# Patient Record
Sex: Female | Born: 1965 | Race: Black or African American | Hispanic: No | Marital: Single | Smoking: Never smoker
Health system: Southern US, Community
[De-identification: ages and names within clinical notes are randomized; demographics above are authoritative.]

## PROBLEM LIST (undated history)

## (undated) DIAGNOSIS — Z9289 Personal history of other medical treatment: Secondary | ICD-10-CM

## (undated) DIAGNOSIS — G4730 Sleep apnea, unspecified: Secondary | ICD-10-CM

## (undated) DIAGNOSIS — M1990 Unspecified osteoarthritis, unspecified site: Secondary | ICD-10-CM

## (undated) DIAGNOSIS — R55 Syncope and collapse: Secondary | ICD-10-CM

## (undated) DIAGNOSIS — K219 Gastro-esophageal reflux disease without esophagitis: Secondary | ICD-10-CM

## (undated) DIAGNOSIS — I10 Essential (primary) hypertension: Secondary | ICD-10-CM

## (undated) DIAGNOSIS — J45909 Unspecified asthma, uncomplicated: Secondary | ICD-10-CM

## (undated) HISTORY — DX: Unspecified osteoarthritis, unspecified site: M19.90

## (undated) HISTORY — PX: CARDIAC CATHETERIZATION: SHX172

## (undated) HISTORY — DX: Syncope and collapse: R55

## (undated) HISTORY — PX: CHOLECYSTECTOMY: SHX55

---

## 1999-01-26 ENCOUNTER — Emergency Department (HOSPITAL_COMMUNITY): Admission: EM | Admit: 1999-01-26 | Discharge: 1999-01-26 | Payer: Self-pay | Admitting: Emergency Medicine

## 1999-10-11 HISTORY — PX: VAGINAL HYSTERECTOMY: SUR661

## 2000-09-13 ENCOUNTER — Encounter: Payer: Self-pay | Admitting: Internal Medicine

## 2000-09-13 ENCOUNTER — Encounter: Admission: RE | Admit: 2000-09-13 | Discharge: 2000-09-13 | Payer: Self-pay | Admitting: Internal Medicine

## 2000-09-22 ENCOUNTER — Encounter (HOSPITAL_BASED_OUTPATIENT_CLINIC_OR_DEPARTMENT_OTHER): Payer: Self-pay | Admitting: General Surgery

## 2000-09-25 ENCOUNTER — Encounter (INDEPENDENT_AMBULATORY_CARE_PROVIDER_SITE_OTHER): Payer: Self-pay | Admitting: *Deleted

## 2000-09-25 ENCOUNTER — Encounter (HOSPITAL_BASED_OUTPATIENT_CLINIC_OR_DEPARTMENT_OTHER): Payer: Self-pay | Admitting: General Surgery

## 2000-09-25 ENCOUNTER — Ambulatory Visit (HOSPITAL_COMMUNITY): Admission: RE | Admit: 2000-09-25 | Discharge: 2000-09-26 | Payer: Self-pay | Admitting: General Surgery

## 2000-11-06 ENCOUNTER — Ambulatory Visit (HOSPITAL_COMMUNITY): Admission: RE | Admit: 2000-11-06 | Discharge: 2000-11-06 | Payer: Self-pay | Admitting: Internal Medicine

## 2000-12-15 ENCOUNTER — Encounter: Admission: RE | Admit: 2000-12-15 | Discharge: 2001-03-15 | Payer: Self-pay | Admitting: Internal Medicine

## 2001-07-04 ENCOUNTER — Emergency Department (HOSPITAL_COMMUNITY): Admission: EM | Admit: 2001-07-04 | Discharge: 2001-07-04 | Payer: Self-pay

## 2001-07-04 ENCOUNTER — Ambulatory Visit (HOSPITAL_COMMUNITY): Admission: RE | Admit: 2001-07-04 | Discharge: 2001-07-04 | Payer: Self-pay | Admitting: Internal Medicine

## 2001-07-04 ENCOUNTER — Encounter: Payer: Self-pay | Admitting: Internal Medicine

## 2001-07-06 ENCOUNTER — Encounter: Payer: Self-pay | Admitting: Internal Medicine

## 2001-07-06 ENCOUNTER — Ambulatory Visit (HOSPITAL_COMMUNITY): Admission: RE | Admit: 2001-07-06 | Discharge: 2001-07-06 | Payer: Self-pay | Admitting: Internal Medicine

## 2001-07-27 ENCOUNTER — Encounter: Payer: Self-pay | Admitting: Obstetrics and Gynecology

## 2001-07-27 ENCOUNTER — Ambulatory Visit (HOSPITAL_COMMUNITY): Admission: RE | Admit: 2001-07-27 | Discharge: 2001-07-27 | Payer: Self-pay | Admitting: Obstetrics and Gynecology

## 2001-09-09 HISTORY — PX: EXPLORATORY LAPAROTOMY: SUR591

## 2001-09-21 ENCOUNTER — Inpatient Hospital Stay (HOSPITAL_COMMUNITY): Admission: AD | Admit: 2001-09-21 | Discharge: 2001-09-26 | Payer: Self-pay | Admitting: Obstetrics and Gynecology

## 2001-09-21 ENCOUNTER — Encounter (INDEPENDENT_AMBULATORY_CARE_PROVIDER_SITE_OTHER): Payer: Self-pay

## 2001-10-06 ENCOUNTER — Emergency Department (HOSPITAL_COMMUNITY): Admission: EM | Admit: 2001-10-06 | Discharge: 2001-10-06 | Payer: Self-pay | Admitting: Emergency Medicine

## 2001-11-19 ENCOUNTER — Other Ambulatory Visit: Admission: RE | Admit: 2001-11-19 | Discharge: 2001-11-19 | Payer: Self-pay | Admitting: Obstetrics and Gynecology

## 2001-12-26 ENCOUNTER — Encounter: Payer: Self-pay | Admitting: General Surgery

## 2001-12-31 ENCOUNTER — Ambulatory Visit (HOSPITAL_COMMUNITY): Admission: RE | Admit: 2001-12-31 | Discharge: 2001-12-31 | Payer: Self-pay | Admitting: General Surgery

## 2002-02-05 ENCOUNTER — Ambulatory Visit (HOSPITAL_COMMUNITY): Admission: RE | Admit: 2002-02-05 | Discharge: 2002-02-05 | Payer: Self-pay | Admitting: Internal Medicine

## 2002-02-19 ENCOUNTER — Ambulatory Visit (HOSPITAL_COMMUNITY): Admission: RE | Admit: 2002-02-19 | Discharge: 2002-02-19 | Payer: Self-pay | Admitting: Obstetrics and Gynecology

## 2002-02-19 ENCOUNTER — Encounter: Payer: Self-pay | Admitting: Obstetrics and Gynecology

## 2002-04-05 ENCOUNTER — Encounter: Payer: Self-pay | Admitting: Obstetrics and Gynecology

## 2002-04-05 ENCOUNTER — Ambulatory Visit (HOSPITAL_COMMUNITY): Admission: RE | Admit: 2002-04-05 | Discharge: 2002-04-05 | Payer: Self-pay | Admitting: Obstetrics and Gynecology

## 2002-04-19 ENCOUNTER — Emergency Department (HOSPITAL_COMMUNITY): Admission: EM | Admit: 2002-04-19 | Discharge: 2002-04-19 | Payer: Self-pay | Admitting: Emergency Medicine

## 2002-05-16 ENCOUNTER — Encounter: Payer: Self-pay | Admitting: Obstetrics and Gynecology

## 2002-05-16 ENCOUNTER — Ambulatory Visit (HOSPITAL_COMMUNITY): Admission: RE | Admit: 2002-05-16 | Discharge: 2002-05-16 | Payer: Self-pay | Admitting: Obstetrics and Gynecology

## 2002-05-17 ENCOUNTER — Encounter: Payer: Self-pay | Admitting: Emergency Medicine

## 2002-05-17 ENCOUNTER — Emergency Department (HOSPITAL_COMMUNITY): Admission: EM | Admit: 2002-05-17 | Discharge: 2002-05-17 | Payer: Self-pay | Admitting: Emergency Medicine

## 2004-07-29 ENCOUNTER — Ambulatory Visit: Payer: Self-pay | Admitting: Internal Medicine

## 2004-09-09 ENCOUNTER — Ambulatory Visit: Payer: Self-pay | Admitting: Internal Medicine

## 2005-01-27 ENCOUNTER — Inpatient Hospital Stay: Payer: Self-pay | Admitting: Internal Medicine

## 2005-04-14 ENCOUNTER — Emergency Department: Payer: Self-pay | Admitting: Emergency Medicine

## 2005-04-27 ENCOUNTER — Ambulatory Visit: Payer: Self-pay | Admitting: Family Medicine

## 2005-08-12 ENCOUNTER — Ambulatory Visit: Payer: Self-pay | Admitting: Family Medicine

## 2005-09-15 ENCOUNTER — Ambulatory Visit: Payer: Self-pay | Admitting: Unknown Physician Specialty

## 2005-09-15 ENCOUNTER — Encounter: Admission: RE | Admit: 2005-09-15 | Discharge: 2005-09-15 | Payer: Self-pay | Admitting: General Surgery

## 2005-09-16 ENCOUNTER — Ambulatory Visit (HOSPITAL_COMMUNITY): Admission: RE | Admit: 2005-09-16 | Discharge: 2005-09-17 | Payer: Self-pay | Admitting: General Surgery

## 2005-09-16 ENCOUNTER — Ambulatory Visit (HOSPITAL_BASED_OUTPATIENT_CLINIC_OR_DEPARTMENT_OTHER): Admission: RE | Admit: 2005-09-16 | Discharge: 2005-09-16 | Payer: Self-pay | Admitting: General Surgery

## 2005-09-16 ENCOUNTER — Ambulatory Visit (HOSPITAL_COMMUNITY): Admission: RE | Admit: 2005-09-16 | Discharge: 2005-09-16 | Payer: Self-pay | Admitting: General Surgery

## 2006-04-08 ENCOUNTER — Ambulatory Visit: Payer: Self-pay

## 2006-05-11 ENCOUNTER — Ambulatory Visit: Payer: Self-pay | Admitting: General Surgery

## 2006-07-13 ENCOUNTER — Ambulatory Visit: Payer: Self-pay | Admitting: Orthopedic Surgery

## 2006-08-08 ENCOUNTER — Other Ambulatory Visit: Payer: Self-pay

## 2006-08-15 ENCOUNTER — Ambulatory Visit: Payer: Self-pay | Admitting: General Surgery

## 2006-11-08 ENCOUNTER — Emergency Department: Payer: Self-pay | Admitting: Emergency Medicine

## 2006-11-08 ENCOUNTER — Other Ambulatory Visit: Payer: Self-pay

## 2007-01-06 ENCOUNTER — Emergency Department: Payer: Self-pay | Admitting: General Practice

## 2007-02-24 ENCOUNTER — Emergency Department: Payer: Self-pay | Admitting: Emergency Medicine

## 2007-02-24 ENCOUNTER — Other Ambulatory Visit: Payer: Self-pay

## 2007-02-28 ENCOUNTER — Other Ambulatory Visit: Payer: Self-pay

## 2007-02-28 ENCOUNTER — Emergency Department: Payer: Self-pay | Admitting: Emergency Medicine

## 2007-06-12 ENCOUNTER — Ambulatory Visit: Payer: Self-pay | Admitting: Family Medicine

## 2007-06-25 ENCOUNTER — Emergency Department: Payer: Self-pay | Admitting: Emergency Medicine

## 2007-07-28 ENCOUNTER — Emergency Department: Payer: Self-pay | Admitting: Emergency Medicine

## 2007-12-25 ENCOUNTER — Emergency Department: Payer: Self-pay | Admitting: Emergency Medicine

## 2008-01-16 ENCOUNTER — Other Ambulatory Visit: Payer: Self-pay

## 2008-01-16 ENCOUNTER — Emergency Department: Payer: Self-pay | Admitting: Emergency Medicine

## 2008-02-26 ENCOUNTER — Other Ambulatory Visit: Payer: Self-pay

## 2008-02-26 ENCOUNTER — Emergency Department: Payer: Self-pay | Admitting: Emergency Medicine

## 2008-05-10 ENCOUNTER — Emergency Department: Payer: Self-pay | Admitting: Emergency Medicine

## 2008-06-02 ENCOUNTER — Emergency Department: Payer: Self-pay | Admitting: Emergency Medicine

## 2008-09-16 ENCOUNTER — Ambulatory Visit: Payer: Self-pay | Admitting: General Surgery

## 2008-10-16 ENCOUNTER — Inpatient Hospital Stay (HOSPITAL_COMMUNITY): Admission: EM | Admit: 2008-10-16 | Discharge: 2008-10-21 | Payer: Self-pay | Admitting: Emergency Medicine

## 2008-10-16 ENCOUNTER — Encounter (INDEPENDENT_AMBULATORY_CARE_PROVIDER_SITE_OTHER): Payer: Self-pay | Admitting: Surgery

## 2009-01-14 ENCOUNTER — Observation Stay: Payer: Self-pay | Admitting: Internal Medicine

## 2009-03-05 ENCOUNTER — Observation Stay: Payer: Self-pay | Admitting: Internal Medicine

## 2009-03-25 ENCOUNTER — Emergency Department: Payer: Self-pay | Admitting: Emergency Medicine

## 2009-03-29 ENCOUNTER — Emergency Department (HOSPITAL_COMMUNITY): Admission: EM | Admit: 2009-03-29 | Discharge: 2009-03-30 | Payer: Self-pay | Admitting: Emergency Medicine

## 2009-12-19 ENCOUNTER — Emergency Department (HOSPITAL_COMMUNITY): Admission: EM | Admit: 2009-12-19 | Discharge: 2009-12-19 | Payer: Self-pay | Admitting: Emergency Medicine

## 2010-02-03 ENCOUNTER — Emergency Department: Payer: Self-pay | Admitting: Emergency Medicine

## 2010-02-23 ENCOUNTER — Emergency Department: Payer: Self-pay | Admitting: Emergency Medicine

## 2010-03-22 ENCOUNTER — Ambulatory Visit: Payer: Self-pay | Admitting: Family Medicine

## 2010-03-23 ENCOUNTER — Ambulatory Visit: Payer: Self-pay | Admitting: Internal Medicine

## 2010-04-18 ENCOUNTER — Emergency Department (HOSPITAL_COMMUNITY): Admission: EM | Admit: 2010-04-18 | Discharge: 2010-04-18 | Payer: Self-pay | Admitting: Emergency Medicine

## 2010-05-24 ENCOUNTER — Emergency Department: Payer: Self-pay | Admitting: Emergency Medicine

## 2010-07-12 ENCOUNTER — Emergency Department: Payer: Self-pay | Admitting: Emergency Medicine

## 2010-07-14 ENCOUNTER — Emergency Department (HOSPITAL_COMMUNITY): Admission: EM | Admit: 2010-07-14 | Discharge: 2010-07-15 | Payer: Self-pay | Admitting: Emergency Medicine

## 2010-09-18 ENCOUNTER — Emergency Department: Payer: Self-pay | Admitting: Unknown Physician Specialty

## 2010-10-18 ENCOUNTER — Ambulatory Visit: Payer: Self-pay | Admitting: Internal Medicine

## 2010-10-26 ENCOUNTER — Emergency Department: Payer: Self-pay | Admitting: Internal Medicine

## 2010-10-27 ENCOUNTER — Emergency Department: Payer: Self-pay | Admitting: Emergency Medicine

## 2010-11-19 ENCOUNTER — Emergency Department: Payer: Self-pay | Admitting: Emergency Medicine

## 2010-12-22 LAB — COMPREHENSIVE METABOLIC PANEL
ALT: 23 U/L (ref 0–35)
AST: 19 U/L (ref 0–37)
Albumin: 3.5 g/dL (ref 3.5–5.2)
Alkaline Phosphatase: 64 U/L (ref 39–117)
BUN: 8 mg/dL (ref 6–23)
CO2: 28 mEq/L (ref 19–32)
Calcium: 9.3 mg/dL (ref 8.4–10.5)
Chloride: 112 mEq/L (ref 96–112)
Creatinine, Ser: 0.6 mg/dL (ref 0.4–1.2)
GFR calc Af Amer: >60 mL/min (ref >60)
GFR calc non Af Amer: >60 mL/min (ref >60)
Glucose, Bld: 85 mg/dL (ref 70–99)
Potassium: 3.2 mEq/L — ABNORMAL LOW (ref 3.5–5.1)
Sodium: 145 mEq/L (ref 135–145)
Total Bilirubin: 0.8 mg/dL (ref 0.3–1.2)
Total Protein: 6.5 g/dL (ref 6.0–8.3)

## 2010-12-22 LAB — URINE CULTURE
Colony Count: >=100000
Culture  Setup Time: 201110060159

## 2010-12-22 LAB — URINALYSIS, ROUTINE W REFLEX MICROSCOPIC
Bilirubin Urine: NEGATIVE
Glucose, UA: NEGATIVE mg/dL
Hgb urine dipstick: NEGATIVE
Ketones, ur: NEGATIVE mg/dL
Nitrite: NEGATIVE
Protein, ur: NEGATIVE mg/dL
Specific Gravity, Urine: 1.016 (ref 1.005–1.030)
Urobilinogen, UA: 1 mg/dL (ref 0.0–1.0)
pH: 6.5 (ref 5.0–8.0)

## 2010-12-22 LAB — DIFFERENTIAL
Basophils Absolute: 0 10*3/uL (ref 0.0–0.1)
Basophils Relative: 0 % (ref 0–1)
Eosinophils Absolute: 0 10*3/uL (ref 0.0–0.7)
Eosinophils Relative: 1 % (ref 0–5)
Lymphocytes Relative: 30 % (ref 12–46)
Lymphs Abs: 1.8 10*3/uL (ref 0.7–4.0)
Monocytes Absolute: 0.2 10*3/uL (ref 0.1–1.0)
Monocytes Relative: 3 % (ref 3–12)
Neutro Abs: 4 10*3/uL (ref 1.7–7.7)
Neutrophils Relative %: 66 % (ref 43–77)

## 2010-12-22 LAB — CBC
HCT: 36.6 % (ref 36.0–46.0)
Hemoglobin: 12.3 g/dL (ref 12.0–15.0)
MCH: 27.8 pg (ref 26.0–34.0)
MCHC: 33.6 g/dL (ref 30.0–36.0)
MCV: 82.8 fL (ref 78.0–100.0)
Platelets: 288 10*3/uL (ref 150–400)
RBC: 4.42 MIL/uL (ref 3.87–5.11)
RDW: 14.3 % (ref 11.5–15.5)
WBC: 6 10*3/uL (ref 4.0–10.5)

## 2010-12-22 LAB — LIPASE, BLOOD: Lipase: 20 U/L (ref 11–59)

## 2010-12-31 LAB — DIFFERENTIAL
Basophils Absolute: 0 10*3/uL (ref 0.0–0.1)
Basophils Relative: 0 % (ref 0–1)
Eosinophils Absolute: 0.1 10*3/uL (ref 0.0–0.7)
Eosinophils Relative: 1 % (ref 0–5)
Lymphocytes Relative: 24 % (ref 12–46)
Lymphs Abs: 1.4 10*3/uL (ref 0.7–4.0)
Monocytes Absolute: 0.4 10*3/uL (ref 0.1–1.0)
Monocytes Relative: 7 % (ref 3–12)
Neutro Abs: 3.9 10*3/uL (ref 1.7–7.7)
Neutrophils Relative %: 67 % (ref 43–77)

## 2010-12-31 LAB — COMPREHENSIVE METABOLIC PANEL
ALT: 21 U/L (ref 0–35)
AST: 17 U/L (ref 0–37)
Albumin: 3.7 g/dL (ref 3.5–5.2)
Alkaline Phosphatase: 75 U/L (ref 39–117)
BUN: 9 mg/dL (ref 6–23)
CO2: 26 mEq/L (ref 19–32)
Calcium: 9.3 mg/dL (ref 8.4–10.5)
Chloride: 107 mEq/L (ref 96–112)
Creatinine, Ser: 0.56 mg/dL (ref 0.4–1.2)
GFR calc Af Amer: >60 mL/min (ref >60)
GFR calc non Af Amer: >60 mL/min (ref >60)
Glucose, Bld: 81 mg/dL (ref 70–99)
Potassium: 3.3 mEq/L — ABNORMAL LOW (ref 3.5–5.1)
Sodium: 139 mEq/L (ref 135–145)
Total Bilirubin: 0.9 mg/dL (ref 0.3–1.2)
Total Protein: 7.1 g/dL (ref 6.0–8.3)

## 2010-12-31 LAB — CBC
HCT: 38.4 % (ref 36.0–46.0)
Hemoglobin: 12.9 g/dL (ref 12.0–15.0)
MCHC: 33.6 g/dL (ref 30.0–36.0)
MCV: 84 fL (ref 78.0–100.0)
Platelets: 269 10*3/uL (ref 150–400)
RBC: 4.57 MIL/uL (ref 3.87–5.11)
RDW: 13.7 % (ref 11.5–15.5)
WBC: 5.8 10*3/uL (ref 4.0–10.5)

## 2010-12-31 LAB — URINALYSIS, ROUTINE W REFLEX MICROSCOPIC
Bilirubin Urine: NEGATIVE
Glucose, UA: NEGATIVE mg/dL
Hgb urine dipstick: NEGATIVE
Ketones, ur: NEGATIVE mg/dL
Nitrite: NEGATIVE
Protein, ur: NEGATIVE mg/dL
Specific Gravity, Urine: 1.026 (ref 1.005–1.030)
Urobilinogen, UA: 0.2 mg/dL (ref 0.0–1.0)
pH: 5.5 (ref 5.0–8.0)

## 2011-01-17 LAB — COMPREHENSIVE METABOLIC PANEL
ALT: 20 U/L (ref 0–35)
AST: 17 U/L (ref 0–37)
Albumin: 3.5 g/dL (ref 3.5–5.2)
Alkaline Phosphatase: 59 U/L (ref 39–117)
BUN: 10 mg/dL (ref 6–23)
CO2: 26 mEq/L (ref 19–32)
Calcium: 8.9 mg/dL (ref 8.4–10.5)
Chloride: 108 mEq/L (ref 96–112)
Creatinine, Ser: 0.52 mg/dL (ref 0.4–1.2)
GFR calc Af Amer: >60 mL/min (ref >60)
GFR calc non Af Amer: >60 mL/min (ref >60)
Glucose, Bld: 101 mg/dL — ABNORMAL HIGH (ref 70–99)
Potassium: 2.8 mEq/L — ABNORMAL LOW (ref 3.5–5.1)
Sodium: 140 mEq/L (ref 135–145)
Total Bilirubin: 0.5 mg/dL (ref 0.3–1.2)
Total Protein: 6.6 g/dL (ref 6.0–8.3)

## 2011-01-17 LAB — POCT I-STAT, CHEM 8
BUN: 11 mg/dL (ref 6–23)
Calcium, Ion: 1.22 mmol/L (ref 1.12–1.32)
Chloride: 107 mEq/L (ref 96–112)
Creatinine, Ser: 0.7 mg/dL (ref 0.4–1.2)
Glucose, Bld: 98 mg/dL (ref 70–99)
HCT: 36 % (ref 36.0–46.0)
Hemoglobin: 12.2 g/dL (ref 12.0–15.0)
Potassium: 3 mEq/L — ABNORMAL LOW (ref 3.5–5.1)
Sodium: 144 mEq/L (ref 135–145)
TCO2: 25 mmol/L (ref 0–100)

## 2011-01-17 LAB — URINALYSIS, ROUTINE W REFLEX MICROSCOPIC
Bilirubin Urine: NEGATIVE
Glucose, UA: NEGATIVE mg/dL
Hgb urine dipstick: NEGATIVE
Ketones, ur: NEGATIVE mg/dL
Nitrite: NEGATIVE
Protein, ur: NEGATIVE mg/dL
Specific Gravity, Urine: 1.028 (ref 1.005–1.030)
Urobilinogen, UA: 0.2 mg/dL (ref 0.0–1.0)
pH: 5.5 (ref 5.0–8.0)

## 2011-01-17 LAB — URINE CULTURE
Colony Count: NO GROWTH
Culture: NO GROWTH

## 2011-01-17 LAB — LIPASE, BLOOD: Lipase: 16 U/L (ref 11–59)

## 2011-01-24 LAB — CREATININE, SERUM
Creatinine, Ser: 0.62 mg/dL (ref 0.4–1.2)
GFR calc Af Amer: >60 mL/min (ref >60)
GFR calc non Af Amer: >60 mL/min (ref >60)

## 2011-01-24 LAB — BASIC METABOLIC PANEL
BUN: 3 mg/dL — ABNORMAL LOW (ref 6–23)
CO2: 26 mEq/L (ref 19–32)
Calcium: 8.5 mg/dL (ref 8.4–10.5)
Chloride: 106 mEq/L (ref 96–112)
Creatinine, Ser: 0.45 mg/dL (ref 0.4–1.2)
GFR calc Af Amer: >60 mL/min (ref >60)
GFR calc non Af Amer: >60 mL/min (ref >60)
Glucose, Bld: 96 mg/dL (ref 70–99)
Potassium: 3.6 mEq/L (ref 3.5–5.1)
Sodium: 139 mEq/L (ref 135–145)

## 2011-01-24 LAB — POTASSIUM
Potassium: 3.3 mEq/L — ABNORMAL LOW (ref 3.5–5.1)
Potassium: 3.5 mEq/L (ref 3.5–5.1)

## 2011-01-24 LAB — CBC
HCT: 35.4 % — ABNORMAL LOW (ref 36.0–46.0)
HCT: 39.3 % (ref 36.0–46.0)
HCT: 39.6 % (ref 36.0–46.0)
HCT: 39.7 % (ref 36.0–46.0)
Hemoglobin: 11.7 g/dL — ABNORMAL LOW (ref 12.0–15.0)
Hemoglobin: 13 g/dL (ref 12.0–15.0)
Hemoglobin: 13 g/dL (ref 12.0–15.0)
Hemoglobin: 13 g/dL (ref 12.0–15.0)
MCHC: 32.8 g/dL (ref 30.0–36.0)
MCHC: 32.9 g/dL (ref 30.0–36.0)
MCHC: 33 g/dL (ref 30.0–36.0)
MCHC: 33.1 g/dL (ref 30.0–36.0)
MCV: 81.9 fL (ref 78.0–100.0)
MCV: 82.4 fL (ref 78.0–100.0)
MCV: 82.4 fL (ref 78.0–100.0)
MCV: 82.7 fL (ref 78.0–100.0)
Platelets: 292 10*3/uL (ref 150–400)
Platelets: 306 10*3/uL (ref 150–400)
Platelets: 317 10*3/uL (ref 150–400)
Platelets: 333 10*3/uL (ref 150–400)
RBC: 4.28 MIL/uL (ref 3.87–5.11)
RBC: 4.76 MIL/uL (ref 3.87–5.11)
RBC: 4.82 MIL/uL (ref 3.87–5.11)
RBC: 4.83 MIL/uL (ref 3.87–5.11)
RDW: 14.3 % (ref 11.5–15.5)
RDW: 14.3 % (ref 11.5–15.5)
RDW: 14.5 % (ref 11.5–15.5)
RDW: 15 % (ref 11.5–15.5)
WBC: 10 10*3/uL (ref 4.0–10.5)
WBC: 13.9 10*3/uL — ABNORMAL HIGH (ref 4.0–10.5)
WBC: 6.8 10*3/uL (ref 4.0–10.5)
WBC: 8.4 10*3/uL (ref 4.0–10.5)

## 2011-01-24 LAB — COMPREHENSIVE METABOLIC PANEL
ALT: 14 U/L (ref 0–35)
AST: 13 U/L (ref 0–37)
Albumin: 3.7 g/dL (ref 3.5–5.2)
Alkaline Phosphatase: 75 U/L (ref 39–117)
BUN: 8 mg/dL (ref 6–23)
CO2: 28 mEq/L (ref 19–32)
Calcium: 9.4 mg/dL (ref 8.4–10.5)
Chloride: 110 mEq/L (ref 96–112)
Creatinine, Ser: 0.54 mg/dL (ref 0.4–1.2)
GFR calc Af Amer: >60 mL/min (ref >60)
GFR calc non Af Amer: >60 mL/min (ref >60)
Glucose, Bld: 98 mg/dL (ref 70–99)
Potassium: 2.9 mEq/L — ABNORMAL LOW (ref 3.5–5.1)
Sodium: 145 mEq/L (ref 135–145)
Total Bilirubin: 0.6 mg/dL (ref 0.3–1.2)
Total Protein: 6.7 g/dL (ref 6.0–8.3)

## 2011-01-24 LAB — DIFFERENTIAL
Basophils Absolute: 0 10*3/uL (ref 0.0–0.1)
Basophils Relative: 0 % (ref 0–1)
Eosinophils Absolute: 0.1 10*3/uL (ref 0.0–0.7)
Eosinophils Relative: 1 % (ref 0–5)
Lymphocytes Relative: 24 % (ref 12–46)
Lymphs Abs: 2 10*3/uL (ref 0.7–4.0)
Monocytes Absolute: 0.4 10*3/uL (ref 0.1–1.0)
Monocytes Relative: 5 % (ref 3–12)
Neutro Abs: 5.8 10*3/uL (ref 1.7–7.7)
Neutrophils Relative %: 69 % (ref 43–77)

## 2011-01-24 LAB — URINALYSIS, ROUTINE W REFLEX MICROSCOPIC
Bilirubin Urine: NEGATIVE
Glucose, UA: NEGATIVE mg/dL
Hgb urine dipstick: NEGATIVE
Ketones, ur: NEGATIVE mg/dL
Nitrite: NEGATIVE
Protein, ur: NEGATIVE mg/dL
Specific Gravity, Urine: 1.022 (ref 1.005–1.030)
Urobilinogen, UA: 0.2 mg/dL (ref 0.0–1.0)
pH: 6 (ref 5.0–8.0)

## 2011-01-24 LAB — LIPASE, BLOOD: Lipase: 26 U/L (ref 11–59)

## 2011-02-05 ENCOUNTER — Emergency Department: Payer: Self-pay | Admitting: Internal Medicine

## 2011-02-22 NOTE — Discharge Summary (Signed)
[name], LAUGHERY NO.:  [id number]   MEDICAL RECORD NO.:  [id number]          PATIENT TYPE:  INP   LOCATION:  5123                         FACILITY:  [facility]   PHYSICIAN:  Juanetta Gosling, MDDATE OF BIRTH:  1966/08/09   DATE OF ADMISSION:  10/15/2008  DATE OF DISCHARGE:  10/21/2008                               DISCHARGE SUMMARY   ADMITTING PHYSICIAN:  Ardeth Sportsman, MD   DISCHARGING PHYSICIAN:  Troy Sine. Dwain Sarna, MD   CHIEF COMPLAINT/REASON FOR ADMISSION:  Ms. [name] is a 45 year old  female patient with history of asthma, hypertension, and GERD, who  developed acute abdominal pain 24 hours prior to presenting to the ER,  the pain progressively worsened and was associated with nausea and  vomiting, and inability to keep down food.  She also had significant  constipation and change in stool color and consistency.  In the ER, she  was evaluated and a CT scan was performed that demonstrated a 6- x 3-cm  small bowel mesenteric mass.  There was also small bowel dilatation  adjacent to the mass causing a focal small-bowel obstruction.  The  patient had no leukocytosis and no fever.  Because of her current  emesis, her potassium was down to 2.9.   CLINICAL EXAMINATION:  Her abdomen was soft, obese, and nondistended.  Mild mild tenderness in the supraumbilical and epigastric region without  guarding or peritoneal sounds.  She did have active bowel sounds  present.   ADMITTING DIAGNOSES:  1. Small bowel mesenteric mass.  2. Associated symptomatic small-bowel obstruction, focal.  3. Volume depletion.  4. Hypokalemia, secondary to nausea and vomiting.   HOSPITAL COURSE:  The patient was admitted to the general surgical floor  where she was placed on bowel rest, IV fluids for hydration, and  monitoring of electrolyte panel with associated repletion of potassium  via the IV route.   She was taken to the OR on the first hospital day by Dr. Michaell Cowing where  she  underwent a laparoscopic excision of small-bowel mesenteric masses x3.  Subsequent pathology revealed no evidence of malignancy with only  necrotic adipose tissue.   Throughout the remainder of the hospitalization, the patient's diet was  advanced slowly.  She had issues related to mild postoperative ileus,  but otherwise her incisions remained clean, dry, and intact, and no  signs of secondary infection.  By postop day #4, she had been tolerating  a clear liquid diet for 24 hours and her diet was advanced to full and  then to a soft regular diet and by postoperative day #5, she was  tolerating a soft diet as well as tolerating oral pain medications.  She  was afebrile, vital signs were stable.  She had two bowel movements the  past 24 hours.   Most recent lab work was obtained on postop day #4, with the white count  being 6800, hemoglobin 11.7, and platelets 292,000.  Sodium 139,  potassium 3.6, CO2 of 26, glucose 96, BUN 3, and creatinine 0.45.   On exam, on date of discharge postop day #5, the patient's abdomen was  soft and obese, dressings were removed.  The 2 lateral trocar sites on  the left did show some minor skin separation, Steri-Strips were applied.   FINAL DISCHARGE DIAGNOSES:  1. Abdominal pain and small bowel obstruction, secondary to small-      bowel mesenteric mass.  2. Status post laparoscopic resection of mesenteric mass with benign      pathology.   DISCHARGE MEDICATIONS:  The patient will resume the following home  medications.  1. Fexofenadine 180 mg daily.  2. Amlodipine 5 mg daily.  3. Omeprazole 20 mg daily.  4. Xopenex HFA 45 mcg actuation 1 inhalation b.i.d.   NEW MEDICATIONS:  Percocet 7.5/500 one tab every 6 hours as needed for  pain.   ADDITIONAL INSTRUCTIONS:  No restriction in diet.  Wound care allows  Steri-Strips to fall off.   ACTIVITY:  Increase activity slowly.  May walk up steps.  No lifting  greater than 15 pounds for 2 weeks.   No driving for 1 week.   FOLLOWUP APPOINTMENTS:  The patient is to follow up at the top of the  week, [facility] on November 04, 2008, at 2:15 p.m.  She is to arrive at 2 p.m.      Allison L. Marya Landry, MD  Electronically Signed    ALE/MEDQ  D:  10/21/2008  T:  10/21/2008  Job:  [id number]   cc:   Ardeth Sportsman, MD

## 2011-02-22 NOTE — Op Note (Signed)
[name], [name] NO.:  [id number]   MEDICAL RECORD NO.:  [id number]          PATIENT TYPE:  INP   LOCATION:  5123                         FACILITY:  [facility]   PHYSICIAN:  Ardeth Sportsman, MD     DATE OF BIRTH:  1965/12/06   DATE OF PROCEDURE:  DATE OF DISCHARGE:                               OPERATIVE REPORT   SURGEON:  Ardeth Sportsman, MD   ASSISTANT:  Ardeen Garland, MD, Family Practice.   PREOPERATIVE DIAGNOSIS:  Small bowel mass with nausea, vomiting, and  abdominal pain.   POSTOPERATIVE DIAGNOSES:  1. 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  2. No evidence of any other significant intraabdominal pathology.   PROCEDURES PERFORMED:  1. Diagnostic laparoscopy.  2. Laparoscopic assisted small bowel mesenteric excisional biopsies      x3.   ANESTHESIA:  1. General.  2. Local anesthetic and a field block around all port sites.   SPECIMENS:  Jejunal small bowel mesenteric nodules sent fresh in saline  per pathology protocol.   ESTIMATED BLOOD LOSS:  50 mL.   DRAINS:  None.   COMPLICATIONS:  None apparent.   INDICATIONS:  [name] is a 45 year old morbidly obese female with  intermittent abdominal pain and nausea and vomiting.  She had abdominal  CT scan to have a mesenteric mass about 6 x 7 cm in size concern for  possible carcinoid or other abnormality.  Given the abdominal pain,  recommendation was made for diagnostic laparoscopy with core biopsy of  this mass to determine if it is neoplastic in nature or not.  Given the  fact that it seemed to be close to the superior mesenteric artery and  initial resection did not seem to be wanted at this time.  Technique was  discussed.  Risks, benefits, and alternatives were discussed.  Questions  were answered and patient agreed to proceed.   OPERATIVE FINDINGS:  She had some shoddy small bowel mesenteric  ellipsoid nodules primarily in the jejunum about the size of small-to-  large jelly beans,  1 cm to 3 cm in the largest size, ellipsoid in  nature, primarily fatty and mildly firm, and not particularly discrete  or rocky hard.  There was no small bowel masses or abnormalities.  No  Meckel diverticulum.  Appendix was normal.  Cecum was normal.  Colon was  normal.  She is status post hysterectomy, status post cholecystectomy.  She did have some adhesions to her anterior abdominal wall for a prior  cholecystectomy with an umbilical hernia patch intact and no evidence of  any persistent hernia.   DESCRIPTION OF PROCEDURE:  Informed consent was confirmed.  The patient  received IV cephalexin just prior to surgery.  She was positioned  supine, both arms tucked.  She voided just prior to entering the  operating room.  She had sequential compression devices active during  the entire case.  She underwent general anesthesia without any  difficulty.  Her abdomen was prepped and draped in sterile fashion.   A final port was placed in the left upper quadrant with the patient in  steep reversed Trendelenburg and left side up using optical entry  technique without any difficulty.  A camera inspection revealed no intra-  abdominal injury.  Under direct visualization, the final ports were  placed in the left flank and left lower quadrant.  Camera inspection  revealed the findings noted above.  Initially, the greater omental  mesentery was densely adherent to her abdominal wall from the falciform  ligament down infra-umbilically.  These were freed off using sharp  dissection for laparoscopic lysis of adhesions about 15 minutes.  The  greater omentum and transverse colon reflected  all the way.   Small bowel was run from the ileocecal valve all the way to the ligament  of Treitz.  There was no discrete masses or any abnormalities.  Her  mesentery had some fatty lobulations within it, but no discrete huge  obvious mass.  I cannot definitely palpate anything either.   Therefore, I placed a gel  port through a periumbilical incision and  encountered an umbilical hernia patch and went through part of that.  I  was able to palpate with my hand and could feel the numerous jelly bean,  ellipsoid, slightly firm smooth nodules within the small bowel,  primarily the jejunal mesentery.  I could feel all the way down to the  base of the mesentery and retrocolic and retromesenteric area.  I could  feel a normal pancreas and normal kidneys as well.   I went and eviscerated the small bowel and ran small bowel from the fold  of Treitz of the cecal region all the way to the ligament of Treitz and  again I could not palpate any endoluminal masses within the small bowel  nor see any.   There were numerous small bowel mesenteric nodules.  I went on using  Metzenbaum scissors turned out three masses, two closer to the base and  one a little more distal and near about 1 cm to the mesenteric side of  the midjejunum.  I trimmed all these out fresh without using any cautery  at all.  The wounds were closed down using 3-0 silk stitches to good  results.  There was a little bit of mesenteric bleeder on the more  distal area, but after stitching, hemostasis was excellent.  The small  bowel was returned.  Greater omentum was on the outer layer of the  wound.  The wound was closed using a #1 loop PDS incorporating the prior  umbilical hernia patch back into its prior state.  Skin was closed using  4-0 Monocryl stitch.   Counts were up by three instruments from the initial count.  A KUB was  performed and Dr. Chilton Si with Radiology confirmed there was no foreign  bodies within the area.  The patient was extubated and sent to the  recovery room in stable condition.   I explained the operating findings to the patient's friends per her  request.      Ardeth Sportsman, MD  Electronically Signed     SCG/MEDQ  D:  10/16/2008  T:  10/17/2008  Job:  [id number]

## 2011-02-22 NOTE — H&P (Signed)
[name], [name] NO.:  [id number]   MEDICAL RECORD NO.:  [id number]          PATIENT TYPE:  INP   LOCATION:  5123                         FACILITY:  [facility]   PHYSICIAN:  Ardeth Sportsman, MD     DATE OF BIRTH:  Nov 28, 1965   DATE OF ADMISSION:  10/15/2008  DATE OF DISCHARGE:                              HISTORY & PHYSICAL   TIME OF ADMISSION:  09:00   The patient does not have a primary care physician.   CHIEF COMPLAINT:  Abdominal pain.   HISTORY OF PRESENT ILLNESS:  [name] is a 45 year old black female  with a history of asthma, hypertension, and gastric reflux, who began  having an acute onset of abdominal pain yesterday.  She states that as  the day progressed her pain progressively worsened as well.  She  describes her pain as pulling.  As the day progressed, the patient  began to develop nausea and had approximately 4 episodes of emesis at  home.  She states that no episodes of emesis had blood in them.  She  also states the last time she ate was approximately 12 o'clock yesterday  afternoon for lunch.  However, she states that she threw this up as  well.  The patient also states that her bowel movement has become less  regular over the past several weeks.  She states that they are now  extremely hard and appeared to come out as little balls.  She also  states that the color of her stool changes from day-to-day basis between  brown, green, and black.  However, she states that she has never seen  any blood in her stool.  Apparently prior to her stool being very hard,  she had a bowel movement everyday, which was of normal consistency.  Due  to her severity of pain, she decided to present to the emergency  department and have this checked out.  Upon admission, a CT scan of the  abdomen and pelvis was obtained, which showed an approximately 6 x 3 cm  small bowel mesenteric mass.  It also shows adjacent to this mass there  is some small bowel  dilatation causing focal obstructive type  appearance.  Also at this time, all of her lab values are essentially  normal except for the patient has a potassium of 2.9.  Because of the  patient's mesenteric mass, we were called and asked to see the patient.   REVIEW OF SYSTEMS:  Please see HPI.  Otherwise, the patient denies any  chest pain, shortness of breath, edema in her right feet.  She admits to  more progressive constipation over the past several weeks; however, no  blood has been seen in her stool.  She also admits to nausea and  vomiting with no blood within them.  All other systems are negative.   FAMILY HISTORY:  Negative for any types of cancer.   PAST MEDICAL HISTORY:  1. Hypertension.  2. Asthma.  3. Gastric reflux.   PAST SURGICAL HISTORY:  1. Status post laparoscopic cholecystectomy.  2. Status post abdominal laparoscopy.  3.  Ovarian cyst.  4. Status post abdominal hysterectomy.   SOCIAL HISTORY:  The patient is single.  She lives by herself.  She has  2 sons, ages 79 and 1 who apparently are out of state in college;  however, she was not sure, which college they both attended.  She does  not use tobacco or alcohol.  She does not work either.   ALLERGIES:  NKDA.   MEDICATIONS:  1. Omeprazole 20 mg p.o. daily.  2. Norvasc 5 mg p.o. daily.  3. Albuterol 2 puffs q.a.m.   PHYSICAL EXAMINATION:  GENERAL:  [name] is a very pleasant, well-  developed, well-nourished, somewhat obese 45 year old black female who  is currently lying in bed in no acute distress.  VITAL SIGNS:  Temperature 98.1, pulse 70, respirations 20, and blood  pressure 107/62.  HEENT:  Head is normocephalic and atraumatic.  Sclera noninjected.  Pupils equal, round, and reactive to light.  Ears and nose without any  obvious masses or lesions.  Mouth is pink but somewhat dry secondary to  dehydration.  Throat shows no exudate.  Currently, the patient is  wearing glasses.  NECK:  Supple.   Trachea is midline.  No thyromegaly.  HEART:  Regular rate and rhythm.  She does have a 3/6 systolic murmur,  otherwise no gallops or rubs are noted.  She had +2 carotid, radial, and  pedal pulses bilaterally.  LUNGS:  Clear to auscultation bilaterally with no wheezes, rhonchi, or  rales noted.  Respiratory effort is nonlabored.  Chest is symmetrical.  ABDOMEN:  Soft, obese, nondistended.  She does have some noted  tenderness in the supraumbilical and epigastric region.  She does not  have any guarding or peritoneal signs.  She does have active bowel  sounds with no other masses or hernias noted.  MUSCULOSKELETAL:  All 4 extremities were symmetrical.  No cyanosis,  clubbing, or edema.  SKIN:  Warm and dry without any obvious masses, lesions, or rashes.  NEUROLOGIC:  Cranial nerves II through XII appeared to be grossly  intact.  Deep tendon reflex exam is deferred at this time.  PSYCH:  The patient is alert and oriented x3 with an appropriate affect.   LABORATORY DATA AND DIAGNOSTICS:  White blood cell count 8400,  hemoglobin 13.0, hematocrit 39.7, platelet count is 317, neutrophils are  69%.  Sodium 145, potassium 2.9, chloride 110, carbon dioxide 28,  glucose 98, BUN 8, creatinine 0.54.  CT of the abdomen and pelvis reveal  an ill-defined soft tissue density at the base of the small bowel  mesentery, which raises concern for a carcinoid tumor.  This measures  6.3 x 3.2 cm.  No other focal lesions are noted to suggest metastatic or  primary disease.  There is also some slight diltation of the small bowel  adjacent to the suspected tumor, which is resulting from a relative  obstruction; however, contrast really passes through the small bowel.  Otherwise, the pelvis is essentially normal.   IMPRESSION:  1. Small bowel mesenteric mass.  2. Some relative obstructive symptoms secondary to small bowel      mesenteric mass.  3. Hypokalemia.  4. Dehydration.  5. History of hypertension.   6. History of asthma.  7. History of gastric reflux.   PLAN:  At this time, we plan on admitting with morphine and taking her  to the OR later today for a diagnostic laparoscopy with biopsy of this  mass.  Therefore, at this time, we will  keep the patient n.p.o. and we  will begin repleting her potassium prior to surgical intervention.  We  will also start her on IV fluids as well as various p.r.n. medications  as needed for nausea or pain.  We will also give her cefoxitin 2 g on-  call to the OR.  We will also start some of her home medicines including  her albuterol.  We will start Protonix instead of omeprazole.  Currently, we are going to hold her Norvasc as her blood pressure is  currently 107/62.  Otherwise, the patient has been explained the  procedure along with some of the risks and benefits and at this time,  she agrees to proceed.      Letha Cape, PA      Ardeth Sportsman, MD  Electronically Signed    KEO/MEDQ  D:  10/16/2008  T:  10/16/2008  Job:  [id number]

## 2011-02-25 NOTE — Discharge Summary (Signed)
[facility]  Patient:    [name], [name] Visit Number: [id number] MRN: [id number]          Service Type: GYN Location: [id number] Attending Physician:  Jaymes Graff A Dictated by:   Pierre Bali Normand Sloop, M.D. Admit Date:  09/21/2001 Discharge Date: 09/26/2001                             Discharge Summary  DIAGNOSES: 1. Right hydrosalpinx. 2. Pelvic adhesions. 3. Rectal laceration repair with febrile morbidity.  PROCEDURE:  Diagnostic laparoscopy, lysis of adhesions, right salpingectomy, partial right oophorectomy, exploratory laparotomy, repair of rectal laceration, and repair of serosal bleb on large bowel.  DISCHARGE MEDICATIONS: 1. Flagyl one tablet b.i.d. x 10 days. 2. Tequin one tablet q.d. x 10 days. 3. Colace one tablet b.i.d. 4. Albuterol inhaler 2 puffs q.4-6h. p.r.n. 5. Percocet one or two tablets q.6h. p.r.n. pain.  ACTIVITY:  Pelvic rest, nothing in the vagina, no tampons, no douching, no sex.  No heavy lifting greater than 10 pounds.  DIET:  The patient was told to avoid peanuts, popcorn.  WOUND CARE:  Keep area clean and dry.  The patient is to call for any nausea or vomiting, severe abdominal pain, or temperature greater than 100.  FOLLOWUP: 1. Dr. Johna Sheriff in 2 to 3 weeks. 2. Follow up with me on Monday, October 01, 2001, for staple removal.  The patient underwent diagnostic laparoscopy outpatient procedure, but ended up having exploratory laparotomy secondary to a rectal laceration which was repaired by general surgery and myself.  Surgeon was Dr. Johna Sheriff, I assisted.  HOSPITAL COURSE:  On postoperative day #0 and postoperative day #1, the patient had febrile morbidity with a temperature max of 102.9.  Physical examination was consistent with atelectasis.  However, did have decreased bowel sounds with slow bowel recovery.  She had an NG tube in place which was removed on postoperative day #2.  The patient slowly  was able to tolerate liquids, solid food, and had complete bowel recovery without any complications.  The patient never had any nausea or vomiting.  To improve atelectasis, the patient was given incentive spirometer and encouraged to ambulate.  The patients potassium was noted to be 3.0, and it was replaced with her IV fluids, and p.o. with K-Dur.  The patient was febrile, and had a white count as high as 16.  She then became afebrile for greater than 48 hours on Flagyl and Cefotan, and her white count began to decrease.  On postoperative day #3, the patient began to have flatus, and tolerated a full liquid diet.  On postoperative day #4, she had been afebrile for greater than 48 hours, had good bowel sounds, nontender, incisions were clean, dry, and intact, was tolerating a regular diet and having bowel movements.  The patient will be discharged in the morning with the medications as listed above.  FOLLOWUP: 1. Follow up in my office on 10/01/01, for staple removal. 2. Dr. Johna Sheriff in 2 to 3 weeks. Dictated by:   Pierre Bali. Normand Sloop, M.D. Attending Physician:  Michael Litter DD:  09/25/01 TD:  09/26/01 Job: [id number]

## 2011-02-25 NOTE — Op Note (Signed)
[facility]  Patient:    [name], [name] Visit Number: [id number] MRN: [id number]          Service Type: GYN Location: 9300 [id number] Attending Physician:  Jaymes Graff A Dictated by:   Pierre Bali Normand Sloop, M.D. Admit Date:  09/21/2001                             Operative Report  PREOPERATIVE DIAGNOSES:       Bilateral ovarian cysts with pelvic pain.  POSTOPERATIVE DIAGNOSES:      Right hydrosalpinx, left ovarian resolving corpus luteum cyst, two sigmoid serosal rents, rectal laceration, adhesions.  PROCEDURE:                    Diagnostic laparoscopy, lysis of adhesions, right salpingectomy, right partial oophorectomy, exploratory laparotomy, repair or rectal laceration, repair of serosal rents in large bowel.  SURGEON:                      Naima A. Normand Sloop, M.D.  ASSISTANT:                    Janine Limbo, M.D., Lorne Skeens. Hoxworth, M.D.  ESTIMATED BLOOD LOSS:         Minimal.  INTRAVENOUS FLUIDS:           Crystalloid 3300 cc with antibiotics.  URINE OUTPUT:                 300 cc clear urine.  COMPLICATIONS:                Rectal laceration.  FINDINGS:                     Ovaries and tubes adherent to rectum and cul-de-sac, multiple adhesions between them.  Patient had a normal appendix and normal abdominal anatomy.  Patient went to recovery room in stable condition.  Will monitor closely.  PROCEDURE IN DETAIL:          The patient was taken to the operating room where she was given general anesthesia, placed in the dorsal lithotomy position, and prepped and draped in a normal sterile fashion.  Her examination under anesthesia was very much benign.  Patient has no cervix or uterus secondary to a hysterectomy which was probably vaginal due to no incision on her belly.  A 10 mm infraumbilical incision was then made with a knife.  The Veress needle was placed into the intra-abdominal cavity at a 45 degree angle while tenting the  abdominal wall.  Intra-abdominal placement was confirmed by fluid filled syringe and decrease in CO2 pressure.  The abdomen was filled with 3 L CO2 gas.  The 10 mm trocar was placed without difficulty.  The laparoscope was placed and the findings noted above were seen.  A left suprapubic 5 mm incision was then made with a knife 2 cm above the symphysis pubis.  The 5 mm trocar was placed with direct visualization.  The probe was then used to move the bowel from the cul-de-sac.  However, the rectum and ovaries were just adherent to one another.  Both ovaries and tubes were adherent to one another in the cul-de-sac and the rectum was adherent to the ovaries.  A second 5 mm port was placed in the right lower quadrant and with sharp and blunt dissection the ovaries and tubes were separated from  the rectum.  At this point there was noted to be a rent in the rectum which we realized needed suturing with some small bleeding, no stool seen.  The place was irrigated and some Surgicel was placed on that area and it was noted to be hemostatic.  The patient was noted to have a right hydrosalpinx which was drained with had clear fluid and at this point the tubes and ovaries were separated from one another along the plane of adherence using bipolar cautery and endo shears.  Hemostasis was assured.  The right hydrosalpinx and part of the right ovary were removed using endo loops with normal procedure.  Two endo loops were placed and the area was cut and then taken out of the large portion of the laparoscope without difficulty.  Hemostasis was assured.  Attention was then turned to the rent in the rectum and a needle was placed into the 5 mm port.  An attempt to close the serosa rent was attempted; however, the suture broke away from the needle.  At this point general surgeons were called in to take a look at the rent.  Again, the area was irrigated and at that time we noted some stool on the irrigation  port.  At this time we decided to do a laparotomy.  A Pfannenstiel skin incision was made 2 cm above the symphysis pubis and carried down to the fascia.  The fascia was then incised in the midline and extended bilaterally.  The rectus muscles were separated in the midline.  Peritoneum was identified, tented up, and entered.  The peritoneum was extended superiorly and inferiorly with good visualization of bowel and bladder.  Balfour retractor was then placed into the abdominal cavity. Copious irrigation was done.  The bowel was packed away with moist laparotomy sponges.  A bladder blade was placed and we were able to see the rectal rent. Each area of the rectal rent was then grasped with Babcock clamps.  Dr. Johna Sheriff was then available to help [place] and he sewed the rent back together using 3-0 silk in an interrupted fashion.  The area was then irrigated copiously with saline.  Dr. Johna Sheriff did a rectal sigmoidoscopy so we could see any air extrude from any of the area.  There were two places in which 3-0 silk was placed to close the openings.  Again, air was placed into the rectum. No bubbling in the saline was seen in the abdomen.  The air was let out of the rectum by Dr. Johna Sheriff.  The abdomen was irrigated with copious saline. Patient was given antibiotics 2 g Cefotetan, 500 Flagyl.  At this time the infraumbilical fascia was closed with 0 Vicryl and a GU needle.  The skin was closed with 3-0 Vicryl in a subcuticular fashion.  The Pfannenstiel skin incision was closed with 0 Vicryl in a running fashion.  Hemostasis was assured.  Before that was closed the rectus muscles were brought back together with a bit of peritoneum on both sides using 0 Vicryl with a figure-of-eight stitch.  Then the fascia was closed with 0 Vicryl in a running locked fashion. The subcutaneous tissue was irrigated copiously with normal saline.  The bleeding areas were made hemostatic with Bovie cautery.  The skin was  closed with staples.  Sponge, lap, and needle counts were correct x 2.  Patient went to recovery room in stable condition. Dictated by:   Pierre Bali. Normand Sloop, M.D.  Attending Physician:  Michael Litter DD:  09/21/01 TD:  09/21/01 Job: [id number]

## 2011-02-25 NOTE — H&P (Signed)
[facility]  Patient:    [name], [name] Visit Number: [id number] MRN: [id number]          Service Type: Attending:  Naima A. Normand Sloop, M.D. Dictated by:   Pierre Bali. Normand Sloop, M.D.                           History and Physical  HISTORY OF PRESENT ILLNESS:   The patient is a 45 year old African-American female, G3, P4, whose last menstrual period was last year after a hysterectomy.  The patient presented to the office on July 30, 2001 complaining of some abdominal pelvic pain for 2-3 weeks in the left lower quadrant.  She did not have any nausea and vomiting, any vaginal discharge, any dysuria, frequency or hematuria.  Denies any history of kidney stones, constipation or rectal bleeding, nausea, vomiting.  The patient did report that she did have an ovarian cyst on ultrasound on July 06, 2001, which showed a right ovarian cyst 3.9 x 4.9 x 3.6 and a left adnexal complex cyst with echogenic debris measuring 2.5 x 2.2 x 2.7.  PAST MEDICAL HISTORY:         Significant for hypertension and asthma.  The patient was taking some medicine for the pain, but did not remember what the name was.  PAST GYNECOLOGIC HISTORY:     The patient denies any history of sexually transmitted diseases or abnormal Pap smear.  The patient had full-term vaginal deliveries x 3 without any complications.  FAMILY HISTORY:               Significant for high blood pressure, no GYN CA.  SOCIAL HISTORY:               Negative x 3.  PHYSICAL EXAMINATION:  GENERAL:                      On August 09, 2001, the patient weighed 241 pounds.  VITAL SIGNS:                  Blood pressure was 120/80.  ABDOMEN:                      She has a mild lower abdominal tenderness bilaterally, no rebound, tenderness, no guarding.  PELVIC EXAMINATION:           Vulva, vaginal glands within normal limits. Cervix and uterus are not applicable.  Adnexa - she had mild  bilateral tenderness.  Repeat ultrasound that follow up the cysts that was done on July 27, 2001 showed a decreasing size of the complex cyst of the left ovary, stable size of the simple-appearing cyst on the right adnexa measuring 4.8 cm in the greatest diameter.  Questionable hydrosalpinx and new complex 3.7 cm cystic lesion in the right adnexa adjacent to the large simple-appearing cyst.  The patient said, that she was in pain and did not want to follow up and just get another ultrasound in another 8 weeks.  She preferred to have diagnostic laparoscopy, possible ovarian cystectomy, possible lysis of adhesions, possible salpingo-oophorectomy.  The patient states, that she understands the risks and benefits are, but not limited to, reaction to anesthesia, bleeding, infection, damage to internal organs, such as bowel, bladder, major blood vessels, ureters. Dictated by:   Pierre Bali. Normand Sloop, M.D. Attending:  Naima A. Dillard, M.D. DD:  09/20/01 TD:  09/20/01 Job: [id number]

## 2011-02-25 NOTE — Op Note (Signed)
[place]  Patient:    [name], [name]                       MRN: [id number] Proc. Date: 09/25/00 Adm. Date:  [id number] Attending:  Sonda Primes                           Operative Report  PREOPERATIVE DIAGNOSIS:  Chronic calculous cholecystitis.  POSTOPERATIVE DIAGNOSIS:  Chronic calculous cholecystitis.  PROCEDURE:  Laparoscopic cholecystectomy with intraoperative cholangiogram.  SURGEON:  Luisa Hart L. Lurene Shadow, M.D.  ASSISTANT:  Magnus Ivan, R.N.F.A.  ANESTHESIA:  General.  INDICATION:  The patient is a 45 year old woman presenting with right upper quadrant pain with radiation to the right subscapular region.  No fever or jaundice.  She experiences excruciating and increasing pain on eating and has persistent pain otherwise.  She underwent gallbladder ultrasonography, which shows cholelithiasis.  Liver function studies are within normal limits.  No increased amylase or lipase.  She is brought to the operating room now for a laparoscopic cholecystectomy.  DESCRIPTION OF PROCEDURE:  Following the induction of satisfactory anesthesia with the patient positioned supinely, the abdomen was prepped and draped to be included in the sterile operative field.  An open laparoscopy created at the umbilicus with insertion of a Hasson cannula and insufflation of the peritoneal cavity to 14 mmHg pressure using carbon dioxide.  The camera was inserted and visual examination of the abdomen carried out.  The liver edge is sharp, and the surface is smooth.  Gallbladder was somewhat chronically scarred.  There were no adhesions of the duodenum to the gallbladder.  None of the small or large intestine appeared to be abnormal.  The other pelvic organs were not visualized.  Under direct vision, epigastric and lateral ports were placed.  The gallbladder was grasped and retracted cephalad and the dissection carried down by the ampulla of the  gallbladder to isolate the cystic artery and cystic duct.  The cystic artery was traced up to its entry into the gallbladder wall and was then doubly clipped and transected.  The cystic duct was traced up to the gallbladder-cystic duct junction and was cut proximally.  The cystic duct was opened, and a cystic duct cholangiogram was carried out by placing a Reddick catheter into the abdomen using a 14-gauge Angiocath.  I injected one-half strength Hypaque into the biliary system.  There was prompt flow of contrast into the duodenum with no filling defects and a normal-caliber common bile duct and hepatic duct.  No filling defects were seen.  The cystic duct catheter was removed, and the cystic duct was then clipped, doubly clipped and then transected.  The gallbladder was then dissected free from the liver bed and maintaining hemostasis throughout the course of the dissection using electrocautery.  At the end of the dissection, the gallbladder bed was checked for hemostasis, additional bleeding points treated with electrocautery.  The camera was then moved to the epigastric port, and the gallbladder was retrieved through the umbilical port using an Endocatch. The pneumoperitoneum was allowed to deflate after the sponge, instrument, and sharp counts were verified, and the upper trocar was removed under direct vision.  The wounds were then closed as follows:  The umbilical wound closed with 0 Dexon and Dermabond, the epigastric and lateral _____ were closed with Dermabond.  The patient was then removed from the operating room to the recovery room in stable condition,  having tolerated the procedure well. DD:  09/25/00 TD:  09/26/00 Job: [id number]

## 2011-02-25 NOTE — Op Note (Signed)
[facility]  Patient:    [name], [name] Visit Number: [id number] MRN: [id number]          Service Type: GYN Location: 9300 [id number] Attending Physician:  Michael Litter Dictated by:   Lorne Skeens. Hoxworth, M.D. Admit Date:  09/21/2001                             Operative Report  PREOPERATIVE DIAGNOSES:       Injury to rectosigmoid.  PREOPERATIVE DIAGNOSES:       Injury to rectosigmoid.  PROCEDURE:                    Suture repair of rectosigmoid colon.  SURGEON:                      Lorne Skeens. Hoxworth, M.D.  ASSISTANTSamule Ohm A. Dillard, M.D.  BRIEF HISTORY:                I was asked by Dr. Normand Sloop intraoperatively to evaluate Rhae Lerner.  She was undergoing a salpingo-oophorectomy with very dense pelvic adhesions from previous surgery.  During the procedure Dr. Normand Sloop recognized an injury to the rectosigmoid colon and requested intraoperative evaluation.  Examination of the pelvis revealed two small less than 1 cm defects, full thickness, in the rectosigmoid above the peritoneal reflection anteriorly. There was a small bridge of tissue between these.  There was minimal, if any, contamination.  The patient had not had a bowel prep, but had received antibiotics.  The two adjacent defects were combined, excising the ______ bridge of tissue between then which left a longitudinal defect anteriorly of about 1.5-2 cm.  The tissue edges appeared healthy.  The distal extent of the injury could be well visualized above the peritoneal reflection.  I would like to close this primarily.  This was accomplished with interrupted full thickness 2-0 silk sutures.  Following completion of the repair, I performed a rigid sigmoidoscopy with air insufflation of the rectosigmoid.  Two small areas of air leak were seen from the suture line.  Each of these were sutured with an additional 2-0 silk and following this with significant air  pressure from the sigmoidoscope there was no evidence of any leakage.  The pelvis was copiously irrigated and Dr. Normand Sloop and Dr. Stefano Gaul proceeded with closure. Dictated by:   Lorne Skeens. Hoxworth, M.D. Attending Physician:  Michael Litter DD:  09/21/01 TD:  09/21/01 Job: [id number]

## 2011-02-25 NOTE — Op Note (Signed)
NAMEHIRA, [name]              ACCOUNT NO.:  [id number]   MEDICAL RECORD NO.:  [id number]          PATIENT TYPE:  AMB   LOCATION:  DSC                          FACILITY:  [facility]   PHYSICIAN:  Sharlet Salina T. Hoxworth, M.D.DATE OF BIRTH:  07/03/1966   DATE OF PROCEDURE:  09/16/2005  DATE OF DISCHARGE:                                 OPERATIVE REPORT   PRE-AND-POSTOPERATIVE DIAGNOSIS:  Anal pain, probable anal fissure.   SURGICAL PROCEDURES:  Examination under anesthesia and lateral internal anal  sphincterotomy.   SURGEON:  Lorne Skeens. Hoxworth, M.D.   ANESTHESIA:  Laryngeal mask general.   BRIEF HISTORY:  [name] is a 45 year old female with a history of anal  fissure status post anal sphincterotomy approximately 2003. She had complete  relief of her symptoms following this. She, however, has had recently  intermittent, recurrent, severe anal pain. Examination in the office has  suggestive possibly a fissure, although it is difficult to determine on exam  in the office. Due to persistent and worsening symptoms that have been  unresponsive to medical management, we have recommended proceeding with  examination under anesthesia and probable a lateral internal anal  sphincterotomy. The nature of the procedure, indications, risks of bleeding,  infection persistent pain and degrees of incontinence have been discussed  and understood. The patient is now brought to the operating room for this  procedure.   DESCRIPTION OF PROCEDURE:  The patient brought to the operating room and  placed in the supine position on the operating room table, and a laryngeal  mask general anesthesia was induced. She was carefully positioned in  lithotomy position and the perineum prepped and draped. A digital exam was  unremarkable. An anal retractor was placed and examination revealed some  mild noninflamed hemorrhoid disease. There did appear to be evidence, just  off the posterior midline, to the  right, of a superficial chronic fissure.  The internal anal sphincter did feel somewhat hypertrophied. I elected to  proceed with sphincterotomy.   The intersphincteric groove was palpated in the left lateral position; an a  small stab incision made; and a tip of a hemostat inserted into the  intersphincteric groove; and the hypertrophied internal anal sphincter  elevated into the wound and divided with cautery. Hemostasis was obtained  with cautery and pressure. A rectal block with Marcaine with epinephrine was  performed. Hemostasis assured. A dry gauze dressing was applied; and the  patient taken recovery in good condition.      Lorne Skeens. Hoxworth, M.D.  Electronically Signed     BTH/MEDQ  D:  09/16/2005  T:  09/16/2005  Job:  [id number]

## 2011-02-25 NOTE — Op Note (Signed)
[facility]  Patient:    [name], [name] Visit Number: [id number] MRN: [id number]          Service Type: EMS Location: [facility] Attending Physician:  Hanley Seamen Dictated by:   Lorne Skeens. Hoxworth, M.D. Proc. Date: 12/31/01 Admit Date:  10/06/2001 Discharge Date: 10/06/2001                             Operative Report  PREOPERATIVE DIAGNOSIS:  Anal fissure.  POSTOPERATIVE DIAGNOSIS:  Anal fissure.  SURGICAL PROCEDURE:  Lateral internal anal sphincterotomy.  SURGEON:  Lorne Skeens. Hoxworth, M.D.  ANESTHESIA:  General.  BRIEF HISTORY:  [name] is a 45 year old black female, who presents with anal pain and bleeding with bowel movements.  Exam has revealed a posterior midline fissure.  This has failed to respond to conservative measures, and lateral internal anal sphincterotomy has been recommended and accepted.  The nature of the procedure, its indications, risks of bleeding, infection, degrees of incontinence, and nonhealing were discussed and understood.  She is now brought to the operating room for this procedure.  DESCRIPTION OF OPERATION:  The patient brought to the operating room and placed in the supine position on the operating table, and general anesthesia was induced.  She was carefully positioned in the lithotomy position, and the perineum was sterilely prepped and draped.  She received preoperative antibiotics.  The anus was gently dilated and a retractor placed which confirmed a posterior midline fissure and no other significant pathology.  The internal anal sphincter was obviously hypertrophied and easily palpable.  The intersphincteric groove was palpated in the right lateral position and Marcaine with epinephrine injected and a small stab incision made.  The tip of the hemostat was then inserted into the intersphincteric groove and the internal anal sphincter isolated and elevated up into the small incision no acute distress  divided with cautery with good relaxation back to a normal state.  Hemostasis was obtained with cautery.  The perianal area was further infiltrated with Marcaine.  A dry gauze dressing was applied and the patient taken to recovery in good condition. Dictated by:   Lorne Skeens. Hoxworth, M.D. Attending Physician:  Hanley Seamen DD:  12/31/01 TD:  12/31/01 Job: [id number]

## 2011-03-21 ENCOUNTER — Emergency Department: Payer: Self-pay | Admitting: Emergency Medicine

## 2011-04-16 ENCOUNTER — Emergency Department: Payer: Self-pay | Admitting: Emergency Medicine

## 2011-06-27 ENCOUNTER — Ambulatory Visit: Payer: Self-pay

## 2011-07-20 ENCOUNTER — Emergency Department: Payer: Self-pay | Admitting: Emergency Medicine

## 2011-08-03 ENCOUNTER — Ambulatory Visit: Payer: Self-pay

## 2011-10-12 ENCOUNTER — Ambulatory Visit: Payer: Self-pay

## 2011-11-20 ENCOUNTER — Emergency Department: Payer: Self-pay | Admitting: Emergency Medicine

## 2012-02-20 ENCOUNTER — Ambulatory Visit: Payer: Self-pay | Admitting: Internal Medicine

## 2012-03-16 LAB — BASIC METABOLIC PANEL
Anion Gap: 6 — ABNORMAL LOW (ref 7–16)
BUN: 8 mg/dL (ref 7–18)
Calcium, Total: 8.8 mg/dL (ref 8.5–10.1)
Chloride: 112 mmol/L — ABNORMAL HIGH (ref 98–107)
Co2: 29 mmol/L (ref 21–32)
Creatinine: 0.72 mg/dL (ref 0.60–1.30)
EGFR (African American): >60
EGFR (Non-African Amer.): >60
Glucose: 74 mg/dL (ref 65–99)
Osmolality: 289 (ref 275–301)
Potassium: 3.3 mmol/L — ABNORMAL LOW (ref 3.5–5.1)
Sodium: 147 mmol/L — ABNORMAL HIGH (ref 136–145)

## 2012-03-16 LAB — CBC
HCT: 37.3 % (ref 35.0–47.0)
HGB: 12.2 g/dL (ref 12.0–16.0)
MCH: 27.5 pg (ref 26.0–34.0)
MCHC: 32.7 g/dL (ref 32.0–36.0)
MCV: 84 fL (ref 80–100)
Platelet: 263 10*3/uL (ref 150–440)
RBC: 4.43 10*6/uL (ref 3.80–5.20)
RDW: 13.6 % (ref 11.5–14.5)
WBC: 6.7 10*3/uL (ref 3.6–11.0)

## 2012-03-16 LAB — CK TOTAL AND CKMB (NOT AT ARMC)
CK, Total: 115 U/L (ref 21–215)
CK-MB: 0.5 ng/mL (ref 0.5–3.6)

## 2012-03-16 LAB — TROPONIN I: Troponin-I: <0.02 ng/mL

## 2012-03-17 ENCOUNTER — Observation Stay: Payer: Self-pay | Admitting: Internal Medicine

## 2012-03-17 LAB — CBC WITH DIFFERENTIAL/PLATELET
Basophil #: 0 10*3/uL (ref 0.0–0.1)
Basophil %: 0.6 %
Eosinophil #: 0.1 10*3/uL (ref 0.0–0.7)
Eosinophil %: 1.2 %
HCT: 35.9 % (ref 35.0–47.0)
HGB: 11.7 g/dL — ABNORMAL LOW (ref 12.0–16.0)
Lymphocyte #: 1.4 10*3/uL (ref 1.0–3.6)
Lymphocyte %: 29 %
MCH: 27.4 pg (ref 26.0–34.0)
MCHC: 32.5 g/dL (ref 32.0–36.0)
MCV: 84 fL (ref 80–100)
Monocyte #: 0.3 x10 3/mm (ref 0.2–0.9)
Monocyte %: 6.8 %
Neutrophil #: 3.1 10*3/uL (ref 1.4–6.5)
Neutrophil %: 62.4 %
Platelet: 245 10*3/uL (ref 150–440)
RBC: 4.26 10*6/uL (ref 3.80–5.20)
RDW: 13.6 % (ref 11.5–14.5)
WBC: 4.9 10*3/uL (ref 3.6–11.0)

## 2012-03-17 LAB — CK: CK, Total: 90 U/L (ref 21–215)

## 2012-03-17 LAB — BASIC METABOLIC PANEL
Anion Gap: 8 (ref 7–16)
BUN: 11 mg/dL (ref 7–18)
Calcium, Total: 8.8 mg/dL (ref 8.5–10.1)
Chloride: 109 mmol/L — ABNORMAL HIGH (ref 98–107)
Co2: 28 mmol/L (ref 21–32)
Creatinine: 0.55 mg/dL — ABNORMAL LOW (ref 0.60–1.30)
EGFR (African American): >60
EGFR (Non-African Amer.): >60
Glucose: 87 mg/dL (ref 65–99)
Osmolality: 287 (ref 275–301)
Potassium: 2.9 mmol/L — ABNORMAL LOW (ref 3.5–5.1)
Sodium: 145 mmol/L (ref 136–145)

## 2012-03-17 LAB — CK TOTAL AND CKMB (NOT AT ARMC)
CK, Total: 77 U/L (ref 21–215)
CK-MB: <0.5 ng/mL — ABNORMAL LOW (ref 0.5–3.6)

## 2012-03-17 LAB — CK-MB: CK-MB: <0.5 ng/mL — ABNORMAL LOW (ref 0.5–3.6)

## 2012-03-17 LAB — TROPONIN I
Troponin-I: <0.02 ng/mL
Troponin-I: <0.02 ng/mL

## 2012-03-18 LAB — LIPID PANEL
Cholesterol: 167 mg/dL (ref 0–200)
HDL Cholesterol: 60 mg/dL (ref 40–60)
Ldl Cholesterol, Calc: 95 mg/dL (ref 0–100)
Triglycerides: 58 mg/dL (ref 0–200)
VLDL Cholesterol, Calc: 12 mg/dL (ref 5–40)

## 2012-03-18 LAB — MAGNESIUM: Magnesium: 1.8 mg/dL

## 2012-04-04 ENCOUNTER — Emergency Department (HOSPITAL_COMMUNITY): Payer: Medicaid Other

## 2012-04-04 ENCOUNTER — Encounter (HOSPITAL_COMMUNITY): Payer: Self-pay | Admitting: *Deleted

## 2012-04-04 ENCOUNTER — Emergency Department (HOSPITAL_COMMUNITY)
Admission: EM | Admit: 2012-04-04 | Discharge: 2012-04-05 | Disposition: A | Discharge status: Home / Self Care | Payer: Medicaid Other | Attending: Emergency Medicine | Admitting: Emergency Medicine

## 2012-04-04 DIAGNOSIS — I10 Essential (primary) hypertension: Secondary | ICD-10-CM | POA: Insufficient documentation

## 2012-04-04 DIAGNOSIS — J45909 Unspecified asthma, uncomplicated: Secondary | ICD-10-CM | POA: Insufficient documentation

## 2012-04-04 DIAGNOSIS — R091 Pleurisy: Secondary | ICD-10-CM

## 2012-04-04 DIAGNOSIS — K219 Gastro-esophageal reflux disease without esophagitis: Secondary | ICD-10-CM | POA: Insufficient documentation

## 2012-04-04 HISTORY — DX: Unspecified asthma, uncomplicated: J45.909

## 2012-04-04 HISTORY — DX: Gastro-esophageal reflux disease without esophagitis: K21.9

## 2012-04-04 HISTORY — DX: Essential (primary) hypertension: I10

## 2012-04-04 LAB — BASIC METABOLIC PANEL
BUN: 12 mg/dL (ref 6–23)
CO2: 26 mEq/L (ref 19–32)
Calcium: 9.4 mg/dL (ref 8.4–10.5)
Chloride: 108 mEq/L (ref 96–112)
Creatinine, Ser: 0.5 mg/dL (ref 0.50–1.10)
GFR calc Af Amer: >90 mL/min (ref >90)
GFR calc non Af Amer: >90 mL/min (ref >90)
Glucose, Bld: 97 mg/dL (ref 70–99)
Potassium: 3.1 mEq/L — ABNORMAL LOW (ref 3.5–5.1)
Sodium: 143 mEq/L (ref 135–145)

## 2012-04-04 LAB — CBC
HCT: 33.9 % — ABNORMAL LOW (ref 36.0–46.0)
Hemoglobin: 11.2 g/dL — ABNORMAL LOW (ref 12.0–15.0)
MCH: 27.3 pg (ref 26.0–34.0)
MCHC: 33 g/dL (ref 30.0–36.0)
MCV: 82.7 fL (ref 78.0–100.0)
Platelets: 242 10*3/uL (ref 150–400)
RBC: 4.1 MIL/uL (ref 3.87–5.11)
RDW: 13.5 % (ref 11.5–15.5)
WBC: 6.6 10*3/uL (ref 4.0–10.5)

## 2012-04-04 LAB — POCT I-STAT TROPONIN I: Troponin i, poc: 0 ng/mL (ref 0.00–0.08)

## 2012-04-04 MED ORDER — OXYCODONE-ACETAMINOPHEN 5-325 MG PO TABS: 1.0000 | ORAL_TABLET | Freq: Four times a day (QID) | ORAL | Status: AC | PRN

## 2012-04-04 MED ORDER — OXYCODONE-ACETAMINOPHEN 5-325 MG PO TABS
1.0000 | ORAL_TABLET | Freq: Once | ORAL | Status: AC
  Administered 2012-04-04: 1 via ORAL
  Filled 2012-04-04: qty 1

## 2012-04-04 MED ORDER — IPRATROPIUM BROMIDE 0.02 % IN SOLN
0.5000 mg | Freq: Once | RESPIRATORY_TRACT | Status: AC
  Administered 2012-04-04: 0.5 mg via RESPIRATORY_TRACT
  Filled 2012-04-04: qty 2.5

## 2012-04-04 MED ORDER — ALBUTEROL SULFATE HFA 108 (90 BASE) MCG/ACT IN AERS: 2.0000 | INHALATION_SPRAY | RESPIRATORY_TRACT | Status: DC | PRN

## 2012-04-04 MED ORDER — HYDROMORPHONE HCL PF 2 MG/ML IJ SOLN
2.0000 mg | Freq: Once | INTRAMUSCULAR | Status: AC
  Administered 2012-04-04: 2 mg via INTRAMUSCULAR
  Filled 2012-04-04: qty 1

## 2012-04-04 MED ORDER — ALBUTEROL SULFATE (5 MG/ML) 0.5% IN NEBU
5.0000 mg | INHALATION_SOLUTION | Freq: Once | RESPIRATORY_TRACT | Status: AC
  Administered 2012-04-04: 5 mg via RESPIRATORY_TRACT
  Filled 2012-04-04: qty 1

## 2012-04-04 NOTE — ED Provider Notes (Addendum)
History     CSN: [id number]  Arrival date & time 04/04/12  1955   First MD Initiated Contact with Patient 04/04/12 2011      Chief Complaint  Patient presents with  . Chest Pain    (Consider location/radiation/quality/duration/timing/severity/associated sxs/prior treatment) Patient is a 46 y.o. female presenting with chest pain. The history is provided by the patient.  Chest Pain The chest pain began 12 - 24 hours ago. Chest pain occurs constantly. The chest pain is unchanged. The pain is associated with breathing and coughing. At its most intense, the pain is at 8/10. The pain is currently at 8/10. The severity of the pain is severe. The quality of the pain is described as pleuritic, sharp and stabbing. The pain does not radiate. Chest pain is worsened by certain positions and deep breathing. Primary symptoms include shortness of breath, cough and wheezing. Pertinent negatives for primary symptoms include no fever, no palpitations, no abdominal pain, no nausea and no vomiting.  Pertinent negatives for associated symptoms include no diaphoresis, no near-syncope and no weakness. She tried beta-agonist inhalers for the symptoms. Risk factors include no known risk factors.  Her past medical history is significant for hypertension.  Pertinent negatives for past medical history include no diabetes and no hyperlipidemia.     Past Medical History  Diagnosis Date  . Hypertension   . Asthma   . Gastric reflux     No past surgical history on file.  No family history on file.  History  Substance Use Topics  . Smoking status: Not on file  . Smokeless tobacco: Not on file  . Alcohol Use:     OB History    Grav Para Term Preterm Abortions TAB SAB Ect Mult Living                  Review of Systems  Constitutional: Negative for fever and diaphoresis.  Respiratory: Positive for cough, shortness of breath and wheezing.   Cardiovascular: Positive for chest pain. Negative for  palpitations and near-syncope.  Gastrointestinal: Negative for nausea, vomiting and abdominal pain.  Neurological: Negative for weakness.  All other systems reviewed and are negative.    Allergies  Review of patient's allergies indicates no known allergies.  Home Medications  No current outpatient prescriptions on file.  BP 112/60  Pulse 75  Temp 98.5 F (36.9 C) (Oral)  Resp 18  SpO2 98%  Physical Exam  Nursing note and vitals reviewed. Constitutional: She is oriented to person, place, and time. She appears well-developed and well-nourished. No distress.  HENT:  Head: Normocephalic and atraumatic.  Eyes: EOM are normal. Pupils are equal, round, and reactive to light.  Neck: Normal range of motion. Neck supple.  Cardiovascular: Normal rate, regular rhythm, normal heart sounds and intact distal pulses.  Exam reveals no friction rub.   No murmur heard. Pulmonary/Chest: Effort normal and breath sounds normal. She has no wheezes. She has no rales. She exhibits tenderness.       Diffuse chest wall tenderness  Abdominal: Soft. Bowel sounds are normal. She exhibits no distension. There is no tenderness. There is no rebound and no guarding.  Musculoskeletal: Normal range of motion. She exhibits no tenderness.       No edema  Neurological: She is alert and oriented to person, place, and time. No cranial nerve deficit.  Skin: Skin is warm and dry. No rash noted.  Psychiatric: She has a normal mood and affect. Her behavior is normal.  ED Course  Procedures (including critical care time)  Labs Reviewed  CBC - Abnormal; Notable for the following:    Hemoglobin 11.2 (*)     HCT 33.9 (*)     All other components within normal limits  BASIC METABOLIC PANEL - Abnormal; Notable for the following:    Potassium 3.1 (*)     All other components within normal limits  POCT I-STAT TROPONIN I   Dg Chest 2 View  04/04/2012  *RADIOLOGY REPORT*  Clinical Data: 46 year old female with chest  pain, hypertension.  CHEST - 2 VIEW  Comparison: 09/15/2005.  Findings: Lower lung volumes.  Crowding of markings at the lung bases.  Cardiac size and mediastinal contours are within normal limits.  Visualized tracheal air column is within normal limits. No pneumothorax, pulmonary edema, pleural effusion or consolidation. No acute osseous abnormality identified.  Right upper quadrant surgical clips again noted.  IMPRESSION: Low lung volumes, otherwise no acute cardiopulmonary abnormality.  Original Report Authenticated By: Harley Hallmark, M.D.     Date: 04/04/2012  Rate: 68  Rhythm: normal sinus rhythm  QRS Axis: normal  Intervals: normal  ST/T Wave abnormalities: normal  Conduction Disutrbances: none  Narrative Interpretation: unremarkable     1. Pleurisy       MDM   Pt with atypical story for CP with seems to be pleuritic in nature.  Worse with breathing and moving adn hx of asthma.  TIMI 0 and only risk factor is HTN.  PERC neg.  EKG wnl.  CXR, CBC, BMP, CE all wnl.  And > 12 hours of pain and with one neg enzyme feel cardiac is r/o.  Pt is wheezing on exam here and will give pain control and albuterol/atrovent.  Pt still having pain on exam and ordered IM pain control.   11:38 PM Pain controlled and pt d/ced home.    Gwyneth Sprout, MD 04/04/12 [id number]  Gwyneth Sprout, MD 04/04/12 [id number]  Gwyneth Sprout, MD 04/04/12 [phone]

## 2012-04-04 NOTE — ED Notes (Signed)
Per EMS - pt awoke w/ chest pain this a.m. - progressively worse, substernal chest pain, non-radiating. Pt denies n/v or shortness of breath - pt w/ increased pain w/ movement and inspiration. Pt given x4 81mg  ASA and x1 0.4mg  nitro. 18g NSL LAC.

## 2012-04-04 NOTE — ED Notes (Addendum)
Pt reports central chest pressure that began this a.m., pt tender to area w/ light palpation - pt states she experienced x2 episodes of dizziness - denies shortness of breath however states she used her home nebulizer x2 today for wheezing. LS clear and equal bilat at present. Pt denies any cardiac hx - A&Ox4, in no acute distress, skin warm and dry. Pt w/o any swelling to extremities. Pt denies n/v.

## 2012-04-05 NOTE — ED Notes (Signed)
Rx x 1, pt voiced understanding to f/u with PCP and return for worsening in condition.

## 2012-05-28 ENCOUNTER — Encounter: Payer: Self-pay | Admitting: Cardiovascular Disease

## 2012-06-04 ENCOUNTER — Ambulatory Visit (INDEPENDENT_AMBULATORY_CARE_PROVIDER_SITE_OTHER): Payer: Medicaid Other | Admitting: Cardiovascular Disease

## 2012-06-04 ENCOUNTER — Encounter: Payer: Self-pay | Admitting: Cardiovascular Disease

## 2012-06-04 VITALS — BP 110/82 | HR 75 | Ht 64.0 in | Wt 218.8 lb

## 2012-06-04 DIAGNOSIS — R011 Cardiac murmur, unspecified: Secondary | ICD-10-CM

## 2012-06-04 DIAGNOSIS — R079 Chest pain, unspecified: Secondary | ICD-10-CM

## 2012-06-04 DIAGNOSIS — R0602 Shortness of breath: Secondary | ICD-10-CM

## 2012-06-05 LAB — CBC WITH DIFFERENTIAL/PLATELET
Basophils Absolute: 0 10*3/uL (ref 0.0–0.2)
Basos: 0 % (ref 0–3)
Eos: 1 % (ref 0–7)
Eosinophils Absolute: 0 10*3/uL (ref 0.0–0.4)
HCT: 37.7 % (ref 34.0–46.6)
Hemoglobin: 12.2 g/dL (ref 11.1–15.9)
Immature Grans (Abs): 0 10*3/uL (ref 0.0–0.1)
Immature Granulocytes: 0 % (ref 0–2)
Lymphocytes Absolute: 1.1 10*3/uL (ref 0.7–4.5)
Lymphs: 25 % (ref 14–46)
MCH: 27.2 pg (ref 26.6–33.0)
MCHC: 32.4 g/dL (ref 31.5–35.7)
MCV: 84 fL (ref 79–97)
Monocytes Absolute: 0.5 10*3/uL (ref 0.1–1.0)
Monocytes: 12 % (ref 4–13)
Neutrophils Absolute: 2.8 10*3/uL (ref 1.8–7.8)
Neutrophils Relative %: 62 % (ref 40–74)
RBC: 4.49 x10E6/uL (ref 3.77–5.28)
RDW: 13.6 % (ref 12.3–15.4)
WBC: 4.5 10*3/uL (ref 4.0–10.5)

## 2012-06-05 LAB — BASIC METABOLIC PANEL
BUN/Creatinine Ratio: 18 (ref 9–23)
BUN: 10 mg/dL (ref 6–24)
CO2: 24 mmol/L (ref 19–28)
Calcium: 9 mg/dL (ref 8.7–10.2)
Chloride: 106 mmol/L (ref 97–108)
Creatinine, Ser: 0.57 mg/dL (ref 0.57–1.00)
GFR calc Af Amer: 128 mL/min/{1.73_m2} (ref >59)
GFR calc non Af Amer: 111 mL/min/{1.73_m2} (ref >59)
Glucose: 80 mg/dL (ref 65–99)
Potassium: 3.5 mmol/L (ref 3.5–5.2)
Sodium: 142 mmol/L (ref 134–144)

## 2012-06-05 LAB — PROTIME-INR
INR: 1.1 (ref 0.8–1.2)
Prothrombin Time: 11.2 s (ref 9.1–12.0)

## 2012-06-06 ENCOUNTER — Encounter: Payer: Self-pay | Admitting: Cardiovascular Disease

## 2012-06-06 DIAGNOSIS — R011 Cardiac murmur, unspecified: Secondary | ICD-10-CM | POA: Insufficient documentation

## 2012-06-06 DIAGNOSIS — R079 Chest pain, unspecified: Secondary | ICD-10-CM | POA: Insufficient documentation

## 2012-06-06 NOTE — Progress Notes (Signed)
HPI  This is a 46 year old female who was referred by Dr. Beverely Risen for evaluation of chest pain and abnormal stress test. The patient has known history of asthma and hypertension. She has been having substernal chest discomfort described as tightness this can happen at rest and with activities. She also complains of an recent exertional dyspnea. She denies orthopnea, PND or lower extremity edema. The patient had workup done that included an echocardiogram which showed normal LV systolic function, mild left ventricular hypertrophy and mildly increased velocity across the LVOT with no evidence of aortic stenosis. She underwent a treadmill stress test which was reported as abnormal with exercise-induced chest pain which was the reason for terminating the stress test. It appears that she was able to exercise for 12 minutes with a maximum heart rate of 173 beats per minute.  No Known Allergies   Current Outpatient Prescriptions on File Prior to Visit  Medication Sig Dispense Refill  . albuterol (PROVENTIL HFA;VENTOLIN HFA) 108 (90 BASE) MCG/ACT inhaler Inhale 2 puffs into the lungs 2 (two) times daily.      Marland Kitchen atenolol (TENORMIN) 25 MG tablet Take 25 mg by mouth daily.      . budesonide-formoterol (SYMBICORT) 160-4.5 MCG/ACT inhaler Inhale 2 puffs into the lungs 2 (two) times daily.      . cetirizine (ZYRTEC) 10 MG tablet Take 10 mg by mouth daily.      . cyclobenzaprine (FLEXERIL) 10 MG tablet Take 10 mg by mouth 2 (two) times daily as needed.      Marland Kitchen ibuprofen (ADVIL,MOTRIN) 600 MG tablet Take 600 mg by mouth every 8 (eight) hours as needed.      Marland Kitchen losartan (COZAAR) 100 MG tablet Take 100 mg by mouth daily.      Marland Kitchen omeprazole (PRILOSEC) 40 MG capsule Take 40 mg by mouth daily.         Past Medical History  Diagnosis Date  . Hypertension   . Asthma   . Gastric reflux   . Syncope and collapse      Past Surgical History  Procedure Date  . Cholecystectomy   . Vaginal hysterectomy   .  Cardiac catheterization     more than 5 years ago     History reviewed. No pertinent family history.   History   Social History  . Marital Status: Married    Spouse Name: N/A    Number of Children: N/A  . Years of Education: N/A   Occupational History  . Not on file.   Social History Main Topics  . Smoking status: Never Smoker   . Smokeless tobacco: Not on file  . Alcohol Use: No  . Drug Use: No  . Sexually Active:    Other Topics Concern  . Not on file   Social History Narrative  . No narrative on file     ROS Constitutional: Negative for fever, chills, diaphoresis, activity change, appetite change and fatigue.  HENT: Negative for hearing loss, nosebleeds, congestion, sore throat, facial swelling, drooling, trouble swallowing, neck pain, voice change, sinus pressure and tinnitus.  Eyes: Negative for photophobia, pain, discharge and visual disturbance.  Respiratory: Negative for apnea, cough and wheezing.  Cardiovascular: Negative for  palpitations and leg swelling.  Gastrointestinal: Negative for nausea, vomiting, abdominal pain, diarrhea, constipation, blood in stool and abdominal distention.  Genitourinary: Negative for dysuria, urgency, frequency, hematuria and decreased urine volume.  Musculoskeletal: Negative for myalgias, back pain, joint swelling, arthralgias and gait problem.  Skin:  Negative for color change, pallor, rash and wound.  Neurological: Negative for dizziness, tremors, seizures, syncope, speech difficulty, weakness, light-headedness, numbness and headaches.  Psychiatric/Behavioral: Negative for suicidal ideas, hallucinations, behavioral problems and agitation. The patient is not nervous/anxious.     PHYSICAL EXAM   BP 110/82  Pulse 75  Ht 5\' 4"  (1.626 m)  Wt 218 lb 12 oz (99.224 kg)  BMI 37.55 kg/m2 Constitutional: She is oriented to person, place, and time. She appears well-developed and well-nourished. No distress.  HENT: No nasal  discharge.  Head: Normocephalic and atraumatic.  Eyes: Pupils are equal and round. Right eye exhibits no discharge. Left eye exhibits no discharge.  Neck: Normal range of motion. Neck supple. No JVD present. No thyromegaly present.  Cardiovascular: Normal rate, regular rhythm, normal heart sounds. Exam reveals no gallop and no friction rub. There is a 2/6 systolic ejection murmur at the aortic area. Pulmonary/Chest: Effort normal and breath sounds normal. No stridor. No respiratory distress. She has no wheezes. She has no rales. She exhibits no tenderness.  Abdominal: Soft. Bowel sounds are normal. She exhibits no distension. There is no tenderness. There is no rebound and no guarding.  Musculoskeletal: Normal range of motion. She exhibits trace edema and no tenderness.  Neurological: She is alert and oriented to person, place, and time. Coordination normal.  Skin: Skin is warm and dry. No rash noted. She is not diaphoretic. No erythema. No pallor.  Psychiatric: She has a normal mood and affect. Her behavior is normal. Judgment and thought content normal.     EKG: Sinus  Rhythm  Low voltage in precordial leads.   ABNORMAL    ASSESSMENT AND PLAN

## 2012-06-06 NOTE — Patient Instructions (Signed)

## 2012-06-06 NOTE — Assessment & Plan Note (Signed)
Patient has a systolic murmur in the aortic area. This did not become louder with Valsalva maneuver. I will check LVOT/aortic valve gradient during cardiac catheterization.

## 2012-06-06 NOTE — Assessment & Plan Note (Signed)
The patient's chest pain is overall atypical but she does complain of an exertional component. She had a slightly abnormal stress test. Thus, a definitive diagnoses as needed. I recommend proceeding with cardiac catheterization and possible coronary intervention. Risks, benefits and alternatives were discussed with the patient.

## 2012-06-07 ENCOUNTER — Ambulatory Visit: Payer: Self-pay | Admitting: Cardiovascular Disease

## 2012-06-07 DIAGNOSIS — R079 Chest pain, unspecified: Secondary | ICD-10-CM

## 2012-06-07 HISTORY — PX: CARDIAC CATHETERIZATION: SHX172

## 2012-06-13 ENCOUNTER — Encounter: Payer: Self-pay | Admitting: Cardiovascular Disease

## 2012-06-13 ENCOUNTER — Ambulatory Visit: Payer: Self-pay | Admitting: Internal Medicine

## 2012-06-21 ENCOUNTER — Ambulatory Visit (INDEPENDENT_AMBULATORY_CARE_PROVIDER_SITE_OTHER): Payer: Medicaid Other | Admitting: Cardiovascular Disease

## 2012-06-21 ENCOUNTER — Encounter: Payer: Self-pay | Admitting: Cardiovascular Disease

## 2012-06-21 VITALS — BP 122/72 | HR 77 | Ht 64.0 in | Wt 221.5 lb

## 2012-06-21 DIAGNOSIS — R011 Cardiac murmur, unspecified: Secondary | ICD-10-CM

## 2012-06-21 DIAGNOSIS — R079 Chest pain, unspecified: Secondary | ICD-10-CM

## 2012-06-21 NOTE — Patient Instructions (Addendum)
Follow up as needed

## 2012-06-21 NOTE — Progress Notes (Signed)
HPI  This is a 46 year old [place] American female who is here today for a followup visit. She was seen recently for chest pain and slightly abnormal treadmill stress test. She underwent cardiac catheterization via the right radial artery which showed no significant coronary artery disease. There was no significant LVOT/aortic valve gradient. She continues to have intermittent episode of chest discomfort. She is currently undergoing GI evaluation. She noticed a knot at the cath site with some discomfort. However, there is no limitation with right hand movement.  No Known Allergies   Current Outpatient Prescriptions on File Prior to Visit  Medication Sig Dispense Refill  . albuterol (PROVENTIL HFA;VENTOLIN HFA) 108 (90 BASE) MCG/ACT inhaler Inhale 2 puffs into the lungs 2 (two) times daily.      Marland Kitchen atenolol (TENORMIN) 25 MG tablet Take 25 mg by mouth daily.      . budesonide-formoterol (SYMBICORT) 160-4.5 MCG/ACT inhaler Inhale 2 puffs into the lungs 2 (two) times daily.      . cetirizine (ZYRTEC) 10 MG tablet Take 10 mg by mouth daily.      . cyclobenzaprine (FLEXERIL) 10 MG tablet Take 10 mg by mouth 2 (two) times daily as needed.      Marland Kitchen ibuprofen (ADVIL,MOTRIN) 600 MG tablet Take 600 mg by mouth every 8 (eight) hours as needed.      Marland Kitchen losartan (COZAAR) 100 MG tablet Take 100 mg by mouth daily.      . NON FORMULARY Oxygen 2 Liters at bedtime.      Marland Kitchen omeprazole (PRILOSEC) 40 MG capsule Take 40 mg by mouth daily.         Past Medical History  Diagnosis Date  . Hypertension   . Asthma   . Gastric reflux   . Syncope and collapse   . Osteoarthritis      Past Surgical History  Procedure Date  . Cholecystectomy   . Vaginal hysterectomy   . Cardiac catheterization     more than 5 years ago  . Cardiac catheterization 06-07-2012    [facility]: Normal coronary arteries. No significant LVOT/aortic valve gradient     History reviewed. No pertinent family history.   History   Social  History  . Marital Status: Married    Spouse Name: N/A    Number of Children: N/A  . Years of Education: N/A   Occupational History  . Not on file.   Social History Main Topics  . Smoking status: Never Smoker   . Smokeless tobacco: Not on file  . Alcohol Use: No  . Drug Use: No  . Sexually Active:    Other Topics Concern  . Not on file   Social History Narrative  . No narrative on file     PHYSICAL EXAM   BP 122/72  Pulse 77  Ht 5\' 4"  (1.626 m)  Wt 221 lb 8 oz (100.472 kg)  BMI 38.02 kg/m2  Constitutional: She is oriented to person, place, and time. She appears well-developed and well-nourished. No distress.  HENT: No nasal discharge.  Head: Normocephalic and atraumatic.  Eyes: Pupils are equal and round. Right eye exhibits no discharge. Left eye exhibits no discharge.  Neck: Normal range of motion. Neck supple. No JVD present. No thyromegaly present.  Cardiovascular: Normal rate, regular rhythm, normal heart sounds. Exam reveals no gallop and no friction rub. There is one out of 6 systolic ejection murmur at the base of the heart. Pulmonary/Chest: Effort normal and breath sounds normal. No stridor. No  respiratory distress. She has no wheezes. She has no rales. She exhibits no tenderness.  Abdominal: Soft. Bowel sounds are normal. She exhibits no distension. There is no tenderness. There is no rebound and no guarding.  Musculoskeletal: Normal range of motion. She exhibits no edema and no tenderness.  Neurological: She is alert and oriented to person, place, and time. Coordination normal.  Skin: Skin is warm and dry. No rash noted. She is not diaphoretic. No erythema. No pallor.  Psychiatric: She has a normal mood and affect. Her behavior is normal. Judgment and thought content normal.  Right radial pulse is normal. However, there is a small scar tissue which is slightly tender.   EKG: Sinus  Rhythm  Low voltage in precordial leads.    ASSESSMENT AND PLAN

## 2012-06-21 NOTE — Assessment & Plan Note (Signed)
Her chest pain is noncardiac. Cardiac catheterization showed no significant coronary artery disease. She is currently undergoing GI evaluation. Followup with [place] as needed.

## 2012-06-21 NOTE — Assessment & Plan Note (Signed)
This appears to be a flow murmur. There was no significant gradient across the LVOT/aortic valve during cardiac catheterization.

## 2012-08-18 ENCOUNTER — Emergency Department: Payer: Self-pay | Admitting: Unknown Physician Specialty

## 2012-08-18 LAB — COMPREHENSIVE METABOLIC PANEL
Albumin: 3.5 g/dL (ref 3.4–5.0)
Alkaline Phosphatase: 116 U/L (ref 50–136)
Anion Gap: 7 (ref 7–16)
BUN: 12 mg/dL (ref 7–18)
Bilirubin,Total: 0.3 mg/dL (ref 0.2–1.0)
Calcium, Total: 9.2 mg/dL (ref 8.5–10.1)
Chloride: 113 mmol/L — ABNORMAL HIGH (ref 98–107)
Co2: 27 mmol/L (ref 21–32)
Creatinine: 0.54 mg/dL — ABNORMAL LOW (ref 0.60–1.30)
EGFR (African American): >60
EGFR (Non-African Amer.): >60
Glucose: 78 mg/dL (ref 65–99)
Osmolality: 291 (ref 275–301)
Potassium: 3.3 mmol/L — ABNORMAL LOW (ref 3.5–5.1)
SGOT(AST): 13 U/L — ABNORMAL LOW (ref 15–37)
SGPT (ALT): 20 U/L (ref 12–78)
Sodium: 147 mmol/L — ABNORMAL HIGH (ref 136–145)
Total Protein: 7.2 g/dL (ref 6.4–8.2)

## 2012-08-18 LAB — CBC
HCT: 38.6 % (ref 35.0–47.0)
HGB: 13 g/dL (ref 12.0–16.0)
MCH: 28.2 pg (ref 26.0–34.0)
MCHC: 33.8 g/dL (ref 32.0–36.0)
MCV: 84 fL (ref 80–100)
Platelet: 311 10*3/uL (ref 150–440)
RBC: 4.62 10*6/uL (ref 3.80–5.20)
RDW: 13.9 % (ref 11.5–14.5)
WBC: 7.5 10*3/uL (ref 3.6–11.0)

## 2012-08-18 LAB — TROPONIN I: Troponin-I: <0.02 ng/mL

## 2012-08-27 ENCOUNTER — Ambulatory Visit: Payer: Medicaid Other | Admitting: Cardiovascular Disease

## 2012-09-26 ENCOUNTER — Emergency Department: Payer: Self-pay | Admitting: Internal Medicine

## 2012-09-26 LAB — CBC
HCT: 38.7 % (ref 35.0–47.0)
HGB: 12.9 g/dL (ref 12.0–16.0)
MCH: 27.7 pg (ref 26.0–34.0)
MCHC: 33.3 g/dL (ref 32.0–36.0)
MCV: 83 fL (ref 80–100)
Platelet: 288 10*3/uL (ref 150–440)
RBC: 4.66 10*6/uL (ref 3.80–5.20)
RDW: 13.5 % (ref 11.5–14.5)
WBC: 9.2 10*3/uL (ref 3.6–11.0)

## 2012-09-26 LAB — COMPREHENSIVE METABOLIC PANEL
Albumin: 3.3 g/dL — ABNORMAL LOW (ref 3.4–5.0)
Alkaline Phosphatase: 117 U/L (ref 50–136)
Anion Gap: 7 (ref 7–16)
BUN: 4 mg/dL — ABNORMAL LOW (ref 7–18)
Bilirubin,Total: 0.6 mg/dL (ref 0.2–1.0)
Calcium, Total: 8.6 mg/dL (ref 8.5–10.1)
Chloride: 106 mmol/L (ref 98–107)
Co2: 31 mmol/L (ref 21–32)
Creatinine: 0.54 mg/dL — ABNORMAL LOW (ref 0.60–1.30)
EGFR (African American): >60
EGFR (Non-African Amer.): >60
Glucose: 86 mg/dL (ref 65–99)
Osmolality: 283 (ref 275–301)
Potassium: 2.7 mmol/L — ABNORMAL LOW (ref 3.5–5.1)
SGOT(AST): 10 U/L — ABNORMAL LOW (ref 15–37)
SGPT (ALT): 18 U/L (ref 12–78)
Sodium: 144 mmol/L (ref 136–145)
Total Protein: 7.5 g/dL (ref 6.4–8.2)

## 2012-09-26 LAB — URINALYSIS, COMPLETE
Bilirubin,UR: NEGATIVE
Blood: NEGATIVE
Glucose,UR: NEGATIVE mg/dL (ref 0–75)
Ketone: NEGATIVE
Nitrite: NEGATIVE
Ph: 7 (ref 4.5–8.0)
Protein: NEGATIVE
RBC,UR: 6 /HPF (ref 0–5)
Specific Gravity: 1.009 (ref 1.003–1.030)
Squamous Epithelial: 2
WBC UR: 6 /HPF (ref 0–5)

## 2012-09-26 LAB — RAPID INFLUENZA A&B ANTIGENS

## 2012-11-26 ENCOUNTER — Emergency Department: Payer: Self-pay | Admitting: Emergency Medicine

## 2013-01-03 ENCOUNTER — Emergency Department: Payer: Self-pay | Admitting: Emergency Medicine

## 2013-01-03 LAB — URINALYSIS, COMPLETE
Bilirubin,UR: NEGATIVE
Blood: NEGATIVE
Glucose,UR: NEGATIVE mg/dL (ref 0–75)
Ketone: NEGATIVE
Nitrite: NEGATIVE
Ph: 5 (ref 4.5–8.0)
Protein: 30
RBC,UR: 3 /HPF (ref 0–5)
Specific Gravity: 1.029 (ref 1.003–1.030)
Squamous Epithelial: 7
WBC UR: 17 /HPF (ref 0–5)

## 2013-01-25 ENCOUNTER — Emergency Department: Payer: Self-pay | Admitting: Emergency Medicine

## 2013-04-01 ENCOUNTER — Emergency Department: Payer: Self-pay | Admitting: Emergency Medicine

## 2013-05-19 ENCOUNTER — Emergency Department: Payer: Self-pay | Admitting: Unknown Physician Specialty

## 2013-05-19 LAB — CBC
HCT: 35.4 % (ref 35.0–47.0)
HGB: 11.9 g/dL — ABNORMAL LOW (ref 12.0–16.0)
MCH: 27.3 pg (ref 26.0–34.0)
MCHC: 33.6 g/dL (ref 32.0–36.0)
MCV: 81 fL (ref 80–100)
Platelet: 285 10*3/uL (ref 150–440)
RBC: 4.35 10*6/uL (ref 3.80–5.20)
RDW: 13.8 % (ref 11.5–14.5)
WBC: 13.7 10*3/uL — ABNORMAL HIGH (ref 3.6–11.0)

## 2013-05-19 LAB — BASIC METABOLIC PANEL
Anion Gap: 5 — ABNORMAL LOW (ref 7–16)
BUN: 17 mg/dL (ref 7–18)
Calcium, Total: 9.2 mg/dL (ref 8.5–10.1)
Chloride: 109 mmol/L — ABNORMAL HIGH (ref 98–107)
Co2: 26 mmol/L (ref 21–32)
Creatinine: 0.54 mg/dL — ABNORMAL LOW (ref 0.60–1.30)
EGFR (African American): >60
EGFR (Non-African Amer.): >60
Glucose: 105 mg/dL — ABNORMAL HIGH (ref 65–99)
Osmolality: 281 (ref 275–301)
Potassium: 3.5 mmol/L (ref 3.5–5.1)
Sodium: 140 mmol/L (ref 136–145)

## 2013-05-19 LAB — URINALYSIS, COMPLETE
Bacteria: NONE SEEN
Bilirubin,UR: NEGATIVE
Blood: NEGATIVE
Glucose,UR: NEGATIVE mg/dL (ref 0–75)
Ketone: NEGATIVE
Leukocyte Esterase: NEGATIVE
Nitrite: NEGATIVE
Ph: 6 (ref 4.5–8.0)
Protein: NEGATIVE
RBC,UR: <1 /HPF (ref 0–5)
Specific Gravity: 1.013 (ref 1.003–1.030)
Squamous Epithelial: <1
WBC UR: 1 /HPF (ref 0–5)

## 2013-07-04 ENCOUNTER — Ambulatory Visit: Payer: Self-pay | Admitting: Internal Medicine

## 2013-07-25 ENCOUNTER — Ambulatory Visit: Payer: Self-pay

## 2013-12-09 ENCOUNTER — Ambulatory Visit: Payer: Medicaid Other

## 2013-12-10 ENCOUNTER — Ambulatory Visit: Payer: Medicaid Other | Attending: Internal Medicine | Admitting: Internal Medicine

## 2013-12-10 ENCOUNTER — Encounter: Payer: Self-pay | Admitting: Internal Medicine

## 2013-12-10 ENCOUNTER — Encounter (INDEPENDENT_AMBULATORY_CARE_PROVIDER_SITE_OTHER): Payer: Self-pay

## 2013-12-10 VITALS — BP 131/93 | HR 65 | Temp 98.9°F | Resp 14 | Ht 64.0 in | Wt 228.2 lb

## 2013-12-10 DIAGNOSIS — R300 Dysuria: Secondary | ICD-10-CM

## 2013-12-10 DIAGNOSIS — I10 Essential (primary) hypertension: Secondary | ICD-10-CM | POA: Insufficient documentation

## 2013-12-10 DIAGNOSIS — R3915 Urgency of urination: Secondary | ICD-10-CM | POA: Insufficient documentation

## 2013-12-10 DIAGNOSIS — E119 Type 2 diabetes mellitus without complications: Secondary | ICD-10-CM

## 2013-12-10 DIAGNOSIS — J45909 Unspecified asthma, uncomplicated: Secondary | ICD-10-CM | POA: Insufficient documentation

## 2013-12-10 DIAGNOSIS — M1990 Unspecified osteoarthritis, unspecified site: Secondary | ICD-10-CM | POA: Insufficient documentation

## 2013-12-10 DIAGNOSIS — Z09 Encounter for follow-up examination after completed treatment for conditions other than malignant neoplasm: Secondary | ICD-10-CM | POA: Insufficient documentation

## 2013-12-10 DIAGNOSIS — K219 Gastro-esophageal reflux disease without esophagitis: Secondary | ICD-10-CM | POA: Insufficient documentation

## 2013-12-10 LAB — CBC WITH DIFFERENTIAL/PLATELET
Basophils Absolute: 0 10*3/uL (ref 0.0–0.1)
Basophils Relative: 0 % (ref 0–1)
Eosinophils Absolute: 0.1 10*3/uL (ref 0.0–0.7)
Eosinophils Relative: 1 % (ref 0–5)
HCT: 37.8 % (ref 36.0–46.0)
Hemoglobin: 12.6 g/dL (ref 12.0–15.0)
Lymphocytes Relative: 30 % (ref 12–46)
Lymphs Abs: 1.7 10*3/uL (ref 0.7–4.0)
MCH: 26.7 pg (ref 26.0–34.0)
MCHC: 33.3 g/dL (ref 30.0–36.0)
MCV: 80.1 fL (ref 78.0–100.0)
Monocytes Absolute: 0.4 10*3/uL (ref 0.1–1.0)
Monocytes Relative: 7 % (ref 3–12)
Neutro Abs: 3.5 10*3/uL (ref 1.7–7.7)
Neutrophils Relative %: 62 % (ref 43–77)
Platelets: 321 10*3/uL (ref 150–400)
RBC: 4.72 MIL/uL (ref 3.87–5.11)
RDW: 14.4 % (ref 11.5–15.5)
WBC: 5.6 10*3/uL (ref 4.0–10.5)

## 2013-12-10 LAB — LIPID PANEL
Cholesterol: 161 mg/dL (ref 0–200)
HDL: 39 mg/dL — ABNORMAL LOW (ref >39)
LDL Cholesterol: 108 mg/dL — ABNORMAL HIGH (ref 0–99)
Total CHOL/HDL Ratio: 4.1 Ratio
Triglycerides: 69 mg/dL (ref <150)
VLDL: 14 mg/dL (ref 0–40)

## 2013-12-10 LAB — COMPREHENSIVE METABOLIC PANEL
ALT: 48 U/L — ABNORMAL HIGH (ref 0–35)
AST: 20 U/L (ref 0–37)
Albumin: 4.1 g/dL (ref 3.5–5.2)
Alkaline Phosphatase: 73 U/L (ref 39–117)
BUN: 10 mg/dL (ref 6–23)
CO2: 28 mEq/L (ref 19–32)
Calcium: 9.7 mg/dL (ref 8.4–10.5)
Chloride: 107 mEq/L (ref 96–112)
Creat: 0.48 mg/dL — ABNORMAL LOW (ref 0.50–1.10)
Glucose, Bld: 76 mg/dL (ref 70–99)
Potassium: 3.5 mEq/L (ref 3.5–5.3)
Sodium: 142 mEq/L (ref 135–145)
Total Bilirubin: 0.6 mg/dL (ref 0.2–1.2)
Total Protein: 6.3 g/dL (ref 6.0–8.3)

## 2013-12-10 LAB — POCT GLYCOSYLATED HEMOGLOBIN (HGB A1C): Hemoglobin A1C: 5.5

## 2013-12-10 LAB — TSH: TSH: 1.919 u[IU]/mL (ref 0.350–4.500)

## 2013-12-10 NOTE — Progress Notes (Signed)
Patient is here to establish care. Complains of lower back and neck pain x1 month; comes and goes, pain scale of 9 today. Also complains of vaginal odor and burning while urination x1 month; no discharge. Taking Advil OTC for pain; helps little. Also complains of a pressure headache in forehead; slight dizziness.

## 2013-12-10 NOTE — Progress Notes (Signed)
Patient ID: [name], female   DOB: 02-23-1966, 48 y.o.   MRN: [id number]  CC: follow up  HPI: Pt is 48 yo female with HTN who presents to clinic for follow up. She reports no concerns today and says she is here to make sure she gets blood work done. She denies fevers, chills, no specific abdominal or urinary concerns except for chronic urinary urgency and burning with urination.   No Known Allergies Past Medical History  Diagnosis Date  . Hypertension   . Asthma   . Gastric reflux   . Syncope and collapse   . Osteoarthritis    Current Outpatient Prescriptions on File Prior to Visit  Medication Sig Dispense Refill  . albuterol (PROVENTIL HFA;VENTOLIN HFA) 108 (90 BASE) MCG/ACT inhaler Inhale 2 puffs into the lungs 2 (two) times daily.      Marland Kitchen atenolol (TENORMIN) 25 MG tablet Take 25 mg by mouth daily.      . budesonide-formoterol (SYMBICORT) 160-4.5 MCG/ACT inhaler Inhale 2 puffs into the lungs 2 (two) times daily.      . cetirizine (ZYRTEC) 10 MG tablet Take 10 mg by mouth daily.      . cyclobenzaprine (FLEXERIL) 10 MG tablet Take 10 mg by mouth 2 (two) times daily as needed.      Marland Kitchen ibuprofen (ADVIL,MOTRIN) 600 MG tablet Take 600 mg by mouth every 8 (eight) hours as needed.      Marland Kitchen losartan (COZAAR) 100 MG tablet Take 100 mg by mouth daily.      . NON FORMULARY Oxygen 2 Liters at bedtime.      Marland Kitchen omeprazole (PRILOSEC) 40 MG capsule Take 40 mg by mouth daily.       No current facility-administered medications on file prior to visit.   No known family medical history  History   Social History  . Marital Status: Married    Spouse Name: N/A    Number of Children: N/A  . Years of Education: N/A   Occupational History  . Not on file.   Social History Main Topics  . Smoking status: Never Smoker   . Smokeless tobacco: Not on file  . Alcohol Use: No  . Drug Use: No  . Sexual Activity:    Other Topics Concern  . Not on file   Social History Narrative  . No narrative on  file    Review of Systems  Constitutional: Negative for fever, chills, diaphoresis, activity change, appetite change and fatigue.  HENT: Negative for ear pain, nosebleeds, congestion, facial swelling, rhinorrhea, neck pain, neck stiffness and ear discharge.   Eyes: Negative for pain, discharge, redness, itching and visual disturbance.  Respiratory: Negative for cough, choking, chest tightness, shortness of breath, wheezing and stridor.   Cardiovascular: Negative for chest pain, palpitations and leg swelling.  Gastrointestinal: Negative for abdominal distention.  Genitourinary: Negative for hematuria, flank pain, decreased urine volume, difficulty urinating and dyspareunia.  Musculoskeletal: Negative for back pain, joint swelling, arthralgias and gait problem.  Neurological: Negative for dizziness, tremors, seizures, syncope, facial asymmetry, speech difficulty, weakness, light-headedness, numbness and headaches.  Hematological: Negative for adenopathy. Does not bruise/bleed easily.  Psychiatric/Behavioral: Negative for hallucinations, behavioral problems, confusion, dysphoric mood, decreased concentration and agitation.    Objective:   Filed Vitals:   12/10/13 1009  BP: 131/93  Pulse: 65  Temp: 98.9 F (37.2 C)  Resp: 14    Physical Exam  Constitutional: Appears well-developed and well-nourished. No distress.  HENT: Normocephalic. External right and left ear  normal. Oropharynx is clear and moist.  Eyes: Conjunctivae and EOM are normal. PERRLA, no scleral icterus.  Neck: Normal ROM. Neck supple. No JVD. No tracheal deviation. No thyromegaly.  CVS: RRR, S1/S2 +, no murmurs, no gallops, no carotid bruit.  Pulmonary: Effort and breath sounds normal, no stridor, rhonchi, wheezes, rales.  Abdominal: Soft. BS +,  no distension, tenderness, rebound or guarding.  Musculoskeletal: Normal range of motion. No edema and no tenderness.  Lymphadenopathy: No lymphadenopathy noted, cervical,  inguinal. Neuro: Alert. Normal reflexes, muscle tone coordination. No cranial nerve deficit. Skin: Skin is warm and dry. No rash noted. Not diaphoretic. No erythema. No pallor.  Psychiatric: Normal mood and affect. Behavior, judgment, thought content normal.   Lab Results  Component Value Date   WBC 4.5 06/04/2012   HGB 12.2 06/04/2012   HCT 37.7 06/04/2012   MCV 84 06/04/2012   PLT 242 04/04/2012   Lab Results  Component Value Date   CREATININE 0.57 06/04/2012   BUN 10 06/04/2012   NA 142 06/04/2012   K 3.5 06/04/2012   CL 106 06/04/2012   CO2 24 06/04/2012    No results found for this basename: HGBA1C   Lipid Panel  No results found for this basename: chol, trig, hdl, cholhdl, vldl, ldlcalc      Assessment and plan:   Patient Active Problem List   Diagnosis Date Noted  . Urinary urgency and dysuria - will ask for UA and will treat with ABX if UA suspecting UTI - we have discussed hygiene and plenty of fluids - check A1C  06/06/2012  . HTN - will ask for CMET, CBC, TSH, lipid panel 06/06/2012

## 2013-12-11 ENCOUNTER — Telehealth: Payer: Self-pay | Admitting: Emergency Medicine

## 2013-12-11 ENCOUNTER — Telehealth: Payer: Self-pay | Admitting: Internal Medicine

## 2013-12-11 ENCOUNTER — Telehealth: Payer: Self-pay

## 2013-12-11 LAB — URINALYSIS, ROUTINE W REFLEX MICROSCOPIC
Bilirubin Urine: NEGATIVE
Glucose, UA: NEGATIVE mg/dL
Hgb urine dipstick: NEGATIVE
Ketones, ur: NEGATIVE mg/dL
Leukocytes, UA: NEGATIVE
Nitrite: NEGATIVE
Protein, ur: NEGATIVE mg/dL
Specific Gravity, Urine: 1.011 (ref 1.005–1.030)
Urobilinogen, UA: 0.2 mg/dL (ref 0.0–1.0)
pH: 7 (ref 5.0–8.0)

## 2013-12-11 NOTE — Telephone Encounter (Signed)
Pt called regarding her results, pt states that she is in a lot of pain, please contact pt as soon as possible

## 2013-12-11 NOTE — Telephone Encounter (Signed)
Patient called wanting her lab results Please review and advise

## 2013-12-11 NOTE — Telephone Encounter (Signed)
Left message for pt to call clinic

## 2013-12-12 ENCOUNTER — Telehealth: Payer: Self-pay | Admitting: Emergency Medicine

## 2013-12-12 NOTE — Telephone Encounter (Signed)
Pt called in c/o continued strong odor from urine with back pain. Urine dipstick/culture negative Pt given f/u appt 12/16/13 2 1130am Informed to go to UC if sx worsens with n/v/fevers

## 2013-12-16 ENCOUNTER — Ambulatory Visit: Payer: Medicaid Other | Admitting: Internal Medicine

## 2013-12-23 ENCOUNTER — Ambulatory Visit: Payer: Medicaid Other | Admitting: Internal Medicine

## 2013-12-24 ENCOUNTER — Telehealth: Payer: Self-pay | Admitting: *Deleted

## 2013-12-24 NOTE — Telephone Encounter (Signed)
error 

## 2014-01-03 ENCOUNTER — Ambulatory Visit: Payer: Medicaid Other | Attending: Internal Medicine | Admitting: Internal Medicine

## 2014-01-03 VITALS — BP 142/84 | HR 67 | Temp 98.3°F | Resp 16 | Ht 64.0 in | Wt 225.0 lb

## 2014-01-03 DIAGNOSIS — R1013 Epigastric pain: Secondary | ICD-10-CM

## 2014-01-03 DIAGNOSIS — G8929 Other chronic pain: Secondary | ICD-10-CM

## 2014-01-03 LAB — POCT URINALYSIS DIPSTICK
Bilirubin, UA: NEGATIVE
Glucose, UA: NEGATIVE
Ketones, UA: NEGATIVE
Nitrite, UA: NEGATIVE
Protein, UA: NEGATIVE
Spec Grav, UA: 1.025
Urobilinogen, UA: 0.2
pH, UA: 6.5

## 2014-01-03 MED ORDER — PANTOPRAZOLE SODIUM 40 MG PO TBEC: 40.0000 mg | DELAYED_RELEASE_TABLET | Freq: Every day | ORAL | Status: DC

## 2014-01-03 MED ORDER — DICYCLOMINE HCL 10 MG PO CAPS: 10.0000 mg | ORAL_CAPSULE | Freq: Three times a day (TID) | ORAL | Status: DC

## 2014-01-03 NOTE — Progress Notes (Signed)
Patient ID: [name], female   DOB: 11-28-1965, 48 y.o.   MRN: [id number]   CC:  HPI: 48 year old female comes in with chief complaint of epigastric pain without radiation. Discomfort is worsened after meals. The patient experiences flatulence and bloating and has to have a bowel movement after every meal. She takes ibuprofen occasionally, status post cholecystectomy for gallstones. Denies any associated nausea or vomiting. Patient recently had labs done on 3/15 which were completely normal. UA was negative. She had a CT scan back in 2010 showed concern for carcinoid tumor, possible lymphoma. Patient claims that her symptoms have been persistent on and off but worse over the last few months. She has never had a colonoscopy and endoscopy. She denies hematochezia melena. She has gained weight.   No Known Allergies Past Medical History  Diagnosis Date  . Hypertension   . Asthma   . Gastric reflux   . Syncope and collapse   . Osteoarthritis    Current Outpatient Prescriptions on File Prior to Visit  Medication Sig Dispense Refill  . albuterol (PROVENTIL HFA;VENTOLIN HFA) 108 (90 BASE) MCG/ACT inhaler Inhale 2 puffs into the lungs 2 (two) times daily.      Marland Kitchen atenolol (TENORMIN) 25 MG tablet Take 25 mg by mouth daily.      . budesonide-formoterol (SYMBICORT) 160-4.5 MCG/ACT inhaler Inhale 2 puffs into the lungs 2 (two) times daily.      Marland Kitchen EPINEPHrine (ADRENALIN) 0.1 % nasal solution Place 1 drop into the nose as needed (auto-injection).      Marland Kitchen esomeprazole (NEXIUM) 40 MG capsule Take 40 mg by mouth daily at 12 noon.      . cetirizine (ZYRTEC) 10 MG tablet Take 10 mg by mouth daily.      . cyclobenzaprine (FLEXERIL) 10 MG tablet Take 10 mg by mouth 2 (two) times daily as needed.      Marland Kitchen losartan (COZAAR) 100 MG tablet Take 100 mg by mouth daily.      . NON FORMULARY Oxygen 2 Liters at bedtime.      Marland Kitchen omeprazole (PRILOSEC) 40 MG capsule Take 40 mg by mouth daily.       No current  facility-administered medications on file prior to visit.   History reviewed. No pertinent family history. History   Social History  . Marital Status: Married    Spouse Name: N/A    Number of Children: N/A  . Years of Education: N/A   Occupational History  . Not on file.   Social History Main Topics  . Smoking status: Never Smoker   . Smokeless tobacco: Not on file  . Alcohol Use: No  . Drug Use: No  . Sexual Activity:    Other Topics Concern  . Not on file   Social History Narrative  . No narrative on file    Review of Systems  Constitutional: Negative for fever, chills, diaphoresis, activity change, appetite change and fatigue.  HENT: Negative for ear pain, nosebleeds, congestion, facial swelling, rhinorrhea, neck pain, neck stiffness and ear discharge.   Eyes: Negative for pain, discharge, redness, itching and visual disturbance.  Respiratory: Negative for cough, choking, chest tightness, shortness of breath, wheezing and stridor.   Cardiovascular: Negative for chest pain, palpitations and leg swelling.  Gastrointestinal: As in history of present illness  Genitourinary: Negative for dysuria, urgency, frequency, hematuria, flank pain, decreased urine volume, difficulty urinating and dyspareunia.  Musculoskeletal: Negative for back pain, joint swelling, arthralgias and gait problem.  Neurological: Negative for dizziness,  tremors, seizures, syncope, facial asymmetry, speech difficulty, weakness, light-headedness, numbness and headaches.  Hematological: Negative for adenopathy. Does not bruise/bleed easily.  Psychiatric/Behavioral: Negative for hallucinations, behavioral problems, confusion, dysphoric mood, decreased concentration and agitation.    Objective:   Filed Vitals:   01/03/14 1526  BP: 142/84  Pulse: 67  Temp: 98.3 F (36.8 C)  Resp: 16    Physical Exam  Constitutional: Appears well-developed and well-nourished. No distress.  HENT: Normocephalic.  External right and left ear normal. Oropharynx is clear and moist.  Eyes: Conjunctivae and EOM are normal. PERRLA, no scleral icterus.  Neck: Normal ROM. Neck supple. No JVD. No tracheal deviation. No thyromegaly.  CVS: RRR, S1/S2 +, no murmurs, no gallops, no carotid bruit.  Pulmonary: Effort and breath sounds normal, no stridor, rhonchi, wheezes, rales.  Abdominal: Soft. BS +,  no distension, tenderness, rebound or guarding.  Musculoskeletal: Normal range of motion. No edema and no tenderness.  Lymphadenopathy: No lymphadenopathy noted, cervical, inguinal. Neuro: Alert. Normal reflexes, muscle tone coordination. No cranial nerve deficit. Skin: Skin is warm and dry. No rash noted. Not diaphoretic. No erythema. No pallor.  Psychiatric: Normal mood and affect. Behavior, judgment, thought content normal.   Lab Results  Component Value Date   WBC 5.6 12/10/2013   HGB 12.6 12/10/2013   HCT 37.8 12/10/2013   MCV 80.1 12/10/2013   PLT 321 12/10/2013   Lab Results  Component Value Date   CREATININE 0.48* 12/10/2013   BUN 10 12/10/2013   NA 142 12/10/2013   K 3.5 12/10/2013   CL 107 12/10/2013   CO2 28 12/10/2013    Lab Results  Component Value Date   HGBA1C 5.5 12/10/2013   Lipid Panel     Component Value Date/Time   CHOL 161 12/10/2013 1032   TRIG 69 12/10/2013 1032   HDL 39* 12/10/2013 1032   CHOLHDL 4.1 12/10/2013 1032   VLDL 14 12/10/2013 1032   LDLCALC 108* 12/10/2013 1032       Assessment and plan:   Patient Active Problem List   Diagnosis Date Noted  . Chest pain 06/06/2012  . Murmur, cardiac 06/06/2012   Epigastric abdominal pain Because of her previous concerning findings on the CT scan the CT scan of the abdomen and pelvis, rule out mass, obstruction, colitis, Empiric Protonix and Bentyl for possible gastritis as well as irritable bowel syndrome If symptoms do not resolve in the next few weeks and the patient will be referred to GI for further evaluation  Patient to follow up in 4-6 weeks       The patient was given clear instructions to go to ER or return to medical center if symptoms don't improve, worsen or new problems develop. The patient verbalized understanding. The patient was told to call to get any lab results if not heard anything in the next week.

## 2014-01-03 NOTE — Progress Notes (Signed)
Pt is here following up on her severe abdomen pain.

## 2014-01-03 NOTE — Addendum Note (Signed)
Addended by: Susie CassetteABROL MD, Germain OsgoodNAYANA on: 01/03/2014 04:21 PM   Modules accepted: Orders, Medications

## 2014-01-06 ENCOUNTER — Telehealth: Payer: Self-pay | Admitting: Internal Medicine

## 2014-01-06 NOTE — Telephone Encounter (Signed)
Pt was here on Friday, 01/03/14, and says was supposed to receive refill for meds including bp meds.

## 2014-01-08 ENCOUNTER — Telehealth: Payer: Self-pay | Admitting: Emergency Medicine

## 2014-01-08 NOTE — Telephone Encounter (Signed)
Script waiting in pharmacy

## 2014-01-14 ENCOUNTER — Ambulatory Visit: Payer: Medicaid Other | Attending: Internal Medicine | Admitting: Internal Medicine

## 2014-01-14 VITALS — BP 126/85 | HR 60 | Temp 98.5°F | Resp 16 | Ht 64.0 in | Wt 218.0 lb

## 2014-01-14 DIAGNOSIS — R1013 Epigastric pain: Secondary | ICD-10-CM

## 2014-01-14 LAB — COMPLETE METABOLIC PANEL WITH GFR
ALT: 24 U/L (ref 0–35)
AST: 13 U/L (ref 0–37)
Albumin: 3.9 g/dL (ref 3.5–5.2)
Alkaline Phosphatase: 69 U/L (ref 39–117)
BUN: 10 mg/dL (ref 6–23)
CO2: 28 mEq/L (ref 19–32)
Calcium: 9.6 mg/dL (ref 8.4–10.5)
Chloride: 109 mEq/L (ref 96–112)
Creat: 0.55 mg/dL (ref 0.50–1.10)
GFR, Est African American: >89 mL/min
GFR, Est Non African American: >89 mL/min
Glucose, Bld: 86 mg/dL (ref 70–99)
Potassium: 3.2 mEq/L — ABNORMAL LOW (ref 3.5–5.3)
Sodium: 147 mEq/L — ABNORMAL HIGH (ref 135–145)
Total Bilirubin: 0.7 mg/dL (ref 0.2–1.2)
Total Protein: 6.2 g/dL (ref 6.0–8.3)

## 2014-01-14 LAB — LIPASE: Lipase: <10 U/L (ref 0–75)

## 2014-01-14 MED ORDER — ONDANSETRON HCL 4 MG PO TABS: 4.0000 mg | ORAL_TABLET | Freq: Three times a day (TID) | ORAL | Status: DC | PRN

## 2014-01-14 MED ORDER — ACETAMINOPHEN-CODEINE #2 300-15 MG PO TABS: 1.0000 | ORAL_TABLET | ORAL | Status: DC | PRN

## 2014-01-14 NOTE — Progress Notes (Signed)
Patient ID: [name], female   DOB: 12/14/1965, 48 y.o.   MRN: [id number]   CC:  HPI: Patient seen in followup for abdominal pain last seen on 3/27 and prescribed Protonix and Bentyl. Patient states that the pain is progressively getting worse. She continues to have regular bowel movements a day no diarrhea no constipation Denies any hematochezia melena, complains of postprandial nausea, fullness, loss of appetite. No weight loss CT scan 10/16/08 showed the following Ill-defined soft tissue density at the base of the small bowel  mesentery. This raises concern for carcinoid tumor. This does not  have the typical cicatrization. Lymphoma or small bowel metastases  are also considered. Fibrosing mesenteritis or desmoid tumor is  also considered in the differential diagnosis. Repeat CT scan pending on 01/16/14    No Known Allergies Past Medical History  Diagnosis Date  . Hypertension   . Asthma   . Gastric reflux   . Syncope and collapse   . Osteoarthritis    Current Outpatient Prescriptions on File Prior to Visit  Medication Sig Dispense Refill  . albuterol (PROVENTIL HFA;VENTOLIN HFA) 108 (90 BASE) MCG/ACT inhaler Inhale 2 puffs into the lungs 2 (two) times daily.      Marland Kitchen. atenolol (TENORMIN) 25 MG tablet Take 25 mg by mouth daily.      . budesonide-formoterol (SYMBICORT) 160-4.5 MCG/ACT inhaler Inhale 2 puffs into the lungs 2 (two) times daily.      Marland Kitchen. dicyclomine (BENTYL) 10 MG capsule Take 1 capsule (10 mg total) by mouth 4 (four) times daily -  before meals and at bedtime.  90 capsule  1  . pantoprazole (PROTONIX) 40 MG tablet Take 1 tablet (40 mg total) by mouth daily.  30 tablet  3  . cetirizine (ZYRTEC) 10 MG tablet Take 10 mg by mouth daily.      . cyclobenzaprine (FLEXERIL) 10 MG tablet Take 10 mg by mouth 2 (two) times daily as needed.      Marland Kitchen. EPINEPHrine (ADRENALIN) 0.1 % nasal solution Place 1 drop into the nose as needed (auto-injection).      Marland Kitchen. losartan (COZAAR) 100 MG  tablet Take 100 mg by mouth daily.      . NON FORMULARY Oxygen 2 Liters at bedtime.       No current facility-administered medications on file prior to visit.   History reviewed. No pertinent family history. History   Social History  . Marital Status: Single    Spouse Name: N/A    Number of Children: N/A  . Years of Education: N/A   Occupational History  . Not on file.   Social History Main Topics  . Smoking status: Never Smoker   . Smokeless tobacco: Not on file  . Alcohol Use: No  . Drug Use: No  . Sexual Activity:    Other Topics Concern  . Not on file   Social History Narrative  . No narrative on file    Review of Systems  Constitutional: Negative for fever, chills, diaphoresis, activity change, appetite change and fatigue.  HENT: Negative for ear pain, nosebleeds, congestion, facial swelling, rhinorrhea, neck pain, neck stiffness and ear discharge.   Eyes: Negative for pain, discharge, redness, itching and visual disturbance.  Respiratory: Negative for cough, choking, chest tightness, shortness of breath, wheezing and stridor.   Cardiovascular: Negative for chest pain, palpitations and leg swelling.  Gastrointestinal: As in history of present illness Genitourinary: Negative for dysuria, urgency, frequency, hematuria, flank pain, decreased urine volume, difficulty urinating and  dyspareunia.  Musculoskeletal: Negative for back pain, joint swelling, arthralgias and gait problem.  Neurological: Negative for dizziness, tremors, seizures, syncope, facial asymmetry, speech difficulty, weakness, light-headedness, numbness and headaches.  Hematological: Negative for adenopathy. Does not bruise/bleed easily.  Psychiatric/Behavioral: Negative for hallucinations, behavioral problems, confusion, dysphoric mood, decreased concentration and agitation.    Objective:   Filed Vitals:   01/14/14 1133  BP: 126/85  Pulse: 60  Temp: 98.5 F (36.9 C)  Resp: 16    Physical Exam   Constitutional: Appears well-developed and well-nourished. No distress.  HENT: Normocephalic. External right and left ear normal. Oropharynx is clear and moist.  Eyes: Conjunctivae and EOM are normal. PERRLA, no scleral icterus.  Neck: Normal ROM. Neck supple. No JVD. No tracheal deviation. No thyromegaly.  CVS: RRR, S1/S2 +, no murmurs, no gallops, no carotid bruit.  Pulmonary: Effort and breath sounds normal, no stridor, rhonchi, wheezes, rales.  Abdominal: Soft. BS +,  no distension, positive for epigastric tenderness, no rebound or guarding.  Musculoskeletal: Normal range of motion. No edema and no tenderness.  Lymphadenopathy: No lymphadenopathy noted, cervical, inguinal. Neuro: Alert. Normal reflexes, muscle tone coordination. No cranial nerve deficit. Skin: Skin is warm and dry. No rash noted. Not diaphoretic. No erythema. No pallor.  Psychiatric: Normal mood and affect. Behavior, judgment, thought content normal.   Lab Results  Component Value Date   WBC 5.6 12/10/2013   HGB 12.6 12/10/2013   HCT 37.8 12/10/2013   MCV 80.1 12/10/2013   PLT 321 12/10/2013   Lab Results  Component Value Date   CREATININE 0.48* 12/10/2013   BUN 10 12/10/2013   NA 142 12/10/2013   K 3.5 12/10/2013   CL 107 12/10/2013   CO2 28 12/10/2013    Lab Results  Component Value Date   HGBA1C 5.5 12/10/2013   Lipid Panel     Component Value Date/Time   CHOL 161 12/10/2013 1032   TRIG 69 12/10/2013 1032   HDL 39* 12/10/2013 1032   CHOLHDL 4.1 12/10/2013 1032   VLDL 14 12/10/2013 1032   LDLCALC 108* 12/10/2013 1032       Assessment and plan:   Patient Active Problem List   Diagnosis Date Noted  . Chest pain 06/06/2012  . Murmur, cardiac 06/06/2012   Epigastric pain Will check lipase, CMP UA negative on 3/27 Gastroenterology consult for possible EGD Patient is status post cholecystectomy Continue Protonix, Bentyl Zofran for postprandial nausea, Tylenol with codeine for pain CT scan on 01/16/14 Patient to followup  soon after         The patient was given clear instructions to go to ER or return to medical center if symptoms don't improve, worsen or new problems develop. The patient verbalized understanding. The patient was told to call to get any lab results if not heard anything in the next week.

## 2014-01-15 LAB — HELICOBACTER PYLORI  ANTIBODY, IGM: Helicobacter pylori, IgM: 3.1 U/mL (ref <9.0)

## 2014-01-15 LAB — H. PYLORI ANTIBODY, IGG: H Pylori IgG: 0.61 {ISR}

## 2014-01-16 ENCOUNTER — Telehealth: Payer: Self-pay | Admitting: Internal Medicine

## 2014-01-16 ENCOUNTER — Ambulatory Visit (HOSPITAL_COMMUNITY)
Admission: RE | Admit: 2014-01-16 | Discharge: 2014-01-16 | Disposition: A | Discharge status: Home / Self Care | Payer: Medicaid Other | Source: Ambulatory Visit | Attending: Internal Medicine | Admitting: Internal Medicine

## 2014-01-16 DIAGNOSIS — R1013 Epigastric pain: Secondary | ICD-10-CM | POA: Insufficient documentation

## 2014-01-16 DIAGNOSIS — G8929 Other chronic pain: Secondary | ICD-10-CM

## 2014-01-16 MED ORDER — IOHEXOL 300 MG/ML  SOLN
80.0000 mL | Freq: Once | INTRAMUSCULAR | Status: AC | PRN
  Administered 2014-01-16: 80 mL via INTRAVENOUS

## 2014-01-16 NOTE — Telephone Encounter (Signed)
Pt calling to say that she would like her lab results to be given to her sister over the phone, [phone].

## 2014-01-17 ENCOUNTER — Telehealth: Payer: Self-pay | Admitting: Emergency Medicine

## 2014-01-17 NOTE — Telephone Encounter (Signed)
Pt calling again about results.  

## 2014-01-17 NOTE — Telephone Encounter (Signed)
Pt called in requesting CT scan and blood work results. Please f/u

## 2014-01-17 NOTE — Telephone Encounter (Signed)
Spoke with pt sister Vernia BuffRobin Brown to give results CT scan and blood work. Informed sister Referral GYN/GI placed and to await scheduled appt

## 2014-01-17 NOTE — Addendum Note (Signed)
Addended by: Susie CassetteABROL MD, Germain OsgoodNAYANA on: 01/17/2014 03:10 PM   Modules accepted: Orders

## 2014-01-20 ENCOUNTER — Telehealth: Payer: Self-pay

## 2014-01-20 ENCOUNTER — Telehealth: Payer: Self-pay | Admitting: Internal Medicine

## 2014-01-20 ENCOUNTER — Telehealth: Payer: Self-pay | Admitting: Emergency Medicine

## 2014-01-20 NOTE — Telephone Encounter (Signed)
Care giverJoya Gaskins( Robin Allen)  called to ask for GYN referral. Also Mrs. Freida Busmanllen stated that is very worry because pt is not eating well since last Wed, from that day pt just been eating cup of fruit and ensure. Mrs. Freida Busmanllen stated that pt looks depressive.  Mrs. Allen appreciate if nurse or doctor call her back.   Marines

## 2014-01-20 NOTE — Telephone Encounter (Signed)
Left a message for pt to call for lab results

## 2014-01-20 NOTE — Telephone Encounter (Signed)
Spoke with patient regarding her results from her CT scan She is aware a referral was put into system for gynecology And that they will call her with the appointment

## 2014-01-20 NOTE — Telephone Encounter (Signed)
Message copied by Darlis LoanSMITH, Tameshia Bonneville D on Mon Jan 20, 2014  5:44 PM ------      Message from: Susie CassetteABROL MD, Germain OsgoodNAYANA      Created: Fri Jan 17, 2014  3:36 PM       Notify patient that all labs are negative including lipase, liver function, H. pylori. The patient needs to start hydrating herself with water, as her sodium is mildly elevated ------

## 2014-01-28 ENCOUNTER — Encounter: Payer: Self-pay | Admitting: Obstetrics & Gynecology

## 2014-02-05 ENCOUNTER — Other Ambulatory Visit: Payer: Self-pay | Admitting: Gastroenterology

## 2014-02-05 ENCOUNTER — Encounter (HOSPITAL_COMMUNITY): Payer: Self-pay | Admitting: Pharmacy Technician

## 2014-02-13 ENCOUNTER — Encounter (HOSPITAL_COMMUNITY): Payer: Self-pay | Admitting: *Deleted

## 2014-02-26 ENCOUNTER — Encounter: Payer: Medicaid Other | Admitting: Obstetrics & Gynecology

## 2014-02-27 ENCOUNTER — Encounter (HOSPITAL_COMMUNITY)
Admission: RE | Disposition: A | Discharge status: Home / Self Care | Payer: Self-pay | Source: Ambulatory Visit | Attending: Gastroenterology

## 2014-02-27 ENCOUNTER — Ambulatory Visit (HOSPITAL_COMMUNITY)
Admission: RE | Admit: 2014-02-27 | Discharge: 2014-02-27 | Disposition: A | Discharge status: Home / Self Care | Payer: Medicaid Other | Source: Ambulatory Visit | Attending: Gastroenterology | Admitting: Gastroenterology

## 2014-02-27 ENCOUNTER — Encounter (HOSPITAL_COMMUNITY): Payer: Medicaid Other | Admitting: Anesthesiology

## 2014-02-27 ENCOUNTER — Ambulatory Visit (HOSPITAL_COMMUNITY): Payer: Medicaid Other | Admitting: Anesthesiology

## 2014-02-27 DIAGNOSIS — Z6841 Body Mass Index (BMI) 40.0 and over, adult: Secondary | ICD-10-CM | POA: Insufficient documentation

## 2014-02-27 DIAGNOSIS — R1013 Epigastric pain: Secondary | ICD-10-CM | POA: Insufficient documentation

## 2014-02-27 DIAGNOSIS — M1990 Unspecified osteoarthritis, unspecified site: Secondary | ICD-10-CM | POA: Insufficient documentation

## 2014-02-27 DIAGNOSIS — Z79899 Other long term (current) drug therapy: Secondary | ICD-10-CM | POA: Insufficient documentation

## 2014-02-27 DIAGNOSIS — G4730 Sleep apnea, unspecified: Secondary | ICD-10-CM | POA: Insufficient documentation

## 2014-02-27 DIAGNOSIS — R197 Diarrhea, unspecified: Secondary | ICD-10-CM | POA: Insufficient documentation

## 2014-02-27 DIAGNOSIS — J45909 Unspecified asthma, uncomplicated: Secondary | ICD-10-CM | POA: Insufficient documentation

## 2014-02-27 DIAGNOSIS — I10 Essential (primary) hypertension: Secondary | ICD-10-CM | POA: Insufficient documentation

## 2014-02-27 DIAGNOSIS — K219 Gastro-esophageal reflux disease without esophagitis: Secondary | ICD-10-CM | POA: Insufficient documentation

## 2014-02-27 HISTORY — DX: Sleep apnea, unspecified: G47.30

## 2014-02-27 HISTORY — PX: ESOPHAGOGASTRODUODENOSCOPY (EGD) WITH PROPOFOL: SHX5813

## 2014-02-27 HISTORY — PX: COLONOSCOPY WITH PROPOFOL: SHX5780

## 2014-02-27 HISTORY — DX: Personal history of other medical treatment: Z92.89

## 2014-02-27 SURGERY — COLONOSCOPY WITH PROPOFOL
Anesthesia: Monitor Anesthesia Care

## 2014-02-27 MED ORDER — PROPOFOL 10 MG/ML IV BOLUS
INTRAVENOUS | Status: AC
  Filled 2014-02-27: qty 20

## 2014-02-27 MED ORDER — PROPOFOL INFUSION 10 MG/ML OPTIME
INTRAVENOUS | Status: DC | PRN
  Administered 2014-02-27: 300 ug/kg/min via INTRAVENOUS

## 2014-02-27 MED ORDER — LACTATED RINGERS IV SOLN
INTRAVENOUS | Status: DC | PRN
  Administered 2014-02-27: 11:00:00 via INTRAVENOUS

## 2014-02-27 MED ORDER — BUTAMBEN-TETRACAINE-BENZOCAINE 2-2-14 % EX AERO
INHALATION_SPRAY | CUTANEOUS | Status: DC | PRN
  Administered 2014-02-27: 1 via TOPICAL

## 2014-02-27 SURGICAL SUPPLY — 25 items

## 2014-02-27 NOTE — Transfer of Care (Signed)
Immediate Anesthesia Transfer of Care Note  Patient: Jenna LernerBetsy [name]  Procedure(s) Performed: Procedure(s): COLONOSCOPY WITH PROPOFOL (N/A) ESOPHAGOGASTRODUODENOSCOPY (EGD) WITH PROPOFOL (N/A)  Patient Location: PACU and Endoscopy Unit  Anesthesia Type:MAC  Level of Consciousness: awake and patient cooperative  Airway & Oxygen Therapy: Patient Spontanous Breathing and Patient connected to nasal cannula oxygen  Post-op Assessment: Report given to PACU RN and Post -op Vital signs reviewed and stable  Post vital signs: Reviewed and stable  Complications: No apparent anesthesia complications

## 2014-02-27 NOTE — Op Note (Signed)
Problems: Epigastric abdominal pain. Nonbloody diarrhea  Endoscopist: Danise EdgeMartin Betty Brooks  Premedication: Propofol administered by anesthesia  Procedure: Diagnostic esophagogastroduodenoscopy with small bowel biopsy The patient was placed in the left lateral decubitus position. The Pentax gastroscope was passed through the posterior hypopharynx into the proximal esophagus without difficulty. The hypopharynx, larynx, and vocal cords appeared normal.  Esophagoscopy: The proximal, mid, and lower segments of the esophageal mucosa appeared. The squamocolumnar junction is regular in appearance and noted at 40 cm from the incisor teeth. There was no endoscopic evidence for the presence of erosive esophagitis, Barrett's esophagus, or esophageal stricture formation.  Gastroscopy: Retroflex view of the gastric cardia and fundus was normal. The gastric body, antrum, and pylorus appeared normal.  Duodenoscopy: The duodenal bulb and descending duodenum appeared normal. 3 biopsies were taken from the second portion of the duodenum and 2 biopsies were taken from the duodenal bulb to look for villous atrophy  Assessment: Normal esophagogastroduodenoscopy. Small bowel biopsies pending  Procedure: Diagnostic colonoscopy Anal inspection and digital rectal exam were normal. The Pentax pediatric colonoscope was introduced into the rectum and easily advanced to the cecum. A normal-appearing ileocecal valve was intubated and the terminal ileum inspected. Colonic preparation for the exam today was good.  Rectum. Normal. Retroflexed view of the distal rectum normal  Sigmoid colon and descending colon. Normal  Splenic flexure. Normal  Transverse colon. Normal  Hepatic flexure. Normal  Ascending colon. Normal  Cecum and ileocecal valve. Normal  Biopsies: Random biopsies were taken from the right colon and left colon to look for microscopic colitis  Assessment: Normal diagnostic proctocolonoscopy to the cecum  with inspection of the terminal ileum. Random colon biopsies to look for microscopic colitis pending.  Recommendation: Schedule repeat screening colonoscopy in 10 years

## 2014-02-27 NOTE — Progress Notes (Signed)
Pt accidentally discharged in the computer via Ambulance, but the pt was actually discharged via wheelchair/car with family present/brs, rn

## 2014-02-27 NOTE — Anesthesia Postprocedure Evaluation (Signed)
  Anesthesia Post-op Note  Patient: [name]  Procedure(s) Performed: Procedure(s) (LRB): COLONOSCOPY WITH PROPOFOL (N/A) ESOPHAGOGASTRODUODENOSCOPY (EGD) WITH PROPOFOL (N/A)  Patient Location: PACU  Anesthesia Type: MAC  Level of Consciousness: awake and alert   Airway and Oxygen Therapy: Patient Spontanous Breathing  Post-op Pain: mild  Post-op Assessment: Post-op Vital signs reviewed, Patient's Cardiovascular Status Stable, Respiratory Function Stable, Patent Airway and No signs of Nausea or vomiting  Last Vitals:  Filed Vitals:   02/27/14 1216  BP: 152/95  Pulse: 62  Temp:   Resp: 23    Post-op Vital Signs: stable   Complications: No apparent anesthesia complications

## 2014-02-27 NOTE — Anesthesia Preprocedure Evaluation (Signed)
Anesthesia Evaluation  Patient identified by MRN, date of birth, ID band Patient awake    Reviewed: Allergy & Precautions, H&P , NPO status , Patient's Chart, lab work & pertinent test results  Airway Mallampati: II TM Distance: >3 FB Neck ROM: Full    Dental no notable dental hx.    Pulmonary asthma , sleep apnea ,  breath sounds clear to auscultation  Pulmonary exam normal       Cardiovascular hypertension, Pt. on medications Rhythm:Regular Rate:Normal     Neuro/Psych negative neurological ROS  negative psych ROS   GI/Hepatic negative GI ROS, Neg liver ROS,   Endo/Other  Morbid obesity  Renal/GU negative Renal ROS  negative genitourinary   Musculoskeletal negative musculoskeletal ROS (+)   Abdominal   Peds negative pediatric ROS (+)  Hematology negative hematology ROS (+)   Anesthesia Other Findings   Reproductive/Obstetrics negative OB ROS                           Anesthesia Physical Anesthesia Plan  ASA: III  Anesthesia Plan: MAC   Post-op Pain Management:    Induction:   Airway Management Planned: Natural Airway  Additional Equipment:   Intra-op Plan:   Post-operative Plan:   Informed Consent: I have reviewed the patients History and Physical, chart, labs and discussed the procedure including the risks, benefits and alternatives for the proposed anesthesia with the patient or authorized representative who has indicated his/her understanding and acceptance.   Dental advisory given  Plan Discussed with: CRNA  Anesthesia Plan Comments:         Anesthesia Quick Evaluation

## 2014-02-27 NOTE — H&P (Signed)
  Problems: Epigastric abdominal pain and diarrhea  History: The patient is a 48 year old female born 11/17/1965. She has undergone a cholecystectomy for gallstones and hysterectomy in the past.  The patient has developed chronic aching discomfort in the upper mid abdomen associated with postprandial bowel urgency and the passage of loose bowel movements. She has been experiencing anorexia without weight loss, nausea, or vomiting. She does not take nonsteroidal anti-inflammatory medication. She denies gastrointestinal bleeding.  The patient is scheduled to undergo diagnostic esophagogastroduodenoscopy and colonoscopy  Past medical history: Hypertension. Gastroesophageal reflux. Asthma. Osteoarthritis. Cholecystectomy. Hysterectomy.  Medication allergies: None  Exam: The patient is alert and lying comfortably on the endoscopy stretcher. Abdomen is soft and nontender to palpation. Lungs are clear to auscultation. Cardiac exam reveals a regular rhythm.  Plan: Proceed with diagnostic esophagogastroduodenoscopy and colonoscopy

## 2014-02-28 ENCOUNTER — Encounter (HOSPITAL_COMMUNITY): Payer: Self-pay | Admitting: Gastroenterology

## 2014-02-28 ENCOUNTER — Encounter (HOSPITAL_COMMUNITY): Payer: Self-pay | Admitting: Emergency Medicine

## 2014-02-28 ENCOUNTER — Ambulatory Visit (INDEPENDENT_AMBULATORY_CARE_PROVIDER_SITE_OTHER): Payer: Medicaid Other | Admitting: Obstetrics & Gynecology

## 2014-02-28 ENCOUNTER — Emergency Department (HOSPITAL_COMMUNITY)
Admission: EM | Admit: 2014-02-28 | Discharge: 2014-02-28 | Disposition: A | Discharge status: Home / Self Care | Payer: Medicaid Other | Attending: Emergency Medicine | Admitting: Emergency Medicine

## 2014-02-28 ENCOUNTER — Emergency Department (HOSPITAL_COMMUNITY): Payer: Medicaid Other

## 2014-02-28 VITALS — BP 124/89 | HR 70 | Temp 98.6°F | Ht 64.0 in | Wt 211.3 lb

## 2014-02-28 DIAGNOSIS — IMO0002 Reserved for concepts with insufficient information to code with codable children: Secondary | ICD-10-CM

## 2014-02-28 DIAGNOSIS — Z8669 Personal history of other diseases of the nervous system and sense organs: Secondary | ICD-10-CM | POA: Insufficient documentation

## 2014-02-28 DIAGNOSIS — Z1231 Encounter for screening mammogram for malignant neoplasm of breast: Secondary | ICD-10-CM

## 2014-02-28 DIAGNOSIS — Z9889 Other specified postprocedural states: Secondary | ICD-10-CM | POA: Insufficient documentation

## 2014-02-28 DIAGNOSIS — Z8739 Personal history of other diseases of the musculoskeletal system and connective tissue: Secondary | ICD-10-CM | POA: Insufficient documentation

## 2014-02-28 DIAGNOSIS — N9489 Other specified conditions associated with female genital organs and menstrual cycle: Secondary | ICD-10-CM

## 2014-02-28 DIAGNOSIS — K219 Gastro-esophageal reflux disease without esophagitis: Secondary | ICD-10-CM | POA: Insufficient documentation

## 2014-02-28 DIAGNOSIS — Z79899 Other long term (current) drug therapy: Secondary | ICD-10-CM | POA: Insufficient documentation

## 2014-02-28 DIAGNOSIS — I10 Essential (primary) hypertension: Secondary | ICD-10-CM | POA: Insufficient documentation

## 2014-02-28 DIAGNOSIS — R1013 Epigastric pain: Secondary | ICD-10-CM

## 2014-02-28 DIAGNOSIS — J45909 Unspecified asthma, uncomplicated: Secondary | ICD-10-CM | POA: Insufficient documentation

## 2014-02-28 DIAGNOSIS — K91840 Postprocedural hemorrhage and hematoma of a digestive system organ or structure following a digestive system procedure: Secondary | ICD-10-CM

## 2014-02-28 DIAGNOSIS — N390 Urinary tract infection, site not specified: Secondary | ICD-10-CM

## 2014-02-28 DIAGNOSIS — Y838 Other surgical procedures as the cause of abnormal reaction of the patient, or of later complication, without mention of misadventure at the time of the procedure: Secondary | ICD-10-CM | POA: Insufficient documentation

## 2014-02-28 LAB — URINE MICROSCOPIC-ADD ON

## 2014-02-28 LAB — CBC WITH DIFFERENTIAL/PLATELET
Basophils Absolute: 0 10*3/uL (ref 0.0–0.1)
Basophils Relative: 0 % (ref 0–1)
Eosinophils Absolute: 0 10*3/uL (ref 0.0–0.7)
Eosinophils Relative: 1 % (ref 0–5)
HCT: 37.5 % (ref 36.0–46.0)
Hemoglobin: 12.2 g/dL (ref 12.0–15.0)
Lymphocytes Relative: 26 % (ref 12–46)
Lymphs Abs: 1.5 10*3/uL (ref 0.7–4.0)
MCH: 26.6 pg (ref 26.0–34.0)
MCHC: 32.5 g/dL (ref 30.0–36.0)
MCV: 81.7 fL (ref 78.0–100.0)
Monocytes Absolute: 0.3 10*3/uL (ref 0.1–1.0)
Monocytes Relative: 5 % (ref 3–12)
Neutro Abs: 3.8 10*3/uL (ref 1.7–7.7)
Neutrophils Relative %: 68 % (ref 43–77)
Platelets: 296 10*3/uL (ref 150–400)
RBC: 4.59 MIL/uL (ref 3.87–5.11)
RDW: 13.7 % (ref 11.5–15.5)
WBC: 5.5 10*3/uL (ref 4.0–10.5)

## 2014-02-28 LAB — COMPREHENSIVE METABOLIC PANEL
ALT: 26 U/L (ref 0–35)
AST: 25 U/L (ref 0–37)
Albumin: 3.7 g/dL (ref 3.5–5.2)
Alkaline Phosphatase: 77 U/L (ref 39–117)
BUN: 7 mg/dL (ref 6–23)
CO2: 27 mEq/L (ref 19–32)
Calcium: 9.6 mg/dL (ref 8.4–10.5)
Chloride: 106 mEq/L (ref 96–112)
Creatinine, Ser: 0.57 mg/dL (ref 0.50–1.10)
GFR calc Af Amer: >90 mL/min (ref >90)
GFR calc non Af Amer: >90 mL/min (ref >90)
Glucose, Bld: 97 mg/dL (ref 70–99)
Potassium: 3.1 mEq/L — ABNORMAL LOW (ref 3.7–5.3)
Sodium: 145 mEq/L (ref 137–147)
Total Bilirubin: 0.8 mg/dL (ref 0.3–1.2)
Total Protein: 7 g/dL (ref 6.0–8.3)

## 2014-02-28 LAB — URINALYSIS, ROUTINE W REFLEX MICROSCOPIC
Bilirubin Urine: NEGATIVE
Glucose, UA: NEGATIVE mg/dL
Ketones, ur: NEGATIVE mg/dL
Nitrite: POSITIVE — AB
Protein, ur: NEGATIVE mg/dL
Specific Gravity, Urine: 1.013 (ref 1.005–1.030)
Urobilinogen, UA: 1 mg/dL (ref 0.0–1.0)
pH: 6 (ref 5.0–8.0)

## 2014-02-28 LAB — POC OCCULT BLOOD, ED: Fecal Occult Bld: NEGATIVE

## 2014-02-28 MED ORDER — ONDANSETRON HCL 4 MG/2ML IJ SOLN
4.0000 mg | Freq: Once | INTRAMUSCULAR | Status: AC
  Administered 2014-02-28: 4 mg via INTRAVENOUS
  Filled 2014-02-28: qty 2

## 2014-02-28 MED ORDER — CEPHALEXIN 500 MG PO CAPS
500.0000 mg | ORAL_CAPSULE | Freq: Four times a day (QID) | ORAL | Status: DC
Start: 2014-02-28 — End: 2014-07-22

## 2014-02-28 MED ORDER — MORPHINE SULFATE 4 MG/ML IJ SOLN
4.0000 mg | Freq: Once | INTRAMUSCULAR | Status: AC
  Administered 2014-02-28: 4 mg via INTRAVENOUS
  Filled 2014-02-28: qty 1

## 2014-02-28 NOTE — ED Notes (Signed)
Pt alert, arrives from home, c/o rectal bleeding, onset was this am, pt admit she had a colonoscopy yesterday, resp even unlabored, skin pwd

## 2014-02-28 NOTE — ED Provider Notes (Signed)
CSN: [id number]     Arrival date & time 02/28/14  1130 History   First MD Initiated Contact with Patient 02/28/14 1141     Chief Complaint  Patient presents with  . Rectal Bleeding     (Consider location/radiation/quality/duration/timing/severity/associated sxs/prior Treatment) HPI  This is a 48 year old female history of hypertension who presents with blood per rectum. Patient states that she had a colonoscopy done yesterday. She does not think there was any intervention during the colonoscopy. She states that she got home, she noted blood on her sheets last night. She's also noted blood in her underwear and on her close. She reports crampy mid epigastric abdominal pain. Currently her pain is 7/10. Nothing makes it better or worse. She denies any vomiting. Patient reports dizziness and increasing feelings of feeling off balance. Colonoscopy done by Dr. Laural Benes, [facility].  Past Medical History  Diagnosis Date  . Hypertension   . Asthma   . Gastric reflux   . Syncope and collapse yrs ago  . Osteoarthritis   . Sleep apnea     cpap machine has not used since 2014 due to machine/tubing in bad shape   . History of blood transfusion yrs agio, none recent   Past Surgical History  Procedure Laterality Date  . Cholecystectomy    . Vaginal hysterectomy    . Cardiac catheterization      more than 5 years ago  . Cardiac catheterization  06-07-2012    [facility]: Normal coronary arteries. No significant LVOT/aortic valve gradient  . Colonoscopy with propofol N/A 02/27/2014    Procedure: COLONOSCOPY WITH PROPOFOL;  Surgeon: Charolett Bumpers, MD;  Location: [facility];  Service: Endoscopy;  Laterality: N/A;  . Esophagogastroduodenoscopy (egd) with propofol N/A 02/27/2014    Procedure: ESOPHAGOGASTRODUODENOSCOPY (EGD) WITH PROPOFOL;  Surgeon: Charolett Bumpers, MD;  Location: [facility];  Service: Endoscopy;  Laterality: N/A;   No family history on file. History  Substance Use Topics  . Smoking  status: Never Smoker   . Smokeless tobacco: Never Used  . Alcohol Use: No   OB History   Grav Para Term Preterm Abortions TAB SAB Ect Mult Living   2 2 2       2      Review of Systems  Constitutional: Negative for fever.  Respiratory: Negative for chest tightness and shortness of breath.   Cardiovascular: Negative for chest pain.  Gastrointestinal: Positive for abdominal pain and blood in stool. Negative for nausea, vomiting, diarrhea and rectal pain.  Genitourinary: Negative for dysuria.  Musculoskeletal: Negative for back pain.  Neurological: Negative for headaches.  All other systems reviewed and are negative.     Allergies  No known allergies  Home Medications   Prior to Admission medications   Medication Sig Start Date End Date Taking? Authorizing Provider  albuterol (PROVENTIL HFA;VENTOLIN HFA) 108 (90 BASE) MCG/ACT inhaler Inhale 2 puffs into the lungs 2 (two) times daily.   Yes Historical Provider, MD  atenolol (TENORMIN) 25 MG tablet Take 25 mg by mouth every morning.    Yes Historical Provider, MD  budesonide-formoterol (SYMBICORT) 160-4.5 MCG/ACT inhaler Inhale 2 puffs into the lungs 2 (two) times daily.   Yes Historical Provider, MD  EPINEPHrine (ADRENALIN) 0.1 % nasal solution Place 1 drop into the nose daily.    Yes Historical Provider, MD  esomeprazole (NEXIUM) 40 MG capsule Take 40 mg by mouth daily at 12 noon.   Yes Historical Provider, MD   BP 128/76  Pulse 69  Temp(Src) 98.7 F (37.1 C) (Oral)  Resp 18  SpO2 100% Physical Exam  Nursing note and vitals reviewed. Constitutional: She is oriented to person, place, and time. She appears well-developed and well-nourished.  HENT:  Head: Normocephalic and atraumatic.  Cardiovascular: Normal rate, regular rhythm and normal heart sounds.   Pulmonary/Chest: Effort normal and breath sounds normal. No respiratory distress. She has no wheezes.  Abdominal: Soft. Bowel sounds are normal. There is no tenderness.  There is no rebound and no guarding.  Musculoskeletal: She exhibits no edema.  Neurological: She is alert and oriented to person, place, and time.  Skin: Skin is warm and dry.  Psychiatric: She has a normal mood and affect.    ED Course  Procedures (including critical care time) Labs Review Labs Reviewed  COMPREHENSIVE METABOLIC PANEL - Abnormal; Notable for the following:    Potassium 3.1 (*)    All other components within normal limits  URINALYSIS, ROUTINE W REFLEX MICROSCOPIC - Abnormal; Notable for the following:    APPearance CLOUDY (*)    Hgb urine dipstick TRACE (*)    Nitrite POSITIVE (*)    Leukocytes, UA SMALL (*)    All other components within normal limits  URINE MICROSCOPIC-ADD ON - Abnormal; Notable for the following:    Bacteria, UA MANY (*)    All other components within normal limits  URINE CULTURE  CBC WITH DIFFERENTIAL  POC OCCULT BLOOD, ED    Imaging Review Dg Abd Acute W/chest  02/28/2014   CLINICAL DATA:  Rectal bleeding, abdominal pain  EXAM: ACUTE ABDOMEN SERIES (ABDOMEN 2 VIEW & CHEST 1 VIEW)  COMPARISON:  01/16/2014, 04/04/2012.  FINDINGS: Cardiomediastinal silhouette is stable. No acute infiltrate or pleural effusion. No pulmonary edema. Moderate stool noted in cecum and proximal right colon. Moderate gas noted in hepatic flexure of the colon transverse colon and proximal left colon. No free abdominal air.  IMPRESSION: No acute disease within chest. Nonobstructive small bowel gas pattern. No free abdominal air. Moderate stool noted in proximal colon. Moderate gas noted in transverse colon proximal left colon.   Electronically Signed   By: Natasha MeadLiviu  Pop M.D.   On: 02/28/2014 13:40     EKG Interpretation None      MDM   Final diagnoses:  Colonoscopy causing post-procedural bleeding  Urinary tract infection    Patient presents with GI bleeding. She is nontoxic on exam. Vital signs are reassuring. Patient had colonoscopy yesterday. I have reviewed  patient's colonoscopy procedure and she did have a biopsy. Rectal exam without gross blood. Hemoccult negative. Hemoglobin is stable. Urinalysis does show nitrite-positive urine. We'll send for culture and cover for UTI.    Discuss with on call GI for Dr. Laural BenesJohnson. Given the patient's stability and no recurrence of bleeding, we'll discharge him with GI followup. Patient will be treated for UTI.  After history, exam, and medical workup I feel the patient has been appropriately medically screened and is safe for discharge home. Pertinent diagnoses were discussed with the patient. Patient was given return precautions.     Shon Batonourtney F Dorthey Depace, MD 02/28/14 [phone]

## 2014-02-28 NOTE — Progress Notes (Signed)
   CLINIC ENCOUNTER NOTE  History:  48 y.o. [name] here today for evaluation of postoperative pelvic cyst that was incidentally noted on pelvic CT done for epigastric pain.  This cyst has been noted since after her hysterectomy in 2001 for benign indications; and felt to represent postoperative fluid collection on multiple subsequent scans.  During her CT scan, a 3.1 cm pelvic cyst was again noted but was thought to be increased in size.  This is incorrect, when you evaluate earlier scans, it is actually stable in size.  Patient just wants to make sure all is well.  No GYN concerns.  Of note, patient had a colonoscopy and biopsies yesterday. Still feels distended and has some bleeding, all expected side effects from study.  The following portions of the patient's history were reviewed and updated as appropriate: allergies, current medications, past family history, past medical history, past social history, past surgical history and problem list.  Review of Systems:  Pertinent items are noted in HPI.  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 History hypertension, asthma, GERD CONTRAST: 82mL OMNIPAQUE IOHEXOL 300 MG/ML SOLN IV. Dilute oral contrast. COMPARISON: 07/15/2010, 03/30/2009, 10/16/2008  FINDINGS: Lung bases clear. Gallbladder surgically absent. Tiny cyst lateral segment left lobe liver 5 mm diameter image 18 stable. Liver, spleen, pancreas, kidneys, and adrenal glands normal appearance. Probable small splenules adjacent to splenic hilum and pancreatic tail. Uterus surgically absent. Left pelvic cystic structure 16 x 14 mm image 77 unchanged. Low to intermediate attenuation right pelvic structure 3.1 x 2.5 cm  image 69 question related to superior aspect of the right vaginal cuff, increased in size since previous study. Normal appendix with scattered normal size mesenteric lymph nodes. Stomach and bowel loops grossly normal appearance. No mass, adenopathy, free fluid or inflammatory process. No hernia or acute bone lesion. IMPRESSION: Post hysterectomy with stable low-attenuation left pelvic cyst superior to the vaginal cuff. Interval increase in size of a low to intermediate attenuation collection superior to the right lateral aspect of the vaginal cuff, question complicated postoperative cyst versus solid lesion; followup characterization by pelvic sonography recommended. No evidence of mesenteric mass or calcification to suggest carcinoid tumor ; the area of concern on earlier study of 10/16/2008 is no longer identified.    Assessment & Plan:  Likely benign postsurgical cyst; no significant change compared to scan in 2003. Pelvic ultrasound ordered; will follow up results and manage accordingly. Mammogram scheduled Routine preventative health maintenance measures emphasized.  Jaynie Collins, MD, FACOG Attending Obstetrician & Gynecologist Faculty Practice, [facility]

## 2014-02-28 NOTE — ED Notes (Signed)
Pt transported to xray via stretcher with Tech 

## 2014-02-28 NOTE — Discharge Instructions (Signed)
Colonoscopy, Care After Refer to this sheet in the next few weeks. These instructions provide you with information on caring for yourself after your procedure. Your health care provider may also give you more specific instructions. Your treatment has been planned according to current medical practices, but problems sometimes occur. Call your health care provider if you have any problems or questions after your procedure. WHAT TO EXPECT AFTER THE PROCEDURE  After your procedure, it is typical to have the following:  A small amount of blood in your stool.  Moderate amounts of gas and mild abdominal cramping or bloating. HOME CARE INSTRUCTIONS  Do not drive, operate machinery, or sign important documents for 24 hours.  You may shower and resume your regular physical activities, but move at a slower pace for the first 24 hours.  Take frequent rest periods for the first 24 hours.  Walk around or put a warm pack on your abdomen to help reduce abdominal cramping and bloating.  Drink enough fluids to keep your urine clear or pale yellow.  You may resume your normal diet as instructed by your health care provider. Avoid heavy or fried foods that are hard to digest.  Avoid drinking alcohol for 24 hours or as instructed by your health care provider.  Only take over-the-counter or prescription medicines as directed by your health care provider.  If a tissue sample (biopsy) was taken during your procedure:  Do not take aspirin or blood thinners for 7 days, or as instructed by your health care provider.  Do not drink alcohol for 7 days, or as instructed by your health care provider.  Eat soft foods for the first 24 hours. SEEK MEDICAL CARE IF: You have persistent spotting of blood in your stool 2 3 days after the procedure. SEEK IMMEDIATE MEDICAL CARE IF:  You have more than a small spotting of blood in your stool.  You pass large blood clots in your stool.  Your abdomen is swollen  (distended).  You have nausea or vomiting.  You have a fever.  You have increasing abdominal pain that is not relieved with medicine. Document Released: 05/10/2004 Document Revised: 07/17/2013 Document Reviewed: 06/03/2013 [facility] Patient Information 2014 [place], [place]. Urinary Tract Infection Urinary tract infections (UTIs) can develop anywhere along your urinary tract. Your urinary tract is your body's drainage system for removing wastes and extra water. Your urinary tract includes two kidneys, two ureters, a bladder, and a urethra. Your kidneys are a pair of bean-shaped organs. Each kidney is about the size of your fist. They are located below your ribs, one on each side of your spine. CAUSES Infections are caused by microbes, which are microscopic organisms, including fungi, viruses, and bacteria. These organisms are so small that they can only be seen through a microscope. Bacteria are the microbes that most commonly cause UTIs. SYMPTOMS  Symptoms of UTIs may vary by age and gender of the patient and by the location of the infection. Symptoms in young women typically include a frequent and intense urge to urinate and a painful, burning feeling in the bladder or urethra during urination. Older women and men are more likely to be tired, shaky, and weak and have muscle aches and abdominal pain. A fever may mean the infection is in your kidneys. Other symptoms of a kidney infection include pain in your back or sides below the ribs, nausea, and vomiting. DIAGNOSIS To diagnose a UTI, your caregiver will ask you about your symptoms. Your caregiver also will ask  to provide a urine sample. The urine sample will be tested for bacteria and white blood cells. White blood cells are made by your body to help fight infection. TREATMENT  Typically, UTIs can be treated with medication. Because most UTIs are caused by a bacterial infection, they usually can be treated with the use of antibiotics. The choice  of antibiotic and length of treatment depend on your symptoms and the type of bacteria causing your infection. HOME CARE INSTRUCTIONS  If you were prescribed antibiotics, take them exactly as your caregiver instructs you. Finish the medication even if you feel better after you have only taken some of the medication.  Drink enough water and fluids to keep your urine clear or pale yellow.  Avoid caffeine, tea, and carbonated beverages. They tend to irritate your bladder.  Empty your bladder often. Avoid holding urine for long periods of time.  Empty your bladder before and after sexual intercourse.  After a bowel movement, women should cleanse from front to back. Use each tissue only once. SEEK MEDICAL CARE IF:   You have back pain.  You develop a fever.  Your symptoms do not begin to resolve within 3 days. SEEK IMMEDIATE MEDICAL CARE IF:   You have severe back pain or lower abdominal pain.  You develop chills.  You have nausea or vomiting.  You have continued burning or discomfort with urination. MAKE SURE YOU:   Understand these instructions.  Will watch your condition.  Will get help right away if you are not doing well or get worse. Document Released: 07/06/2005 Document Revised: 03/27/2012 Document Reviewed: 11/04/2011 [facility] Patient Information 2014 [place], [place].

## 2014-02-28 NOTE — ED Notes (Signed)
Pt states she has rectal bleeding that started last night, states its not just when she uses the restroom. Pt states that it is bright red. Pt also c/o abd pain.

## 2014-02-28 NOTE — ED Notes (Signed)
Pt returns to room 

## 2014-03-03 LAB — URINE CULTURE: Colony Count: >=100000

## 2014-03-04 ENCOUNTER — Telehealth (HOSPITAL_BASED_OUTPATIENT_CLINIC_OR_DEPARTMENT_OTHER): Payer: Self-pay | Admitting: Emergency Medicine

## 2014-03-04 NOTE — Telephone Encounter (Signed)
Post ED Visit - Positive Culture Follow-up  Culture report reviewed by antimicrobial stewardship pharmacist: []  [name], Pharm.D., BCPS []  [name], Pharm.D., BCPS []  [name], Pharm.D., BCPS []  [name], [place].D., BCPS, AAHIVP []  [name], Pharm.D., BCPS, AAHIVP []  [name], Pharm.D. [x]  [name], Pharm.D.  Positive urine culture Treated with Keflex, organism sensitive to the same and no further patient follow-up is required at this time.  [name] 03/04/2014, 1:08 PM

## 2014-03-10 ENCOUNTER — Telehealth: Payer: Self-pay | Admitting: *Deleted

## 2014-03-10 NOTE — Telephone Encounter (Signed)
Enrique Sack calling to notify us need precert authorization  for pelvic vaginal and complete ultrasounds ordered for 6'/4/15

## 2014-03-11 NOTE — Telephone Encounter (Signed)
Precert completed and authorization given to [facility].

## 2014-03-13 ENCOUNTER — Ambulatory Visit (HOSPITAL_COMMUNITY)
Admission: RE | Admit: 2014-03-13 | Discharge: 2014-03-13 | Disposition: A | Discharge status: Home / Self Care | Payer: Medicaid Other | Source: Ambulatory Visit | Attending: Obstetrics & Gynecology | Admitting: Obstetrics & Gynecology

## 2014-03-13 DIAGNOSIS — IMO0002 Reserved for concepts with insufficient information to code with codable children: Secondary | ICD-10-CM

## 2014-03-13 DIAGNOSIS — Z1231 Encounter for screening mammogram for malignant neoplasm of breast: Secondary | ICD-10-CM | POA: Diagnosis not present

## 2014-03-13 DIAGNOSIS — N898 Other specified noninflammatory disorders of vagina: Secondary | ICD-10-CM | POA: Insufficient documentation

## 2014-03-13 DIAGNOSIS — Z90710 Acquired absence of both cervix and uterus: Secondary | ICD-10-CM | POA: Diagnosis not present

## 2014-03-14 ENCOUNTER — Encounter: Payer: Self-pay | Admitting: Obstetrics & Gynecology

## 2014-03-17 ENCOUNTER — Ambulatory Visit: Payer: Medicaid Other | Admitting: Internal Medicine

## 2014-03-18 ENCOUNTER — Telehealth: Payer: Self-pay

## 2014-03-18 NOTE — Telephone Encounter (Signed)
Message copied by Louanna Raw on Tue Mar 18, 2014 10:47 AM ------      Message from: Jaynie Collins A      Created: Fri Mar 14, 2014  9:10 AM       Normal small benign cysts corresponding to her ovaries being in place. No further imaging is needed. No surgical intervention is needed. Please call to inform patient of results and recommendations.       ------

## 2014-03-18 NOTE — Telephone Encounter (Signed)
[name] called back stating someone called her about results of ultrasound and mammogram. Requests a call asap. Called [name] and notified her per Dr. Macon Large ultrasound showed benign cysts- no further tests or surgery needed. Also notified her that mammogram was normal .  [name] voices understanding.

## 2014-03-18 NOTE — Telephone Encounter (Signed)
Attempted to call patient. No answer. Left message stating we are calling with results, please call clinic.  

## 2014-05-15 ENCOUNTER — Encounter: Payer: Self-pay | Admitting: Internal Medicine

## 2014-05-15 ENCOUNTER — Ambulatory Visit: Payer: Medicaid Other | Attending: Internal Medicine | Admitting: Internal Medicine

## 2014-05-15 VITALS — BP 109/69 | HR 78 | Temp 98.3°F | Resp 16 | Wt 210.8 lb

## 2014-05-15 DIAGNOSIS — I10 Essential (primary) hypertension: Secondary | ICD-10-CM | POA: Diagnosis not present

## 2014-05-15 DIAGNOSIS — M1990 Unspecified osteoarthritis, unspecified site: Secondary | ICD-10-CM | POA: Insufficient documentation

## 2014-05-15 DIAGNOSIS — J329 Chronic sinusitis, unspecified: Secondary | ICD-10-CM

## 2014-05-15 DIAGNOSIS — K219 Gastro-esophageal reflux disease without esophagitis: Secondary | ICD-10-CM | POA: Diagnosis not present

## 2014-05-15 DIAGNOSIS — R42 Dizziness and giddiness: Secondary | ICD-10-CM | POA: Insufficient documentation

## 2014-05-15 DIAGNOSIS — J45909 Unspecified asthma, uncomplicated: Secondary | ICD-10-CM | POA: Insufficient documentation

## 2014-05-15 DIAGNOSIS — H6690 Otitis media, unspecified, unspecified ear: Secondary | ICD-10-CM

## 2014-05-15 DIAGNOSIS — H6692 Otitis media, unspecified, left ear: Secondary | ICD-10-CM

## 2014-05-15 MED ORDER — AZITHROMYCIN 250 MG PO TABS: ORAL_TABLET | ORAL | Status: DC

## 2014-05-15 MED ORDER — MECLIZINE HCL 25 MG PO TABS: 25.0000 mg | ORAL_TABLET | Freq: Three times a day (TID) | ORAL | Status: DC | PRN

## 2014-05-15 NOTE — Progress Notes (Signed)
Patient complains of light headed Some minor dizziness and headache Bilateral ear pain with yellow discharge for the Past two weeks

## 2014-05-15 NOTE — Progress Notes (Signed)
MRN: [id number] Name: [name]  Sex: female Age: 48 y.o. DOB: 08/04/1966  Allergies: No known allergies  Chief Complaint  Patient presents with  . Dizziness    HPI: Patient is 48 y.o. female who comes today reported to have stuffy nose nasal congestion sinus trouble headache, sore throat, dizziness, questionable she noticed some discharge from the ear, complaining of chills denies any fever denies any chest pain or shortness of breath denies smoking cigarettes.  Past Medical History  Diagnosis Date  . Hypertension   . Asthma   . Gastric reflux   . Syncope and collapse yrs ago  . Osteoarthritis   . Sleep apnea     cpap machine has not used since 2014 due to machine/tubing in bad shape   . History of blood transfusion yrs agio, none recent    Past Surgical History  Procedure Laterality Date  . Cholecystectomy    . Vaginal hysterectomy  2001    Adnexa left in place  . Cardiac catheterization      more than 5 years ago  . Cardiac catheterization  06-07-2012    [facility]: Normal coronary arteries. No significant LVOT/aortic valve gradient  . Colonoscopy with propofol N/A 02/27/2014    Procedure: COLONOSCOPY WITH PROPOFOL;  Surgeon: Charolett BumpersMartin K Johnson, MD;  Location: [facility];  Service: Endoscopy;  Laterality: N/A;  . Esophagogastroduodenoscopy (egd) with propofol N/A 02/27/2014    Procedure: ESOPHAGOGASTRODUODENOSCOPY (EGD) WITH PROPOFOL;  Surgeon: Charolett BumpersMartin K Johnson, MD;  Location: [facility];  Service: Endoscopy;  Laterality: N/A;  . Exploratory laparotomy  09/2001    ELAP, LOA, R partial oophorectomy, repair of bowel injury      Medication List       This list is accurate as of: 05/15/14 11:46 AM.  Always use your most recent med list.               albuterol 108 (90 BASE) MCG/ACT inhaler  Commonly known as:  PROVENTIL HFA;VENTOLIN HFA  Inhale 2 puffs into the lungs 2 (two) times daily.     atenolol 25 MG tablet  Commonly known as:  TENORMIN  Take 25 mg by  mouth every morning.     azithromycin 250 MG tablet  Commonly known as:  ZITHROMAX Z-PAK  Take as directed     budesonide-formoterol 160-4.5 MCG/ACT inhaler  Commonly known as:  SYMBICORT  Inhale 2 puffs into the lungs 2 (two) times daily.     cephALEXin 500 MG capsule  Commonly known as:  KEFLEX  Take 1 capsule (500 mg total) by mouth 4 (four) times daily.     EPINEPHrine 0.1 % nasal solution  Commonly known as:  ADRENALIN  Place 1 drop into the nose daily.     esomeprazole 40 MG capsule  Commonly known as:  NEXIUM  Take 40 mg by mouth daily at 12 noon.     meclizine 25 MG tablet  Commonly known as:  ANTIVERT  Take 1 tablet (25 mg total) by mouth 3 (three) times daily as needed for nausea.        Meds ordered this encounter  Medications  . meclizine (ANTIVERT) 25 MG tablet    Sig: Take 1 tablet (25 mg total) by mouth 3 (three) times daily as needed for nausea.    Dispense:  30 tablet    Refill:  1  . azithromycin (ZITHROMAX Z-PAK) 250 MG tablet    Sig: Take as directed    Dispense:  6 each  Refill:  0     There is no immunization history on file for this patient.  History reviewed. No pertinent family history.  History  Substance Use Topics  . Smoking status: Never Smoker   . Smokeless tobacco: Never Used  . Alcohol Use: No    Review of Systems   As noted in HPI  Filed Vitals:   05/15/14 1115  BP: 109/69  Pulse: 78  Temp: 98.3 F (36.8 C)  Resp: 16    Physical Exam  Physical Exam  Constitutional: No distress.  HENT:  Bilateral minimal ear wax, left TM congested, nasal congestion sinus tenderness  Eyes: EOM are normal. Pupils are equal, round, and reactive to light.  No nystagmus   Cardiovascular: Normal rate and regular rhythm.   Pulmonary/Chest: Breath sounds normal. No respiratory distress. She has no wheezes. She has no rales.  Musculoskeletal: She exhibits no edema.    CBC    Component Value Date/Time   WBC 5.5 02/28/2014 1146     WBC 4.5 06/04/2012 1136   RBC 4.59 02/28/2014 1146   RBC 4.49 06/04/2012 1136   HGB 12.2 02/28/2014 1146   HCT 37.5 02/28/2014 1146   PLT 296 02/28/2014 1146   MCV 81.7 02/28/2014 1146   LYMPHSABS 1.5 02/28/2014 1146   LYMPHSABS 1.1 06/04/2012 1136   MONOABS 0.3 02/28/2014 1146   EOSABS 0.0 02/28/2014 1146   EOSABS 0.0 06/04/2012 1136   BASOSABS 0.0 02/28/2014 1146   BASOSABS 0.0 06/04/2012 1136    CMP     Component Value Date/Time   NA 145 02/28/2014 1146   NA 142 06/04/2012 1136   K 3.1* 02/28/2014 1146   CL 106 02/28/2014 1146   CO2 27 02/28/2014 1146   GLUCOSE 97 02/28/2014 1146   GLUCOSE 80 06/04/2012 1136   BUN 7 02/28/2014 1146   BUN 10 06/04/2012 1136   CREATININE 0.57 02/28/2014 1146   CREATININE 0.55 01/14/2014 1148   CALCIUM 9.6 02/28/2014 1146   PROT 7.0 02/28/2014 1146   ALBUMIN 3.7 02/28/2014 1146   AST 25 02/28/2014 1146   ALT 26 02/28/2014 1146   ALKPHOS 77 02/28/2014 1146   BILITOT 0.8 02/28/2014 1146   GFRNONAA >90 02/28/2014 1146   GFRNONAA >89 01/14/2014 1148   GFRAA >90 02/28/2014 1146   GFRAA >89 01/14/2014 1148    Lab Results  Component Value Date/Time   CHOL 161 12/10/2013 10:32 AM    No components found with this basename: hga1c    Lab Results  Component Value Date/Time   AST 25 02/28/2014 11:46 AM    Assessment and Plan  Unspecified sinusitis (chronic)/Otitis, left  - Plan: azithromycin (ZITHROMAX Z-PAK) 250 MG tablet   Dizziness and giddiness - Plan: I have prescribed meclizine (ANTIVERT) 25 MG tablet when necessary for dizziness.   Return if symptoms worsen or fail to improve.  Doris Cheadle, MD

## 2014-06-20 ENCOUNTER — Telehealth: Payer: Self-pay | Admitting: Internal Medicine

## 2014-06-20 NOTE — Telephone Encounter (Signed)
Pt. Called stating that she received a phone call from our facility last week stating that she needed a breathing treatment, and for her to come in within this month. Pt. Does not have any telephone encounter from [place]. Please f/u with pt.

## 2014-06-25 ENCOUNTER — Telehealth: Payer: Self-pay | Admitting: Emergency Medicine

## 2014-06-25 NOTE — Telephone Encounter (Signed)
Pt states message mis communicated and resolved.

## 2014-07-21 ENCOUNTER — Emergency Department (HOSPITAL_COMMUNITY): Payer: Medicaid Other

## 2014-07-21 ENCOUNTER — Encounter (HOSPITAL_COMMUNITY): Payer: Self-pay | Admitting: Emergency Medicine

## 2014-07-21 ENCOUNTER — Emergency Department (HOSPITAL_COMMUNITY)
Admission: EM | Admit: 2014-07-21 | Discharge: 2014-07-22 | Disposition: A | Discharge status: Home / Self Care | Payer: Medicaid Other | Attending: Emergency Medicine | Admitting: Emergency Medicine

## 2014-07-21 DIAGNOSIS — R0789 Other chest pain: Secondary | ICD-10-CM | POA: Diagnosis not present

## 2014-07-21 DIAGNOSIS — I10 Essential (primary) hypertension: Secondary | ICD-10-CM | POA: Insufficient documentation

## 2014-07-21 DIAGNOSIS — M1990 Unspecified osteoarthritis, unspecified site: Secondary | ICD-10-CM | POA: Diagnosis not present

## 2014-07-21 DIAGNOSIS — Z79899 Other long term (current) drug therapy: Secondary | ICD-10-CM | POA: Diagnosis not present

## 2014-07-21 DIAGNOSIS — J45909 Unspecified asthma, uncomplicated: Secondary | ICD-10-CM | POA: Insufficient documentation

## 2014-07-21 DIAGNOSIS — Z8719 Personal history of other diseases of the digestive system: Secondary | ICD-10-CM | POA: Insufficient documentation

## 2014-07-21 DIAGNOSIS — Z9889 Other specified postprocedural states: Secondary | ICD-10-CM | POA: Insufficient documentation

## 2014-07-21 DIAGNOSIS — R079 Chest pain, unspecified: Secondary | ICD-10-CM | POA: Diagnosis present

## 2014-07-21 LAB — BASIC METABOLIC PANEL
Anion gap: 12 (ref 5–15)
BUN: 13 mg/dL (ref 6–23)
CO2: 27 mEq/L (ref 19–32)
Calcium: 10.1 mg/dL (ref 8.4–10.5)
Chloride: 106 mEq/L (ref 96–112)
Creatinine, Ser: 0.6 mg/dL (ref 0.50–1.10)
GFR calc Af Amer: >90 mL/min (ref >90)
GFR calc non Af Amer: >90 mL/min (ref >90)
Glucose, Bld: 93 mg/dL (ref 70–99)
Potassium: 3.8 mEq/L (ref 3.7–5.3)
Sodium: 145 mEq/L (ref 137–147)

## 2014-07-21 LAB — CBC
HCT: 39.3 % (ref 36.0–46.0)
Hemoglobin: 12.8 g/dL (ref 12.0–15.0)
MCH: 26.7 pg (ref 26.0–34.0)
MCHC: 32.6 g/dL (ref 30.0–36.0)
MCV: 81.9 fL (ref 78.0–100.0)
Platelets: 277 10*3/uL (ref 150–400)
RBC: 4.8 MIL/uL (ref 3.87–5.11)
RDW: 13.3 % (ref 11.5–15.5)
WBC: 6.5 10*3/uL (ref 4.0–10.5)

## 2014-07-21 LAB — I-STAT TROPONIN, ED: Troponin i, poc: 0 ng/mL (ref 0.00–0.08)

## 2014-07-21 NOTE — ED Notes (Signed)
Pt states that she began to have tightness to her chest that began last pm; pt denies radiation of pain; [facility]; pt states that she has a dull headache; pt denies weakness; pt states that the pain is constant

## 2014-07-22 MED ORDER — KETOROLAC TROMETHAMINE 30 MG/ML IJ SOLN
30.0000 mg | Freq: Once | INTRAMUSCULAR | Status: AC
  Administered 2014-07-22: 30 mg via INTRAMUSCULAR
  Filled 2014-07-22: qty 1

## 2014-07-22 MED ORDER — CYCLOBENZAPRINE HCL 5 MG PO TABS: 5.0000 mg | ORAL_TABLET | Freq: Three times a day (TID) | ORAL | Status: DC

## 2014-07-22 MED ORDER — CYCLOBENZAPRINE HCL 10 MG PO TABS
5.0000 mg | ORAL_TABLET | Freq: Once | ORAL | Status: AC
  Administered 2014-07-22: 5 mg via ORAL
  Filled 2014-07-22: qty 1

## 2014-07-22 MED ORDER — NAPROXEN 500 MG PO TABS: 500.0000 mg | ORAL_TABLET | Freq: Two times a day (BID) | ORAL | Status: DC

## 2014-07-22 NOTE — ED Provider Notes (Signed)
CSN: [id number]     Arrival date & time 07/21/14  1938 History   First MD Initiated Contact with Patient 07/22/14 0038     Chief Complaint  Patient presents with  . Chest Pain     (Consider location/radiation/quality/duration/timing/severity/associated sxs/prior Treatment) HPI Comments: Noted to have L chest wall pain since last night.  Denies SOB, tachycardia, trauma, rash, nausea, diaphoresis.  States gradual onset has not taken any medication for dscomfort  Called her PCP and was told to call back on Friday to set an appointment   Patient is a 48 y.o. female presenting with chest pain. The history is provided by the patient.  Chest Pain Pain location:  L chest Pain quality: aching and tightness   Pain radiates to:  Does not radiate Pain radiates to the back: no   Pain severity:  Moderate Onset quality:  Gradual Duration:  24 hours Timing:  Constant Progression:  Unchanged Chronicity:  New Context: lifting, movement, raising an arm and at rest   Context: no trauma   Relieved by:  None tried Worsened by:  Certain positions and exertion (Palpatation ) Ineffective treatments:  None tried Associated symptoms: no anorexia, no anxiety, no cough, no dizziness, no dysphagia, no fatigue, no fever, no nausea, no shortness of breath and not vomiting     Past Medical History  Diagnosis Date  . Hypertension   . Asthma   . Gastric reflux   . Syncope and collapse yrs ago  . Osteoarthritis   . Sleep apnea     cpap machine has not used since 2014 due to machine/tubing in bad shape   . History of blood transfusion yrs agio, none recent   Past Surgical History  Procedure Laterality Date  . Cholecystectomy    . Vaginal hysterectomy  2001    Adnexa left in place  . Cardiac catheterization      more than 5 years ago  . Cardiac catheterization  06-07-2012    [facility]: Normal coronary arteries. No significant LVOT/aortic valve gradient  . Colonoscopy with propofol N/A 02/27/2014   Procedure: COLONOSCOPY WITH PROPOFOL;  Surgeon: Charolett BumpersMartin K Johnson, MD;  Location: [facility];  Service: Endoscopy;  Laterality: N/A;  . Esophagogastroduodenoscopy (egd) with propofol N/A 02/27/2014    Procedure: ESOPHAGOGASTRODUODENOSCOPY (EGD) WITH PROPOFOL;  Surgeon: Charolett BumpersMartin K Johnson, MD;  Location: [facility];  Service: Endoscopy;  Laterality: N/A;  . Exploratory laparotomy  09/2001    ELAP, LOA, R partial oophorectomy, repair of bowel injury   No family history on file. History  Substance Use Topics  . Smoking status: Never Smoker   . Smokeless tobacco: Never Used  . Alcohol Use: No   OB History   Grav Para Term Preterm Abortions TAB SAB Ect Mult Living   2 2 2       2      Review of Systems  Constitutional: Negative for fever and fatigue.  HENT: Negative for trouble swallowing.   Respiratory: Positive for chest tightness. Negative for cough, shortness of breath and wheezing.   Cardiovascular: Positive for chest pain. Negative for leg swelling.  Gastrointestinal: Negative for nausea, vomiting and anorexia.  Skin: Negative for rash.  Neurological: Negative for dizziness.  All other systems reviewed and are negative.     Allergies  No known allergies  Home Medications   Prior to Admission medications   Medication Sig Start Date End Date Taking? Authorizing Provider  albuterol (PROVENTIL HFA;VENTOLIN HFA) 108 (90 BASE) MCG/ACT inhaler Inhale 2 puffs into  the lungs 2 (two) times daily.   Yes Historical Provider, MD  atenolol (TENORMIN) 25 MG tablet Take 25 mg by mouth every morning.    Yes Historical Provider, MD  budesonide-formoterol (SYMBICORT) 160-4.5 MCG/ACT inhaler Inhale 2 puffs into the lungs 2 (two) times daily.   Yes Historical Provider, MD  meclizine (ANTIVERT) 25 MG tablet Take 1 tablet (25 mg total) by mouth 3 (three) times daily as needed for nausea. 05/15/14  Yes Doris Cheadleeepak Advani, MD  cyclobenzaprine (FLEXERIL) 5 MG tablet Take 1 tablet (5 mg total) by mouth 3  (three) times daily. 07/22/14   Arman FilterGail K Mackinzee Roszak, NP  naproxen (NAPROSYN) 500 MG tablet Take 1 tablet (500 mg total) by mouth 2 (two) times daily. 07/22/14   Arman FilterGail K Lessly Stigler, NP   BP 121/69  Pulse 58  Temp(Src) 98.3 F (36.8 C) (Oral)  Resp 16  SpO2 97% Physical Exam  Nursing note and vitals reviewed. Constitutional: She is oriented to person, place, and time. She appears well-developed and well-nourished.  HENT:  Head: Normocephalic.  Mouth/Throat: Oropharynx is clear and moist.  Eyes: Pupils are equal, round, and reactive to light.  Neck: Normal range of motion.  Cardiovascular: Normal rate, regular rhythm and normal heart sounds.   Pulmonary/Chest: Effort normal. She exhibits tenderness.    Abdominal: Soft. Bowel sounds are normal. She exhibits no distension. There is no tenderness.  Musculoskeletal: Normal range of motion.  Neurological: She is alert and oriented to person, place, and time.  Skin: Skin is warm and dry. No rash noted. No erythema.    ED Course  Procedures (including critical care time) Labs Review Labs Reviewed  CBC  BASIC METABOLIC PANEL  I-STAT TROPOININ, ED    Imaging Review Dg Chest 2 View  07/21/2014   CLINICAL DATA:  Mid chest pain which initiated 1 day prior.  EXAM: CHEST  2 VIEW  COMPARISON:  02/28/2014  FINDINGS: Normal mediastinum and cardiac silhouette. Normal pulmonary vasculature. No evidence of effusion, infiltrate, or pneumothorax. No acute bony abnormality. Cholecystectomy clips noted.  IMPRESSION: No acute cardiopulmonary process.   Electronically Signed   By: Genevive BiStewart  Edmunds M.D.   On: 07/21/2014 21:10     EKG Interpretation   Date/Time:  Monday July 21 2014 19:49:15 EDT Ventricular Rate:  65 PR Interval:  179 QRS Duration: 102 QT Interval:  424 QTC Calculation: 441 R Axis:   -20 Text Interpretation:  Sinus rhythm Low voltage, precordial leads Probable  left ventricular hypertrophy No significant change since last tracing    Confirmed by [facility]  MD, Jonny RuizJOHN ([id number]) on 07/21/2014 8:00:10 PM     Labs EKG and chest xray reviewed--doubt cardiac in nature as pain is reproducible will give IM Toradol and reassess Decrease in discomfort states increased discomfort with movement-- will add flexeril and DC home  MDM   Final diagnoses:  Chest wall pain        Arman FilterGail K Jakhari Space, NP 07/22/14 [phone]

## 2014-07-22 NOTE — ED Provider Notes (Signed)
Medical screening examination/treatment/procedure(s) were performed by non-physician practitioner and as supervising physician I was immediately available for consultation/collaboration.   EKG Interpretation   Date/Time:  Monday July 21 2014 19:49:15 EDT Ventricular Rate:  65 PR Interval:  179 QRS Duration: 102 QT Interval:  424 QTC Calculation: 441 R Axis:   -20 Text Interpretation:  Sinus rhythm Low voltage, precordial leads Probable  left ventricular hypertrophy No significant change since last tracing  Confirmed by [facility]  MD, Jonny RuizJOHN ([id number]) on 07/21/2014 8:00:10 PM Also  confirmed by [facility]  MD, Jonny RuizJOHN ([id number]), editor [place]  CCT, BEVERLY  ([id number])  on 07/22/2014 7:53:46 AM        Jenna CrumbleAdeleke Jerney Baksh, MD 07/22/14 1410

## 2014-07-22 NOTE — Discharge Instructions (Signed)
Chest Wall Pain Chest wall pain is pain felt in or around the chest bones and muscles. It may take up to 6 weeks to get better. It may take longer if you are active. Chest wall pain can happen on its own. Other times, things like germs, injury, coughing, or exercise can cause the pain. HOME CARE   Avoid activities that make you tired or cause pain. Try not to use your chest, belly (abdominal), or side muscles. Do not use heavy weights.  Put ice on the sore area.  Put ice in a plastic bag.  Place a towel between your skin and the bag.  Leave the ice on for 15-20 minutes for the first 2 days.  Only take medicine as told by your doctor. GET HELP RIGHT AWAY IF:   You have more pain or are very uncomfortable.  You have a fever.  Your chest pain gets worse.  You have new problems.  You feel sick to your stomach (nauseous) or throw up (vomit).  You start to sweat or feel lightheaded.  You have a cough with mucus (phlegm).  You cough up blood. MAKE SURE YOU:   Understand these instructions.  Will watch your condition.  Will get help right away if you are not doing well or get worse. Document Released: 03/14/2008 Document Revised: 12/19/2011 Document Reviewed: 05/23/2011 [facility] Patient Information 2015 [place], [place]. This information is not intended to replace advice given to you by your health care provider. Make sure you discuss any questions you have with your health care provider. Take the prescribed medication as directed   Make an appointment with your PCP for follow up examinatin

## 2014-08-11 ENCOUNTER — Encounter (HOSPITAL_COMMUNITY): Payer: Self-pay | Admitting: Emergency Medicine

## 2014-09-01 ENCOUNTER — Ambulatory Visit: Payer: Medicaid Other | Attending: Internal Medicine | Admitting: Internal Medicine

## 2014-09-01 ENCOUNTER — Encounter: Payer: Self-pay | Admitting: Internal Medicine

## 2014-09-01 VITALS — BP 115/79 | HR 80 | Temp 98.0°F | Resp 16 | Wt 215.4 lb

## 2014-09-01 DIAGNOSIS — K219 Gastro-esophageal reflux disease without esophagitis: Secondary | ICD-10-CM | POA: Insufficient documentation

## 2014-09-01 DIAGNOSIS — I10 Essential (primary) hypertension: Secondary | ICD-10-CM | POA: Insufficient documentation

## 2014-09-01 DIAGNOSIS — Z79899 Other long term (current) drug therapy: Secondary | ICD-10-CM | POA: Insufficient documentation

## 2014-09-01 DIAGNOSIS — Z791 Long term (current) use of non-steroidal anti-inflammatories (NSAID): Secondary | ICD-10-CM | POA: Diagnosis not present

## 2014-09-01 DIAGNOSIS — G4730 Sleep apnea, unspecified: Secondary | ICD-10-CM | POA: Diagnosis not present

## 2014-09-01 DIAGNOSIS — J45909 Unspecified asthma, uncomplicated: Secondary | ICD-10-CM | POA: Insufficient documentation

## 2014-09-01 DIAGNOSIS — R1013 Epigastric pain: Secondary | ICD-10-CM | POA: Diagnosis present

## 2014-09-01 DIAGNOSIS — Z9049 Acquired absence of other specified parts of digestive tract: Secondary | ICD-10-CM | POA: Insufficient documentation

## 2014-09-01 DIAGNOSIS — Z7951 Long term (current) use of inhaled steroids: Secondary | ICD-10-CM | POA: Insufficient documentation

## 2014-09-01 MED ORDER — OMEPRAZOLE 40 MG PO CPDR: 40.0000 mg | DELAYED_RELEASE_CAPSULE | Freq: Every day | ORAL | Status: DC

## 2014-09-01 NOTE — Progress Notes (Signed)
MRN: [id number] Name: [name]  Sex: female Age: 48 y.o. DOB: 05/24/1966  Allergies: No known allergies  Chief Complaint  Patient presents with  . Abdominal Pain    HPI: Patient is 48 y.o. female who history of chronic epigastric pain, he described it as the pain and occasional reflux symptoms denies any nausea vomiting, change in bowel habits, denies any urinary symptoms denies any vaginal discharge, patient had workup done in the past already had a CT scan done which reported pelvic cyst which as per patient she is already followed with gynecologist, she  Already had endoscopy done which was reported to be normal.patient has not tried any medication for the symptoms.  Past Medical History  Diagnosis Date  . Hypertension   . Asthma   . Gastric reflux   . Syncope and collapse yrs ago  . Osteoarthritis   . Sleep apnea     cpap machine has not used since 2014 due to machine/tubing in bad shape   . History of blood transfusion yrs agio, none recent    Past Surgical History  Procedure Laterality Date  . Cholecystectomy    . Vaginal hysterectomy  2001    Adnexa left in place  . Cardiac catheterization      more than 5 years ago  . Cardiac catheterization  06-07-2012    [facility]: Normal coronary arteries. No significant LVOT/aortic valve gradient  . Colonoscopy with propofol N/A 02/27/2014    Procedure: COLONOSCOPY WITH PROPOFOL;  Surgeon: Charolett BumpersMartin K Johnson, MD;  Location: [facility];  Service: Endoscopy;  Laterality: N/A;  . Esophagogastroduodenoscopy (egd) with propofol N/A 02/27/2014    Procedure: ESOPHAGOGASTRODUODENOSCOPY (EGD) WITH PROPOFOL;  Surgeon: Charolett BumpersMartin K Johnson, MD;  Location: [facility];  Service: Endoscopy;  Laterality: N/A;  . Exploratory laparotomy  09/2001    ELAP, LOA, R partial oophorectomy, repair of bowel injury      Medication List       This list is accurate as of: 09/01/14 12:27 PM.  Always use your most recent med list.               albuterol 108 (90 BASE) MCG/ACT inhaler  Commonly known as:  PROVENTIL HFA;VENTOLIN HFA  Inhale 2 puffs into the lungs 2 (two) times daily.     atenolol 25 MG tablet  Commonly known as:  TENORMIN  Take 25 mg by mouth every morning.     budesonide-formoterol 160-4.5 MCG/ACT inhaler  Commonly known as:  SYMBICORT  Inhale 2 puffs into the lungs 2 (two) times daily.     cyclobenzaprine 5 MG tablet  Commonly known as:  FLEXERIL  Take 1 tablet (5 mg total) by mouth 3 (three) times daily.     meclizine 25 MG tablet  Commonly known as:  ANTIVERT  Take 1 tablet (25 mg total) by mouth 3 (three) times daily as needed for nausea.     naproxen 500 MG tablet  Commonly known as:  NAPROSYN  Take 1 tablet (500 mg total) by mouth 2 (two) times daily.     omeprazole 40 MG capsule  Commonly known as:  PRILOSEC  Take 1 capsule (40 mg total) by mouth daily.        Meds ordered this encounter  Medications  . omeprazole (PRILOSEC) 40 MG capsule    Sig: Take 1 capsule (40 mg total) by mouth daily.    Dispense:  30 capsule    Refill:  2     There is no  immunization history on file for this patient.  History reviewed. No pertinent family history.  History  Substance Use Topics  . Smoking status: Never Smoker   . Smokeless tobacco: Never Used  . Alcohol Use: No    Review of Systems   As noted in HPI  Filed Vitals:   09/01/14 1152  BP: 115/79  Pulse: 80  Temp: 98 F (36.7 C)  Resp: 16    Physical Exam  Physical Exam  Constitutional: No distress.  Eyes: EOM are normal. Pupils are equal, round, and reactive to light.  Cardiovascular: Normal rate and regular rhythm.   Pulmonary/Chest: Breath sounds normal. No respiratory distress. She has no wheezes. She has no rales.  Abdominal: Bowel sounds are normal. She exhibits no distension. There is no tenderness. There is no rebound.    CBC    Component Value Date/Time   WBC 6.5 07/21/2014 2012   WBC 4.5 06/04/2012 1136   RBC  4.80 07/21/2014 2012   RBC 4.49 06/04/2012 1136   HGB 12.8 07/21/2014 2012   HCT 39.3 07/21/2014 2012   PLT 277 07/21/2014 2012   MCV 81.9 07/21/2014 2012   LYMPHSABS 1.5 02/28/2014 1146   LYMPHSABS 1.1 06/04/2012 1136   MONOABS 0.3 02/28/2014 1146   EOSABS 0.0 02/28/2014 1146   EOSABS 0.0 06/04/2012 1136   BASOSABS 0.0 02/28/2014 1146   BASOSABS 0.0 06/04/2012 1136    CMP     Component Value Date/Time   NA 145 07/21/2014 2012   NA 142 06/04/2012 1136   K 3.8 07/21/2014 2012   CL 106 07/21/2014 2012   CO2 27 07/21/2014 2012   GLUCOSE 93 07/21/2014 2012   GLUCOSE 80 06/04/2012 1136   BUN 13 07/21/2014 2012   BUN 10 06/04/2012 1136   CREATININE 0.60 07/21/2014 2012   CREATININE 0.55 01/14/2014 1148   CALCIUM 10.1 07/21/2014 2012   PROT 7.0 02/28/2014 1146   ALBUMIN 3.7 02/28/2014 1146   AST 25 02/28/2014 1146   ALT 26 02/28/2014 1146   ALKPHOS 77 02/28/2014 1146   BILITOT 0.8 02/28/2014 1146   GFRNONAA >90 07/21/2014 2012   GFRNONAA >89 01/14/2014 1148   GFRAA >90 07/21/2014 2012   GFRAA >89 01/14/2014 1148    Lab Results  Component Value Date/Time   CHOL 161 12/10/2013 10:32 AM    No components found for: HGA1C  Lab Results  Component Value Date/Time   AST 25 02/28/2014 11:46 AM    Assessment and Plan  Abdominal pain, epigastric - Plan:patient had negative endoscopy, trial of omeprazole (PRILOSEC) 40 MG capsule,  if worsening symptoms consider referral to GI.   Return in about 3 months (around 12/02/2014), or if symptoms worsen or fail to improve.  Doris CheadleADVANI, Montford Barg, MD

## 2014-09-01 NOTE — Progress Notes (Signed)
Patient complains of pain in her stomach that started about a week ago Denies any vomiting or diarrhea and having no issues with voiding

## 2014-10-20 ENCOUNTER — Encounter: Payer: Self-pay | Admitting: Internal Medicine

## 2014-10-20 ENCOUNTER — Ambulatory Visit: Payer: Medicaid Other | Attending: Internal Medicine | Admitting: Internal Medicine

## 2014-10-20 VITALS — BP 113/74 | HR 67 | Temp 98.0°F | Resp 16 | Wt 211.0 lb

## 2014-10-20 DIAGNOSIS — N390 Urinary tract infection, site not specified: Secondary | ICD-10-CM | POA: Diagnosis not present

## 2014-10-20 DIAGNOSIS — M62830 Muscle spasm of back: Secondary | ICD-10-CM | POA: Diagnosis not present

## 2014-10-20 DIAGNOSIS — J45909 Unspecified asthma, uncomplicated: Secondary | ICD-10-CM | POA: Diagnosis not present

## 2014-10-20 DIAGNOSIS — I10 Essential (primary) hypertension: Secondary | ICD-10-CM | POA: Insufficient documentation

## 2014-10-20 DIAGNOSIS — R109 Unspecified abdominal pain: Secondary | ICD-10-CM

## 2014-10-20 DIAGNOSIS — K219 Gastro-esophageal reflux disease without esophagitis: Secondary | ICD-10-CM | POA: Insufficient documentation

## 2014-10-20 DIAGNOSIS — G4730 Sleep apnea, unspecified: Secondary | ICD-10-CM | POA: Diagnosis not present

## 2014-10-20 DIAGNOSIS — M545 Low back pain: Secondary | ICD-10-CM | POA: Insufficient documentation

## 2014-10-20 DIAGNOSIS — Z79899 Other long term (current) drug therapy: Secondary | ICD-10-CM | POA: Insufficient documentation

## 2014-10-20 DIAGNOSIS — R319 Hematuria, unspecified: Secondary | ICD-10-CM | POA: Diagnosis not present

## 2014-10-20 LAB — POCT URINALYSIS DIPSTICK
Bilirubin, UA: NEGATIVE
Glucose, UA: NEGATIVE
Ketones, UA: NEGATIVE
Nitrite, UA: NEGATIVE
Protein, UA: NEGATIVE
Spec Grav, UA: 1.025
Urobilinogen, UA: 0.2
pH, UA: 5.5

## 2014-10-20 MED ORDER — CYCLOBENZAPRINE HCL 5 MG PO TABS: 5.0000 mg | ORAL_TABLET | Freq: Every day | ORAL | Status: DC

## 2014-10-20 MED ORDER — CIPROFLOXACIN HCL 500 MG PO TABS: 500.0000 mg | ORAL_TABLET | Freq: Two times a day (BID) | ORAL | Status: DC

## 2014-10-20 NOTE — Progress Notes (Signed)
Patient complains of lower back pain Was seen last week at [facility] for hematuria Patient also states having difficulty voiding

## 2014-10-20 NOTE — Progress Notes (Signed)
MRN: [id number] Name: [name]  Sex: female Age: 49 y.o. DOB: [birth year]  Allergies: No known allergies  Chief Complaint  Patient presents with  . Back Pain    HPI: Patient is 49 y.o. female who comes today complaining of left-sided flank pain urinary urgency frequency and dysuria for  more than 1 week  , as per patient she went to the urgent care and she was told she has blood in her urine and was given antibiotic Bactrim and 10 medications as per patient when she stopped antibiotic her symptoms are getting worse denies any nausea vomiting still has low back pain, denies any fever chills, today her urine dipstick shows blood positive and leukocyte esterase positive.   Past Medical History  Diagnosis Date  . Hypertension   . Asthma   . Gastric reflux   . Syncope and collapse yrs ago  . Osteoarthritis   . Sleep apnea     cpap machine has not used since 2014 due to machine/tubing in bad shape   . History of blood transfusion yrs agio, none recent    Past Surgical History  Procedure Laterality Date  . Cholecystectomy    . Vaginal hysterectomy  2001    Adnexa left in place  . Cardiac catheterization      more than 5 years ago  . Cardiac catheterization  06-07-2012    [facility]: Normal coronary arteries. No significant LVOT/aortic valve gradient  . Colonoscopy with propofol N/A 02/27/2014    Procedure: COLONOSCOPY WITH PROPOFOL;  Surgeon: Charolett Bumpers, MD;  Location: [facility];  Service: Endoscopy;  Laterality: N/A;  . Esophagogastroduodenoscopy (egd) with propofol N/A 02/27/2014    Procedure: ESOPHAGOGASTRODUODENOSCOPY (EGD) WITH PROPOFOL;  Surgeon: Charolett Bumpers, MD;  Location: [facility];  Service: Endoscopy;  Laterality: N/A;  . Exploratory laparotomy  09/2001    ELAP, LOA, R partial oophorectomy, repair of bowel injury      Medication List       This list is accurate as of: 10/20/14 10:31 AM.  Always use your most recent med list.               albuterol 108 (90 BASE) MCG/ACT inhaler  Commonly known as:  PROVENTIL HFA;VENTOLIN HFA  Inhale 2 puffs into the lungs 2 (two) times daily.     atenolol 25 MG tablet  Commonly known as:  TENORMIN  Take 25 mg by mouth every morning.     budesonide-formoterol 160-4.5 MCG/ACT inhaler  Commonly known as:  SYMBICORT  Inhale 2 puffs into the lungs 2 (two) times daily.     ciprofloxacin 500 MG tablet  Commonly known as:  CIPRO  Take 1 tablet (500 mg total) by mouth 2 (two) times daily.     cyclobenzaprine 5 MG tablet  Commonly known as:  FLEXERIL  Take 1 tablet (5 mg total) by mouth at bedtime.     HYDROCODONE-ACETAMINOPHEN PO  Take by mouth.     meclizine 25 MG tablet  Commonly known as:  ANTIVERT  Take 1 tablet (25 mg total) by mouth 3 (three) times daily as needed for nausea.     naproxen 500 MG tablet  Commonly known as:  NAPROSYN  Take 1 tablet (500 mg total) by mouth 2 (two) times daily.     omeprazole 40 MG capsule  Commonly known as:  PRILOSEC  Take 1 capsule (40 mg total) by mouth daily.     SULFAMETHOXAZOLE-TMP DS PO  Take by mouth.  Meds ordered this encounter  Medications  . Sulfamethoxazole-Trimethoprim (SULFAMETHOXAZOLE-TMP DS PO)    Sig: Take by mouth.  Marland Kitchen HYDROCODONE-ACETAMINOPHEN PO    Sig: Take by mouth.  . cyclobenzaprine (FLEXERIL) 5 MG tablet    Sig: Take 1 tablet (5 mg total) by mouth at bedtime.    Dispense:  30 tablet    Refill:  0  . ciprofloxacin (CIPRO) 500 MG tablet    Sig: Take 1 tablet (500 mg total) by mouth 2 (two) times daily.    Dispense:  10 tablet    Refill:  0     There is no immunization history on file for this patient.  History reviewed. No pertinent family history.  History  Substance Use Topics  . Smoking status: Never Smoker   . Smokeless tobacco: Never Used  . Alcohol Use: No    Review of Systems   As noted in HPI  Filed Vitals:   10/20/14 1003  BP: 113/74  Pulse: 67  Temp: 98 F (36.7 C)  Resp:  16    Physical Exam  Physical Exam  Constitutional: No distress.  Eyes: EOM are normal.  Cardiovascular: Normal rate and regular rhythm.   Pulmonary/Chest: Breath sounds normal. No respiratory distress. She has no wheezes. She has no rales.  Abdominal: Soft.  Tenderness with the deep palpation in the left flank, no rebound or guarding, with SLR patient is complaining of muscle tightness    CBC    Component Value Date/Time   WBC 6.5 07/21/2014 2012   WBC 4.5 06/04/2012 1136   RBC 4.80 07/21/2014 2012   RBC 4.49 06/04/2012 1136   HGB 12.8 07/21/2014 2012   HCT 39.3 07/21/2014 2012   PLT 277 07/21/2014 2012   MCV 81.9 07/21/2014 2012   LYMPHSABS 1.5 02/28/2014 1146   LYMPHSABS 1.1 06/04/2012 1136   MONOABS 0.3 02/28/2014 1146   EOSABS 0.0 02/28/2014 1146   EOSABS 0.0 06/04/2012 1136   BASOSABS 0.0 02/28/2014 1146   BASOSABS 0.0 06/04/2012 1136    CMP     Component Value Date/Time   NA 145 07/21/2014 2012   NA 142 06/04/2012 1136   K 3.8 07/21/2014 2012   CL 106 07/21/2014 2012   CO2 27 07/21/2014 2012   GLUCOSE 93 07/21/2014 2012   GLUCOSE 80 06/04/2012 1136   BUN 13 07/21/2014 2012   BUN 10 06/04/2012 1136   CREATININE 0.60 07/21/2014 2012   CREATININE 0.55 01/14/2014 1148   CALCIUM 10.1 07/21/2014 2012   PROT 7.0 02/28/2014 1146   ALBUMIN 3.7 02/28/2014 1146   AST 25 02/28/2014 1146   ALT 26 02/28/2014 1146   ALKPHOS 77 02/28/2014 1146   BILITOT 0.8 02/28/2014 1146   GFRNONAA >90 07/21/2014 2012   GFRNONAA >89 01/14/2014 1148   GFRAA >90 07/21/2014 2012   GFRAA >89 01/14/2014 1148    Lab Results  Component Value Date/Time   CHOL 161 12/10/2013 10:32 AM    No components found for: HGA1C  Lab Results  Component Value Date/Time   AST 25 02/28/2014 11:46 AM    Assessment and Plan  Flank pain - Plan:  Results for orders placed or performed in visit on 10/20/14  Urinalysis Dipstick  Result Value Ref Range   Color, UA yellow    Clarity, UA clear     Glucose, UA neg    Bilirubin, UA neg    Ketones, UA neg    Spec Grav, UA 1.025    Blood, UA small  pH, UA 5.5    Protein, UA neg    Urobilinogen, UA 0.2    Nitrite, UA neg    Leukocytes, UA Trace    Urinalysis Dipstick shows leukocyte esterase positive, will send Urine culture  Hematuria - Plan: I have ordered CT RENAL STONE STUDY  UTI (lower urinary tract infection) - Plan: I prescribed ciprofloxacin (CIPRO) 500 MG tablet, Urine culture  Back muscle spasm - Plan: cyclobenzaprine (FLEXERIL) 5 MG tablet   Health Maintenance  -Mammogram:up-to-date with her mammogram -Vaccinations: Patient declined for flu shot  Return in about 3 months (around 01/19/2015), or if symptoms worsen or fail to improve.  Doris CheadleADVANI, Chelesa Weingartner, MD

## 2014-10-21 LAB — URINE CULTURE
Colony Count: NO GROWTH
Organism ID, Bacteria: NO GROWTH

## 2014-10-22 ENCOUNTER — Ambulatory Visit (HOSPITAL_COMMUNITY): Payer: Medicaid Other

## 2014-11-03 ENCOUNTER — Ambulatory Visit: Payer: Medicaid Other | Admitting: Internal Medicine

## 2014-11-06 ENCOUNTER — Encounter: Payer: Self-pay | Admitting: Internal Medicine

## 2014-11-06 ENCOUNTER — Telehealth: Payer: Self-pay | Admitting: Internal Medicine

## 2014-11-06 ENCOUNTER — Ambulatory Visit: Payer: Medicaid Other | Attending: Internal Medicine | Admitting: Internal Medicine

## 2014-11-06 VITALS — BP 123/87 | HR 76 | Temp 98.1°F | Resp 15 | Wt 212.8 lb

## 2014-11-06 DIAGNOSIS — M545 Low back pain, unspecified: Secondary | ICD-10-CM

## 2014-11-06 DIAGNOSIS — R319 Hematuria, unspecified: Secondary | ICD-10-CM | POA: Diagnosis not present

## 2014-11-06 DIAGNOSIS — M62830 Muscle spasm of back: Secondary | ICD-10-CM | POA: Insufficient documentation

## 2014-11-06 DIAGNOSIS — G8929 Other chronic pain: Secondary | ICD-10-CM | POA: Insufficient documentation

## 2014-11-06 DIAGNOSIS — Z23 Encounter for immunization: Secondary | ICD-10-CM

## 2014-11-06 LAB — POCT URINALYSIS DIPSTICK
Bilirubin, UA: NEGATIVE
Glucose, UA: NEGATIVE
Ketones, UA: NEGATIVE
Nitrite, UA: NEGATIVE
Protein, UA: NEGATIVE
Spec Grav, UA: 1.01
Urobilinogen, UA: 0.2
pH, UA: 7.5

## 2014-11-06 MED ORDER — TRAMADOL HCL 50 MG PO TABS: 50.0000 mg | ORAL_TABLET | Freq: Three times a day (TID) | ORAL | Status: DC | PRN

## 2014-11-06 MED ORDER — BACLOFEN 10 MG PO TABS: 10.0000 mg | ORAL_TABLET | Freq: Three times a day (TID) | ORAL | Status: DC

## 2014-11-06 NOTE — Progress Notes (Signed)
Patient complains of back pain that is getting worse making it harder And harder for her to walk At times feels like her back is being squeezed Patient also states she has been having some blood in her urine

## 2014-11-06 NOTE — Telephone Encounter (Signed)
Pt seen today

## 2014-11-06 NOTE — Telephone Encounter (Signed)
Pt left a voicemail asking if the nurse can call her back. She is seeing blood in her urine.

## 2014-11-06 NOTE — Progress Notes (Signed)
MRN: [id number] Name: Jenna LernerBetsy [name]  Sex: female Age: 49 y.o. DOB: 07/12/1966  Allergies: No known allergies  Chief Complaint  Patient presents with  . Back Pain    HPI: Patient is 49 y.o. female who was recently treated for UTI, comes today still having noticed blood in the urine, patient has lower back pain, she denies any recent fall or trauma, last visit she was prescribed Flexeril as per patient there does not help much, denies any fever chills nausea vomiting, denies any change in bowel habits, today her urine still shows blood, CT scan was ordered on the last visit but has not been done yet, needs anotherorder in the system  Past Medical History  Diagnosis Date  . Hypertension   . Asthma   . Gastric reflux   . Syncope and collapse yrs ago  . Osteoarthritis   . Sleep apnea     cpap machine has not used since 2014 due to machine/tubing in bad shape   . History of blood transfusion yrs agio, none recent    Past Surgical History  Procedure Laterality Date  . Cholecystectomy    . Vaginal hysterectomy  2001    Adnexa left in place  . Cardiac catheterization      more than 5 years ago  . Cardiac catheterization  06-07-2012    [facility]: Normal coronary arteries. No significant LVOT/aortic valve gradient  . Colonoscopy with propofol N/A 02/27/2014    Procedure: COLONOSCOPY WITH PROPOFOL;  Surgeon: Charolett BumpersMartin K Johnson, MD;  Location: [facility];  Service: Endoscopy;  Laterality: N/A;  . Esophagogastroduodenoscopy (egd) with propofol N/A 02/27/2014    Procedure: ESOPHAGOGASTRODUODENOSCOPY (EGD) WITH PROPOFOL;  Surgeon: Charolett BumpersMartin K Johnson, MD;  Location: [facility];  Service: Endoscopy;  Laterality: N/A;  . Exploratory laparotomy  09/2001    ELAP, LOA, R partial oophorectomy, repair of bowel injury      Medication List       This list is accurate as of: 11/06/14 12:43 PM.  Always use your most recent med list.               albuterol 108 (90 BASE) MCG/ACT inhaler    Commonly known as:  PROVENTIL HFA;VENTOLIN HFA  Inhale 2 puffs into the lungs 2 (two) times daily.     atenolol 25 MG tablet  Commonly known as:  TENORMIN  Take 25 mg by mouth every morning.     baclofen 10 MG tablet  Commonly known as:  LIORESAL  Take 1 tablet (10 mg total) by mouth 3 (three) times daily.     budesonide-formoterol 160-4.5 MCG/ACT inhaler  Commonly known as:  SYMBICORT  Inhale 2 puffs into the lungs 2 (two) times daily.     ciprofloxacin 500 MG tablet  Commonly known as:  CIPRO  Take 1 tablet (500 mg total) by mouth 2 (two) times daily.     cyclobenzaprine 5 MG tablet  Commonly known as:  FLEXERIL  Take 1 tablet (5 mg total) by mouth at bedtime.     HYDROCODONE-ACETAMINOPHEN PO  Take by mouth.     meclizine 25 MG tablet  Commonly known as:  ANTIVERT  Take 1 tablet (25 mg total) by mouth 3 (three) times daily as needed for nausea.     naproxen 500 MG tablet  Commonly known as:  NAPROSYN  Take 1 tablet (500 mg total) by mouth 2 (two) times daily.     omeprazole 40 MG capsule  Commonly known as:  PRILOSEC  Take 1 capsule (40 mg total) by mouth daily.     SULFAMETHOXAZOLE-TMP DS PO  Take by mouth.     traMADol 50 MG tablet  Commonly known as:  ULTRAM  Take 1 tablet (50 mg total) by mouth every 8 (eight) hours as needed for moderate pain.        Meds ordered this encounter  Medications  . traMADol (ULTRAM) 50 MG tablet    Sig: Take 1 tablet (50 mg total) by mouth every 8 (eight) hours as needed for moderate pain.    Dispense:  30 tablet    Refill:  0  . baclofen (LIORESAL) 10 MG tablet    Sig: Take 1 tablet (10 mg total) by mouth 3 (three) times daily.    Dispense:  30 each    Refill:  0    Immunization History  Administered Date(s) Administered  . Influenza,inj,Quad PF,36+ Mos 11/06/2014    History reviewed. No pertinent family history.  History  Substance Use Topics  . Smoking status: Never Smoker   . Smokeless tobacco: Never Used   . Alcohol Use: No    Review of Systems   As noted in HPI  Filed Vitals:   11/06/14 1157  BP: 123/87  Pulse: 76  Temp: 98.1 F (36.7 C)  Resp: 15    Physical Exam  Physical Exam  Eyes: EOM are normal. Pupils are equal, round, and reactive to light.  Cardiovascular: Normal rate and regular rhythm.   Pulmonary/Chest: Breath sounds normal. No respiratory distress. She has no wheezes. She has no rales.  Abdominal: Soft. There is no tenderness. There is no rebound.  Musculoskeletal:  Patient complains of muscle tightness in the back and thigh with SLR test, equal strength both lower extremities DTR 2+    CBC    Component Value Date/Time   WBC 6.5 07/21/2014 2012   WBC 4.5 06/04/2012 1136   RBC 4.80 07/21/2014 2012   RBC 4.49 06/04/2012 1136   HGB 12.8 07/21/2014 2012   HCT 39.3 07/21/2014 2012   PLT 277 07/21/2014 2012   MCV 81.9 07/21/2014 2012   LYMPHSABS 1.5 02/28/2014 1146   LYMPHSABS 1.1 06/04/2012 1136   MONOABS 0.3 02/28/2014 1146   EOSABS 0.0 02/28/2014 1146   EOSABS 0.0 06/04/2012 1136   BASOSABS 0.0 02/28/2014 1146   BASOSABS 0.0 06/04/2012 1136    CMP     Component Value Date/Time   NA 145 07/21/2014 2012   NA 142 06/04/2012 1136   K 3.8 07/21/2014 2012   CL 106 07/21/2014 2012   CO2 27 07/21/2014 2012   GLUCOSE 93 07/21/2014 2012   GLUCOSE 80 06/04/2012 1136   BUN 13 07/21/2014 2012   BUN 10 06/04/2012 1136   CREATININE 0.60 07/21/2014 2012   CREATININE 0.55 01/14/2014 1148   CALCIUM 10.1 07/21/2014 2012   PROT 7.0 02/28/2014 1146   ALBUMIN 3.7 02/28/2014 1146   AST 25 02/28/2014 1146   ALT 26 02/28/2014 1146   ALKPHOS 77 02/28/2014 1146   BILITOT 0.8 02/28/2014 1146   GFRNONAA >90 07/21/2014 2012   GFRNONAA >89 01/14/2014 1148   GFRAA >90 07/21/2014 2012   GFRAA >89 01/14/2014 1148    Lab Results  Component Value Date/Time   CHOL 161 12/10/2013 10:32 AM    No components found for: HGA1C  Lab Results  Component Value Date/Time    AST 25 02/28/2014 11:46 AM    Assessment and Plan  Blood in urine - Plan:  Results  for orders placed or performed in visit on 11/06/14  Urinalysis Dipstick  Result Value Ref Range   Color, UA yellow    Clarity, UA clear    Glucose, UA neg    Bilirubin, UA neg    Ketones, UA neg    Spec Grav, UA 1.010    Blood, UA trace-intact    pH, UA 7.5    Protein, UA neg    Urobilinogen, UA 0.2    Nitrite, UA neg    Leukocytes, UA small (1+)    Urinalysis Dipstick shows persistent hematuria, ordered CT Abdomen Pelvis Wo Contrast  Chronic low back pain - Plan: traMADol (ULTRAM) 50 MG tablet, DG Lumbar Spine Complete  Needs flu shot Flu shot given today  Back muscle spasm - Plan: prescribed baclofen (LIORESAL) 10 MG tablet Advised patient to apply heating pad,  Health Maintenance - -Vaccinations:  Flu shot today   Return in about 3 months (around 02/05/2015), or if symptoms worsen or fail to improve.  Doris Cheadle, MD

## 2014-11-07 ENCOUNTER — Telehealth: Payer: Self-pay | Admitting: Internal Medicine

## 2014-11-07 NOTE — Telephone Encounter (Signed)
Patient called stating that medicaid did not approve her CT Scan and would like to know what she can do next. Please f/u with pt.

## 2014-11-10 ENCOUNTER — Ambulatory Visit (HOSPITAL_COMMUNITY): Admission: RE | Admit: 2014-11-10 | Payer: Medicaid Other | Source: Ambulatory Visit

## 2014-11-10 ENCOUNTER — Ambulatory Visit (HOSPITAL_COMMUNITY)
Admission: RE | Admit: 2014-11-10 | Discharge: 2014-11-10 | Disposition: A | Discharge status: Home / Self Care | Payer: Medicaid Other | Source: Ambulatory Visit | Attending: Internal Medicine | Admitting: Internal Medicine

## 2014-11-10 ENCOUNTER — Other Ambulatory Visit: Payer: Self-pay

## 2014-11-10 DIAGNOSIS — G8929 Other chronic pain: Secondary | ICD-10-CM

## 2014-11-10 DIAGNOSIS — M545 Low back pain, unspecified: Secondary | ICD-10-CM

## 2014-11-10 DIAGNOSIS — M79604 Pain in right leg: Secondary | ICD-10-CM | POA: Diagnosis not present

## 2014-11-10 DIAGNOSIS — M5136 Other intervertebral disc degeneration, lumbar region: Secondary | ICD-10-CM | POA: Insufficient documentation

## 2014-11-10 DIAGNOSIS — R319 Hematuria, unspecified: Secondary | ICD-10-CM

## 2014-11-10 DIAGNOSIS — M479 Spondylosis, unspecified: Secondary | ICD-10-CM | POA: Insufficient documentation

## 2014-11-11 ENCOUNTER — Ambulatory Visit: Payer: Medicaid Other | Admitting: Internal Medicine

## 2014-11-11 ENCOUNTER — Telehealth: Payer: Self-pay | Admitting: Internal Medicine

## 2014-11-11 NOTE — Telephone Encounter (Signed)
Pt is concerned about the reason of why she was diagnosed with "stones".

## 2014-11-12 ENCOUNTER — Telehealth: Payer: Self-pay | Admitting: Internal Medicine

## 2014-11-12 ENCOUNTER — Telehealth: Payer: Self-pay

## 2014-11-12 MED ORDER — ATENOLOL 25 MG PO TABS: 25.0000 mg | ORAL_TABLET | Freq: Every morning | ORAL | Status: DC

## 2014-11-12 NOTE — Telephone Encounter (Signed)
Patient called requesting a refill on her atenolol Prescription sent to pharmacy on file

## 2014-11-12 NOTE — Telephone Encounter (Signed)
Patient calling to follow up on MRI results from Monday 11/10/14. Please assist.

## 2014-11-12 NOTE — Telephone Encounter (Signed)
Pt aware of X-Ray results   Notes Recorded by Doris Cheadleeepak Advani, MD on 11/10/2014 at 6:00 PM Call and let the patient know that her  x-ray reported IMPRESSION: 1. Thoracic and lumbar spondylosis with mild lumbar degenerative disc disease. No malalignment or fracture observed. 2. Prominent stool throughout the colon favors constipation.  Advised patient to increase the fluids and increase fiber in her diet to prevent constipation.

## 2014-11-12 NOTE — Telephone Encounter (Signed)
Patient is calling to request a med refill for atenolol (TENORMIN) 25 MG tablet , patients last OV was on 1/28 and uses [facility] on [address]. Please f/u with pt.

## 2014-11-13 NOTE — Telephone Encounter (Signed)
Let the Patient know that she has been referred to urology for further evaluation.

## 2015-02-01 NOTE — Discharge Summary (Signed)
PATIENT NAME:  [name], [name] MR#:  [id number] DATE OF BIRTH:  November 28, 1965  DATE OF ADMISSION:  03/17/2012 DATE OF DISCHARGE:  03/18/2012  ADMITTING DIAGNOSIS: Chest pain, shortness of breath.   DISCHARGE DIAGNOSES:  1. Chest pain, very atypical in nature, likely musculoskeletal in nature, had a cardiac catheterization in June 2011 that was completely normal. Cardiac enzymes during the hospitalization were normal. The patient responded to anti-inflammatories.  2. Asthma with some shortness of breath with mild asthma flare, now improved.  3. Hypertension.  4. Morbid obesity.  5. Osteoarthritis.   CONSULTANTS: None.   PERTINENT LABS AND EVALUATIONS: EKG on admission showed normal sinus rhythm, minimal voltage criteria for left ventricular hypertrophy. CPK was 115, CK-MB 0.5. Troponin less than 0.02. Glucose 74, BUN 8, creatinine 0.72, sodium 147, potassium 3.3, chloride 112, CO2 29. Her WBC is 6.7, hemoglobin 12.2, platelet count 263. PA and lateral chest x-ray showed no acute cardiopulmonary processes. Subsequent troponins remained less than 0.02. Magnesium this morning is 1.8. Fasting lipid panel: Cholesterol 167, triglycerides 58, LDL 95.   HOSPITAL COURSE: Please refer to the History and Physical done by the admitting physician. The patient is a 49 year old African American female who presented with complaint of left-sided chest pain. She had a cardiac catheterization which was negative in June 2011. The patient also complained of some shortness of breath. Due to her symptoms, we were asked to admit the patient.  Her EKG was unrevealing. Cardiac enzymes were negative. She had a significant reproducible component to her chest pain. She also had pain worse with moving from side to side. She was also complaining of some shortness of breath. The patient was kept and observed in light of her having a cardiac catheterization within the past year and a half. Her enzymes were monitored. She was given  anti-inflammatories with most of the pain now resolved. She is doing much better and is stable for discharge. She also was thought to have some mild asthma exacerbation, which was treated with nebulizers and a dose of p.o. prednisone. She has no more wheezing. No shortness of breath. She is currently stable for discharge.   DISCHARGE MEDICATIONS:  1. Losartan 100 daily.  2. Omeprazole 40 daily.  3. Albuterol ipratropium 2 puffs 4 times per day as needed.  4. Motrin 400 p.o. q. 6 hours for the next 24 hours and then q. 8 p.r.n.   DIET: Low sodium.   ACTIVITY: As tolerated.   TIMEFRAME FOR FOLLOWUP: 1 to 2 weeks with Dr. Beverely RisenFozia Khan. At that time recommend checking her basic metabolic panel, making sure her potassium is stable.   TIME SPENT:  32 minutes.   ____________________________ Lacie ScottsShreyang H. Allena KatzPatel, MD shp:bjt D: 03/18/2012 11:12:44 ET T: 03/19/2012 14:11:38 ET JOB#: [id number]  cc: Amil Moseman H. Allena KatzPatel, MD, <Dictator> Lyndon CodeFozia M. Khan, MD Charise CarwinSHREYANG H Yovanny Coats MD ELECTRONICALLY SIGNED 03/28/2012 11:39

## 2015-02-01 NOTE — H&P (Signed)
PATIENT NAME:  [name], Jenna MR#:  [id number] DATE OF BIRTH:  1966-08-24  DATE OF ADMISSION:  03/17/2012  INDICATION FOR ADMISSION: Chest pain.  ED REFERRING PHYSICIAN: Joseph ArtAlice Finnell, MD  ADMITTING PHYSICIAN: Stephanie AcreVishal Javeah Loeza, MD  PRIMARY CARE PHYSICIAN: Beverely RisenFozia Khan, MD  CHIEF COMPLAINT: Chest pain since last night.  HISTORY OF PRESENT ILLNESS: This is a 49 year old African American female with past medical history of hypertension, gastroesophageal reflux disease, and history of chest pain status post negative catheterization in the past who presented with the above chief complaint. The patient stated that at about 1 o'clock  a.m. yesterday morning she started to experience some chest pain associated with her shortness of breath. She described the chest pain as substernal, associated with shortness of breath, pressure like, about a 7 to 8 out of 10 that progressed to 9 out of 10 until she presented to the ED tonight.  On arrival to the ED, she was given two sublingual nitroglycerin which provided some relief to her.  However, she stated her pain is back to 9 out of 10 right now. It is not associated with any diaphoresis, nausea or vomiting, jaw pain or left arm radiation. She says she has not experienced chest pain like this in the past before. She was at rest when she first experienced chest pain. She is being admitted by the hospitalist service for further workup and management.  Her primary care physician is Dr. Beverely RisenFozia Khan.   PAST MEDICAL HISTORY:  1. Asthma. 2. Hypertension. 3. Obesity. 4. Osteoarthritis.  HOME MEDICATIONS: 1. Losartan 100 mg daily. 2. Nexium 20 mg daily. 3. Albuterol one puff p.r.n.   PAST SURGICAL HISTORY:  1. Cholecystectomy. 2. Abdominal hysterectomy with bilateral salpingo-oophorectomy.   SOCIAL HISTORY: No tobacco, alcohol or drug use. Lives at home alone. She is on disability.  FAMILY HISTORY: No significant family history.   REVIEW OF SYSTEMS:  CONSTITUTIONAL: Chest pain. No fever, fatigue, weight loss or weight gain. EYES: No blurred vision, double vision, pain or redness. ENT: No tinnitus, ear pain, or hearing loss. RESPIRATORY: No cough, no wheeze. Positive shortness of breath. Positive for asthma. CARDIOVASCULAR: Positive chest pain. No orthopnea, edema, arrhythmia, dyspnea on exertion, palpitations, or syncope. Positive high blood pressure. GASTROINTESTINAL: No nausea, vomiting, diarrhea, or abdominal pain. Positive gastroesophageal reflux disease. GENITOURINARY: No dysuria or hematuria. ENDOCRINE: No polyuria or nocturia or thyroid problems. HEME/LYMPH: No anemia, easy bruising, bleeding, or swollen glands. INTEGUMENT: No acne, rash, lesions, or change in hair, mole, or skin. MUSCULOSKELETAL: No pain in the neck, back, shoulders, knees or hips. Positive osteoarthritis.  NEURO: No numbness, weakness, dysarthria, epilepsy, tremor, vertigo, ataxia, or headache. PSYCH: No anxiety, insomnia, ADD, OCD, bipolar, depression, schizophrenia or nervousness.   PHYSICAL EXAMINATION:  VITALS: While in the ED temperature is 98.6, blood pressure 118/63, pulse 81, respirations 20, and pulse oximetry 97%.  GENERAL:  Well-developed, well-nourished mildly obese female lying in bed in no acute distress.   HEENT: Pupils are equally round and reactive to light. Extraocular movements are intact. No scleral icterus. No conjunctivitis. No difficulty hearing.   NECK: No thyroid enlargement, tenderness, or nodules. Neck is supple and nontender. No masses. No adenopathy or JVD. No carotid bruits.  LUNGS: Clear to auscultation bilaterally. No rales, rhonchi, or crackles. She does have diminished breath sounds in the bilateral lower lung fields. She does not have any use of accessory muscles.   HEART: Regular rate and regular rhythm. No murmurs. S1 and S2 was auscultated. PMI is  not lateralized. Lower extremity edema. Chest tender to palpation between the sternum and  reproducible chest pain on deep inspiration.  ABDOMEN: Soft and nontender and nondistended. Positive bowel sounds. Obese abdomen.   MUSCULOSKELETAL: 5/5 strength in bilateral upper and lower extremities. No cyanosis or degenerative joint disease.   SKIN: No rash, lesions, erythema, or nodules. Skin is warm and dry.   LYMPH: No adenopathy in the cervical, axillary, or supraclavicular regions.   NEURO: Cranial nerves II through XII grossly intact. Motor and sensory are intact.  PSYCH: Alert and oriented to person, time, and place. Cooperative. Good judgment is noted.  LABS/STUDIES: BMP: Sodium 147, potassium 3.3, chloride 112, bicarbonate 29, creatinine 0.72, BUN 8, glucose 74. First set of enzymes: CK 115, MB 0.5, troponin less than 0.02. White blood cell count 6.7, hemoglobin 12.3, hematocrit 37.3, and platelet count 263.   EKG shows normal sinus rhythm, no acute ST-T wave changes, rate of about 70.  Chest x-ray shows no acute cardiopulmonary process.   ASSESSMENT AND PLAN: A 49 year old female with past medical history of hypertension, asthma, obesity, and osteoarthritis admitted for chest pain. 1. Chest pain - Differential diagnosis includes acute coronary syndrome, pleuritis, and costochondritis. The patient does have first set of enzymes that are negative. We will check a second set of enzymes right now.  At the time of my evaluation, she does have reproducible chest pain with deep inspiration and with palpation of the substernal area. We will give her a trial of ibuprofen 600 mg x1. If she has relief with this then could have some type of pleuritis or possibly costochondritis. I reviewed the records and shows that she did have a cardiac catheterization done in June of 2011 by Dr. Juliann Pares that did show an ejection fraction estimated at 60%. There is no angiographic evidence to suggest coronary artery disease. We will admit her for her chest pain and check chest cardiac enzymes for a total  x3, continue medical management (ASA, beta blocker, and statin), check a lipid panel, give aspirin and p.r.n. sublingual nitroglycerine, and follow over the next 24 hours. Continue with supportive care. If her enzymes rise we may need to consider cardiology consultation with Dr. Juliann Pares. 2. Asthma - currently on albuterol inhalation one puff twice a day. She was previously taken off Advair after her last set of PFTs on the patient were normal and she was not having any benefit from Advair. She does not have any wheezing on examination, but she does have decreased breath sounds noted in lower fields. She may be having some type of bronchospastic event secondary to environmental changes as this is a trigger for her asthma. We will schedule DuoNebs over the next 24 hours. This may assist in her chest discomfort.  3. Hypertension - we will continue with her home medication of Losartan 100 mg daily. 4. Gastroesophageal reflux disease - continue with omeprazole 40 mg daily. 5. Low potassium - noted to be at about 3.3. We will recheck her a.m. labs. If it is still low we will go ahead and replace.  CODE STATUS: FULL CODE.   TIME SPENT ADMITTING AND EVALUATING PATIENT: 45 minutes.  ____________________________ Stephanie Acre, MD vm:slb D: 03/17/2012 01:59:35 ET T: 03/17/2012 09:46:51 ET JOB#: [id number]  cc: Stephanie Acre, MD, <Dictator> Lyndon Code, MD Stephanie Acre MD ELECTRONICALLY SIGNED 03/17/2012 22:23

## 2015-02-16 ENCOUNTER — Other Ambulatory Visit: Payer: Self-pay | Admitting: Internal Medicine

## 2015-02-16 DIAGNOSIS — Z1231 Encounter for screening mammogram for malignant neoplasm of breast: Secondary | ICD-10-CM

## 2015-03-05 ENCOUNTER — Ambulatory Visit: Payer: Medicaid Other | Attending: Internal Medicine | Admitting: Internal Medicine

## 2015-03-05 ENCOUNTER — Encounter: Payer: Self-pay | Admitting: Internal Medicine

## 2015-03-05 VITALS — BP 128/81 | HR 73 | Temp 98.0°F | Resp 16 | Wt 217.6 lb

## 2015-03-05 DIAGNOSIS — Z792 Long term (current) use of antibiotics: Secondary | ICD-10-CM | POA: Insufficient documentation

## 2015-03-05 DIAGNOSIS — K219 Gastro-esophageal reflux disease without esophagitis: Secondary | ICD-10-CM | POA: Diagnosis not present

## 2015-03-05 DIAGNOSIS — G4730 Sleep apnea, unspecified: Secondary | ICD-10-CM | POA: Insufficient documentation

## 2015-03-05 DIAGNOSIS — J45909 Unspecified asthma, uncomplicated: Secondary | ICD-10-CM | POA: Diagnosis not present

## 2015-03-05 DIAGNOSIS — Z9049 Acquired absence of other specified parts of digestive tract: Secondary | ICD-10-CM | POA: Insufficient documentation

## 2015-03-05 DIAGNOSIS — G8929 Other chronic pain: Secondary | ICD-10-CM | POA: Diagnosis not present

## 2015-03-05 DIAGNOSIS — M545 Low back pain, unspecified: Secondary | ICD-10-CM

## 2015-03-05 DIAGNOSIS — I10 Essential (primary) hypertension: Secondary | ICD-10-CM | POA: Diagnosis not present

## 2015-03-05 DIAGNOSIS — Z79899 Other long term (current) drug therapy: Secondary | ICD-10-CM | POA: Insufficient documentation

## 2015-03-05 DIAGNOSIS — Z791 Long term (current) use of non-steroidal anti-inflammatories (NSAID): Secondary | ICD-10-CM | POA: Diagnosis not present

## 2015-03-05 DIAGNOSIS — Z7951 Long term (current) use of inhaled steroids: Secondary | ICD-10-CM | POA: Diagnosis not present

## 2015-03-05 DIAGNOSIS — M1990 Unspecified osteoarthritis, unspecified site: Secondary | ICD-10-CM | POA: Insufficient documentation

## 2015-03-05 DIAGNOSIS — R109 Unspecified abdominal pain: Secondary | ICD-10-CM

## 2015-03-05 LAB — POCT URINALYSIS DIPSTICK
Bilirubin, UA: NEGATIVE
Glucose, UA: NEGATIVE
Ketones, UA: NEGATIVE
Leukocytes, UA: NEGATIVE
Nitrite, UA: NEGATIVE
Protein, UA: NEGATIVE
Spec Grav, UA: 1.025
Urobilinogen, UA: 1
pH, UA: 7

## 2015-03-05 MED ORDER — TRAMADOL HCL 50 MG PO TABS: 50.0000 mg | ORAL_TABLET | Freq: Every day | ORAL | Status: DC | PRN

## 2015-03-05 MED ORDER — NAPROXEN 500 MG PO TABS: 500.0000 mg | ORAL_TABLET | Freq: Two times a day (BID) | ORAL | Status: DC

## 2015-03-05 MED ORDER — ATENOLOL 25 MG PO TABS: 25.0000 mg | ORAL_TABLET | Freq: Every morning | ORAL | Status: DC

## 2015-03-05 NOTE — Progress Notes (Signed)
Patient complains of flank pain and burning when she voids For the past two weeks

## 2015-03-05 NOTE — Progress Notes (Signed)
MRN: [id number] Name: [name]  Sex: female Age: 49 y.o. DOB: 1966-01-30  Allergies: No known allergies  Chief Complaint  Patient presents with  . Flank Pain    HPI: Patient is 49 y.o. female who reported to have flank pain low back pain for the last few days also reported to have foul-smelling urine, denies any fever chills nausea vomiting any change in bowel habits denies any vaginal discharge, previous x-ray reviewed patient has arthritis in the back.  Past Medical History  Diagnosis Date  . Hypertension   . Asthma   . Gastric reflux   . Syncope and collapse yrs ago  . Osteoarthritis   . Sleep apnea     cpap machine has not used since 2014 due to machine/tubing in bad shape   . History of blood transfusion yrs agio, none recent    Past Surgical History  Procedure Laterality Date  . Cholecystectomy    . Vaginal hysterectomy  2001    Adnexa left in place  . Cardiac catheterization      more than 5 years ago  . Cardiac catheterization  06-07-2012    [facility]: Normal coronary arteries. No significant LVOT/aortic valve gradient  . Colonoscopy with propofol N/A 02/27/2014    Procedure: COLONOSCOPY WITH PROPOFOL;  Surgeon: Charolett Bumpers, MD;  Location: [facility];  Service: Endoscopy;  Laterality: N/A;  . Esophagogastroduodenoscopy (egd) with propofol N/A 02/27/2014    Procedure: ESOPHAGOGASTRODUODENOSCOPY (EGD) WITH PROPOFOL;  Surgeon: Charolett Bumpers, MD;  Location: [facility];  Service: Endoscopy;  Laterality: N/A;  . Exploratory laparotomy  09/2001    ELAP, LOA, R partial oophorectomy, repair of bowel injury      Medication List       This list is accurate as of: 03/05/15  3:13 PM.  Always use your most recent med list.               albuterol 108 (90 BASE) MCG/ACT inhaler  Commonly known as:  PROVENTIL HFA;VENTOLIN HFA  Inhale 2 puffs into the lungs 2 (two) times daily.     atenolol 25 MG tablet  Commonly known as:  TENORMIN  Take 1 tablet (25 mg  total) by mouth every morning.     baclofen 10 MG tablet  Commonly known as:  LIORESAL  Take 1 tablet (10 mg total) by mouth 3 (three) times daily.     budesonide-formoterol 160-4.5 MCG/ACT inhaler  Commonly known as:  SYMBICORT  Inhale 2 puffs into the lungs 2 (two) times daily.     ciprofloxacin 500 MG tablet  Commonly known as:  CIPRO  Take 1 tablet (500 mg total) by mouth 2 (two) times daily.     cyclobenzaprine 5 MG tablet  Commonly known as:  FLEXERIL  Take 1 tablet (5 mg total) by mouth at bedtime.     HYDROCODONE-ACETAMINOPHEN PO  Take by mouth.     meclizine 25 MG tablet  Commonly known as:  ANTIVERT  Take 1 tablet (25 mg total) by mouth 3 (three) times daily as needed for nausea.     naproxen 500 MG tablet  Commonly known as:  NAPROSYN  Take 1 tablet (500 mg total) by mouth 2 (two) times daily with a meal.     omeprazole 40 MG capsule  Commonly known as:  PRILOSEC  Take 1 capsule (40 mg total) by mouth daily.     SULFAMETHOXAZOLE-TMP DS PO  Take by mouth.     traMADol 50 MG  tablet  Commonly known as:  ULTRAM  Take 1 tablet (50 mg total) by mouth daily as needed for moderate pain.        Meds ordered this encounter  Medications  . traMADol (ULTRAM) 50 MG tablet    Sig: Take 1 tablet (50 mg total) by mouth daily as needed for moderate pain.    Dispense:  30 tablet    Refill:  0  . atenolol (TENORMIN) 25 MG tablet    Sig: Take 1 tablet (25 mg total) by mouth every morning.    Dispense:  30 tablet    Refill:  3  . naproxen (NAPROSYN) 500 MG tablet    Sig: Take 1 tablet (500 mg total) by mouth 2 (two) times daily with a meal.    Dispense:  60 tablet    Refill:  3    Immunization History  Administered Date(s) Administered  . Influenza,inj,Quad PF,36+ Mos 11/06/2014    History reviewed. No pertinent family history.  History  Substance Use Topics  . Smoking status: Never Smoker   . Smokeless tobacco: Never Used  . Alcohol Use: No    Review of  Systems   As noted in HPI  Filed Vitals:   03/05/15 1422  BP: 128/81  Pulse: 73  Temp: 98 F (36.7 C)  Resp: 16    Physical Exam  Physical Exam  Eyes: EOM are normal. Pupils are equal, round, and reactive to light.  Cardiovascular: Normal rate and regular rhythm.   Pulmonary/Chest: Breath sounds normal. No respiratory distress. She has no wheezes. She has no rales.  Abdominal: Soft. There is no tenderness. There is no rebound and no guarding.  No CVA tenderness  Musculoskeletal:  Midline low lumbar spinal tenderness, equal strength both lower extremities SLR negative    CBC    Component Value Date/Time   WBC 6.5 07/21/2014 2012   WBC 13.7* 05/19/2013 2105   WBC 4.5 06/04/2012 1136   RBC 4.80 07/21/2014 2012   RBC 4.35 05/19/2013 2105   RBC 4.49 06/04/2012 1136   HGB 12.8 07/21/2014 2012   HGB 11.9* 05/19/2013 2105   HCT 39.3 07/21/2014 2012   HCT 35.4 05/19/2013 2105   PLT 277 07/21/2014 2012   PLT 285 05/19/2013 2105   MCV 81.9 07/21/2014 2012   MCV 81 05/19/2013 2105   LYMPHSABS 1.5 02/28/2014 1146   LYMPHSABS 1.1 06/04/2012 1136   LYMPHSABS 1.4 03/17/2012 0549   MONOABS 0.3 02/28/2014 1146   MONOABS 0.3 03/17/2012 0549   EOSABS 0.0 02/28/2014 1146   EOSABS 0.0 06/04/2012 1136   EOSABS 0.1 03/17/2012 0549   BASOSABS 0.0 02/28/2014 1146   BASOSABS 0.0 06/04/2012 1136   BASOSABS 0.0 03/17/2012 0549    CMP     Component Value Date/Time   NA 145 07/21/2014 2012   NA 140 05/19/2013 2105   NA 142 06/04/2012 1136   K 3.8 07/21/2014 2012   K 3.5 05/19/2013 2105   CL 106 07/21/2014 2012   CL 109* 05/19/2013 2105   CO2 27 07/21/2014 2012   CO2 26 05/19/2013 2105   GLUCOSE 93 07/21/2014 2012   GLUCOSE 105* 05/19/2013 2105   GLUCOSE 80 06/04/2012 1136   BUN 13 07/21/2014 2012   BUN 17 05/19/2013 2105   BUN 10 06/04/2012 1136   CREATININE 0.60 07/21/2014 2012   CREATININE 0.55 01/14/2014 1148   CREATININE 0.54* 05/19/2013 2105   CALCIUM 10.1  07/21/2014 2012   CALCIUM 9.2 05/19/2013 2105  PROT 7.0 02/28/2014 1146   PROT 7.5 09/26/2012 1003   ALBUMIN 3.7 02/28/2014 1146   ALBUMIN 3.3* 09/26/2012 1003   AST 25 02/28/2014 1146   AST 10* 09/26/2012 1003   ALT 26 02/28/2014 1146   ALT 18 09/26/2012 1003   ALKPHOS 77 02/28/2014 1146   ALKPHOS 117 09/26/2012 1003   BILITOT 0.8 02/28/2014 1146   GFRNONAA >90 07/21/2014 2012   GFRNONAA >89 01/14/2014 1148   GFRNONAA >60 05/19/2013 2105   GFRAA >90 07/21/2014 2012   GFRAA >89 01/14/2014 1148   GFRAA >60 05/19/2013 2105    Lab Results  Component Value Date/Time   CHOL 161 12/10/2013 10:32 AM    Lab Results  Component Value Date/Time   HGBA1C 5.5 12/10/2013 11:40 AM    Lab Results  Component Value Date/Time   AST 25 02/28/2014 11:46 AM   AST 10* 09/26/2012 10:03 AM    Assessment and Plan  Chronic low back pain/arthritis - Plan: traMADol (ULTRAM) 50 MG tablet, naproxen (NAPROSYN) 500 MG tablet  Flank pain - Plan:  Results for orders placed or performed in visit on 03/05/15  Urinalysis Dipstick  Result Value Ref Range   Color, UA yellow    Clarity, UA clear    Glucose, UA neg    Bilirubin, UA neg    Ketones, UA neg    Spec Grav, UA 1.025    Blood, UA trace    pH, UA 7.0    Protein, UA neg    Urobilinogen, UA 1.0    Nitrite, UA neg    Leukocytes, UA Negative    Urinalysis Dipstick Is negative for nitrites or leukocytes, previous urine cultures have been negative, still has some trace blood, patient has already been referred to urology for further evaluation.  Return in about 3 months (around 06/05/2015), or if symptoms worsen or fail to improve.   This note has been created with Education officer, environmental. Any transcriptional errors are unintentional.    Doris Cheadle, MD

## 2015-03-17 ENCOUNTER — Ambulatory Visit (HOSPITAL_COMMUNITY)
Admission: RE | Admit: 2015-03-17 | Discharge: 2015-03-17 | Disposition: A | Discharge status: Home / Self Care | Payer: Medicaid Other | Source: Ambulatory Visit | Attending: Internal Medicine | Admitting: Internal Medicine

## 2015-03-17 DIAGNOSIS — Z1231 Encounter for screening mammogram for malignant neoplasm of breast: Secondary | ICD-10-CM | POA: Diagnosis present

## 2015-03-20 ENCOUNTER — Other Ambulatory Visit: Payer: Self-pay | Admitting: Internal Medicine

## 2015-03-20 DIAGNOSIS — R928 Other abnormal and inconclusive findings on diagnostic imaging of breast: Secondary | ICD-10-CM

## 2015-03-26 ENCOUNTER — Ambulatory Visit
Admission: RE | Admit: 2015-03-26 | Discharge: 2015-03-26 | Disposition: A | Discharge status: Home / Self Care | Payer: Medicaid Other | Source: Ambulatory Visit | Attending: Internal Medicine | Admitting: Internal Medicine

## 2015-03-26 DIAGNOSIS — R928 Other abnormal and inconclusive findings on diagnostic imaging of breast: Secondary | ICD-10-CM

## 2015-06-26 ENCOUNTER — Emergency Department (HOSPITAL_COMMUNITY)
Admission: EM | Admit: 2015-06-26 | Discharge: 2015-06-26 | Disposition: A | Discharge status: Home / Self Care | Payer: Medicaid Other | Attending: Emergency Medicine | Admitting: Emergency Medicine

## 2015-06-26 ENCOUNTER — Encounter (HOSPITAL_COMMUNITY): Payer: Self-pay | Admitting: *Deleted

## 2015-06-26 ENCOUNTER — Emergency Department (HOSPITAL_COMMUNITY): Payer: Medicaid Other

## 2015-06-26 DIAGNOSIS — M1990 Unspecified osteoarthritis, unspecified site: Secondary | ICD-10-CM | POA: Diagnosis not present

## 2015-06-26 DIAGNOSIS — Z8669 Personal history of other diseases of the nervous system and sense organs: Secondary | ICD-10-CM | POA: Diagnosis not present

## 2015-06-26 DIAGNOSIS — Z79899 Other long term (current) drug therapy: Secondary | ICD-10-CM | POA: Diagnosis not present

## 2015-06-26 DIAGNOSIS — R072 Precordial pain: Secondary | ICD-10-CM | POA: Insufficient documentation

## 2015-06-26 DIAGNOSIS — R61 Generalized hyperhidrosis: Secondary | ICD-10-CM | POA: Diagnosis not present

## 2015-06-26 DIAGNOSIS — Z7951 Long term (current) use of inhaled steroids: Secondary | ICD-10-CM | POA: Diagnosis not present

## 2015-06-26 DIAGNOSIS — I10 Essential (primary) hypertension: Secondary | ICD-10-CM | POA: Insufficient documentation

## 2015-06-26 DIAGNOSIS — K219 Gastro-esophageal reflux disease without esophagitis: Secondary | ICD-10-CM | POA: Diagnosis not present

## 2015-06-26 DIAGNOSIS — R079 Chest pain, unspecified: Secondary | ICD-10-CM | POA: Diagnosis present

## 2015-06-26 DIAGNOSIS — J45909 Unspecified asthma, uncomplicated: Secondary | ICD-10-CM | POA: Diagnosis not present

## 2015-06-26 LAB — BASIC METABOLIC PANEL
Anion gap: 7 (ref 5–15)
BUN: 8 mg/dL (ref 6–20)
CO2: 28 mmol/L (ref 22–32)
Calcium: 9.5 mg/dL (ref 8.9–10.3)
Chloride: 109 mmol/L (ref 101–111)
Creatinine, Ser: 0.68 mg/dL (ref 0.44–1.00)
GFR calc Af Amer: >60 mL/min (ref >60)
GFR calc non Af Amer: >60 mL/min (ref >60)
Glucose, Bld: 77 mg/dL (ref 65–99)
Potassium: 3.4 mmol/L — ABNORMAL LOW (ref 3.5–5.1)
Sodium: 144 mmol/L (ref 135–145)

## 2015-06-26 LAB — I-STAT TROPONIN, ED
Troponin i, poc: 0 ng/mL (ref 0.00–0.08)
Troponin i, poc: 0 ng/mL (ref 0.00–0.08)

## 2015-06-26 LAB — CBC
HCT: 41.1 % (ref 36.0–46.0)
Hemoglobin: 13.1 g/dL (ref 12.0–15.0)
MCH: 26.9 pg (ref 26.0–34.0)
MCHC: 31.9 g/dL (ref 30.0–36.0)
MCV: 84.4 fL (ref 78.0–100.0)
Platelets: 297 10*3/uL (ref 150–400)
RBC: 4.87 MIL/uL (ref 3.87–5.11)
RDW: 13.5 % (ref 11.5–15.5)
WBC: 6.3 10*3/uL (ref 4.0–10.5)

## 2015-06-26 MED ORDER — GI COCKTAIL ~~LOC~~
30.0000 mL | Freq: Once | ORAL | Status: AC
  Administered 2015-06-26: 30 mL via ORAL
  Filled 2015-06-26: qty 30

## 2015-06-26 MED ORDER — IBUPROFEN 800 MG PO TABS
800.0000 mg | ORAL_TABLET | Freq: Once | ORAL | Status: AC
  Administered 2015-06-26: 800 mg via ORAL
  Filled 2015-06-26: qty 1

## 2015-06-26 MED ORDER — IBUPROFEN 800 MG PO TABS: 800.0000 mg | ORAL_TABLET | Freq: Four times a day (QID) | ORAL | Status: DC | PRN

## 2015-06-26 MED ORDER — ASPIRIN 81 MG PO CHEW
324.0000 mg | CHEWABLE_TABLET | Freq: Once | ORAL | Status: AC
  Administered 2015-06-26: 324 mg via ORAL
  Filled 2015-06-26: qty 4

## 2015-06-26 NOTE — ED Provider Notes (Signed)
CSN: [id number]     Arrival date & time 06/26/15  5284 History   First MD Initiated Contact with Patient 06/26/15 [phone]     Chief Complaint  Patient presents with  . Chest Pain   Patient is a 49 y.o. female presenting with general illness. The history is provided by the patient. No language interpreter was used.  Illness Location:  Chest Quality:  Pain Severity:  Moderate Onset quality:  Gradual Timing:  Constant Progression:  Unchanged Chronicity:  New Context:  PMHx of HTN, GERD, and OA presenting with CP. Onset one month ago. Constant in nature. Precordial. Nonradiating. Denies shortness of breath or nausea but notes intermittent diaphoresis. No previous history of similar symptoms. Of note patient started new exercise routine prior to onset of chest pain. Denies tobacco use or family history of early MI. Associated symptoms: chest pain   Associated symptoms: no abdominal pain, no congestion, no cough, no fever, no headaches, no loss of consciousness, no nausea, no shortness of breath and no vomiting     Past Medical History  Diagnosis Date  . Hypertension   . Asthma   . Gastric reflux   . Syncope and collapse yrs ago  . Osteoarthritis   . Sleep apnea     cpap machine has not used since 2014 due to machine/tubing in bad shape   . History of blood transfusion yrs agio, none recent   Past Surgical History  Procedure Laterality Date  . Cholecystectomy    . Vaginal hysterectomy  2001    Adnexa left in place  . Cardiac catheterization      more than 5 years ago  . Cardiac catheterization  06-07-2012    [facility]: Normal coronary arteries. No significant LVOT/aortic valve gradient  . Colonoscopy with propofol N/A 02/27/2014    Procedure: COLONOSCOPY WITH PROPOFOL;  Surgeon: Charolett Bumpers, MD;  Location: [facility];  Service: Endoscopy;  Laterality: N/A;  . Esophagogastroduodenoscopy (egd) with propofol N/A 02/27/2014    Procedure: ESOPHAGOGASTRODUODENOSCOPY (EGD) WITH PROPOFOL;   Surgeon: Charolett Bumpers, MD;  Location: [facility];  Service: Endoscopy;  Laterality: N/A;  . Exploratory laparotomy  09/2001    ELAP, LOA, R partial oophorectomy, repair of bowel injury   No family history on file. Social History  Substance Use Topics  . Smoking status: Never Smoker   . Smokeless tobacco: Never Used  . Alcohol Use: No   OB History    Gravida Para Term Preterm AB TAB SAB Ectopic Multiple Living   2 2 2       2       Review of Systems  Constitutional: Positive for diaphoresis. Negative for fever and chills.  HENT: Negative for congestion.   Respiratory: Negative for cough and shortness of breath.   Cardiovascular: Positive for chest pain. Negative for palpitations and leg swelling.  Gastrointestinal: Negative for nausea, vomiting and abdominal pain.  Neurological: Negative for loss of consciousness and headaches.  All other systems reviewed and are negative.   Allergies  No known allergies  Home Medications   Prior to Admission medications   Medication Sig Start Date End Date Taking? Authorizing Provider  albuterol (PROVENTIL HFA;VENTOLIN HFA) 108 (90 BASE) MCG/ACT inhaler Inhale 2 puffs into the lungs 2 (two) times daily.   Yes Historical Provider, MD  atenolol (TENORMIN) 25 MG tablet Take 1 tablet (25 mg total) by mouth every morning. 03/05/15  Yes Doris Cheadle, MD  budesonide-formoterol (SYMBICORT) 160-4.5 MCG/ACT inhaler Inhale 2 puffs into the  lungs 2 (two) times daily.   Yes Historical Provider, MD  baclofen (LIORESAL) 10 MG tablet Take 1 tablet (10 mg total) by mouth 3 (three) times daily. Patient not taking: Reported on 06/26/2015 11/06/14   Doris Cheadle, MD  ciprofloxacin (CIPRO) 500 MG tablet Take 1 tablet (500 mg total) by mouth 2 (two) times daily. Patient not taking: Reported on 06/26/2015 10/20/14   Doris Cheadle, MD  cyclobenzaprine (FLEXERIL) 5 MG tablet Take 1 tablet (5 mg total) by mouth at bedtime. Patient not taking: Reported on 06/26/2015  10/20/14   Doris Cheadle, MD  HYDROCODONE-ACETAMINOPHEN PO Take by mouth.    Historical Provider, MD  ibuprofen (ADVIL,MOTRIN) 800 MG tablet Take 1 tablet (800 mg total) by mouth every 6 (six) hours as needed for moderate pain. 06/26/15   Angelina Ok, MD  meclizine (ANTIVERT) 25 MG tablet Take 1 tablet (25 mg total) by mouth 3 (three) times daily as needed for nausea. Patient not taking: Reported on 06/26/2015 05/15/14   Doris Cheadle, MD  naproxen (NAPROSYN) 500 MG tablet Take 1 tablet (500 mg total) by mouth 2 (two) times daily with a meal. Patient not taking: Reported on 06/26/2015 03/05/15   Doris Cheadle, MD  omeprazole (PRILOSEC) 40 MG capsule Take 1 capsule (40 mg total) by mouth daily. Patient not taking: Reported on 06/26/2015 09/01/14   Doris Cheadle, MD  Sulfamethoxazole-Trimethoprim (SULFAMETHOXAZOLE-TMP DS PO) Take by mouth.    Historical Provider, MD  traMADol (ULTRAM) 50 MG tablet Take 1 tablet (50 mg total) by mouth daily as needed for moderate pain. Patient not taking: Reported on 06/26/2015 03/05/15   Doris Cheadle, MD   BP 105/70 mmHg  Pulse 54  Temp(Src) 98 F (36.7 C) (Oral)  Resp 21  Ht  (1.626 m)  Wt 202 lb (91.627 kg)  BMI 34.66 kg/m2  SpO2 100%   Physical Exam  Constitutional: She is oriented to person, place, and time. No distress.  middle-aged overweight female lying in stretcher in no acute distress  HENT:  Head: Normocephalic and atraumatic.  Eyes: Conjunctivae are normal. Pupils are equal, round, and reactive to light.  Neck: Normal range of motion. Neck supple.  Cardiovascular: Normal rate, regular rhythm and intact distal pulses.   Pulmonary/Chest: Effort normal and breath sounds normal. No respiratory distress. She has no wheezes. She exhibits tenderness.  TTP over precordium especially over costochondral junction.  Abdominal: Soft. Bowel sounds are normal. She exhibits no distension. There is no tenderness.  Musculoskeletal: Normal range of motion.   Neurological: She is alert and oriented to person, place, and time.  Skin: Skin is warm and dry. She is not diaphoretic.     ED Course  Procedures   Labs Review Labs Reviewed  BASIC METABOLIC PANEL - Abnormal; Notable for the following:    Potassium 3.4 (*)    All other components within normal limits  CBC  I-STAT TROPOININ, ED  Rosezena Sensor, ED   Imaging Review Dg Chest 2 View  06/26/2015   CLINICAL DATA:  Sternal and LEFT upper side chest pain for 3 months.  EXAM: CHEST  2 VIEW  COMPARISON:  07/21/2014.  FINDINGS: The heart size and mediastinal contours are within normal limits. Both lungs are clear. The visualized skeletal structures are unremarkable.  IMPRESSION: No active cardiopulmonary disease.  Stable appearance from priors.   Electronically Signed   By: Elsie Stain M.D.   On: 06/26/2015 10:45   I have personally reviewed and evaluated these images and lab  results as part of my medical decision-making.   EKG Interpretation   Date/Time:  Friday June 26 2015 09:49:53 EDT Ventricular Rate:  60 PR Interval:  182 QRS Duration: 99 QT Interval:  421 QTC Calculation: 421 R Axis:   -8 Text Interpretation:  Sinus rhythm Low voltage, precordial leads since  last tracing no significant change Confirmed by BELFI  MD, MELANIE ([id number])  on 06/26/2015 9:52:35 AM      MDM  Ms. Riner is a female w/ PMHx of HTN, GERD, and OA presenting with CP. Onset one month ago. Constant in nature. Exacerbated by lying down and postprandially. Not exertional. Precordial. Nonradiating. Denies shortness of breath or nausea but notes intermittent diaphoresis. No previous history of similar symptoms. Of note patient started new exercise routine prior to onset of chest painWor. Denies tobacco use or family history of early MI.  Exam above notable for middle-aged overweight female lying in stretcher in no acute distress. Afebrile. Heart rate 60s. Normotensive. Not tachypneic. Breathing well  on room air maintaining saturations without submental oxygen. Abdomen benign. TTP over precordium especially over costochondral junction.  Suspect muscular skeletal pain vs gastritis/GERD. Patient at low risk for ACS when risk stratified with HEART score. ASA 324 mg given. EKG showing no ST elevation/depression and isolated T-wave inversion in lead III but is unchanged from one year ago. Delta troponins undetectable. CBC unremarkable. Pain improved with anti-inflammatory. Symptoms likely related to muscular skeletal chest wall pain versus costochondritis given recent change in exercise routine.  Pt discharged home in stable condition and will follow up with her PCP for further risk stratification with outpatient stress test. Strict ED return precautions dicussed. Pt understands and agrees with the plan and has no further questions or concerns.   Pt care discussed with and followed by my attending, Dr. Rolan Bucco  Angelina Ok, MD Pager [phone]   Final diagnoses:  Precordial chest pain    Angelina Ok, MD 06/26/15 1506  Rolan Bucco, MD 06/26/15 1624

## 2015-06-26 NOTE — ED Notes (Signed)
Patient given a warm blanket. 

## 2015-06-26 NOTE — ED Notes (Signed)
Patient being transport to Radiology Department at this time

## 2015-06-26 NOTE — ED Notes (Signed)
Pt states chest pain x 3 months that became noticeably worse tomorrow.  Accompanied by dizziness and sob, even at rest.

## 2015-06-26 NOTE — Discharge Instructions (Signed)
Chest Wall Pain °Chest wall pain is pain felt in or around the chest bones and muscles. It may take up to 6 weeks to get better. It may take longer if you are active. Chest wall pain can happen on its own. Other times, things like germs, injury, coughing, or exercise can cause the pain. °HOME CARE  °· Avoid activities that make you tired or cause pain. Try not to use your chest, belly (abdominal), or side muscles. Do not use heavy weights. °· Put ice on the sore area. °¨ Put ice in a plastic bag. °¨ Place a towel between your skin and the bag. °¨ Leave the ice on for 15-20 minutes for the first 2 days. °· Only take medicine as told by your doctor. °GET HELP RIGHT AWAY IF:  °· You have more pain or are very uncomfortable. °· You have a fever. °· Your chest pain gets worse. °· You have new problems. °· You feel sick to your stomach (nauseous) or throw up (vomit). °· You start to sweat or feel lightheaded. °· You have a cough with mucus (phlegm). °· You cough up blood. °MAKE SURE YOU:  °· Understand these instructions. °· Will watch your condition. °· Will get help right away if you are not doing well or get worse. °Document Released: 03/14/2008 Document Revised: 12/19/2011 Document Reviewed: 05/23/2011 °[employer]® Patient Information ©2015 [employer]. This information is not intended to replace advice given to you by your health care provider. Make sure you discuss any questions you have with your health care provider. ° °

## 2015-07-12 ENCOUNTER — Other Ambulatory Visit: Payer: Self-pay | Admitting: Internal Medicine

## 2015-07-15 NOTE — Telephone Encounter (Signed)
Patient called requesting a med refill on atenolol (TENORMIN) 25 MG tablet. Patient would like her RX to be sent to [facility] on [address]. Please f/u with pt.

## 2015-07-23 ENCOUNTER — Encounter: Payer: Self-pay | Admitting: Internal Medicine

## 2015-07-23 ENCOUNTER — Encounter (HOSPITAL_BASED_OUTPATIENT_CLINIC_OR_DEPARTMENT_OTHER): Payer: Medicaid Other | Admitting: Clinical

## 2015-07-23 ENCOUNTER — Ambulatory Visit: Payer: Medicaid Other | Attending: Internal Medicine | Admitting: Internal Medicine

## 2015-07-23 VITALS — BP 126/83 | HR 63 | Temp 98.0°F | Resp 16 | Ht 64.0 in | Wt 212.6 lb

## 2015-07-23 DIAGNOSIS — I10 Essential (primary) hypertension: Secondary | ICD-10-CM | POA: Diagnosis not present

## 2015-07-23 DIAGNOSIS — Z760 Encounter for issue of repeat prescription: Secondary | ICD-10-CM | POA: Diagnosis present

## 2015-07-23 DIAGNOSIS — M545 Low back pain, unspecified: Secondary | ICD-10-CM

## 2015-07-23 DIAGNOSIS — K219 Gastro-esophageal reflux disease without esophagitis: Secondary | ICD-10-CM | POA: Insufficient documentation

## 2015-07-23 DIAGNOSIS — F32A Depression, unspecified: Secondary | ICD-10-CM

## 2015-07-23 DIAGNOSIS — F341 Dysthymic disorder: Secondary | ICD-10-CM

## 2015-07-23 DIAGNOSIS — G4730 Sleep apnea, unspecified: Secondary | ICD-10-CM | POA: Diagnosis not present

## 2015-07-23 DIAGNOSIS — F320 Major depressive disorder, single episode, mild: Secondary | ICD-10-CM

## 2015-07-23 DIAGNOSIS — G8929 Other chronic pain: Secondary | ICD-10-CM

## 2015-07-23 DIAGNOSIS — Z23 Encounter for immunization: Secondary | ICD-10-CM | POA: Diagnosis not present

## 2015-07-23 DIAGNOSIS — F329 Major depressive disorder, single episode, unspecified: Secondary | ICD-10-CM

## 2015-07-23 DIAGNOSIS — R4589 Other symptoms and signs involving emotional state: Secondary | ICD-10-CM

## 2015-07-23 DIAGNOSIS — Z79899 Other long term (current) drug therapy: Secondary | ICD-10-CM | POA: Diagnosis not present

## 2015-07-23 DIAGNOSIS — J45909 Unspecified asthma, uncomplicated: Secondary | ICD-10-CM | POA: Insufficient documentation

## 2015-07-23 MED ORDER — NAPROXEN 500 MG PO TABS: 500.0000 mg | ORAL_TABLET | Freq: Two times a day (BID) | ORAL | Status: DC

## 2015-07-23 MED ORDER — ATENOLOL 25 MG PO TABS: 25.0000 mg | ORAL_TABLET | Freq: Every morning | ORAL | Status: DC

## 2015-07-23 NOTE — Progress Notes (Signed)
Patient ID: Rayme Bui, female   DOB: 08/06/1966, 49 y.o.   MRN: [id number]  CC: medication refill, flu shot  HPI: Cassandria Drew is a 49 y.o. female here today for a follow up visit.  Patient has past medical history of HTN, Asthma, GERD, sleep apnea, and osteoarthritis. Patient presents today for refills of her medications and some mood changes. Patient reports that she gets mad and depressed over small things. She has no history of depression. She notes that she only gets sad around the holidays because she has very little family. She denies SI. She states that she exercises daily and goes to a weekly group therapy to help with sadness. She states that since attending group therapy she feels much better. She also likes to go on walks, do puzzles, and talk to a friend when she feels sad.  Patient is up to date on pap and mammogram.   Patient has No headache, No chest pain, No abdominal pain - No Nausea, No new weakness tingling or numbness, No Cough - SOB.  Allergies  Allergen Reactions  . No Known Allergies    Past Medical History  Diagnosis Date  . Hypertension   . Asthma   . Gastric reflux   . Syncope and collapse yrs ago  . Osteoarthritis   . Sleep apnea     cpap machine has not used since 2014 due to machine/tubing in bad shape   . History of blood transfusion yrs agio, none recent   Current Outpatient Prescriptions on File Prior to Visit  Medication Sig Dispense Refill  . albuterol (PROVENTIL HFA;VENTOLIN HFA) 108 (90 BASE) MCG/ACT inhaler Inhale 2 puffs into the lungs 2 (two) times daily.    Marland Kitchen atenolol (TENORMIN) 25 MG tablet Take 1 tablet (25 mg total) by mouth every morning. 30 tablet 3  . budesonide-formoterol (SYMBICORT) 160-4.5 MCG/ACT inhaler Inhale 2 puffs into the lungs 2 (two) times daily.    . cyclobenzaprine (FLEXERIL) 5 MG tablet Take 1 tablet (5 mg total) by mouth at bedtime. 30 tablet 0  . ibuprofen (ADVIL,MOTRIN) 800 MG tablet Take 1 tablet (800 mg total) by  mouth every 6 (six) hours as needed for moderate pain. 30 tablet 0  . meclizine (ANTIVERT) 25 MG tablet Take 1 tablet (25 mg total) by mouth 3 (three) times daily as needed for nausea. 30 tablet 1  . naproxen (NAPROSYN) 500 MG tablet Take 1 tablet (500 mg total) by mouth 2 (two) times daily with a meal. 60 tablet 3  . omeprazole (PRILOSEC) 40 MG capsule Take 1 capsule (40 mg total) by mouth daily. 30 capsule 2  . traMADol (ULTRAM) 50 MG tablet Take 1 tablet (50 mg total) by mouth daily as needed for moderate pain. 30 tablet 0  . atenolol (TENORMIN) 25 MG tablet TAKE 1 TABLET (25 MG TOTAL) BY MOUTH EVERY MORNING. 30 tablet 3  . baclofen (LIORESAL) 10 MG tablet Take 1 tablet (10 mg total) by mouth 3 (three) times daily. (Patient not taking: Reported on 06/26/2015) 30 each 0  . ciprofloxacin (CIPRO) 500 MG tablet Take 1 tablet (500 mg total) by mouth 2 (two) times daily. (Patient not taking: Reported on 06/26/2015) 10 tablet 0  . HYDROCODONE-ACETAMINOPHEN PO Take by mouth.    . Sulfamethoxazole-Trimethoprim (SULFAMETHOXAZOLE-TMP DS PO) Take by mouth.     No current facility-administered medications on file prior to visit.   History reviewed. No pertinent family history. Social History   Social History  . Marital Status: Single  Spouse Name: N/A  . Number of Children: N/A  . Years of Education: N/A   Occupational History  . Not on file.   Social History Main Topics  . Smoking status: Never Smoker   . Smokeless tobacco: Never Used  . Alcohol Use: No  . Drug Use: No  . Sexual Activity: No   Other Topics Concern  . Not on file   Social History Narrative    Review of Systems: Other than what is stated in HPI, all other systems are negative.   Objective:   Filed Vitals:   07/23/15 1451  BP: 126/83  Pulse: 63  Temp: 98 F (36.7 C)  Resp: 16    Physical Exam  Neck: No JVD present.  Cardiovascular: Normal rate, regular rhythm and normal heart sounds.   Pulmonary/Chest:  Effort normal and breath sounds normal.  Musculoskeletal: She exhibits no edema or tenderness.  Skin: Skin is warm and dry.  Psychiatric: Judgment and thought content normal.  sad     Lab Results  Component Value Date   WBC 6.3 06/26/2015   HGB 13.1 06/26/2015   HCT 41.1 06/26/2015   MCV 84.4 06/26/2015   PLT 297 06/26/2015   Lab Results  Component Value Date   CREATININE 0.68 06/26/2015   BUN 8 06/26/2015   NA 144 06/26/2015   K 3.4* 06/26/2015   CL 109 06/26/2015   CO2 28 06/26/2015    Lab Results  Component Value Date   HGBA1C 5.5 12/10/2013   Lipid Panel     Component Value Date/Time   CHOL 161 12/10/2013 1032   CHOL 167 03/18/2012 0522   TRIG 69 12/10/2013 1032   TRIG 58 03/18/2012 0522   HDL 39* 12/10/2013 1032   HDL 60 03/18/2012 0522   CHOLHDL 4.1 12/10/2013 1032   VLDL 14 12/10/2013 1032   VLDL 12 03/18/2012 0522   LDLCALC 108* 12/10/2013 1032   LDLCALC 95 03/18/2012 0522       Assessment and plan:   Tamela OddiBetsy was seen today for follow-up.  Diagnoses and all orders for this visit:  Essential hypertension -    Refill atenolol (TENORMIN) 25 MG tablet; Take 1 tablet (25 mg total) by mouth every morning. Patient blood pressure is stable and may continue on current medication.  Education on diet, exercise, and modifiable risk factors discussed. Will obtain appropriate labs as needed. Will follow up in 3-6 months. ;  Sad mood [facility] PHQ-9 Score was 10. I do not feel patient needs medication at this time.  Chronic low back pain -     Refilled naproxen (NAPROSYN) 500 MG tablet; Take 1 tablet (500 mg total) by mouth 2 (two) times daily with a meal stable  Need for influenza vaccination -     Flu Vaccine QUAD 36+ mos PF IM (Fluarix & Fluzone Quad PF)   Return in about 6 months (around 01/21/2016) for Hypertension.        Ambrose FinlandValerie A Jewelianna Pancoast, NP-C [facility] [phone] 07/23/2015, 2:54 PM

## 2015-07-23 NOTE — Patient Instructions (Signed)
Continue counseling. If you notice that you are feeling more depressed, hopeless, or crying spells then call me back and we will talk some more.

## 2015-07-23 NOTE — Progress Notes (Signed)
ASSESSMENT: Pt currently experiencing symptoms of mild depression and would benefit from psychoeducation and supportive counseling regarding coping with symptoms of depression.  Stage of Change: contemplative  PLAN: 1. F/U with behavioral health consultant in as needed 2. Psychiatric Medications: none. 3. Behavioral recommendation(s):   -Continue to attend [employer]  -Consider reading educational material regarding coping with symptoms of depression SUBJECTIVE: Pt. referred by [name] for mild symptoms of depression:  Pt. reports the following symptoms/concerns: Pt says that she sometimes feels down, but that going to to [employer] classes helps her to feel better.  Duration of problem: undetermined number of years Severity: mild  OBJECTIVE: Orientation & Cognition: Oriented x3. Thought processes normal and appropriate to situation. Mood: appropriate. Affect: appropriate Appearance: appropriate Risk of harm to self or others: no risk of harm to self or others Substance use: none Assessments administered: PHQ9: 10  Diagnosis: Mild depression CPT Code: F32.9 -------------------------------------------- Other(s) present in the room: none  Time spent with patient in exam room: 16 minutes

## 2015-07-23 NOTE — Progress Notes (Signed)
Patient here for follow up and to meet her new provider Patient is requesting a refill on her atenolol and naproxen

## 2015-10-21 ENCOUNTER — Ambulatory Visit: Payer: Medicaid Other | Admitting: Internal Medicine

## 2015-10-29 ENCOUNTER — Ambulatory Visit: Payer: Medicaid Other | Attending: Internal Medicine | Admitting: Internal Medicine

## 2015-10-29 ENCOUNTER — Encounter: Payer: Self-pay | Admitting: Internal Medicine

## 2015-10-29 VITALS — BP 143/84 | HR 71 | Temp 98.0°F | Resp 16 | Wt 225.4 lb

## 2015-10-29 DIAGNOSIS — I10 Essential (primary) hypertension: Secondary | ICD-10-CM | POA: Diagnosis not present

## 2015-10-29 DIAGNOSIS — Z79899 Other long term (current) drug therapy: Secondary | ICD-10-CM | POA: Diagnosis not present

## 2015-10-29 DIAGNOSIS — J45909 Unspecified asthma, uncomplicated: Secondary | ICD-10-CM | POA: Diagnosis not present

## 2015-10-29 DIAGNOSIS — N3000 Acute cystitis without hematuria: Secondary | ICD-10-CM | POA: Insufficient documentation

## 2015-10-29 DIAGNOSIS — R0789 Other chest pain: Secondary | ICD-10-CM | POA: Diagnosis not present

## 2015-10-29 DIAGNOSIS — R300 Dysuria: Secondary | ICD-10-CM | POA: Insufficient documentation

## 2015-10-29 DIAGNOSIS — K219 Gastro-esophageal reflux disease without esophagitis: Secondary | ICD-10-CM | POA: Diagnosis not present

## 2015-10-29 DIAGNOSIS — R109 Unspecified abdominal pain: Secondary | ICD-10-CM | POA: Diagnosis not present

## 2015-10-29 LAB — POCT URINALYSIS DIPSTICK
Bilirubin, UA: NEGATIVE
Blood, UA: NEGATIVE
Glucose, UA: NEGATIVE
Ketones, UA: NEGATIVE
Nitrite, UA: NEGATIVE
Protein, UA: NEGATIVE
Spec Grav, UA: 1.015
Urobilinogen, UA: 0.2
pH, UA: 7.5

## 2015-10-29 MED ORDER — CIPROFLOXACIN HCL 500 MG PO TABS: 500.0000 mg | ORAL_TABLET | Freq: Two times a day (BID) | ORAL | Status: DC

## 2015-10-29 MED ORDER — RANITIDINE HCL 150 MG PO TABS: 150.0000 mg | ORAL_TABLET | Freq: Two times a day (BID) | ORAL | Status: DC

## 2015-10-29 MED FILL — CIPROFLOXACIN HCL 500 MG TA: 500 | 3 days supply | Qty: 6 | Fill #0

## 2015-10-29 MED FILL — raNITIdine HCL 150 MG TABS: 150 | 30 days supply | Qty: 60 | Fill #0

## 2015-10-29 NOTE — Progress Notes (Signed)
Patient complains of burning when she voids and flank pain for the past two weeks Patient also complains of having some pain to her left ear

## 2015-10-29 NOTE — Progress Notes (Signed)
Patient ID: [name], female   DOB: 1966-01-31, 50 y.o.   MRN: [id number]  CC: dysuria, chest discomfort  HPI: [name] is a 50 y.o. female here today for a follow up visit.  Patient has past medical history of HTN, asthma, sleep apnea, and acid reflux. Patient reports that she has been having smelly urine and foul odor. Some left flank pain. She denies hematuria, fever, chills, nausea, vomiting.  Chest pain for the past 2 weeks. Chest feels tight or like something is punching her. Has tried TUMS because she thought it was her acid reflux since she has been out of medication. Some SOB and bloating. No associated with exertions. Nothing relieves the pain. No palpitations.   Allergies  Allergen Reactions  . No Known Allergies    Past Medical History  Diagnosis Date  . Hypertension   . Asthma   . Gastric reflux   . Syncope and collapse yrs ago  . Osteoarthritis   . Sleep apnea     cpap machine has not used since 2014 due to machine/tubing in bad shape   . History of blood transfusion yrs agio, none recent   Current Outpatient Prescriptions on File Prior to Visit  Medication Sig Dispense Refill  . albuterol (PROVENTIL HFA;VENTOLIN HFA) 108 (90 BASE) MCG/ACT inhaler Inhale 2 puffs into the lungs 2 (two) times daily.    Marland Kitchen atenolol (TENORMIN) 25 MG tablet Take 1 tablet (25 mg total) by mouth every morning. 30 tablet 3  . ibuprofen (ADVIL,MOTRIN) 800 MG tablet Take 1 tablet (800 mg total) by mouth every 6 (six) hours as needed for moderate pain. 30 tablet 0  . naproxen (NAPROSYN) 500 MG tablet Take 1 tablet (500 mg total) by mouth 2 (two) times daily with a meal. 60 tablet 3  . baclofen (LIORESAL) 10 MG tablet Take 1 tablet (10 mg total) by mouth 3 (three) times daily. (Patient not taking: Reported on 06/26/2015) 30 each 0  . budesonide-formoterol (SYMBICORT) 160-4.5 MCG/ACT inhaler Inhale 2 puffs into the lungs 2 (two) times daily.    Marland Kitchen HYDROCODONE-ACETAMINOPHEN PO Take by mouth.     . meclizine (ANTIVERT) 25 MG tablet Take 1 tablet (25 mg total) by mouth 3 (three) times daily as needed for nausea. (Patient not taking: Reported on 10/29/2015) 30 tablet 1  . omeprazole (PRILOSEC) 40 MG capsule Take 1 capsule (40 mg total) by mouth daily. 30 capsule 2  . Sulfamethoxazole-Trimethoprim (SULFAMETHOXAZOLE-TMP DS PO) Take by mouth.    . traMADol (ULTRAM) 50 MG tablet Take 1 tablet (50 mg total) by mouth daily as needed for moderate pain. 30 tablet 0   No current facility-administered medications on file prior to visit.   No family history on file. Social History   Social History  . Marital Status: Single    Spouse Name: N/A  . Number of Children: N/A  . Years of Education: N/A   Occupational History  . Not on file.   Social History Main Topics  . Smoking status: Never Smoker   . Smokeless tobacco: Never Used  . Alcohol Use: No  . Drug Use: No  . Sexual Activity: No   Other Topics Concern  . Not on file   Social History Narrative    Review of Systems: Other than what is stated in HPI, all other systems are negative.   Objective:   Filed Vitals:   10/29/15 1447  BP: 143/84  Pulse: 71  Temp: 98 F (36.7 C)  Resp: 16  Physical Exam  Constitutional: She is oriented to person, place, and time.  Cardiovascular: Normal rate, regular rhythm and normal heart sounds.   No murmur heard. Pulmonary/Chest: Effort normal and breath sounds normal. She has no wheezes. She exhibits no tenderness.  Abdominal: Soft. Bowel sounds are normal. There is no tenderness.  NO CVA tenderness  Musculoskeletal: She exhibits no edema.  Neurological: She is alert and oriented to person, place, and time.  Psychiatric: She has a normal mood and affect.     Lab Results  Component Value Date   WBC 6.3 06/26/2015   HGB 13.1 06/26/2015   HCT 41.1 06/26/2015   MCV 84.4 06/26/2015   PLT 297 06/26/2015   Lab Results  Component Value Date   CREATININE 0.68 06/26/2015   BUN  8 06/26/2015   NA 144 06/26/2015   K 3.4* 06/26/2015   CL 109 06/26/2015   CO2 28 06/26/2015    Lab Results  Component Value Date   HGBA1C 5.5 12/10/2013   Lipid Panel     Component Value Date/Time   CHOL 161 12/10/2013 1032   CHOL 167 03/18/2012 0522   TRIG 69 12/10/2013 1032   TRIG 58 03/18/2012 0522   HDL 39* 12/10/2013 1032   HDL 60 03/18/2012 0522   CHOLHDL 4.1 12/10/2013 1032   VLDL 14 12/10/2013 1032   VLDL 12 03/18/2012 0522   LDLCALC 108* 12/10/2013 1032   LDLCALC 95 03/18/2012 0522       Assessment and plan:   [name] was seen today for flank pain.  Diagnoses and all orders for this visit:  Chest discomfort -     Urine culture -     EKG 12-Lead -     ranitidine (ZANTAC) 150 MG tablet; Take 1 tablet (150 mg total) by mouth 2 (two) times daily. EKG: normal EKG, normal sinus rhythm. Patient even GI cocktail in office which she had significant improvement in pain.   Acute cystitis without hematuria -    Begin ciprofloxacin (CIPRO) 500 MG tablet; Take 1 tablet (500 mg total) by mouth 2 (two) times daily. Return precautions addressed  Flank pain -     Urinalysis Dipstick Patient reports a mild ache which i do not feel is related to cystitis. She may take ibuprofen for pain.   Gastroesophageal reflux disease, esophagitis presence not specified -    Begin ranitidine (ZANTAC) 150 MG tablet; Take 1 tablet (150 mg total) by mouth 2 (two) times daily.  Return if symptoms worsen or fail to improve.        Jenna Finland, NP-C [facility] [phone] 10/29/2015, 3:04 PM

## 2015-10-30 LAB — URINE CULTURE
Colony Count: NO GROWTH
Organism ID, Bacteria: NO GROWTH

## 2015-10-30 MED ORDER — GI COCKTAIL ~~LOC~~: 30.0000 mL | Freq: Once | ORAL | Status: DC

## 2015-11-04 ENCOUNTER — Telehealth: Payer: Self-pay

## 2015-11-04 NOTE — Telephone Encounter (Signed)
-----   Message from Ambrose Finland, NP sent at 11/02/2015  7:17 PM EST ----- Urine culture normal

## 2015-11-04 NOTE — Telephone Encounter (Signed)
Spoke with patient and she is aware her urine culture Came back normal

## 2015-11-09 ENCOUNTER — Encounter (HOSPITAL_COMMUNITY): Payer: Self-pay | Admitting: *Deleted

## 2015-11-09 ENCOUNTER — Emergency Department (HOSPITAL_COMMUNITY)
Admission: EM | Admit: 2015-11-09 | Discharge: 2015-11-09 | Disposition: A | Discharge status: Home / Self Care | Payer: Medicaid Other | Attending: Emergency Medicine | Admitting: Emergency Medicine

## 2015-11-09 DIAGNOSIS — J45909 Unspecified asthma, uncomplicated: Secondary | ICD-10-CM | POA: Diagnosis not present

## 2015-11-09 DIAGNOSIS — M545 Low back pain, unspecified: Secondary | ICD-10-CM

## 2015-11-09 DIAGNOSIS — Z792 Long term (current) use of antibiotics: Secondary | ICD-10-CM | POA: Insufficient documentation

## 2015-11-09 DIAGNOSIS — Z79899 Other long term (current) drug therapy: Secondary | ICD-10-CM | POA: Diagnosis not present

## 2015-11-09 DIAGNOSIS — K219 Gastro-esophageal reflux disease without esophagitis: Secondary | ICD-10-CM | POA: Diagnosis not present

## 2015-11-09 DIAGNOSIS — I10 Essential (primary) hypertension: Secondary | ICD-10-CM | POA: Diagnosis not present

## 2015-11-09 LAB — URINALYSIS, ROUTINE W REFLEX MICROSCOPIC
Bilirubin Urine: NEGATIVE
Glucose, UA: NEGATIVE mg/dL
Hgb urine dipstick: NEGATIVE
Ketones, ur: NEGATIVE mg/dL
Leukocytes, UA: NEGATIVE
Nitrite: NEGATIVE
Protein, ur: NEGATIVE mg/dL
Specific Gravity, Urine: 1.006 (ref 1.005–1.030)
pH: 6.5 (ref 5.0–8.0)

## 2015-11-09 MED ORDER — ACETAMINOPHEN 500 MG PO TABS
1000.0000 mg | ORAL_TABLET | Freq: Once | ORAL | Status: AC
  Administered 2015-11-09: 1000 mg via ORAL
  Filled 2015-11-09: qty 2

## 2015-11-09 MED ORDER — CYCLOBENZAPRINE HCL 10 MG PO TABS: 5.0000 mg | ORAL_TABLET | Freq: Three times a day (TID) | ORAL | Status: DC | PRN

## 2015-11-09 MED ORDER — CYCLOBENZAPRINE HCL 10 MG PO TABS
5.0000 mg | ORAL_TABLET | Freq: Once | ORAL | Status: AC
  Administered 2015-11-09: 5 mg via ORAL
  Filled 2015-11-09: qty 1

## 2015-11-09 NOTE — ED Provider Notes (Signed)
CSN: [id number]     Arrival date & time 11/09/15  7829 History   First MD Initiated Contact with Patient 11/09/15 [phone]     Chief Complaint  Patient presents with  . Back Pain     (Consider location/radiation/quality/duration/timing/severity/associated sxs/prior Treatment) HPI Complains of right-sided paralumbar pain, nonradiating onset 2 weeks ago, becoming worse last night. She treated herself with Tylenol last night that describes no relief however her pain is significantly better than last night. Pain is worse with changing position improved with remaining still she did admit to burning at urethral meatus with urination last week which has since resolved. She denies fever no loss of bladder bowel control. No other associated symptoms. Past Medical History  Diagnosis Date  . Hypertension   . Asthma   . Gastric reflux   . Syncope and collapse yrs ago  . Osteoarthritis   . Sleep apnea     cpap machine has not used since 2014 due to machine/tubing in bad shape   . History of blood transfusion yrs agio, none recent   Past Surgical History  Procedure Laterality Date  . Cholecystectomy    . Vaginal hysterectomy  2001    Adnexa left in place  . Cardiac catheterization      more than 5 years ago  . Cardiac catheterization  06-07-2012    [facility]: Normal coronary arteries. No significant LVOT/aortic valve gradient  . Colonoscopy with propofol N/A 02/27/2014    Procedure: COLONOSCOPY WITH PROPOFOL;  Surgeon: Charolett Bumpers, MD;  Location: [facility];  Service: Endoscopy;  Laterality: N/A;  . Esophagogastroduodenoscopy (egd) with propofol N/A 02/27/2014    Procedure: ESOPHAGOGASTRODUODENOSCOPY (EGD) WITH PROPOFOL;  Surgeon: Charolett Bumpers, MD;  Location: [facility];  Service: Endoscopy;  Laterality: N/A;  . Exploratory laparotomy  09/2001    ELAP, LOA, R partial oophorectomy, repair of bowel injury   No family history on file. Social History  Substance Use Topics  . Smoking status:  Never Smoker   . Smokeless tobacco: Never Used  . Alcohol Use: No   OB History    Gravida Para Term Preterm AB TAB SAB Ectopic Multiple Living   Review of Systems  Constitutional: Negative.   HENT: Negative.   Respiratory: Negative.   Cardiovascular: Negative.   Gastrointestinal: Negative.   Musculoskeletal: Positive for back pain.  Skin: Negative.   Neurological: Negative.   Psychiatric/Behavioral: Negative.   All other systems reviewed and are negative.     Allergies  No known allergies  Home Medications   Prior to Admission medications   Medication Sig Start Date End Date Taking? Authorizing Provider  albuterol (PROVENTIL HFA;VENTOLIN HFA) 108 (90 BASE) MCG/ACT inhaler Inhale 2 puffs into the lungs 2 (two) times daily.    Historical Provider, MD  atenolol (TENORMIN) 25 MG tablet Take 1 tablet (25 mg total) by mouth every morning. 07/23/15   Ambrose Finland, NP  baclofen (LIORESAL) 10 MG tablet Take 1 tablet (10 mg total) by mouth 3 (three) times daily. Patient not taking: Reported on 06/26/2015 11/06/14   Doris Cheadle, MD  budesonide-formoterol (SYMBICORT) 160-4.5 MCG/ACT inhaler Inhale 2 puffs into the lungs 2 (two) times daily.    Historical Provider, MD  ciprofloxacin (CIPRO) 500 MG tablet Take 1 tablet (500 mg total) by mouth 2 (two) times daily. 10/29/15   Ambrose Finland, NP  HYDROCODONE-ACETAMINOPHEN PO Take by mouth.    Historical  Provider, MD  ibuprofen (ADVIL,MOTRIN) 800 MG tablet Take 1 tablet (800 mg total) by mouth every 6 (six) hours as needed for moderate pain. 06/26/15   Angelina Ok, MD  meclizine (ANTIVERT) 25 MG tablet Take 1 tablet (25 mg total) by mouth 3 (three) times daily as needed for nausea. Patient not taking: Reported on 10/29/2015 05/15/14   Doris Cheadle, MD  naproxen (NAPROSYN) 500 MG tablet Take 1 tablet (500 mg total) by mouth 2 (two) times daily with a meal. 07/23/15   Ambrose Finland, NP  omeprazole (PRILOSEC) 40 MG capsule  Take 1 capsule (40 mg total) by mouth daily. 09/01/14   Doris Cheadle, MD  ranitidine (ZANTAC) 150 MG tablet Take 1 tablet (150 mg total) by mouth 2 (two) times daily. 10/29/15   Ambrose Finland, NP  Sulfamethoxazole-Trimethoprim (SULFAMETHOXAZOLE-TMP DS PO) Take by mouth.    Historical Provider, MD  traMADol (ULTRAM) 50 MG tablet Take 1 tablet (50 mg total) by mouth daily as needed for moderate pain. 03/05/15   Deepak Advani, MD   BP 146/92 mmHg  Pulse 63  Temp(Src) 97.6 F (36.4 C) (Oral)  Resp 16  SpO2 99% Physical Exam  Constitutional: She is oriented to person, place, and time. She appears well-developed and well-nourished.  HENT:  Head: Normocephalic and atraumatic.  Eyes: Conjunctivae are normal. Pupils are equal, round, and reactive to light.  Neck: Neck supple. No tracheal deviation present. No thyromegaly present.  Cardiovascular: Normal rate and regular rhythm.   No murmur heard. Pulmonary/Chest: Effort normal and breath sounds normal.  Abdominal: Soft. Bowel sounds are normal. She exhibits no distension. There is no tenderness.  Musculoskeletal: Normal range of motion. She exhibits no edema or tenderness.  High spine nontender. Mild paralumbar tenderness. Right-sided paralumbar exacerbated when she sits up from a supine position.  Neurological: She is alert and oriented to person, place, and time. Coordination normal.  DTRs symmetric bilaterally at knee jerk and ankle jerk and biceps toes downgoing bilaterally and gait normal  Skin: Skin is warm and dry. No rash noted.  Psychiatric: She has a normal mood and affect.  Nursing note and vitals reviewed.   ED Course  Procedures (including critical care time) Labs Review Labs Reviewed - No data to display  Imaging Review No results found. I have personally reviewed and evaluated these images and lab results as part of my medical decision-making.   EKG Interpretation None     9:40 AM symptoms improved after treatment  with Flexeril and Tylenol. She feels ready go home. Results for orders placed or performed during the hospital encounter of 11/09/15  Urinalysis, Routine w reflex microscopic (not at [facility])  Result Value Ref Range   Color, Urine YELLOW YELLOW   APPearance CLEAR CLEAR   Specific Gravity, Urine 1.006 1.005 - 1.030   pH 6.5 5.0 - 8.0   Glucose, UA NEGATIVE NEGATIVE mg/dL   Hgb urine dipstick NEGATIVE NEGATIVE   Bilirubin Urine NEGATIVE NEGATIVE   Ketones, ur NEGATIVE NEGATIVE mg/dL   Protein, ur NEGATIVE NEGATIVE mg/dL   Nitrite NEGATIVE NEGATIVE   Leukocytes, UA NEGATIVE NEGATIVE   No results found.  MDM  Pain felt to be muscular in etiology Plan prescription Flexeril. Tylenol for pain. Follow-up with [facility] if still symptomatic in a week. Diagnosis low back pain Final diagnoses:  None        Doug Sou, MD 11/09/15 0945

## 2015-11-09 NOTE — ED Notes (Signed)
Pt presents via POV c/o lower back pain x 1 day, denies injury or heavy lifting.  Denies urinary symptoms.  Pt ambulatory on arrival.  A x 4, NAD.

## 2015-11-09 NOTE — Discharge Instructions (Signed)
Back Pain, Adult Take Tylenol as directed for pain along with the medication prescribed for muscle spasm. See your physician at the [facility] if you had significant discomfort in a week. Return if your condition worsens for any reason Back pain is very common in adults.The cause of back pain is rarely dangerous and the pain often gets better over time.The cause of your back pain may not be known. Some common causes of back pain include:  Strain of the muscles or ligaments supporting the spine.  Wear and tear (degeneration) of the spinal disks.  Arthritis.  Direct injury to the back. For many people, back pain may return. Since back pain is rarely dangerous, most people can learn to manage this condition on their own. HOME CARE INSTRUCTIONS Watch your back pain for any changes. The following actions may help to lessen any discomfort you are feeling:  Remain active. It is stressful on your back to sit or stand in one place for long periods of time. Do not sit, drive, or stand in one place for more than 30 minutes at a time. Take short walks on even surfaces as soon as you are able.Try to increase the length of time you walk each day.  Exercise regularly as directed by your health care provider. Exercise helps your back heal faster. It also helps avoid future injury by keeping your muscles strong and flexible.  Do not stay in bed.Resting more than 1-2 days can delay your recovery.  Pay attention to your body when you bend and lift. The most comfortable positions are those that put less stress on your recovering back. Always use proper lifting techniques, including:  Bending your knees.  Keeping the load close to your body.  Avoiding twisting.  Find a comfortable position to sleep. Use a firm mattress and lie on your side with your knees slightly bent. If you lie on your back, put a pillow under your knees.  Avoid feeling anxious or stressed.Stress  increases muscle tension and can worsen back pain.It is important to recognize when you are anxious or stressed and learn ways to manage it, such as with exercise.  Take medicines only as directed by your health care provider. Over-the-counter medicines to reduce pain and inflammation are often the most helpful.Your health care provider may prescribe muscle relaxant drugs.These medicines help dull your pain so you can more quickly return to your normal activities and healthy exercise.  Apply ice to the injured area:  Put ice in a plastic bag.  Place a towel between your skin and the bag.  Leave the ice on for 20 minutes, 2-3 times a day for the first 2-3 days. After that, ice and heat may be alternated to reduce pain and spasms.  Maintain a healthy weight. Excess weight puts extra stress on your back and makes it difficult to maintain good posture. SEEK MEDICAL CARE IF:  You have pain that is not relieved with rest or medicine.  You have increasing pain going down into the legs or buttocks.  You have pain that does not improve in one week.  You have night pain.  You lose weight.  You have a fever or chills. SEEK IMMEDIATE MEDICAL CARE IF:   You develop new bowel or bladder control problems.  You have unusual weakness or numbness in your arms or legs.  You develop nausea or vomiting.  You develop abdominal pain.  You feel faint.   This information is not intended  to replace advice given to you by your health care provider. Make sure you discuss any questions you have with your health care provider.   Document Released: 09/26/2005 Document Revised: 10/17/2014 Document Reviewed: 01/28/2014 Elsevier Interactive Patient Education [employer].

## 2015-12-11 ENCOUNTER — Other Ambulatory Visit: Payer: Self-pay | Admitting: Family Medicine

## 2015-12-11 DIAGNOSIS — I10 Essential (primary) hypertension: Secondary | ICD-10-CM

## 2015-12-11 MED ORDER — ATENOLOL 25 MG PO TABS: 25.0000 mg | ORAL_TABLET | Freq: Every morning | ORAL | Status: DC

## 2016-01-28 MED FILL — raNITIdine HCL 150 MG TABS: 150 | 30 days supply | Qty: 60 | Fill #1

## 2016-02-01 ENCOUNTER — Ambulatory Visit (HOSPITAL_BASED_OUTPATIENT_CLINIC_OR_DEPARTMENT_OTHER): Payer: Medicaid Other | Admitting: Clinical

## 2016-02-01 ENCOUNTER — Ambulatory Visit: Payer: Medicaid Other | Attending: Family Medicine | Admitting: Family Medicine

## 2016-02-01 VITALS — BP 142/78 | HR 68 | Temp 98.2°F | Resp 18 | Ht 64.0 in | Wt 234.0 lb

## 2016-02-01 DIAGNOSIS — I10 Essential (primary) hypertension: Secondary | ICD-10-CM | POA: Diagnosis not present

## 2016-02-01 DIAGNOSIS — G8929 Other chronic pain: Secondary | ICD-10-CM | POA: Diagnosis not present

## 2016-02-01 DIAGNOSIS — K219 Gastro-esophageal reflux disease without esophagitis: Secondary | ICD-10-CM | POA: Diagnosis not present

## 2016-02-01 DIAGNOSIS — M545 Low back pain, unspecified: Secondary | ICD-10-CM

## 2016-02-01 DIAGNOSIS — Z719 Counseling, unspecified: Secondary | ICD-10-CM

## 2016-02-01 DIAGNOSIS — Z79899 Other long term (current) drug therapy: Secondary | ICD-10-CM | POA: Diagnosis not present

## 2016-02-01 MED ORDER — NAPROXEN 500 MG PO TABS: 500.0000 mg | ORAL_TABLET | Freq: Two times a day (BID) | ORAL | Status: DC

## 2016-02-01 MED ORDER — CYCLOBENZAPRINE HCL 10 MG PO TABS: 10.0000 mg | ORAL_TABLET | Freq: Three times a day (TID) | ORAL | Status: DC | PRN

## 2016-02-01 MED ORDER — RANITIDINE HCL 150 MG PO TABS: 150.0000 mg | ORAL_TABLET | Freq: Two times a day (BID) | ORAL | Status: DC

## 2016-02-01 MED FILL — NAPROXEN 500 MG TABLET: 500 | 30 days supply | Qty: 60 | Fill #0

## 2016-02-01 MED FILL — CYCLOBENZAPRINE 10 MG TAB: 10 | 20 days supply | Qty: 60 | Fill #0

## 2016-02-01 NOTE — Progress Notes (Signed)
Patient c/o back pain that started last night. Rate pain 10/10 described as a constant aching pain.  Requesting med refill omeprazole and zantac.

## 2016-02-01 NOTE — Patient Instructions (Signed)

## 2016-02-01 NOTE — Progress Notes (Signed)
Subjective:  Patient ID: Jenna LernerBetsy [name], female    DOB: 05/08/1966  Age: 50 y.o. MRN: [id number]  CC: Back Pain   HPI [name] is a  50 year old female with a history of hypertension, GERD who comes into the clinic complaining of sudden onset left-sided back pain which started last night rated at 10/10 and radiates to her left leg. Pain is worse with change in position and not relieved by any factors. She ran out off all her analgesics and muscle relaxants and has not taken anything over-the-counter for her pain. Denies a history of heavy lifting and has no numbness in lower extremities or loss of sphincteric function.  She is requesting a refill of her PPI.  Outpatient Prescriptions Prior to Visit  Medication Sig Dispense Refill  . albuterol (PROVENTIL HFA;VENTOLIN HFA) 108 (90 BASE) MCG/ACT inhaler Inhale 2 puffs into the lungs 2 (two) times daily.    Marland Kitchen. atenolol (TENORMIN) 25 MG tablet Take 1 tablet (25 mg total) by mouth every morning. 30 tablet 3  . budesonide-formoterol (SYMBICORT) 160-4.5 MCG/ACT inhaler Inhale 2 puffs into the lungs 2 (two) times daily.    Marland Kitchen. omeprazole (PRILOSEC) 40 MG capsule Take 1 capsule (40 mg total) by mouth daily. 30 capsule 2  . traMADol (ULTRAM) 50 MG tablet Take 1 tablet (50 mg total) by mouth daily as needed for moderate pain. 30 tablet 0  . cyclobenzaprine (FLEXERIL) 10 MG tablet Take 0.5 tablets (5 mg total) by mouth 3 (three) times daily as needed for muscle spasms. 10 tablet 0  . ranitidine (ZANTAC) 150 MG tablet Take 1 tablet (150 mg total) by mouth 2 (two) times daily. 60 tablet 3  . HYDROCODONE-ACETAMINOPHEN PO Take by mouth. Reported on 02/01/2016    . meclizine (ANTIVERT) 25 MG tablet Take 1 tablet (25 mg total) by mouth 3 (three) times daily as needed for nausea. (Patient not taking: Reported on 10/29/2015) 30 tablet 1  . baclofen (LIORESAL) 10 MG tablet Take 1 tablet (10 mg total) by mouth 3 (three) times daily. (Patient not taking: Reported  on 06/26/2015) 30 each 0  . ciprofloxacin (CIPRO) 500 MG tablet Take 1 tablet (500 mg total) by mouth 2 (two) times daily. (Patient not taking: Reported on 02/01/2016) 6 tablet 0  . ibuprofen (ADVIL,MOTRIN) 800 MG tablet Take 1 tablet (800 mg total) by mouth every 6 (six) hours as needed for moderate pain. (Patient not taking: Reported on 02/01/2016) 30 tablet 0  . naproxen (NAPROSYN) 500 MG tablet Take 1 tablet (500 mg total) by mouth 2 (two) times daily with a meal. (Patient not taking: Reported on 02/01/2016) 60 tablet 3  . Sulfamethoxazole-Trimethoprim (SULFAMETHOXAZOLE-TMP DS PO) Take by mouth. Reported on 02/01/2016     Facility-Administered Medications Prior to Visit  Medication Dose Route Frequency Provider Last Rate Last Dose  . gi cocktail (Maalox,Lidocaine,Donnatal)  30 mL Oral Once Ambrose FinlandValerie A Keck, NP        ROS Review of Systems  Constitutional: Negative for activity change and appetite change.  HENT: Negative for sinus pressure and sore throat.   Respiratory: Negative for chest tightness, shortness of breath and wheezing.   Cardiovascular: Negative for chest pain and palpitations.  Gastrointestinal: Negative for abdominal pain, constipation and abdominal distention.  Genitourinary: Negative.   Musculoskeletal: Positive for back pain.  Psychiatric/Behavioral: Negative for behavioral problems and dysphoric mood.    Objective:  BP 155/81 mmHg  Pulse 68  Temp(Src) 98.2 F (36.8 C) (Oral)  Resp 18  Ht   (1.626 m)  Wt 234 lb (106.142 kg)  BMI 40.15 kg/m2  SpO2 98%  BP/Weight 02/01/2016 11/09/2015 10/29/2015  Systolic BP 155 101 143  Diastolic BP 81 71 84  Wt. (Lbs) 234 - 225.4  BMI 40.15 - 38.67      Physical Exam  Constitutional: She is oriented to person, place, and time. She appears well-developed and well-nourished.  Cardiovascular: Normal rate and intact distal pulses.   Murmur heard. Pulmonary/Chest: Effort normal and breath sounds normal. She has no wheezes.  She has no rales. She exhibits no tenderness.  Abdominal: Soft. Bowel sounds are normal. She exhibits no distension and no mass. There is no tenderness.  Musculoskeletal: Normal range of motion. She exhibits tenderness (on palpation of left paraspinal muscle; patient unable to lie flat on the exam table).  Neurological: She is alert and oriented to person, place, and time.     Assessment & Plan:   1. Chronic low back pain Acute exacerbation of low back pain with sciatica. Advised to apply heat. Cyclobenzaprine added to her regimen and stretching exercises demonstrated - naproxen (NAPROSYN) 500 MG tablet; Take 1 tablet (500 mg total) by mouth 2 (two) times daily with a meal.  Dispense: 60 tablet; Refill: 1  2. Essential hypertension Current elevation could be secondary to acute pain We'll reassess at next office visit  3. Gastroesophageal reflux disease, esophagitis presence not specified Controlled - ranitidine (ZANTAC) 150 MG tablet; Take 1 tablet (150 mg total) by mouth 2 (two) times daily.  Dispense: 60 tablet; Refill: 3   Meds ordered this encounter  Medications  . naproxen (NAPROSYN) 500 MG tablet    Sig: Take 1 tablet (500 mg total) by mouth 2 (two) times daily with a meal.    Dispense:  60 tablet    Refill:  1  . cyclobenzaprine (FLEXERIL) 10 MG tablet    Sig: Take 1 tablet (10 mg total) by mouth 3 (three) times daily as needed for muscle spasms.    Dispense:  60 tablet    Refill:  1  . ranitidine (ZANTAC) 150 MG tablet    Sig: Take 1 tablet (150 mg total) by mouth 2 (two) times daily.    Dispense:  60 tablet    Refill:  3    Follow-up: Return in about 2 weeks (around 02/15/2016) for follow up on back pain.   Jaclyn Shaggy MD

## 2016-02-01 NOTE — Progress Notes (Signed)
ASSESSMENT: Pt experiencing need for health education and counseling regarding coping with pain. Pt would benefit from psychoeducation and brief therapeutic interventions regarding coping with pain.  Stage of Change: contemplative  PLAN: 1. F/U with behavioral health consultant in as needed 2. Psychiatric Medications: none. 3. Behavioral recommendation(s):   -Consider making rice sock in lieu of heating pad -Consider reading educational material regarding coping with chronic pain SUBJECTIVE: Pt. referred by Dr Venetia NightAmao for coping with pain:  Pt. reports the following symptoms/concerns: Pt says her primary concern today is a painful back, she says what will help most is pain medication, but that she thinks a heating pad would help, but she has no money to buy one. Pt expresses no other need or concern at this time.  Duration of problem: At least one year Severity: moderate  OBJECTIVE: Orientation & Cognition: Oriented x3. Thought processes normal and appropriate to situation. Mood: appropriate. Affect: appropriate Appearance: appropriate Risk of harm to self or others: no known risk of harm to self or others Substance use: none Assessments administered: none (PHQ9 at 10 [facility]  Diagnosis: Health education/counseling CPT Code: Z71.9 -------------------------------------------- Other(s) present in the room: none  Time spent with patient in exam room: 15 minutes   [facility] 07/23/2015 12/10/2013  Decreased Interest 2 0  Down, Depressed, Hopeless 2 0  PHQ - 2 Score 4 0  Altered sleeping 2 -  Tired, decreased energy 2 -  Change in appetite 2 -  Feeling bad or failure about yourself  0 -  Trouble concentrating 0 -  Moving slowly or fidgety/restless 0 -  Suicidal thoughts 0 -  PHQ-9 Score 10 -

## 2016-02-02 ENCOUNTER — Encounter: Payer: Self-pay | Admitting: Family Medicine

## 2016-04-20 ENCOUNTER — Other Ambulatory Visit: Payer: Self-pay | Admitting: Pharmacist

## 2016-04-20 DIAGNOSIS — I10 Essential (primary) hypertension: Secondary | ICD-10-CM

## 2016-04-20 MED ORDER — ATENOLOL 25 MG PO TABS
25.0000 mg | ORAL_TABLET | Freq: Every morning | ORAL | Status: DC
Start: 2016-04-20 — End: 2016-05-04

## 2016-04-20 MED FILL — ATENOLOL 25 MG TABLET: 25 | 30 days supply | Qty: 30 | Fill #0

## 2016-05-04 ENCOUNTER — Encounter: Payer: Self-pay | Admitting: Family Medicine

## 2016-05-04 ENCOUNTER — Ambulatory Visit: Payer: Medicaid Other | Attending: Family Medicine | Admitting: Family Medicine

## 2016-05-04 VITALS — BP 121/83 | HR 69 | Temp 98.4°F | Ht 64.0 in | Wt 230.6 lb

## 2016-05-04 DIAGNOSIS — R51 Headache: Secondary | ICD-10-CM

## 2016-05-04 DIAGNOSIS — R519 Headache, unspecified: Secondary | ICD-10-CM

## 2016-05-04 DIAGNOSIS — G4489 Other headache syndrome: Secondary | ICD-10-CM | POA: Diagnosis not present

## 2016-05-04 DIAGNOSIS — J4520 Mild intermittent asthma, uncomplicated: Secondary | ICD-10-CM | POA: Insufficient documentation

## 2016-05-04 DIAGNOSIS — G8929 Other chronic pain: Secondary | ICD-10-CM | POA: Diagnosis not present

## 2016-05-04 DIAGNOSIS — I10 Essential (primary) hypertension: Secondary | ICD-10-CM | POA: Insufficient documentation

## 2016-05-04 DIAGNOSIS — M545 Low back pain, unspecified: Secondary | ICD-10-CM

## 2016-05-04 DIAGNOSIS — R011 Cardiac murmur, unspecified: Secondary | ICD-10-CM | POA: Insufficient documentation

## 2016-05-04 DIAGNOSIS — K219 Gastro-esophageal reflux disease without esophagitis: Secondary | ICD-10-CM | POA: Insufficient documentation

## 2016-05-04 DIAGNOSIS — J45909 Unspecified asthma, uncomplicated: Secondary | ICD-10-CM | POA: Insufficient documentation

## 2016-05-04 DIAGNOSIS — Z79899 Other long term (current) drug therapy: Secondary | ICD-10-CM | POA: Insufficient documentation

## 2016-05-04 LAB — COMPLETE METABOLIC PANEL WITH GFR
ALT: 50 U/L — ABNORMAL HIGH (ref 6–29)
AST: 32 U/L (ref 10–35)
Albumin: 4.4 g/dL (ref 3.6–5.1)
Alkaline Phosphatase: 83 U/L (ref 33–130)
BUN: 9 mg/dL (ref 7–25)
CO2: 28 mmol/L (ref 20–31)
Calcium: 9.6 mg/dL (ref 8.6–10.4)
Chloride: 105 mmol/L (ref 98–110)
Creat: 0.54 mg/dL (ref 0.50–1.05)
GFR, Est African American: >89 mL/min (ref >=60)
GFR, Est Non African American: >89 mL/min (ref >=60)
Glucose, Bld: 94 mg/dL (ref 65–99)
Potassium: 3.9 mmol/L (ref 3.5–5.3)
Sodium: 141 mmol/L (ref 135–146)
Total Bilirubin: 0.6 mg/dL (ref 0.2–1.2)
Total Protein: 7.1 g/dL (ref 6.1–8.1)

## 2016-05-04 LAB — LIPID PANEL
Cholesterol: 195 mg/dL (ref 125–200)
HDL: 58 mg/dL (ref >=46)
LDL Cholesterol: 119 mg/dL (ref <130)
Total CHOL/HDL Ratio: 3.4 Ratio (ref <=5.0)
Triglycerides: 91 mg/dL (ref <150)
VLDL: 18 mg/dL (ref <30)

## 2016-05-04 MED ORDER — OMEPRAZOLE 40 MG PO CPDR: 40.0000 mg | DELAYED_RELEASE_CAPSULE | Freq: Every day | ORAL | 0 refills | Status: DC

## 2016-05-04 MED ORDER — NAPROXEN 500 MG PO TABS: 500.0000 mg | ORAL_TABLET | Freq: Two times a day (BID) | ORAL | 3 refills | Status: DC

## 2016-05-04 MED ORDER — ATENOLOL 25 MG PO TABS: 25.0000 mg | ORAL_TABLET | Freq: Every morning | ORAL | 3 refills | Status: DC

## 2016-05-04 MED ORDER — CETIRIZINE HCL 10 MG PO TABS: 10.0000 mg | ORAL_TABLET | Freq: Every day | ORAL | 1 refills | Status: DC

## 2016-05-04 MED ORDER — ALBUTEROL SULFATE HFA 108 (90 BASE) MCG/ACT IN AERS: 2.0000 | INHALATION_SPRAY | Freq: Two times a day (BID) | RESPIRATORY_TRACT | 3 refills | Status: DC

## 2016-05-04 MED ORDER — BUDESONIDE-FORMOTEROL FUMARATE 160-4.5 MCG/ACT IN AERO: 2.0000 | INHALATION_SPRAY | Freq: Two times a day (BID) | RESPIRATORY_TRACT | 3 refills | Status: DC

## 2016-05-04 MED ORDER — CYCLOBENZAPRINE HCL 10 MG PO TABS
10.0000 mg | ORAL_TABLET | Freq: Three times a day (TID) | ORAL | 3 refills | Status: DC | PRN
Start: 2016-05-04 — End: 2016-08-01

## 2016-05-04 MED FILL — ALL DAY ALLERGY 10 MG TAB: 10 | 30 days supply | Qty: 30 | Fill #0

## 2016-05-04 NOTE — Progress Notes (Signed)
Subjective:  Patient ID: [name], female    DOB: Apr 26, 1966  Age: 50 y.o. MRN: [id number]  CC: Headache; Ear Pain; and Fatigue   HPI [name] is a 50 year old female with a history of asthma, hypertension, GERD, chronic low back pain who presents to the clinic for follow-up visit.  Her back pain has been controlled on naproxen and Flexeril; she denies radiation of pain to lower extremities and pain is at a 3/10 at this time.  Denies any recent asthma exacerbation has been compliant with her rescue and controller medications. Reflux symptoms have also been controlled on her PPI.  Complains of a 3 day history of left ear pain, left sided frontal headache and fatigue. Denies fever, runny nose: Nasal congestion and has not used any OTC medications   Past Medical History:  Diagnosis Date  . Asthma   . Gastric reflux   . History of blood transfusion yrs agio, none recent  . Hypertension   . Osteoarthritis   . Sleep apnea    cpap machine has not used since 2014 due to machine/tubing in bad shape   . Syncope and collapse yrs ago    Past Surgical History:  Procedure Laterality Date  . CARDIAC CATHETERIZATION     more than 5 years ago  . CARDIAC CATHETERIZATION  06-07-2012   [facility]: Normal coronary arteries. No significant LVOT/aortic valve gradient  . CHOLECYSTECTOMY    . COLONOSCOPY WITH PROPOFOL N/A 02/27/2014   Procedure: COLONOSCOPY WITH PROPOFOL;  Surgeon: Charolett Bumpers, MD;  Location: [facility];  Service: Endoscopy;  Laterality: N/A;  . ESOPHAGOGASTRODUODENOSCOPY (EGD) WITH PROPOFOL N/A 02/27/2014   Procedure: ESOPHAGOGASTRODUODENOSCOPY (EGD) WITH PROPOFOL;  Surgeon: Charolett Bumpers, MD;  Location: [facility];  Service: Endoscopy;  Laterality: N/A;  . EXPLORATORY LAPAROTOMY  09/2001   ELAP, LOA, R partial oophorectomy, repair of bowel injury  . VAGINAL HYSTERECTOMY  2001   Adnexa left in place     Outpatient Medications Prior to Visit  Medication Sig  Dispense Refill  . albuterol (PROVENTIL HFA;VENTOLIN HFA) 108 (90 BASE) MCG/ACT inhaler Inhale 2 puffs into the lungs 2 (two) times daily.    Marland Kitchen atenolol (TENORMIN) 25 MG tablet Take 1 tablet (25 mg total) by mouth every morning. Needs office visit for refills 30 tablet 0  . budesonide-formoterol (SYMBICORT) 160-4.5 MCG/ACT inhaler Inhale 2 puffs into the lungs 2 (two) times daily.    Marland Kitchen HYDROCODONE-ACETAMINOPHEN PO Take by mouth. Reported on 02/01/2016    . ranitidine (ZANTAC) 150 MG tablet Take 1 tablet (150 mg total) by mouth 2 (two) times daily. (Patient not taking: Reported on 05/04/2016) 60 tablet 3  . cyclobenzaprine (FLEXERIL) 10 MG tablet Take 1 tablet (10 mg total) by mouth 3 (three) times daily as needed for muscle spasms. (Patient not taking: Reported on 05/04/2016) 60 tablet 1  . meclizine (ANTIVERT) 25 MG tablet Take 1 tablet (25 mg total) by mouth 3 (three) times daily as needed for nausea. (Patient not taking: Reported on 10/29/2015) 30 tablet 1  . naproxen (NAPROSYN) 500 MG tablet Take 1 tablet (500 mg total) by mouth 2 (two) times daily with a meal. (Patient not taking: Reported on 05/04/2016) 60 tablet 1  . omeprazole (PRILOSEC) 40 MG capsule Take 1 capsule (40 mg total) by mouth daily. (Patient not taking: Reported on 05/04/2016) 30 capsule 2  . traMADol (ULTRAM) 50 MG tablet Take 1 tablet (50 mg total) by mouth daily as needed for moderate pain. (Patient not taking:  Reported on 05/04/2016) 30 tablet 0   Facility-Administered Medications Prior to Visit  Medication Dose Route Frequency Provider Last Rate Last Dose  . gi cocktail (Maalox,Lidocaine,Donnatal)  30 mL Oral Once Ambrose Finland, NP        ROS Review of Systems  Constitutional: Positive for fatigue. Negative for activity change and appetite change.  HENT: Positive for ear pain. Negative for congestion, sinus pressure and sore throat.   Eyes: Negative for visual disturbance.  Respiratory: Negative for cough, chest tightness,  shortness of breath and wheezing.   Cardiovascular: Negative for chest pain and palpitations.  Gastrointestinal: Negative for abdominal distention, abdominal pain and constipation.  Endocrine: Negative for polydipsia.  Genitourinary: Negative for dysuria and frequency.  Musculoskeletal: Negative for arthralgias and back pain.  Skin: Negative for rash.  Neurological: Positive for headaches. Negative for tremors, light-headedness and numbness.  Hematological: Does not bruise/bleed easily.  Psychiatric/Behavioral: Negative for agitation and behavioral problems.    Objective:  BP 121/83 (BP Location: Left Arm, Patient Position: Sitting, Cuff Size: Large)   Pulse 69   Temp 98.4 F (36.9 C) (Oral)   Ht  (1.626 m)   Wt 230 lb 9.6 oz (104.6 kg)   SpO2 100%   BMI 39.58 kg/m   BP/Weight 05/04/2016 02/01/2016 11/09/2015  Systolic BP 121 142 101  Diastolic BP 83 78 71  Wt. (Lbs) 230.6 234 -  BMI 39.58 40.15 -      Physical Exam  Constitutional: She is oriented to person, place, and time. She appears well-developed and well-nourished.  Cardiovascular: Normal rate and intact distal pulses.   Murmur (2/6 systolic murmur) heard. Pulmonary/Chest: Effort normal and breath sounds normal. She has no wheezes. She has no rales. She exhibits no tenderness.  Abdominal: Soft. Bowel sounds are normal. She exhibits no distension and no mass. There is no tenderness.  Musculoskeletal: Normal range of motion.  Neurological: She is alert and oriented to person, place, and time.  Skin: Skin is warm and dry.  Psychiatric: She has a normal mood and affect.    CMP Latest Ref Rng & Units 06/26/2015 07/21/2014 02/28/2014  Glucose 65 - 99 mg/dL 77 93 97  BUN 6 - 20 mg/dL Creatinine 0.44 - 1.00 mg/dL 1.61 0.96 0.45  Sodium 135 - 145 mmol/L 144 145 145  Potassium 3.5 - 5.1 mmol/L 3.4(L) 3.8 3.1(L)  Chloride 101 - 111 mmol/L 109 106 106  CO2 22 - 32 mmol/L Calcium 8.9 - 10.3 mg/dL 9.5  40.9 9.6  Total Protein 6.0 - 8.3 g/dL - - 7.0  Total Bilirubin 0.3 - 1.2 mg/dL - - 0.8  Alkaline Phos 39 - 117 U/L - - 77  AST 0 - 37 U/L - - 25  ALT 0 - 35 U/L - - 26    Lipid Panel     Component Value Date/Time   CHOL 161 12/10/2013 1032   CHOL 167 03/18/2012 0522   TRIG 69 12/10/2013 1032   TRIG 58 03/18/2012 0522   HDL 39 (L) 12/10/2013 1032   HDL 60 03/18/2012 0522   CHOLHDL 4.1 12/10/2013 1032   VLDL 14 12/10/2013 1032   VLDL 12 03/18/2012 0522   LDLCALC 108 (H) 12/10/2013 1032   LDLCALC 95 03/18/2012 0522      Assessment & Plan:   1. Essential hypertension Controlled - atenolol (TENORMIN) 25 MG tablet; Take 1 tablet (25 mg total) by mouth every morning. Needs office visit  for refills  Dispense: 30 tablet; Refill: 3 - Lipid panel - COMPLETE METABOLIC PANEL WITH GFR  2. Chronic low back pain Controlled on naproxen and Flexeril - cyclobenzaprine (FLEXERIL) 10 MG tablet; Take 1 tablet (10 mg total) by mouth 3 (three) times daily as needed for muscle spasms.  Dispense: 60 tablet; Refill: 3 - naproxen (NAPROSYN) 500 MG tablet; Take 1 tablet (500 mg total) by mouth 2 (two) times daily with a meal.  Dispense: 60 tablet; Refill: 3  3. Gastroesophageal reflux disease, esophagitis presence not specified Stable - omeprazole (PRILOSEC) 40 MG capsule; Take 1 capsule (40 mg total) by mouth daily.  Dispense: 30 capsule; Refill: 0  4. Murmur, cardiac Stable  5. Asthma, mild intermittent, uncomplicated No recent flares - budesonide-formoterol (SYMBICORT) 160-4.5 MCG/ACT inhaler; Inhale 2 puffs into the lungs 2 (two) times daily.  Dispense: 1 Inhaler; Refill: 3 - albuterol (PROVENTIL HFA;VENTOLIN HFA) 108 (90 Base) MCG/ACT inhaler; Inhale 2 puffs into the lungs 2 (two) times daily.  Dispense: 1 Inhaler; Refill: 3  6. Sinus headache This could explain headache, arthralgias and fatigue No indication for antibiotics at this time. Advised to use OTC Tylenol - cetirizine  (ZYRTEC) 10 MG tablet; Take 1 tablet (10 mg total) by mouth daily.  Dispense: 30 tablet; Refill: 1   Meds ordered this encounter  Medications  . atenolol (TENORMIN) 25 MG tablet    Sig: Take 1 tablet (25 mg total) by mouth every morning. Needs office visit for refills    Dispense:  30 tablet    Refill:  3  . cyclobenzaprine (FLEXERIL) 10 MG tablet    Sig: Take 1 tablet (10 mg total) by mouth 3 (three) times daily as needed for muscle spasms.    Dispense:  60 tablet    Refill:  3  . naproxen (NAPROSYN) 500 MG tablet    Sig: Take 1 tablet (500 mg total) by mouth 2 (two) times daily with a meal.    Dispense:  60 tablet    Refill:  3  . budesonide-formoterol (SYMBICORT) 160-4.5 MCG/ACT inhaler    Sig: Inhale 2 puffs into the lungs 2 (two) times daily.    Dispense:  1 Inhaler    Refill:  3  . albuterol (PROVENTIL HFA;VENTOLIN HFA) 108 (90 Base) MCG/ACT inhaler    Sig: Inhale 2 puffs into the lungs 2 (two) times daily.    Dispense:  1 Inhaler    Refill:  3  . omeprazole (PRILOSEC) 40 MG capsule    Sig: Take 1 capsule (40 mg total) by mouth daily.    Dispense:  30 capsule    Refill:  0  . cetirizine (ZYRTEC) 10 MG tablet    Sig: Take 1 tablet (10 mg total) by mouth daily.    Dispense:  30 tablet    Refill:  1    Follow-up: Return in about 3 months (around 08/04/2016) for follow up on hypertension.   Jaclyn Shaggy MD

## 2016-05-04 NOTE — Patient Instructions (Signed)
Hypertension Hypertension, commonly called high blood pressure, is when the force of blood pumping through your arteries is too strong. Your arteries are the blood vessels that carry blood from your heart throughout your body. A blood pressure reading consists of a higher number over a lower number, such as 110/72. The higher number (systolic) is the pressure inside your arteries when your heart pumps. The lower number (diastolic) is the pressure inside your arteries when your heart relaxes. Ideally you want your blood pressure below 120/80. Hypertension forces your heart to work harder to pump blood. Your arteries may become narrow or stiff. Having untreated or uncontrolled hypertension can cause heart attack, stroke, kidney disease, and other problems. RISK FACTORS Some risk factors for high blood pressure are controllable. Others are not.  Risk factors you cannot control include:   Race. You may be at higher risk if you are African American.  Age. Risk increases with age.  Gender. Men are at higher risk than women before age 45 years. After age 65, women are at higher risk than men. Risk factors you can control include:  Not getting enough exercise or physical activity.  Being overweight.  Getting too much fat, sugar, calories, or salt in your diet.  Drinking too much alcohol. SIGNS AND SYMPTOMS Hypertension does not usually cause signs or symptoms. Extremely high blood pressure (hypertensive crisis) may cause headache, anxiety, shortness of breath, and nosebleed. DIAGNOSIS To check if you have hypertension, your health care provider will measure your blood pressure while you are seated, with your arm held at the level of your heart. It should be measured at least twice using the same arm. Certain conditions can cause a difference in blood pressure between your right and left arms. A blood pressure reading that is higher than normal on one occasion does not mean that you need treatment. If  it is not clear whether you have high blood pressure, you may be asked to return on a different day to have your blood pressure checked again. Or, you may be asked to monitor your blood pressure at home for 1 or more weeks. TREATMENT Treating high blood pressure includes making lifestyle changes and possibly taking medicine. Living a healthy lifestyle can help lower high blood pressure. You may need to change some of your habits. Lifestyle changes may include:  Following the DASH diet. This diet is high in fruits, vegetables, and whole grains. It is low in salt, red meat, and added sugars.  Keep your sodium intake below 2,300 mg per day.  Getting at least 30-45 minutes of aerobic exercise at least 4 times per week.  Losing weight if necessary.  Not smoking.  Limiting alcoholic beverages.  Learning ways to reduce stress. Your health care provider may prescribe medicine if lifestyle changes are not enough to get your blood pressure under control, and if one of the following is true:  You are 18-59 years of age and your systolic blood pressure is above 140.  You are 60 years of age or older, and your systolic blood pressure is above 150.  Your diastolic blood pressure is above 90.  You have diabetes, and your systolic blood pressure is over 140 or your diastolic blood pressure is over 90.  You have kidney disease and your blood pressure is above 140/90.  You have heart disease and your blood pressure is above 140/90. Your personal target blood pressure may vary depending on your medical conditions, your age, and other factors. HOME CARE INSTRUCTIONS    Have your blood pressure rechecked as directed by your health care provider.   Take medicines only as directed by your health care provider. Follow the directions carefully. Blood pressure medicines must be taken as prescribed. The medicine does not work as well when you skip doses. Skipping doses also puts you at risk for  problems.  Do not smoke.   Monitor your blood pressure at home as directed by your health care provider. SEEK MEDICAL CARE IF:   You think you are having a reaction to medicines taken.  You have recurrent headaches or feel dizzy.  You have swelling in your ankles.  You have trouble with your vision. SEEK IMMEDIATE MEDICAL CARE IF:  You develop a severe headache or confusion.  You have unusual weakness, numbness, or feel faint.  You have severe chest or abdominal pain.  You vomit repeatedly.  You have trouble breathing. MAKE SURE YOU:   Understand these instructions.  Will watch your condition.  Will get help right away if you are not doing well or get worse.   This information is not intended to replace advice given to you by your health care provider. Make sure you discuss any questions you have with your health care provider.   Document Released: 09/26/2005 Document Revised: 02/10/2015 Document Reviewed: 07/19/2013 Elsevier Interactive Patient Education 2016 [employer]  

## 2016-05-04 NOTE — Progress Notes (Signed)
Headache- forehead Left sided ear pain Fatigue Medication refills

## 2016-05-09 ENCOUNTER — Ambulatory Visit: Payer: Medicaid Other | Admitting: Family Medicine

## 2016-05-11 ENCOUNTER — Telehealth: Payer: Self-pay | Admitting: Family Medicine

## 2016-05-11 NOTE — Telephone Encounter (Signed)
Returned pt's call  - advsd lab results.

## 2016-05-11 NOTE — Telephone Encounter (Signed)
Pt. Called again requesting her Lab results. Please f/u

## 2016-05-11 NOTE — Telephone Encounter (Signed)
Pt. Called requesting her lab results. Please f/u with pt.  °

## 2016-05-11 NOTE — Telephone Encounter (Signed)
Clld  Pt - advsd of lab results. Pt had no other questions.

## 2016-05-11 NOTE — Telephone Encounter (Signed)
-----   Message from Jaclyn Shaggy, MD sent at 05/05/2016  9:37 AM EDT ----- Please inform the patient that her lab results normal.

## 2016-05-22 ENCOUNTER — Encounter (HOSPITAL_COMMUNITY): Payer: Self-pay | Admitting: Emergency Medicine

## 2016-05-22 ENCOUNTER — Emergency Department (HOSPITAL_COMMUNITY)
Admission: EM | Admit: 2016-05-22 | Discharge: 2016-05-22 | Disposition: A | Discharge status: Home / Self Care | Payer: Medicaid Other | Attending: Emergency Medicine | Admitting: Emergency Medicine

## 2016-05-22 ENCOUNTER — Emergency Department (HOSPITAL_COMMUNITY): Payer: Medicaid Other

## 2016-05-22 DIAGNOSIS — J45909 Unspecified asthma, uncomplicated: Secondary | ICD-10-CM | POA: Diagnosis not present

## 2016-05-22 DIAGNOSIS — M545 Low back pain: Secondary | ICD-10-CM | POA: Diagnosis not present

## 2016-05-22 DIAGNOSIS — G8929 Other chronic pain: Secondary | ICD-10-CM | POA: Insufficient documentation

## 2016-05-22 DIAGNOSIS — R52 Pain, unspecified: Secondary | ICD-10-CM

## 2016-05-22 DIAGNOSIS — R531 Weakness: Secondary | ICD-10-CM | POA: Insufficient documentation

## 2016-05-22 DIAGNOSIS — M549 Dorsalgia, unspecified: Secondary | ICD-10-CM

## 2016-05-22 DIAGNOSIS — E876 Hypokalemia: Secondary | ICD-10-CM | POA: Insufficient documentation

## 2016-05-22 DIAGNOSIS — I10 Essential (primary) hypertension: Secondary | ICD-10-CM | POA: Diagnosis not present

## 2016-05-22 LAB — CBC
HCT: 39.1 % (ref 36.0–46.0)
Hemoglobin: 12.3 g/dL (ref 12.0–15.0)
MCH: 26.8 pg (ref 26.0–34.0)
MCHC: 31.5 g/dL (ref 30.0–36.0)
MCV: 85.2 fL (ref 78.0–100.0)
Platelets: 313 10*3/uL (ref 150–400)
RBC: 4.59 MIL/uL (ref 3.87–5.11)
RDW: 14.1 % (ref 11.5–15.5)
WBC: 7.4 10*3/uL (ref 4.0–10.5)

## 2016-05-22 LAB — BASIC METABOLIC PANEL
Anion gap: 6 (ref 5–15)
BUN: 10 mg/dL (ref 6–20)
CO2: 26 mmol/L (ref 22–32)
Calcium: 9.3 mg/dL (ref 8.9–10.3)
Chloride: 108 mmol/L (ref 101–111)
Creatinine, Ser: 0.62 mg/dL (ref 0.44–1.00)
GFR calc Af Amer: >60 mL/min (ref >60)
GFR calc non Af Amer: >60 mL/min (ref >60)
Glucose, Bld: 83 mg/dL (ref 65–99)
Potassium: 3.3 mmol/L — ABNORMAL LOW (ref 3.5–5.1)
Sodium: 140 mmol/L (ref 135–145)

## 2016-05-22 MED ORDER — IBUPROFEN 800 MG PO TABS
800.0000 mg | ORAL_TABLET | Freq: Once | ORAL | Status: AC
  Administered 2016-05-22: 800 mg via ORAL
  Filled 2016-05-22: qty 1

## 2016-05-22 MED ORDER — POTASSIUM CHLORIDE CRYS ER 20 MEQ PO TBCR
40.0000 meq | EXTENDED_RELEASE_TABLET | Freq: Once | ORAL | Status: AC
  Administered 2016-05-22: 40 meq via ORAL
  Filled 2016-05-22: qty 2

## 2016-05-22 MED ORDER — SODIUM CHLORIDE 0.9 % IV BOLUS (SEPSIS)
1000.0000 mL | Freq: Once | INTRAVENOUS | Status: AC
  Administered 2016-05-22: 1000 mL via INTRAVENOUS

## 2016-05-22 MED ORDER — ACETAMINOPHEN 500 MG PO TABS
1000.0000 mg | ORAL_TABLET | Freq: Once | ORAL | Status: AC
  Administered 2016-05-22: 1000 mg via ORAL
  Filled 2016-05-22: qty 2

## 2016-05-22 NOTE — Discharge Instructions (Signed)
Take Tylenol as directed for pain. You can take 2 extra Tylenol every 4 hours as needed. Drink at least six8 ounce glasses of water daily. Contact your primary care physician tomorrow to get rechecked in the office within the next one or 2 weeks. Your blood pressure medications may need to be adjusted.

## 2016-05-22 NOTE — ED Notes (Signed)
Patient Alert and oriented X4. Stable and ambulatory. Patient verbalized understanding of the discharge instructions.  Patient belongings were taken by the patient.  

## 2016-05-22 NOTE — ED Provider Notes (Signed)
[facility]-EMERGENCY DEPT Provider Note   CSN: [id number] Arrival date & time: 05/22/16  1550  First Provider Contact:  First MD Initiated Contact with Patient 05/22/16 1733        History   Chief Complaint Chief Complaint  Patient presents with  . Dizziness  . Back Pain    HPI [name] is a 50 y.o. female.Complains of low nonradiating back pain for several months, pain is midline. Worse with changing positions improved with remaining still. She is treated himself with ibuprofen and Tylenol without relief. No fever no loss of bladder or bowel control. Pain is become worse over the past week. She's also been evaluated in the past for similar pain also been with muscle relaxants, without relief. Patient also complains of lightheadedness for the past 2 days worse with standing and improved with lying supine. She admits to feeling thirsty. She denies blood per rectum or black stools. Denies abdominal pain denies chest pain denies headache denies fever. No other associated symptoms.  HPI  Past Medical History:  Diagnosis Date  . Asthma   . Gastric reflux   . History of blood transfusion yrs agio, none recent  . Hypertension   . Osteoarthritis   . Sleep apnea    cpap machine has not used since 2014 due to machine/tubing in bad shape   . Syncope and collapse yrs ago    Patient Active Problem List   Diagnosis Date Noted  . Gastric reflux 05/04/2016  . Asthma 05/04/2016  . HTN (hypertension) 07/23/2015  . Mild depression 07/23/2015  . Chest pain 06/06/2012  . Murmur, cardiac 06/06/2012    Past Surgical History:  Procedure Laterality Date  . CARDIAC CATHETERIZATION     more than 5 years ago  . CARDIAC CATHETERIZATION  06-07-2012   [facility]: Normal coronary arteries. No significant LVOT/aortic valve gradient  . CHOLECYSTECTOMY    . COLONOSCOPY WITH PROPOFOL N/A 02/27/2014   Procedure: COLONOSCOPY WITH PROPOFOL;  Surgeon: Charolett Bumpers, MD;  Location: [facility];  Service:  Endoscopy;  Laterality: N/A;  . ESOPHAGOGASTRODUODENOSCOPY (EGD) WITH PROPOFOL N/A 02/27/2014   Procedure: ESOPHAGOGASTRODUODENOSCOPY (EGD) WITH PROPOFOL;  Surgeon: Charolett Bumpers, MD;  Location: [facility];  Service: Endoscopy;  Laterality: N/A;  . EXPLORATORY LAPAROTOMY  09/2001   ELAP, LOA, R partial oophorectomy, repair of bowel injury  . VAGINAL HYSTERECTOMY  2001   Adnexa left in place    OB History    Gravida Para Term Preterm AB Living   SAB TAB Ectopic Multiple Live Births                   Home Medications    Prior to Admission medications   Medication Sig Start Date End Date Taking? Authorizing Provider  albuterol (PROVENTIL HFA;VENTOLIN HFA) 108 (90 Base) MCG/ACT inhaler Inhale 2 puffs into the lungs 2 (two) times daily. 05/04/16   Jaclyn Shaggy, MD  atenolol (TENORMIN) 25 MG tablet Take 1 tablet (25 mg total) by mouth every morning. Needs office visit for refills 05/04/16   Jaclyn Shaggy, MD  budesonide-formoterol (SYMBICORT) 160-4.5 MCG/ACT inhaler Inhale 2 puffs into the lungs 2 (two) times daily. 05/04/16   Jaclyn Shaggy, MD  cetirizine (ZYRTEC) 10 MG tablet Take 1 tablet (10 mg total) by mouth daily. 05/04/16   Jaclyn Shaggy, MD  cyclobenzaprine (FLEXERIL) 10 MG tablet Take 1 tablet (10 mg total) by mouth 3 (three) times daily as needed for muscle  spasms. 05/04/16   Jaclyn Shaggy, MD  HYDROCODONE-ACETAMINOPHEN PO Take by mouth. Reported on 02/01/2016    Historical Provider, MD  naproxen (NAPROSYN) 500 MG tablet Take 1 tablet (500 mg total) by mouth 2 (two) times daily with a meal. 05/04/16   Jaclyn Shaggy, MD  omeprazole (PRILOSEC) 40 MG capsule Take 1 capsule (40 mg total) by mouth daily. 05/04/16 06/03/16  Jaclyn Shaggy, MD  ranitidine (ZANTAC) 150 MG tablet Take 1 tablet (150 mg total) by mouth 2 (two) times daily. Patient not taking: Reported on 05/04/2016 02/01/16   Jaclyn Shaggy, MD    Family History History reviewed. No pertinent family history.  Social  History Social History  Substance Use Topics  . Smoking status: Never Smoker  . Smokeless tobacco: Never Used  . Alcohol use No     Allergies   No known allergies   Review of Systems Review of Systems  Constitutional: Negative.   HENT: Negative.   Respiratory: Negative.   Cardiovascular: Negative.   Gastrointestinal: Negative.   Musculoskeletal: Positive for back pain.  Skin: Negative.   Neurological: Positive for light-headedness.  Psychiatric/Behavioral: Negative.   All other systems reviewed and are negative.    Physical Exam Updated Vital Signs BP 102/65 (BP Location: Right Arm)   Pulse 65   Temp 98.4 F (36.9 C)   Resp 18   SpO2 99%   Physical Exam  Constitutional: She is oriented to person, place, and time. She appears well-developed and well-nourished. She appears distressed.  HENT:  Head: Normocephalic and atraumatic.  Eyes: Conjunctivae are normal. Pupils are equal, round, and reactive to light.  Neck: Neck supple. No tracheal deviation present. No thyromegaly present.  Cardiovascular: Normal rate and regular rhythm.   No murmur heard. Pulmonary/Chest: Effort normal and breath sounds normal.  Abdominal: Soft. Bowel sounds are normal. She exhibits no distension. There is no tenderness.  Obese  Musculoskeletal: Normal range of motion. She exhibits no edema or tenderness.  Tire spine nontender. She has pain at lumbar area when she sits up from a supine position.  Neurological: She is alert and oriented to person, place, and time. She has normal reflexes. Coordination normal.  DTR symmetric bilaterally at knee jerk ankle jerk and biceps toes were downgoing bilaterally area and gait normal  Skin: Skin is warm and dry. No rash noted.  Psychiatric: She has a normal mood and affect.  Nursing note and vitals reviewed.    ED Treatments / Results  Labs (all labs ordered are listed, but only abnormal results are displayed) Labs Reviewed  BASIC METABOLIC  PANEL - Abnormal; Notable for the following:       Result Value   Potassium 3.3 (*)    All other components within normal limits  CBC  URINALYSIS, ROUTINE W REFLEX MICROSCOPIC (NOT AT [facility])  CBG MONITORING, ED    EKG  EKG Interpretation  Date/Time:  Sunday May 22 2016 16:05:03 EDT Ventricular Rate:  72 PR Interval:  174 QRS Duration: 94 QT Interval:  412 QTC Calculation: 451 R Axis:   -16 Text Interpretation:  Normal sinus rhythm Minimal voltage criteria for LVH, may be normal variant Borderline ECG No significant change since last tracing Confirmed by Ethelda Chick  MD, Christohper Dube ([id number]) on 05/22/2016 6:22:27 PM       Radiology No results found.  Procedures Procedures (including critical care time) Lightheaded Medications Ordered in ED Medications  ibuprofen (ADVIL,MOTRIN) tablet 800 mg (not administered)  potassium chloride SA (K-DUR,KLOR-CON) CR tablet 40 mEq (  not administered)  sodium chloride 0.9 % bolus 1,000 mL (not administered)   Pain not improved after treatment with ibuprofen. Tylenol ordered. X-rays viewed by me Results for orders placed or performed during the hospital encounter of 05/22/16  Basic metabolic panel  Result Value Ref Range   Sodium 140 135 - 145 mmol/L   Potassium 3.3 (L) 3.5 - 5.1 mmol/L   Chloride 108 101 - 111 mmol/L   CO2 26 22 - 32 mmol/L   Glucose, Bld 83 65 - 99 mg/dL   BUN 10 6 - 20 mg/dL   Creatinine, Ser 1.610.62 0.44 - 1.00 mg/dL   Calcium 9.3 8.9 - 09.610.3 mg/dL   GFR calc non Af Amer >60 >60 mL/min   GFR calc Af Amer >60 >60 mL/min   Anion gap 6 5 - 15  CBC  Result Value Ref Range   WBC 7.4 4.0 - 10.5 K/uL   RBC 4.59 3.87 - 5.11 MIL/uL   Hemoglobin 12.3 12.0 - 15.0 g/dL   HCT 04.539.1 40.936.0 - 81.146.0 %   MCV 85.2 78.0 - 100.0 fL   MCH 26.8 26.0 - 34.0 pg   MCHC 31.5 30.0 - 36.0 g/dL   RDW 91.414.1 78.211.5 - 95.615.5 %   Platelets 313 150 - 400 K/uL   Dg Lumbar Spine Complete  Result Date: 05/22/2016 CLINICAL DATA:  Left low back pain, duration  1 week, worse today. EXAM: LUMBAR SPINE - COMPLETE 4+ VIEW COMPARISON:  11/10/2014 FINDINGS: 5 lumbar type non-rib-bearing vertebra. Lower thoracic spondylosis. No pars defects. No significant malalignment or fracture. Spurring anterior to the vertebral body column of at all levels between T12 and L5. IMPRESSION: 1. Mild to moderate lumbar spondylosis with relatively preserved intervertebral disc height. No significant facet arthropathy. No subluxation or fracture identified. Electronically Signed   By: Gaylyn RongWalter  Liebkemann M.D.   On: 05/22/2016 18:42   Initial Impression / Assessment and Plan / ED Course  I have reviewed the triage vital signs and the nursing notes.  Pertinent labs & imaging results that were available during my care of the patient were reviewed by me and considered in my medical decision making (see chart for details). I explained to patient that I cannot prescribe opioids as they are addicting and cause constipation. Patient has chronic back pain. I suggest oral hydration. She is less lightheaded after treatment with IV normal saline. and able to walk without difficulty also suggests that she call her primary care physician tomorrow for possible adjustment of blood pressure medications Clinical Course      Final Clinical Impressions(s) / ED Diagnoses  Diagnosis #1 chronic low back pain #2 weakness #3 hypokalemia Final diagnoses:  Pain    New Prescriptions New Prescriptions   No medications on file     Doug SouSam Fergus Throne, MD 05/22/16 2124

## 2016-05-22 NOTE — ED Triage Notes (Addendum)
Left lower back pain for about a week; worse today. Dizziness onset today during church when she stood up; reoccurs if she stands up again. Feels like she is going to pass out, but feeling goes away if she sits down. No LOC.

## 2016-05-23 ENCOUNTER — Encounter: Payer: Self-pay | Admitting: Family Medicine

## 2016-05-23 ENCOUNTER — Ambulatory Visit: Payer: Medicaid Other | Attending: Family Medicine | Admitting: Family Medicine

## 2016-05-23 VITALS — BP 123/85 | HR 62 | Temp 98.2°F | Ht 64.0 in | Wt 236.6 lb

## 2016-05-23 DIAGNOSIS — M545 Low back pain, unspecified: Secondary | ICD-10-CM

## 2016-05-23 DIAGNOSIS — Z9889 Other specified postprocedural states: Secondary | ICD-10-CM | POA: Diagnosis not present

## 2016-05-23 DIAGNOSIS — G8929 Other chronic pain: Secondary | ICD-10-CM

## 2016-05-23 DIAGNOSIS — I10 Essential (primary) hypertension: Secondary | ICD-10-CM | POA: Insufficient documentation

## 2016-05-23 DIAGNOSIS — M549 Dorsalgia, unspecified: Secondary | ICD-10-CM | POA: Diagnosis present

## 2016-05-23 DIAGNOSIS — Z79899 Other long term (current) drug therapy: Secondary | ICD-10-CM | POA: Diagnosis not present

## 2016-05-23 LAB — POCT URINALYSIS DIPSTICK
Bilirubin, UA: NEGATIVE
Glucose, UA: NEGATIVE
Ketones, UA: NEGATIVE
Leukocytes, UA: NEGATIVE
Nitrite, UA: NEGATIVE
Protein, UA: NEGATIVE
Spec Grav, UA: <=1.005
Urobilinogen, UA: 0.2
pH, UA: 6

## 2016-05-23 MED ORDER — NAPROXEN 500 MG PO TABS: 500.0000 mg | ORAL_TABLET | Freq: Two times a day (BID) | ORAL | 1 refills | Status: DC

## 2016-05-23 MED ORDER — KETOROLAC TROMETHAMINE 60 MG/2ML IM SOLN
60.0000 mg | Freq: Once | INTRAMUSCULAR | Status: AC
  Administered 2016-05-23: 60 mg via INTRAMUSCULAR

## 2016-05-23 MED FILL — NAPROXEN 500 MG TABLET: 500 | 30 days supply | Qty: 60 | Fill #0

## 2016-05-23 NOTE — Patient Instructions (Signed)

## 2016-05-23 NOTE — Progress Notes (Signed)
Subjective:  Patient ID: Jenna LernerBetsy [name], female    DOB: 11/23/1965  Age: 50 y.o. MRN: [id number]  CC: Back Pain and Hypertension   HPI [name] is a 50 year old female with a history of hypertension who was seen at the ED yesterday for low back pain; naproxen was prescribed which she is yet to pick up from the pharmacy. Lumbar spine x-ray revealed mild to moderate lumbar spondylosis with relatively preserved intervertebral disc height. Pain is on the left side of her back but does not radiate down her lower extremities; she denies numbness, or loss of sphincteric function.  Past Medical History:  Diagnosis Date  . Asthma   . Gastric reflux   . History of blood transfusion yrs agio, none recent  . Hypertension   . Osteoarthritis   . Sleep apnea    cpap machine has not used since 2014 due to machine/tubing in bad shape   . Syncope and collapse yrs ago    Past Surgical History:  Procedure Laterality Date  . CARDIAC CATHETERIZATION     more than 5 years ago  . CARDIAC CATHETERIZATION  06-07-2012   [facility]: Normal coronary arteries. No significant LVOT/aortic valve gradient  . CHOLECYSTECTOMY    . COLONOSCOPY WITH PROPOFOL N/A 02/27/2014   Procedure: COLONOSCOPY WITH PROPOFOL;  Surgeon: Charolett BumpersMartin K Johnson, MD;  Location: [facility];  Service: Endoscopy;  Laterality: N/A;  . ESOPHAGOGASTRODUODENOSCOPY (EGD) WITH PROPOFOL N/A 02/27/2014   Procedure: ESOPHAGOGASTRODUODENOSCOPY (EGD) WITH PROPOFOL;  Surgeon: Charolett BumpersMartin K Johnson, MD;  Location: [facility];  Service: Endoscopy;  Laterality: N/A;  . EXPLORATORY LAPAROTOMY  09/2001   ELAP, LOA, R partial oophorectomy, repair of bowel injury  . VAGINAL HYSTERECTOMY  2001   Adnexa left in place    Allergies  Allergen Reactions  . No Known Allergies       Outpatient Medications Prior to Visit  Medication Sig Dispense Refill  . albuterol (PROVENTIL HFA;VENTOLIN HFA) 108 (90 Base) MCG/ACT inhaler Inhale 2 puffs into the lungs 2 (two)  times daily. 1 Inhaler 3  . atenolol (TENORMIN) 25 MG tablet Take 1 tablet (25 mg total) by mouth every morning. Needs office visit for refills 30 tablet 3  . budesonide-formoterol (SYMBICORT) 160-4.5 MCG/ACT inhaler Inhale 2 puffs into the lungs 2 (two) times daily. 1 Inhaler 3  . cetirizine (ZYRTEC) 10 MG tablet Take 1 tablet (10 mg total) by mouth daily. 30 tablet 1  . cyclobenzaprine (FLEXERIL) 10 MG tablet Take 1 tablet (10 mg total) by mouth 3 (three) times daily as needed for muscle spasms. 60 tablet 3  . omeprazole (PRILOSEC) 40 MG capsule Take 1 capsule (40 mg total) by mouth daily. 30 capsule 0  . ranitidine (ZANTAC) 150 MG tablet Take 1 tablet (150 mg total) by mouth 2 (two) times daily. 60 tablet 3  . naproxen (NAPROSYN) 500 MG tablet Take 1 tablet (500 mg total) by mouth 2 (two) times daily with a meal. (Patient not taking: Reported on 05/22/2016) 60 tablet 3   Facility-Administered Medications Prior to Visit  Medication Dose Route Frequency Provider Last Rate Last Dose  . gi cocktail (Maalox,Lidocaine,Donnatal)  30 mL Oral Once Ambrose FinlandValerie A Keck, NP        ROS Review of Systems  Constitutional: Negative for activity change and appetite change.  HENT: Negative for sinus pressure and sore throat.   Respiratory: Negative for chest tightness, shortness of breath and wheezing.   Cardiovascular: Negative for chest pain and palpitations.  Gastrointestinal: Negative for  abdominal distention, abdominal pain and constipation.  Genitourinary: Negative.   Musculoskeletal: Positive for back pain.  Psychiatric/Behavioral: Negative for behavioral problems and dysphoric mood.    Objective:  BP 123/85 (BP Location: Left Arm, Patient Position: Sitting, Cuff Size: Large)   Pulse 62   Temp 98.2 F (36.8 C) (Oral)   Ht 5\' 4"  (1.626 m)   Wt 236 lb 9.6 oz (107.3 kg)   BMI 40.61 kg/m   BP/Weight 05/23/2016 05/22/2016 05/04/2016  Systolic BP 123 117 121  Diastolic BP 85 71 83  Wt. (Lbs) 236.6 -  230.6  BMI 40.61 - 39.58      Physical Exam  Constitutional: She is oriented to person, place, and time. She appears well-developed and well-nourished.  Cardiovascular: Normal rate, normal heart sounds and intact distal pulses.   No murmur heard. Pulmonary/Chest: Effort normal and breath sounds normal. She has no wheezes. She has no rales. She exhibits no tenderness.  Abdominal: Soft. Bowel sounds are normal. She exhibits no distension and no mass. There is no tenderness.  Musculoskeletal: Normal range of motion.  Neurological: She is alert and oriented to person, place, and time.     Assessment & Plan:   1. Left-sided low back pain without sciatica UA negative for UTI No red flags Tramadol given - Urinalysis Dipstick  2. Chronic low back pain - naproxen (NAPROSYN) 500 MG tablet; Take 1 tablet (500 mg total) by mouth 2 (two) times daily with a meal.  Dispense: 60 tablet; Refill: 1   Meds ordered this encounter  Medications  . naproxen (NAPROSYN) 500 MG tablet    Sig: Take 1 tablet (500 mg total) by mouth 2 (two) times daily with a meal.    Dispense:  60 tablet    Refill:  1    Follow-up: Return in about 1 month (around 06/23/2016), or if symptoms worsen or fail to improve, for Follow-up of hypertension and back pain.   Jaclyn ShaggyEnobong Amao MD

## 2016-05-23 NOTE — Progress Notes (Signed)
Was in the ed last night and was told she was dehydrates Never filled the naproxen would like it sent to our pharmacy

## 2016-06-23 ENCOUNTER — Ambulatory Visit: Payer: Medicaid Other | Attending: Family Medicine | Admitting: Internal Medicine

## 2016-06-23 ENCOUNTER — Encounter (INDEPENDENT_AMBULATORY_CARE_PROVIDER_SITE_OTHER): Payer: Self-pay

## 2016-06-23 ENCOUNTER — Encounter: Payer: Self-pay | Admitting: Internal Medicine

## 2016-06-23 VITALS — BP 100/63 | HR 79 | Temp 99.0°F | Ht 64.0 in | Wt 240.9 lb

## 2016-06-23 DIAGNOSIS — K219 Gastro-esophageal reflux disease without esophagitis: Secondary | ICD-10-CM | POA: Diagnosis not present

## 2016-06-23 DIAGNOSIS — J45909 Unspecified asthma, uncomplicated: Secondary | ICD-10-CM | POA: Diagnosis not present

## 2016-06-23 DIAGNOSIS — Z79899 Other long term (current) drug therapy: Secondary | ICD-10-CM | POA: Diagnosis not present

## 2016-06-23 DIAGNOSIS — R21 Rash and other nonspecific skin eruption: Secondary | ICD-10-CM | POA: Insufficient documentation

## 2016-06-23 DIAGNOSIS — M1990 Unspecified osteoarthritis, unspecified site: Secondary | ICD-10-CM | POA: Diagnosis not present

## 2016-06-23 DIAGNOSIS — I10 Essential (primary) hypertension: Secondary | ICD-10-CM | POA: Diagnosis not present

## 2016-06-23 DIAGNOSIS — G4730 Sleep apnea, unspecified: Secondary | ICD-10-CM | POA: Insufficient documentation

## 2016-06-23 DIAGNOSIS — L299 Pruritus, unspecified: Secondary | ICD-10-CM | POA: Diagnosis not present

## 2016-06-23 MED ORDER — PREDNISONE 10 MG PO TABS: 10.0000 mg | ORAL_TABLET | Freq: Two times a day (BID) | ORAL | 0 refills | Status: AC

## 2016-06-23 MED ORDER — DIPHENHYDRAMINE HCL 25 MG PO TABS: 25.0000 mg | ORAL_TABLET | Freq: Four times a day (QID) | ORAL | 0 refills | Status: DC | PRN

## 2016-06-23 MED ORDER — TRIAMCINOLONE ACETONIDE 0.5 % EX OINT: 1.0000 "application " | TOPICAL_OINTMENT | Freq: Two times a day (BID) | CUTANEOUS | 0 refills | Status: DC

## 2016-06-23 MED FILL — TRIAMCINOLONE 0.5% OINTMENT: 0.5 | 15 days supply | Qty: 30 | Fill #0

## 2016-06-23 MED FILL — predniSONE 10 MG TABS: 10 | 5 days supply | Qty: 10 | Fill #0

## 2016-06-23 MED FILL — NAPROXEN 500 MG TABLET: 500 | 30 days supply | Qty: 60 | Fill #1

## 2016-06-23 NOTE — Patient Instructions (Addendum)
Contact Dermatitis Dermatitis is redness, soreness, and swelling (inflammation) of the skin. Contact dermatitis is a reaction to certain substances that touch the skin. There are two types of contact dermatitis:   Irritant contact dermatitis. This type is caused by something that irritates your skin, such as dry hands from washing them too much. This type does not require previous exposure to the substance for a reaction to occur. This type is more common.  Allergic contact dermatitis. This type is caused by a substance that you are allergic to, such as a nickel allergy or poison ivy. This type only occurs if you have been exposed to the substance (allergen) before. Upon a repeat exposure, your body reacts to the substance. This type is less common. CAUSES  Many different substances can cause contact dermatitis. Irritant contact dermatitis is most commonly caused by exposure to:   Makeup.   Soaps.   Detergents.   Bleaches.   Acids.   Metal salts, such as nickel.  Allergic contact dermatitis is most commonly caused by exposure to:   Poisonous plants.   Chemicals.   Jewelry.   Latex.   Medicines.   Preservatives in products, such as clothing.  RISK FACTORS This condition is more likely to develop in:   People who have jobs that expose them to irritants or allergens.  People who have certain medical conditions, such as asthma or eczema.  SYMPTOMS  Symptoms of this condition may occur anywhere on your body where the irritant has touched you or is touched by you. Symptoms include:  Dryness or flaking.   Redness.   Cracks.   Itching.   Pain or a burning feeling.   Blisters.  Drainage of small amounts of blood or clear fluid from skin cracks. With allergic contact dermatitis, there may also be swelling in areas such as the eyelids, mouth, or genitals.  DIAGNOSIS  This condition is diagnosed with a medical history and physical exam. A patch skin test  may be performed to help determine the cause. If the condition is related to your job, you may need to see an occupational medicine specialist. TREATMENT Treatment for this condition includes figuring out what caused the reaction and protecting your skin from further contact. Treatment may also include:   Steroid creams or ointments. Oral steroid medicines may be needed in more severe cases.  Antibiotics or antibacterial ointments, if a skin infection is present.  Antihistamine lotion or an antihistamine taken by mouth to ease itching.  A bandage (dressing). HOME CARE INSTRUCTIONS Skin Care  Moisturize your skin as needed.   Apply cool compresses to the affected areas.  Try taking a bath with:  Epsom salts. Follow the instructions on the packaging. You can get these at your local pharmacy or grocery store.  Baking soda. Pour a small amount into the bath as directed by your health care provider.  Colloidal oatmeal. Follow the instructions on the packaging. You can get this at your local pharmacy or grocery store.  Try applying baking soda paste to your skin. Stir water into baking soda until it reaches a paste-like consistency.  Do not scratch your skin.  Bathe less frequently, such as every other day.  Bathe in lukewarm water. Avoid using hot water. Medicines  Take or apply over-the-counter and prescription medicines only as told by your health care provider.   If you were prescribed an antibiotic medicine, take or apply your antibiotic as told by your health care provider. Do not stop using the   antibiotic even if your condition starts to improve. General Instructions  Keep all follow-up visits as told by your health care provider. This is important.  Avoid the substance that caused your reaction. If you do not know what caused it, keep a journal to try to track what caused it. Write down:  What you eat.  What cosmetic products you use.  What you drink.  What  you wear in the affected area. This includes jewelry.  If you were given a dressing, take care of it as told by your health care provider. This includes when to change and remove it. SEEK MEDICAL CARE IF:   Your condition does not improve with treatment.  Your condition gets worse.  You have signs of infection such as swelling, tenderness, redness, soreness, or warmth in the affected area.  You have a fever.  You have new symptoms. SEEK IMMEDIATE MEDICAL CARE IF:   You have a severe headache, neck pain, or neck stiffness.  You vomit.  You feel very sleepy.  You notice red streaks coming from the affected area.  Your bone or joint underneath the affected area becomes painful after the skin has healed.  The affected area turns darker.  You have difficulty breathing.   This information is not intended to replace advice given to you by your health care provider. Make sure you discuss any questions you have with your health care provider.   Document Released: 09/23/2000 Document Revised: 06/17/2015 Document Reviewed: 02/11/2015 Elsevier Interactive Patient Education 2016 [employer] Hypertension Hypertension, commonly called high blood pressure, is when the force of blood pumping through your arteries is too strong. Your arteries are the blood vessels that carry blood from your heart throughout your body. A blood pressure reading consists of a higher number over a lower number, such as 110/72. The higher number (systolic) is the pressure inside your arteries when your heart pumps. The lower number (diastolic) is the pressure inside your arteries when your heart relaxes. Ideally you want your blood pressure below 120/80. Hypertension forces your heart to work harder to pump blood. Your arteries may become narrow or stiff. Having untreated or uncontrolled hypertension can cause heart attack, stroke, kidney disease, and other problems. RISK FACTORS Some risk factors for high blood  pressure are controllable. Others are not.  Risk factors you cannot control include:   Race. You may be at higher risk if you are African American.  Age. Risk increases with age.  Gender. Men are at higher risk than women before age 52 years. After age 52, women are at higher risk than men. Risk factors you can control include:  Not getting enough exercise or physical activity.  Being overweight.  Getting too much fat, sugar, calories, or salt in your diet.  Drinking too much alcohol. SIGNS AND SYMPTOMS Hypertension does not usually cause signs or symptoms. Extremely high blood pressure (hypertensive crisis) may cause headache, anxiety, shortness of breath, and nosebleed. DIAGNOSIS To check if you have hypertension, your health care provider will measure your blood pressure while you are seated, with your arm held at the level of your heart. It should be measured at least twice using the same arm. Certain conditions can cause a difference in blood pressure between your right and left arms. A blood pressure reading that is higher than normal on one occasion does not mean that you need treatment. If it is not clear whether you have high blood pressure, you may be asked to return on a  different day to have your blood pressure checked again. Or, you may be asked to monitor your blood pressure at home for 1 or more weeks. TREATMENT Treating high blood pressure includes making lifestyle changes and possibly taking medicine. Living a healthy lifestyle can help lower high blood pressure. You may need to change some of your habits. Lifestyle changes may include:  Following the DASH diet. This diet is high in fruits, vegetables, and whole grains. It is low in salt, red meat, and added sugars.  Keep your sodium intake below 2,300 mg per day.  Getting at least 30-45 minutes of aerobic exercise at least 4 times per week.  Losing weight if necessary.  Not smoking.  Limiting alcoholic  beverages.  Learning ways to reduce stress. Your health care provider may prescribe medicine if lifestyle changes are not enough to get your blood pressure under control, and if one of the following is true:  You are 45-60 years of age and your systolic blood pressure is above 140.  You are 16 years of age or older, and your systolic blood pressure is above 150.  Your diastolic blood pressure is above 90.  You have diabetes, and your systolic blood pressure is over 140 or your diastolic blood pressure is over 90.  You have kidney disease and your blood pressure is above 140/90.  You have heart disease and your blood pressure is above 140/90. Your personal target blood pressure may vary depending on your medical conditions, your age, and other factors. HOME CARE INSTRUCTIONS  Have your blood pressure rechecked as directed by your health care provider.   Take medicines only as directed by your health care provider. Follow the directions carefully. Blood pressure medicines must be taken as prescribed. The medicine does not work as well when you skip doses. Skipping doses also puts you at risk for problems.  Do not smoke.   Monitor your blood pressure at home as directed by your health care provider. SEEK MEDICAL CARE IF:   You think you are having a reaction to medicines taken.  You have recurrent headaches or feel dizzy.  You have swelling in your ankles.  You have trouble with your vision. SEEK IMMEDIATE MEDICAL CARE IF:  You develop a severe headache or confusion.  You have unusual weakness, numbness, or feel faint.  You have severe chest or abdominal pain.  You vomit repeatedly.  You have trouble breathing. MAKE SURE YOU:   Understand these instructions.  Will watch your condition.  Will get help right away if you are not doing well or get worse.   This information is not intended to replace advice given to you by your health care provider. Make sure you  discuss any questions you have with your health care provider.   Document Released: 09/26/2005 Document Revised: 02/10/2015 Document Reviewed: 07/19/2013 Elsevier Interactive Patient Education [employer]

## 2016-06-23 NOTE — Progress Notes (Signed)
[name], is a 50 y.o. female  [id number]  [id number]  DOB - 03/17/1966  Chief Complaint  Patient presents with  . Rash    left arm, left side of back  . Hypertension      Subjective:   [name] is a 50 y.o. female with medical history of hypertension here today for a walk-in visit. Patient started having skin irritation and intense itching since about one week now. She does not have any major skin rash, no fever, no scale but intense itching. She remembered that about a week ago she changed her soap to another brand. No change in cream, no new medication, no recent change in environment. Patient has No headache, No chest pain, No abdominal pain - No Nausea, No new weakness tingling or numbness, No Cough - SOB.  Problem  Itching With Irritation  Essential Hypertension   ALLERGIES: Allergies  Allergen Reactions  . No Known Allergies     PAST MEDICAL HISTORY: Past Medical History:  Diagnosis Date  . Asthma   . Gastric reflux   . History of blood transfusion yrs agio, none recent  . Hypertension   . Osteoarthritis   . Sleep apnea    cpap machine has not used since 2014 due to machine/tubing in bad shape   . Syncope and collapse yrs ago   MEDICATIONS AT HOME: Prior to Admission medications   Medication Sig Start Date End Date Taking? Authorizing Provider  albuterol (PROVENTIL HFA;VENTOLIN HFA) 108 (90 Base) MCG/ACT inhaler Inhale 2 puffs into the lungs 2 (two) times daily. 05/04/16  Yes Jaclyn ShaggyEnobong Amao, MD  atenolol (TENORMIN) 25 MG tablet Take 1 tablet (25 mg total) by mouth every morning. Needs office visit for refills 05/04/16  Yes Jaclyn ShaggyEnobong Amao, MD  budesonide-formoterol (SYMBICORT) 160-4.5 MCG/ACT inhaler Inhale 2 puffs into the lungs 2 (two) times daily. 05/04/16  Yes Jaclyn ShaggyEnobong Amao, MD  cetirizine (ZYRTEC) 10 MG tablet Take 1 tablet (10 mg total) by mouth daily. 05/04/16  Yes Jaclyn ShaggyEnobong Amao, MD  cyclobenzaprine (FLEXERIL) 10 MG tablet Take 1 tablet (10 mg  total) by mouth 3 (three) times daily as needed for muscle spasms. 05/04/16  Yes Jaclyn ShaggyEnobong Amao, MD  naproxen (NAPROSYN) 500 MG tablet Take 1 tablet (500 mg total) by mouth 2 (two) times daily with a meal. 05/23/16  Yes Jaclyn ShaggyEnobong Amao, MD  ranitidine (ZANTAC) 150 MG tablet Take 1 tablet (150 mg total) by mouth 2 (two) times daily. 02/01/16  Yes Jaclyn ShaggyEnobong Amao, MD  diphenhydrAMINE (BENADRYL) 25 MG tablet Take 1 tablet (25 mg total) by mouth every 6 (six) hours as needed for itching. 06/23/16   Quentin Angstlugbemiga E Tanda Morrissey, MD  omeprazole (PRILOSEC) 40 MG capsule Take 1 capsule (40 mg total) by mouth daily. 05/04/16 06/03/16  Jaclyn ShaggyEnobong Amao, MD  predniSONE (DELTASONE) 10 MG tablet Take 1 tablet (10 mg total) by mouth 2 (two) times daily with a meal. 06/23/16 06/28/16  Quentin Angstlugbemiga E Lafreda Casebeer, MD  triamcinolone ointment (KENALOG) 0.5 % Apply 1 application topically 2 (two) times daily. 06/23/16   Quentin Angstlugbemiga E Jawann Urbani, MD   Objective:   Vitals:   06/23/16 1034  BP: 100/63  Pulse: 79  Temp: 99 F (37.2 C)  TempSrc: Oral  SpO2: 98%  Weight: 240 lb 14.4 oz (109.3 kg)  Height: 5\' 4"  (1.626 m)   Exam General appearance : Awake, alert, not in any distress. Speech Clear. Not toxic looking HEENT: Atraumatic and Normocephalic, pupils equally reactive to light and accomodation Neck: Supple, no JVD.  No cervical lymphadenopathy.  Chest: Good air entry bilaterally, no added sounds  CVS: S1 S2 regular, no murmurs.  Abdomen: Bowel sounds present, Non tender and not distended with no gaurding, rigidity or rebound. Extremities: B/L Lower Ext shows no edema, both legs are warm to touch Neurology: Awake alert, and oriented X 3, CN II-XII intact, Non focal Skin: No Rash but scratch marks  Data Review Lab Results  Component Value Date   HGBA1C 5.5 12/10/2013   Assessment & Plan   1. Itching with irritation  - predniSONE (DELTASONE) 10 MG tablet; Take 1 tablet (10 mg total) by mouth 2 (two) times daily with a meal.  Dispense: 10  tablet; Refill: 0 - diphenhydrAMINE (BENADRYL) 25 MG tablet; Take 1 tablet (25 mg total) by mouth every 6 (six) hours as needed for itching.  Dispense: 30 tablet; Refill: 0 - triamcinolone ointment (KENALOG) 0.5 %; Apply 1 application topically 2 (two) times daily.  Dispense: 30 g; Refill: 0 - Stop using the new soap  2. Essential hypertension  We have discussed target BP range and blood pressure goal. I have advised patient to check BP regularly and to call us back or report to clinic if the numbers are consistently higher than 140/90. We discussed the importance of compliance with medical therapy and DASH diet recommended, consequences of uncontrolled hypertension discussed.  - continue current BP medications  Patient have been counseled extensively about nutrition and exercise  Return in about 3 months (around 09/22/2016) for Follow up HTN.  The patient was given clear instructions to go to ER or return to medical center if symptoms don't improve, worsen or new problems develop. The patient verbalized understanding. The patient was told to call to get lab results if they haven't heard anything in the next week.   This note has been created with Education officer, environmental. Any transcriptional errors are unintentional.    Jeanann Lewandowsky, MD, MHA, Maxwell Caul, CPE [facility] [place], [place] [phone]   06/23/2016, 11:41 AM

## 2016-07-01 ENCOUNTER — Ambulatory Visit: Payer: Medicaid Other | Admitting: Family Medicine

## 2016-08-01 ENCOUNTER — Ambulatory Visit: Payer: Medicaid Other | Attending: Family Medicine | Admitting: Family Medicine

## 2016-08-01 ENCOUNTER — Other Ambulatory Visit: Payer: Self-pay | Admitting: Pharmacist

## 2016-08-01 ENCOUNTER — Encounter: Payer: Self-pay | Admitting: Family Medicine

## 2016-08-01 VITALS — BP 116/74 | HR 69 | Temp 98.1°F | Ht 64.0 in | Wt 241.4 lb

## 2016-08-01 DIAGNOSIS — K219 Gastro-esophageal reflux disease without esophagitis: Secondary | ICD-10-CM | POA: Diagnosis not present

## 2016-08-01 DIAGNOSIS — G8929 Other chronic pain: Secondary | ICD-10-CM

## 2016-08-01 DIAGNOSIS — M545 Low back pain: Secondary | ICD-10-CM | POA: Insufficient documentation

## 2016-08-01 DIAGNOSIS — J4530 Mild persistent asthma, uncomplicated: Secondary | ICD-10-CM

## 2016-08-01 DIAGNOSIS — I10 Essential (primary) hypertension: Secondary | ICD-10-CM | POA: Diagnosis not present

## 2016-08-01 DIAGNOSIS — Z79899 Other long term (current) drug therapy: Secondary | ICD-10-CM | POA: Insufficient documentation

## 2016-08-01 DIAGNOSIS — M5442 Lumbago with sciatica, left side: Secondary | ICD-10-CM

## 2016-08-01 MED ORDER — CYCLOBENZAPRINE HCL 10 MG PO TABS: 10.0000 mg | ORAL_TABLET | Freq: Two times a day (BID) | ORAL | 3 refills | Status: DC | PRN

## 2016-08-01 MED ORDER — ACETAMINOPHEN-CODEINE #3 300-30 MG PO TABS: 1.0000 | ORAL_TABLET | Freq: Two times a day (BID) | ORAL | 0 refills | Status: DC | PRN

## 2016-08-01 MED ORDER — OMEPRAZOLE 40 MG PO CPDR: 40.0000 mg | DELAYED_RELEASE_CAPSULE | Freq: Every day | ORAL | 3 refills | Status: DC

## 2016-08-01 MED ORDER — BUDESONIDE-FORMOTEROL FUMARATE 160-4.5 MCG/ACT IN AERO: 2.0000 | INHALATION_SPRAY | Freq: Two times a day (BID) | RESPIRATORY_TRACT | 3 refills | Status: DC

## 2016-08-01 MED ORDER — METOPROLOL SUCCINATE ER 25 MG PO TB24: 12.5000 mg | ORAL_TABLET | Freq: Every day | ORAL | 2 refills | Status: DC

## 2016-08-01 MED ORDER — ATENOLOL 25 MG PO TABS: 25.0000 mg | ORAL_TABLET | Freq: Every morning | ORAL | 3 refills | Status: DC

## 2016-08-01 MED ORDER — ALBUTEROL SULFATE HFA 108 (90 BASE) MCG/ACT IN AERS: 2.0000 | INHALATION_SPRAY | Freq: Two times a day (BID) | RESPIRATORY_TRACT | 3 refills | Status: DC

## 2016-08-01 MED ORDER — KETOROLAC TROMETHAMINE 60 MG/2ML IM SOLN
60.0000 mg | Freq: Once | INTRAMUSCULAR | Status: AC
  Administered 2016-08-01: 60 mg via INTRAMUSCULAR

## 2016-08-01 NOTE — Patient Instructions (Signed)

## 2016-08-01 NOTE — Progress Notes (Signed)
Subjective:  Patient ID: [name], female    DOB: 08/07/1966  Age: 50 y.o. MRN: [id number]  CC: Back Pain (lower)   HPI [name] is a 50 year old female with a history of hypertension who comes in for a follow up of Hypertension, Asthma, GERD and low back pain.  Low back pain is currently uncontrolled on naproxen and Flexeril  Lumbar spine x-ray revealed mild to moderate lumbar spondylosis with relatively preserved intervertebral disc height. Pain is on the left side of her back but does not radiate down her lower extremities; she denies numbness, or loss of sphincteric function.  Remains compliant with her antihypertensives and low-sodium diet, denies any asthma exacerbation. Reflux symptoms are controlled on her PPI.   Past Medical History:  Diagnosis Date  . Asthma   . Gastric reflux   . History of blood transfusion yrs agio, none recent  . Hypertension   . Osteoarthritis   . Sleep apnea    cpap machine has not used since 2014 due to machine/tubing in bad shape   . Syncope and collapse yrs ago    Past Surgical History:  Procedure Laterality Date  . CARDIAC CATHETERIZATION     more than 5 years ago  . CARDIAC CATHETERIZATION  06-07-2012   [facility]: Normal coronary arteries. No significant LVOT/aortic valve gradient  . CHOLECYSTECTOMY    . COLONOSCOPY WITH PROPOFOL N/A 02/27/2014   Procedure: COLONOSCOPY WITH PROPOFOL;  Surgeon: Charolett BumpersMartin K Johnson, MD;  Location: [facility];  Service: Endoscopy;  Laterality: N/A;  . ESOPHAGOGASTRODUODENOSCOPY (EGD) WITH PROPOFOL N/A 02/27/2014   Procedure: ESOPHAGOGASTRODUODENOSCOPY (EGD) WITH PROPOFOL;  Surgeon: Charolett BumpersMartin K Johnson, MD;  Location: [facility];  Service: Endoscopy;  Laterality: N/A;  . EXPLORATORY LAPAROTOMY  09/2001   ELAP, LOA, R partial oophorectomy, repair of bowel injury  . VAGINAL HYSTERECTOMY  2001   Adnexa left in place    Allergies  Allergen Reactions  . No Known Allergies      Outpatient Medications  Prior to Visit  Medication Sig Dispense Refill  . cetirizine (ZYRTEC) 10 MG tablet Take 1 tablet (10 mg total) by mouth daily. 30 tablet 1  . diphenhydrAMINE (BENADRYL) 25 MG tablet Take 1 tablet (25 mg total) by mouth every 6 (six) hours as needed for itching. 30 tablet 0  . naproxen (NAPROSYN) 500 MG tablet Take 1 tablet (500 mg total) by mouth 2 (two) times daily with a meal. 60 tablet 1  . triamcinolone ointment (KENALOG) 0.5 % Apply 1 application topically 2 (two) times daily. 30 g 0  . albuterol (PROVENTIL HFA;VENTOLIN HFA) 108 (90 Base) MCG/ACT inhaler Inhale 2 puffs into the lungs 2 (two) times daily. 1 Inhaler 3  . atenolol (TENORMIN) 25 MG tablet Take 1 tablet (25 mg total) by mouth every morning. Needs office visit for refills 30 tablet 3  . budesonide-formoterol (SYMBICORT) 160-4.5 MCG/ACT inhaler Inhale 2 puffs into the lungs 2 (two) times daily. 1 Inhaler 3  . cyclobenzaprine (FLEXERIL) 10 MG tablet Take 1 tablet (10 mg total) by mouth 3 (three) times daily as needed for muscle spasms. 60 tablet 3  . ranitidine (ZANTAC) 150 MG tablet Take 1 tablet (150 mg total) by mouth 2 (two) times daily. 60 tablet 3  . omeprazole (PRILOSEC) 40 MG capsule Take 1 capsule (40 mg total) by mouth daily. 30 capsule 0   Facility-Administered Medications Prior to Visit  Medication Dose Route Frequency Provider Last Rate Last Dose  . gi cocktail (Maalox,Lidocaine,Donnatal)  30 mL  Oral Once Ambrose Finland, NP        ROS Review of Systems  Constitutional: Negative for activity change, appetite change and fatigue.  HENT: Negative for congestion, sinus pressure and sore throat.   Eyes: Negative for visual disturbance.  Respiratory: Negative for cough, chest tightness, shortness of breath and wheezing.   Cardiovascular: Negative for chest pain and palpitations.  Gastrointestinal: Negative for abdominal distention, abdominal pain and constipation.  Endocrine: Negative for polydipsia.  Genitourinary:  Negative for dysuria and frequency.  Musculoskeletal: Negative for arthralgias and back pain.  Skin: Negative for rash.  Neurological: Negative for tremors, light-headedness and numbness.  Hematological: Does not bruise/bleed easily.  Psychiatric/Behavioral: Negative for agitation and behavioral problems.    Objective:  BP 116/74 (BP Location: Right Arm, Patient Position: Sitting, Cuff Size: Large)   Pulse 69   Temp 98.1 F (36.7 C) (Oral)   Ht 5\' 4"  (1.626 m)   Wt 241 lb 6.4 oz (109.5 kg)   SpO2 98%   BMI 41.44 kg/m   BP/Weight 08/01/2016 06/23/2016 05/23/2016  Systolic BP 116 100 123  Diastolic BP 74 63 85  Wt. (Lbs) 241.4 240.9 236.6  BMI 41.44 41.35 40.61      Physical Exam  Constitutional: She is oriented to person, place, and time. She appears well-developed and well-nourished.  Cardiovascular: Normal rate, normal heart sounds and intact distal pulses.   No murmur heard. Pulmonary/Chest: Effort normal and breath sounds normal. She has no wheezes. She has no rales. She exhibits no tenderness.  Abdominal: Soft. Bowel sounds are normal. She exhibits no distension and no mass. There is no tenderness.  Musculoskeletal: She exhibits tenderness (mild left lumbar tenderness, negative straight leg raise bilaterally).  Neurological: She is alert and oriented to person, place, and time.    CLINICAL DATA:  Left low back pain, duration 1 week, worse today.  EXAM: LUMBAR SPINE - COMPLETE 4+ VIEW  COMPARISON:  11/10/2014  FINDINGS: 5 lumbar type non-rib-bearing vertebra. Lower thoracic spondylosis. No pars defects. No significant malalignment or fracture. Spurring anterior to the vertebral body column of at all levels between T12 and L5.  IMPRESSION: 1. Mild to moderate lumbar spondylosis with relatively preserved intervertebral disc height. No significant facet arthropathy. No subluxation or fracture identified.   Electronically Signed   By: Gaylyn Rong  M.D.   On: 05/22/2016 18:42     CMP Latest Ref Rng & Units 05/22/2016 05/04/2016 06/26/2015  Glucose 65 - 99 mg/dL 83 94 77  BUN 6 - 20 mg/dL 10 9 8   Creatinine 0.44 - 1.00 mg/dL 1.61 0.96 0.45  Sodium 135 - 145 mmol/L 140 141 144  Potassium 3.5 - 5.1 mmol/L 3.3(L) 3.9 3.4(L)  Chloride 101 - 111 mmol/L 108 105 109  CO2 22 - 32 mmol/L 26 28 28   Calcium 8.9 - 10.3 mg/dL 9.3 9.6 9.5  Total Protein 6.1 - 8.1 g/dL - 7.1 -  Total Bilirubin 0.2 - 1.2 mg/dL - 0.6 -  Alkaline Phos 33 - 130 U/L - 83 -  AST 10 - 35 U/L - 32 -  ALT 6 - 29 U/L - 50(H) -    Lipid Panel     Component Value Date/Time   CHOL 195 05/04/2016 1004   CHOL 167 03/18/2012 0522   TRIG 91 05/04/2016 1004   TRIG 58 03/18/2012 0522   HDL 58 05/04/2016 1004   HDL 60 03/18/2012 0522   CHOLHDL 3.4 05/04/2016 1004   VLDL 18 05/04/2016 1004  VLDL 12 03/18/2012 0522   LDLCALC 119 05/04/2016 1004   LDLCALC 95 03/18/2012 0522    Assessment & Plan:   1. Essential hypertension Uncontrolled - atenolol (TENORMIN) 25 MG tablet; Take 1 tablet (25 mg total) by mouth every morning. Needs office visit for refills  Dispense: 30 tablet; Refill: 3  2. Chronic left-sided low back pain with left-sided sciatica Uncontrolled Lumbar spine x-ray feels spondylosis We'll add Tylenol No. 3 2 regimen - cyclobenzaprine (FLEXERIL) 10 MG tablet; Take 1 tablet (10 mg total) by mouth 2 (two) times daily as needed for muscle spasms.  Dispense: 60 tablet; Refill: 3 - acetaminophen-codeine (TYLENOL #3) 300-30 MG tablet; Take 1 tablet by mouth every 12 (twelve) hours as needed for moderate pain.  Dispense: 60 tablet; Refill: 0 - ketorolac (TORADOL) injection 60 mg; Inject 2 mLs (60 mg total) into the muscle once.  3. Gastroesophageal reflux disease, esophagitis presence not specified Controlled - omeprazole (PRILOSEC) 40 MG capsule; Take 1 capsule (40 mg total) by mouth daily.  Dispense: 30 capsule; Refill: 3  4. Mild persistent asthma without  complication Stable - budesonide-formoterol (SYMBICORT) 160-4.5 MCG/ACT inhaler; Inhale 2 puffs into the lungs 2 (two) times daily.  Dispense: 1 Inhaler; Refill: 3 - albuterol (PROVENTIL HFA;VENTOLIN HFA) 108 (90 Base) MCG/ACT inhaler; Inhale 2 puffs into the lungs 2 (two) times daily.  Dispense: 1 Inhaler; Refill: 3   Meds ordered this encounter  Medications  . atenolol (TENORMIN) 25 MG tablet    Sig: Take 1 tablet (25 mg total) by mouth every morning. Needs office visit for refills    Dispense:  30 tablet    Refill:  3  . budesonide-formoterol (SYMBICORT) 160-4.5 MCG/ACT inhaler    Sig: Inhale 2 puffs into the lungs 2 (two) times daily.    Dispense:  1 Inhaler    Refill:  3  . albuterol (PROVENTIL HFA;VENTOLIN HFA) 108 (90 Base) MCG/ACT inhaler    Sig: Inhale 2 puffs into the lungs 2 (two) times daily.    Dispense:  1 Inhaler    Refill:  3  . cyclobenzaprine (FLEXERIL) 10 MG tablet    Sig: Take 1 tablet (10 mg total) by mouth 2 (two) times daily as needed for muscle spasms.    Dispense:  60 tablet    Refill:  3  . omeprazole (PRILOSEC) 40 MG capsule    Sig: Take 1 capsule (40 mg total) by mouth daily.    Dispense:  30 capsule    Refill:  3  . DISCONTD: acetaminophen-codeine (TYLENOL #3) 300-30 MG tablet    Sig: Take 1 tablet by mouth every 12 (twelve) hours as needed for moderate pain.    Dispense:  60 tablet    Refill:  0  . acetaminophen-codeine (TYLENOL #3) 300-30 MG tablet    Sig: Take 1 tablet by mouth every 12 (twelve) hours as needed for moderate pain.    Dispense:  60 tablet    Refill:  0  . ketorolac (TORADOL) injection 60 mg    Follow-up: Return in about 3 months (around 11/01/2016) for follow up of Hypertension and back pain.   Jaclyn Shaggy MD

## 2016-08-02 MED FILL — ACETAMINOPHEN/COD #3 TABLET: 300-30 | 30 days supply | Qty: 60 | Fill #0

## 2016-08-22 ENCOUNTER — Encounter: Payer: Medicaid Other | Admitting: Family Medicine

## 2016-09-10 ENCOUNTER — Other Ambulatory Visit: Payer: Self-pay | Admitting: Family Medicine

## 2016-09-20 ENCOUNTER — Encounter: Payer: Self-pay | Admitting: Family Medicine

## 2016-09-20 ENCOUNTER — Ambulatory Visit: Payer: Medicaid Other | Attending: Family Medicine | Admitting: Family Medicine

## 2016-09-20 DIAGNOSIS — Z79899 Other long term (current) drug therapy: Secondary | ICD-10-CM | POA: Insufficient documentation

## 2016-09-20 DIAGNOSIS — R519 Headache, unspecified: Secondary | ICD-10-CM

## 2016-09-20 DIAGNOSIS — M5442 Lumbago with sciatica, left side: Secondary | ICD-10-CM

## 2016-09-20 DIAGNOSIS — I10 Essential (primary) hypertension: Secondary | ICD-10-CM

## 2016-09-20 DIAGNOSIS — G8929 Other chronic pain: Secondary | ICD-10-CM

## 2016-09-20 DIAGNOSIS — J4530 Mild persistent asthma, uncomplicated: Secondary | ICD-10-CM | POA: Diagnosis not present

## 2016-09-20 DIAGNOSIS — R51 Headache: Secondary | ICD-10-CM | POA: Insufficient documentation

## 2016-09-20 DIAGNOSIS — M545 Low back pain: Secondary | ICD-10-CM | POA: Diagnosis present

## 2016-09-20 DIAGNOSIS — K219 Gastro-esophageal reflux disease without esophagitis: Secondary | ICD-10-CM | POA: Insufficient documentation

## 2016-09-20 MED ORDER — ATENOLOL 25 MG PO TABS: 25.0000 mg | ORAL_TABLET | Freq: Every morning | ORAL | 1 refills | Status: DC

## 2016-09-20 MED ORDER — ALBUTEROL SULFATE HFA 108 (90 BASE) MCG/ACT IN AERS: 2.0000 | INHALATION_SPRAY | Freq: Two times a day (BID) | RESPIRATORY_TRACT | 3 refills | Status: DC

## 2016-09-20 MED ORDER — NAPROXEN 500 MG PO TABS: 500.0000 mg | ORAL_TABLET | Freq: Two times a day (BID) | ORAL | 1 refills | Status: DC

## 2016-09-20 MED ORDER — METOPROLOL SUCCINATE ER 25 MG PO TB24: 12.5000 mg | ORAL_TABLET | Freq: Every day | ORAL | 1 refills | Status: DC

## 2016-09-20 MED ORDER — CETIRIZINE HCL 10 MG PO TABS: 10.0000 mg | ORAL_TABLET | Freq: Every day | ORAL | 3 refills | Status: DC

## 2016-09-20 MED ORDER — ACETAMINOPHEN-CODEINE #3 300-30 MG PO TABS: 1.0000 | ORAL_TABLET | Freq: Two times a day (BID) | ORAL | 1 refills | Status: DC | PRN

## 2016-09-20 MED ORDER — CYCLOBENZAPRINE HCL 10 MG PO TABS: 10.0000 mg | ORAL_TABLET | Freq: Two times a day (BID) | ORAL | 3 refills | Status: DC | PRN

## 2016-09-20 MED ORDER — OMEPRAZOLE 40 MG PO CPDR: 40.0000 mg | DELAYED_RELEASE_CAPSULE | Freq: Every day | ORAL | 3 refills | Status: DC

## 2016-09-20 MED ORDER — BUDESONIDE-FORMOTEROL FUMARATE 160-4.5 MCG/ACT IN AERO: 2.0000 | INHALATION_SPRAY | Freq: Two times a day (BID) | RESPIRATORY_TRACT | 3 refills | Status: DC

## 2016-09-20 MED FILL — ACETAMINOPHEN/COD #3 TABLET: 300-30 | 30 days supply | Qty: 60 | Fill #0

## 2016-09-20 NOTE — Progress Notes (Signed)
Subjective:  Patient ID: [name], female    DOB: 05/16/1966  Age: 50 y.o. MRN: [id number]  CC: Back Pain (lower)   HPI [name] is a 50 year old female with a history of hypertension who comes in for a follow up of Hypertension, Asthma, GERD and low back pain.  Low back pain is currently controlled on Tylenol#3 and she is requesting a refill. Lumbar spine x-ray revealed mild to moderate lumbar spondylosis with relatively preserved intervertebral disc height. Pain is on the left side of her back but does not radiate down her lower extremities; she denies numbness, or loss of sphincteric function.  Remains compliant with her antihypertensives and low-sodium diet, denies any asthma exacerbation. Reflux symptoms are controlled on her PPI.  Past Medical History:  Diagnosis Date  . Asthma   . Gastric reflux   . History of blood transfusion yrs agio, none recent  . Hypertension   . Osteoarthritis   . Sleep apnea    cpap machine has not used since 2014 due to machine/tubing in bad shape   . Syncope and collapse yrs ago    Past Surgical History:  Procedure Laterality Date  . CARDIAC CATHETERIZATION     more than 5 years ago  . CARDIAC CATHETERIZATION  06-07-2012   [facility]: Normal coronary arteries. No significant LVOT/aortic valve gradient  . CHOLECYSTECTOMY    . COLONOSCOPY WITH PROPOFOL N/A 02/27/2014   Procedure: COLONOSCOPY WITH PROPOFOL;  Surgeon: Charolett Bumpers, MD;  Location: [facility];  Service: Endoscopy;  Laterality: N/A;  . ESOPHAGOGASTRODUODENOSCOPY (EGD) WITH PROPOFOL N/A 02/27/2014   Procedure: ESOPHAGOGASTRODUODENOSCOPY (EGD) WITH PROPOFOL;  Surgeon: Charolett Bumpers, MD;  Location: [facility];  Service: Endoscopy;  Laterality: N/A;  . EXPLORATORY LAPAROTOMY  09/2001   ELAP, LOA, R partial oophorectomy, repair of bowel injury  . VAGINAL HYSTERECTOMY  2001   Adnexa left in place    Allergies  Allergen Reactions  . No Known Allergies     Outpatient  Medications Prior to Visit  Medication Sig Dispense Refill  . triamcinolone ointment (KENALOG) 0.5 % Apply 1 application topically 2 (two) times daily. 30 g 0  . albuterol (PROVENTIL HFA;VENTOLIN HFA) 108 (90 Base) MCG/ACT inhaler Inhale 2 puffs into the lungs 2 (two) times daily. 1 Inhaler 3  . atenolol (TENORMIN) 25 MG tablet Take 1 tablet (25 mg total) by mouth every morning. Needs office visit for refills 30 tablet 3  . budesonide-formoterol (SYMBICORT) 160-4.5 MCG/ACT inhaler Inhale 2 puffs into the lungs 2 (two) times daily. 1 Inhaler 3  . cyclobenzaprine (FLEXERIL) 10 MG tablet Take 1 tablet (10 mg total) by mouth 2 (two) times daily as needed for muscle spasms. 60 tablet 3  . diphenhydrAMINE (BENADRYL) 25 MG tablet Take 1 tablet (25 mg total) by mouth every 6 (six) hours as needed for itching. (Patient not taking: Reported on 09/20/2016) 30 tablet 0  . acetaminophen-codeine (TYLENOL #3) 300-30 MG tablet Take 1 tablet by mouth every 12 (twelve) hours as needed for moderate pain. (Patient not taking: Reported on 09/20/2016) 60 tablet 0  . cetirizine (ZYRTEC) 10 MG tablet Take 1 tablet (10 mg total) by mouth daily. (Patient not taking: Reported on 09/20/2016) 30 tablet 1  . metoprolol succinate (TOPROL-XL) 25 MG 24 hr tablet TAKE 1/2 TABLET BY MOUTH DAILY (Patient not taking: Reported on 09/20/2016) 45 tablet 0  . naproxen (NAPROSYN) 500 MG tablet Take 1 tablet (500 mg total) by mouth 2 (two) times daily with a meal. (  Patient not taking: Reported on 09/20/2016) 60 tablet 1  . omeprazole (PRILOSEC) 40 MG capsule Take 1 capsule (40 mg total) by mouth daily. 30 capsule 3   Facility-Administered Medications Prior to Visit  Medication Dose Route Frequency Provider Last Rate Last Dose  . gi cocktail (Maalox,Lidocaine,Donnatal)  30 mL Oral Once Ambrose FinlandValerie A Keck, NP        ROS Review of Systems Constitutional: Negative for activity change, appetite change and fatigue.  HENT: Negative for congestion,  sinus pressure and sore throat.   Eyes: Negative for visual disturbance.  Respiratory: Negative for cough, chest tightness, shortness of breath and wheezing.   Cardiovascular: Negative for chest pain and palpitations.  Gastrointestinal: Negative for abdominal distention, abdominal pain and constipation.  Endocrine: Negative for polydipsia.  Genitourinary: Negative for dysuria and frequency.  Musculoskeletal: Negative for arthralgias and back pain.  Skin: Negative for rash.  Neurological: Negative for tremors, light-headedness and numbness.  Hematological: Does not bruise/bleed easily.  Psychiatric/Behavioral: Negative for agitation and behavioral problems.  Objective:  BP 119/79 (BP Location: Right Arm, Patient Position: Sitting, Cuff Size: Large)   Pulse 71   Temp 98.2 F (36.8 C) (Oral)   Ht 5\' 4"  (1.626 m)   Wt 243 lb 9.6 oz (110.5 kg)   SpO2 99%   BMI 41.81 kg/m   BP/Weight 09/20/2016 08/01/2016 06/23/2016  Systolic BP 119 116 100  Diastolic BP 79 74 63  Wt. (Lbs) 243.6 241.4 240.9  BMI 41.81 41.44 41.35      Physical Exam Constitutional: She is oriented to person, place, and time. She appears well-developed and well-nourished.  Cardiovascular: Normal rate, normal heart sounds and intact distal pulses.   +2/6 systolic murmur heard. Pulmonary/Chest: Effort normal and breath sounds normal. She has no wheezes. She has no rales. She exhibits no tenderness.  Abdominal: Soft. Bowel sounds are normal. She exhibits no distension and no mass. There is no tenderness.  Musculoskeletal: She exhibits tenderness (mild left lumbar tenderness, negative straight leg raise bilaterally).  Neurological: She is alert and oriented to person, place, and time.  Psych: normal  Assessment & Plan:   1. Chronic left-sided low back pain with left-sided sciatica Apply heat Discussed stretching exercises - naproxen (NAPROSYN) 500 MG tablet; Take 1 tablet (500 mg total) by mouth 2 (two) times  daily with a meal.  Dispense: 120 tablet; Refill: 1 - acetaminophen-codeine (TYLENOL #3) 300-30 MG tablet; Take 1 tablet by mouth every 12 (twelve) hours as needed for moderate pain.  Dispense: 60 tablet; Refill: 1 - cyclobenzaprine (FLEXERIL) 10 MG tablet; Take 1 tablet (10 mg total) by mouth 2 (two) times daily as needed for muscle spasms.  Dispense: 60 tablet; Refill: 3  2. Gastroesophageal reflux disease, esophagitis presence not specified Stable - omeprazole (PRILOSEC) 40 MG capsule; Take 1 capsule (40 mg total) by mouth daily.  Dispense: 30 capsule; Refill: 3  3. Mild persistent asthma without complication No acute exacerbation - budesonide-formoterol (SYMBICORT) 160-4.5 MCG/ACT inhaler; Inhale 2 puffs into the lungs 2 (two) times daily.  Dispense: 1 Inhaler; Refill: 3 - albuterol (PROVENTIL HFA;VENTOLIN HFA) 108 (90 Base) MCG/ACT inhaler; Inhale 2 puffs into the lungs 2 (two) times daily.  Dispense: 1 Inhaler; Refill: 3  4. Essential hypertension Controlled - atenolol (TENORMIN) 25 MG tablet; Take 1 tablet (25 mg total) by mouth every morning. Needs office visit for refills  Dispense: 90 tablet; Refill: 1  5. Sinus headache controlled - cetirizine (ZYRTEC) 10 MG tablet; Take 1 tablet (10  mg total) by mouth daily.  Dispense: 30 tablet; Refill: 3   Meds ordered this encounter  Medications  . naproxen (NAPROSYN) 500 MG tablet    Sig: Take 1 tablet (500 mg total) by mouth 2 (two) times daily with a meal.    Dispense:  120 tablet    Refill:  1  . acetaminophen-codeine (TYLENOL #3) 300-30 MG tablet    Sig: Take 1 tablet by mouth every 12 (twelve) hours as needed for moderate pain.    Dispense:  60 tablet    Refill:  1  . metoprolol succinate (TOPROL-XL) 25 MG 24 hr tablet    Sig: Take 0.5 tablets (12.5 mg total) by mouth daily.    Dispense:  45 tablet    Refill:  1    **Patient requests 90 days supply**  . omeprazole (PRILOSEC) 40 MG capsule    Sig: Take 1 capsule (40 mg total)  by mouth daily.    Dispense:  30 capsule    Refill:  3  . cyclobenzaprine (FLEXERIL) 10 MG tablet    Sig: Take 1 tablet (10 mg total) by mouth 2 (two) times daily as needed for muscle spasms.    Dispense:  60 tablet    Refill:  3  . budesonide-formoterol (SYMBICORT) 160-4.5 MCG/ACT inhaler    Sig: Inhale 2 puffs into the lungs 2 (two) times daily.    Dispense:  1 Inhaler    Refill:  3  . atenolol (TENORMIN) 25 MG tablet    Sig: Take 1 tablet (25 mg total) by mouth every morning. Needs office visit for refills    Dispense:  90 tablet    Refill:  1  . cetirizine (ZYRTEC) 10 MG tablet    Sig: Take 1 tablet (10 mg total) by mouth daily.    Dispense:  30 tablet    Refill:  3  . albuterol (PROVENTIL HFA;VENTOLIN HFA) 108 (90 Base) MCG/ACT inhaler    Sig: Inhale 2 puffs into the lungs 2 (two) times daily.    Dispense:  1 Inhaler    Refill:  3    Follow-up: Return in about 3 months (around 12/19/2016) for Follow-up: Chronic medical conditions.   Jaclyn ShaggyEnobong Amao MD

## 2016-09-20 NOTE — Patient Instructions (Signed)

## 2016-09-20 NOTE — Progress Notes (Signed)
Needs refills on pain meds.

## 2016-11-01 ENCOUNTER — Ambulatory Visit: Payer: Medicaid Other | Attending: Physician Assistant | Admitting: Physician Assistant

## 2016-11-01 VITALS — BP 134/86 | HR 70 | Temp 98.1°F | Resp 16

## 2016-11-01 DIAGNOSIS — L500 Allergic urticaria: Secondary | ICD-10-CM | POA: Diagnosis present

## 2016-11-01 DIAGNOSIS — Z79899 Other long term (current) drug therapy: Secondary | ICD-10-CM | POA: Diagnosis not present

## 2016-11-01 MED ORDER — HYDROXYZINE HCL 25 MG PO TABS: 25.0000 mg | ORAL_TABLET | Freq: Four times a day (QID) | ORAL | 0 refills | Status: AC | PRN

## 2016-11-01 MED ORDER — HYDROXYZINE HCL 10 MG PO TABS: 25.0000 mg | ORAL_TABLET | Freq: Four times a day (QID) | ORAL | Status: DC | PRN

## 2016-11-01 MED FILL — hydrOXYzine HCL 25 MG TABS: 25 | 3 days supply | Qty: 12 | Fill #0

## 2016-11-01 MED FILL — ACETAMINOPHEN/COD #3 TABLET: 300-30 | 30 days supply | Qty: 60 | Fill #1

## 2016-11-01 NOTE — Progress Notes (Signed)
genearalized Itching all over for 2 weeks.

## 2016-11-01 NOTE — Progress Notes (Signed)
Patient ID: Jenna LernerBetsy [name], female   DOB: 09/25/1966, 51 y.o.   MRN: [id number]    Subjective:  Patient ID: [name], female    DOB: 05/23/1966  Age: 51 y.o. MRN: [id number]  CC: Itching  HPI Jenna LernerBetsy Nester presents for generalized itching throughout the body for one week. She had noted whealed lesions on her arms and legs but they have since resolved. However, itching continues. Has used an OTC oatmeal cream with no relief. Has not identified any triggers. Denies swelling of the lips/tongue/hands, SOB, chest pain, headache, abdominal pain, f/c/n/v, or GI/GU sxs.  Outpatient Medications Prior to Visit  Medication Sig Dispense Refill  . acetaminophen-codeine (TYLENOL #3) 300-30 MG tablet Take 1 tablet by mouth every 12 (twelve) hours as needed for moderate pain. 60 tablet 1  . albuterol (PROVENTIL HFA;VENTOLIN HFA) 108 (90 Base) MCG/ACT inhaler Inhale 2 puffs into the lungs 2 (two) times daily. 1 Inhaler 3  . atenolol (TENORMIN) 25 MG tablet Take 1 tablet (25 mg total) by mouth every morning. Needs office visit for refills 90 tablet 1  . budesonide-formoterol (SYMBICORT) 160-4.5 MCG/ACT inhaler Inhale 2 puffs into the lungs 2 (two) times daily. 1 Inhaler 3  . cetirizine (ZYRTEC) 10 MG tablet Take 1 tablet (10 mg total) by mouth daily. 30 tablet 3  . cyclobenzaprine (FLEXERIL) 10 MG tablet Take 1 tablet (10 mg total) by mouth 2 (two) times daily as needed for muscle spasms. 60 tablet 3  . metoprolol succinate (TOPROL-XL) 25 MG 24 hr tablet Take 0.5 tablets (12.5 mg total) by mouth daily. 45 tablet 1  . naproxen (NAPROSYN) 500 MG tablet Take 1 tablet (500 mg total) by mouth 2 (two) times daily with a meal. 120 tablet 1  . triamcinolone ointment (KENALOG) 0.5 % Apply 1 application topically 2 (two) times daily. 30 g 0  . omeprazole (PRILOSEC) 40 MG capsule Take 1 capsule (40 mg total) by mouth daily. 30 capsule 3  . diphenhydrAMINE (BENADRYL) 25 MG tablet Take 1 tablet (25 mg total) by mouth  every 6 (six) hours as needed for itching. (Patient not taking: Reported on 09/20/2016) 30 tablet 0   Facility-Administered Medications Prior to Visit  Medication Dose Route Frequency Provider Last Rate Last Dose  . gi cocktail (Maalox,Lidocaine,Donnatal)  30 mL Oral Once Ambrose FinlandValerie A Keck, NP        ROS Review of Systems  Constitutional: Negative for chills, fatigue and fever.  HENT: Negative for facial swelling and trouble swallowing.   Eyes: Negative for itching.  Respiratory: Negative for cough and shortness of breath.   Cardiovascular: Negative for chest pain, palpitations and leg swelling.  Gastrointestinal: Negative for abdominal pain, nausea and vomiting.  Musculoskeletal: Negative for back pain.  Skin: Positive for rash. Negative for wound.  Neurological: Negative for weakness and headaches.    Objective:  BP 134/86 (BP Location: Left Arm, Patient Position: Sitting, Cuff Size: Normal)   Pulse 70   Temp 98.1 F (36.7 C) (Oral)   Resp 16   SpO2 97%   BP/Weight 11/01/2016 09/20/2016 08/01/2016  Systolic BP 134 119 116  Diastolic BP 86 79 74  Wt. (Lbs) - 243.6 241.4  BMI - 41.81 41.44     Physical Exam  Constitutional: She is oriented to person, place, and time.  Scratching arms, morbidly obese, polite  HENT:  Head: Normocephalic and atraumatic.  Eyes: Conjunctivae are normal. No scleral icterus.  Neck: No thyromegaly present.  Cardiovascular: Normal rate, regular rhythm, normal heart sounds  and intact distal pulses.  Exam reveals no gallop and no friction rub.   No murmur heard. Pulmonary/Chest: Effort normal and breath sounds normal. No respiratory distress. She has no wheezes. She has no rales. She exhibits no tenderness.  Abdominal: Soft. Bowel sounds are normal. There is no tenderness.  Musculoskeletal: She exhibits no edema.  Neurological: She is alert and oriented to person, place, and time.  Skin: No rash noted.  Psychiatric: She has a normal mood and  affect. Her behavior is normal. Thought content normal.  Nursing note and vitals reviewed.    Assessment & Plan:   1. Allergic urticaria  Trigger of hives unknown. Patient advised to have allergy testing done if hives persist. I have also counseled patient on non-allergic triggers such as temperature, emotion, or disease. Patient counseled on the drowsy effects of hydroxyzine and to take care not to use if mental clarity and alertness is needed.   Meds ordered this encounter  Medications  . DISCONTD: hydrOXYzine (ATARAX/VISTARIL) tablet 25 mg  . hydrOXYzine (ATARAX/VISTARIL) 25 MG tablet    Sig: Take 1 tablet (25 mg total) by mouth every 6 (six) hours as needed.    Dispense:  12 tablet    Refill:  0    Order Specific Question:   Supervising Provider    Answer:   Quentin Angst [id number]    Follow-up: Return if symptoms worsen or fail to improve.   Loletta Specter PA

## 2016-11-01 NOTE — Patient Instructions (Addendum)
Do not use Hydroxyzine if you are driving or operating machinery as you may likely feel sleepy/drowsy. If you must drive, please reserve on dose for bedtime.  Hives Hives (urticaria) are itchy, red, swollen areas on your skin. Hives can appear on any part of your body and can vary in size. They can be as small as the tip of a pen or much larger. Hives often fade within 24 hours (acute hives). In other cases, new hives appear after old ones fade. This cycle can continue for several days or weeks (chronic hives). Hives result from your body's reaction to an irritant or to something that you are allergic to (trigger). When you are exposed to a trigger, your body releases a chemical (histamine) that causes redness, itching, and swelling. You can get hives immediately after being exposed to a trigger or hours later. Hives do not spread from person to person (are not contagious). Your hives may get worse with scratching, exercise, and emotional stress. What are the causes? Causes of this condition include:  Allergies to certain foods or ingredients.  Insect bites or stings.  Exposure to pollen or pet dander.  Contact with latex or chemicals.  Spending time in sunlight, heat, or cold (exposure).  Exercise.  Stress. You can also get hives from some medical conditions and treatments. These include:  Viruses, including the common cold.  Bacterial infections, such as urinary tract infections and strep throat.  Disorders such as vasculitis, lupus, or thyroid disease.  Certain medications.  Allergy shots.  Blood transfusions. Sometimes, the cause of hives is not known (idiopathic hives). What increases the risk? This condition is more likely to develop in:  Women.  People who have food allergies, especially to citrus fruits, milk, eggs, peanuts, tree nuts, or shellfish.  People who are allergic to:  Medicines.  Latex.  Insects.  Animals.  Pollen.  People who have certain  medical conditions, includinglupus or thyroid disease. What are the signs or symptoms? The main symptom of this condition is raised, itchyred or white bumps or patches on your skin. These areas may:  Become large and swollen (welts).  Change in shape and location, quickly and repeatedly.  Be separate hives or connect over a large area of skin.  Sting or become painful.  Turn white when pressed in the center (blanch). In severe cases, yourhands, feet, and face may also become swollen. This may occur if hives develop deeper in your skin. How is this diagnosed? This condition is diagnosed based on your symptoms, medical history, and physical exam. Your skin, urine, or blood may be tested to find out what is causing your hives and to rule out other health issues. Your health care provider may also remove a small sample of skin from the affected area and examine it under a microscope (biopsy). How is this treated? Treatment depends on the severity of your condition. Your health care provider may recommend using cool, wet cloths (cool compresses) or taking cool showers to relieve itching. Hives are sometimes treated with medicines, including:  Antihistamines.  Corticosteroids.  Antibiotics.  An injectable medicine (omalizumab). Your health care provider may prescribe this if you have chronic idiopathic hives and you continue to have symptoms even after treatment with antihistamines. Severe cases may require an emergency injection of adrenaline (epinephrine) to prevent a life-threatening allergic reaction (anaphylaxis). Follow these instructions at home: Medicines  Take or apply over-the-counter and prescription medicines only as told by your health care provider.  If you were  prescribed an antibiotic medicine, use it as told by your health care provider. Do not stop taking the antibiotic even if you start to feel better. Skin Care  Apply cool compresses to the affected areas.  Do  not scratch or rub your skin. General instructions  Do not take hot showers or baths. This can make itching worse.  Do not wear tight-fitting clothing.  Use sunscreen and wear protective clothing when you are outside.  Avoid any substances that cause your hives. Keep a journal to help you track what causes your hives. Write down:  What medicines you take.  What you eat and drink.  What products you use on your skin.  Keep all follow-up visits as told by your health care provider. This is important. Contact a health care provider if:  Your symptoms are not controlled with medicine.  Your joints are painful or swollen. Get help right away if:  You have a fever.  You have pain in your abdomen.  Your tongue or lips are swollen.  Your eyelids are swollen.  Your chest or throat feels tight.  You have trouble breathing or swallowing. These symptoms may represent a serious problem that is an emergency. Do not wait to see if the symptoms will go away. Get medical help right away. Call your local emergency services (911 in the [place] Do not drive yourself to the hospital.  This information is not intended to replace advice given to you by your health care provider. Make sure you discuss any questions you have with your health care provider. Document Released: 09/26/2005 Document Revised: 02/24/2016 Document Reviewed: 07/15/2015 Elsevier Interactive Patient Education  2017 [employer]

## 2016-11-14 ENCOUNTER — Other Ambulatory Visit (INDEPENDENT_AMBULATORY_CARE_PROVIDER_SITE_OTHER): Payer: Self-pay | Admitting: Physician Assistant

## 2016-11-14 ENCOUNTER — Encounter: Payer: Self-pay | Admitting: Physician Assistant

## 2016-11-14 ENCOUNTER — Ambulatory Visit: Payer: Medicaid Other | Attending: Physician Assistant | Admitting: Physician Assistant

## 2016-11-14 ENCOUNTER — Telehealth: Payer: Self-pay | Admitting: Family Medicine

## 2016-11-14 VITALS — BP 137/90 | HR 65 | Temp 98.4°F | Resp 16 | Wt 241.0 lb

## 2016-11-14 DIAGNOSIS — Z79899 Other long term (current) drug therapy: Secondary | ICD-10-CM | POA: Diagnosis not present

## 2016-11-14 DIAGNOSIS — E669 Obesity, unspecified: Secondary | ICD-10-CM | POA: Insufficient documentation

## 2016-11-14 DIAGNOSIS — L509 Urticaria, unspecified: Secondary | ICD-10-CM | POA: Diagnosis present

## 2016-11-14 DIAGNOSIS — R300 Dysuria: Secondary | ICD-10-CM | POA: Insufficient documentation

## 2016-11-14 DIAGNOSIS — Z6841 Body Mass Index (BMI) 40.0 and over, adult: Secondary | ICD-10-CM | POA: Insufficient documentation

## 2016-11-14 DIAGNOSIS — I10 Essential (primary) hypertension: Secondary | ICD-10-CM | POA: Diagnosis not present

## 2016-11-14 LAB — CBC WITH DIFFERENTIAL/PLATELET
Basophils Absolute: 64 cells/uL (ref 0–200)
Basophils Relative: 1 %
Eosinophils Absolute: 128 cells/uL (ref 15–500)
Eosinophils Relative: 2 %
HCT: 38.6 % (ref 35.0–45.0)
Hemoglobin: 12.6 g/dL (ref 11.7–15.5)
Lymphocytes Relative: 32 %
Lymphs Abs: 2048 cells/uL (ref 850–3900)
MCH: 27.2 pg (ref 27.0–33.0)
MCHC: 32.6 g/dL (ref 32.0–36.0)
MCV: 83.2 fL (ref 80.0–100.0)
MPV: 9.5 fL (ref 7.5–12.5)
Monocytes Absolute: 448 cells/uL (ref 200–950)
Monocytes Relative: 7 %
Neutro Abs: 3712 cells/uL (ref 1500–7800)
Neutrophils Relative %: 58 %
Platelets: 294 10*3/uL (ref 140–400)
RBC: 4.64 MIL/uL (ref 3.80–5.10)
RDW: 14.2 % (ref 11.0–15.0)
WBC: 6.4 10*3/uL (ref 3.8–10.8)

## 2016-11-14 LAB — POCT URINALYSIS DIPSTICK
Bilirubin, UA: NEGATIVE
Glucose, UA: NEGATIVE
Ketones, UA: NEGATIVE
Leukocytes, UA: NEGATIVE
Nitrite, UA: NEGATIVE
Protein, UA: NEGATIVE
Spec Grav, UA: 1.015
Urobilinogen, UA: 0.2
pH, UA: 5

## 2016-11-14 LAB — COMPREHENSIVE METABOLIC PANEL
ALT: 53 U/L — ABNORMAL HIGH (ref 6–29)
AST: 22 U/L (ref 10–35)
Albumin: 4.1 g/dL (ref 3.6–5.1)
Alkaline Phosphatase: 79 U/L (ref 33–130)
BUN: 8 mg/dL (ref 7–25)
CO2: 28 mmol/L (ref 20–31)
Calcium: 9.4 mg/dL (ref 8.6–10.4)
Chloride: 107 mmol/L (ref 98–110)
Creat: 0.54 mg/dL (ref 0.50–1.05)
Glucose, Bld: 78 mg/dL (ref 65–99)
Potassium: 3.5 mmol/L (ref 3.5–5.3)
Sodium: 144 mmol/L (ref 135–146)
Total Bilirubin: 0.6 mg/dL (ref 0.2–1.2)
Total Protein: 6.7 g/dL (ref 6.1–8.1)

## 2016-11-14 LAB — TSH: TSH: 1.7 mIU/L

## 2016-11-14 MED ORDER — TRIAMCINOLONE ACETONIDE 0.5 % EX CREA: 1.0000 "application " | TOPICAL_CREAM | Freq: Two times a day (BID) | CUTANEOUS | 0 refills | Status: AC

## 2016-11-14 MED ORDER — CETIRIZINE HCL 10 MG PO TABS: 10.0000 mg | ORAL_TABLET | Freq: Every day | ORAL | 0 refills | Status: DC

## 2016-11-14 MED ORDER — CETIRIZINE HCL 10 MG PO TABS: 10.0000 mg | ORAL_TABLET | Freq: Two times a day (BID) | ORAL | 0 refills | Status: DC

## 2016-11-14 NOTE — Patient Instructions (Signed)
1. Hives -per patient history. Unable to discern as there are no active lesions on patient's skin. There are, however, some postinflammatory hyperpigmented macules on neck and arms. We will conduct laboratory testing and assess efficacy of treatment. If no response, then she will be referred to dermatology. - CBC with Differential - Comprehensive metabolic panel - TSH - Allergy profile region II-DC, [place], [place], [place], [place] - cetirizine (ZYRTEC) 10 MG tablet; Take 1 tablet (10 mg total) by mouth 2 (two) times daily.  Dispense: 14 tablet; Refill: 0 - triamcinolone cream (KENALOG) 0.5 %; Apply 1 application topically 2 (two) times daily.  Dispense: 14 g; Refill: 0  2. Dysuria -negative leukocytes and nitrites on dipstick. Trace blood present. Will recheck at follow up in one month. - Urinalysis Dipstick

## 2016-11-14 NOTE — Telephone Encounter (Signed)
[facility] called needs clarification on cetirizine 10mg  , should it be taken twice a day.

## 2016-11-14 NOTE — Progress Notes (Signed)
Subjective:  Patient ID: Jenna LernerBetsy [name], female    DOB: 07/01/1966  Age: 51 y.o. MRN: [id number]  CC: itching.  HPI [name] is a 51 y.o. female with a PMH of   Past Medical History:  Diagnosis Date  . Asthma   . Gastric reflux   . History of blood transfusion yrs agio, none recent  . Hypertension   . Osteoarthritis   . Sleep apnea    cpap machine has not used since 2014 due to machine/tubing in bad shape   . Syncope and collapse yrs ago    that presents with continued itching and "hives" since last week. She was prescribed hydroxyzine and states itching and hives subsided somewhat. She acknowledges the hives became worse when she finished the hydroxyzine. She also used OTC Hydrocortisone that seemed to help "a little". She adds that she has had some dysuria and urinary frequency as of late. Thinks the urinary symptoms are linked to her hives. Denies exposure to fleas, ticks, chiggers, bed bugs. Denies angioedema, f/c/n/v, chest pain, SOB, wheezing, abdominal pain, or headache.    Outpatient Medications Prior to Visit  Medication Sig Dispense Refill  . acetaminophen-codeine (TYLENOL #3) 300-30 MG tablet Take 1 tablet by mouth every 12 (twelve) hours as needed for moderate pain. 60 tablet 1  . albuterol (PROVENTIL HFA;VENTOLIN HFA) 108 (90 Base) MCG/ACT inhaler Inhale 2 puffs into the lungs 2 (two) times daily. 1 Inhaler 3  . atenolol (TENORMIN) 25 MG tablet Take 1 tablet (25 mg total) by mouth every morning. Needs office visit for refills 90 tablet 1  . budesonide-formoterol (SYMBICORT) 160-4.5 MCG/ACT inhaler Inhale 2 puffs into the lungs 2 (two) times daily. 1 Inhaler 3  . cyclobenzaprine (FLEXERIL) 10 MG tablet Take 1 tablet (10 mg total) by mouth 2 (two) times daily as needed for muscle spasms. 60 tablet 3  . metoprolol succinate (TOPROL-XL) 25 MG 24 hr tablet Take 0.5 tablets (12.5 mg total) by mouth daily. 45 tablet 1  . naproxen (NAPROSYN) 500 MG tablet Take 1 tablet  (500 mg total) by mouth 2 (two) times daily with a meal. 120 tablet 1  . cetirizine (ZYRTEC) 10 MG tablet Take 1 tablet (10 mg total) by mouth daily. 30 tablet 3  . triamcinolone ointment (KENALOG) 0.5 % Apply 1 application topically 2 (two) times daily. 30 g 0  . omeprazole (PRILOSEC) 40 MG capsule Take 1 capsule (40 mg total) by mouth daily. 30 capsule 3   Facility-Administered Medications Prior to Visit  Medication Dose Route Frequency Provider Last Rate Last Dose  . gi cocktail (Maalox,Lidocaine,Donnatal)  30 mL Oral Once Ambrose FinlandValerie A Keck, NP         ROS Review of Systems  Constitutional: Negative for chills, fever and malaise/fatigue.  Eyes: Negative for blurred vision.  Respiratory: Negative for shortness of breath.   Cardiovascular: Negative for chest pain and palpitations.  Gastrointestinal: Negative for abdominal pain and nausea.  Genitourinary: Positive for dysuria and frequency. Negative for hematuria.  Musculoskeletal: Negative for joint pain and myalgias.  Skin: Positive for itching and rash.  Neurological: Negative for tingling and headaches.  Psychiatric/Behavioral: Negative for depression. The patient is not nervous/anxious.     Objective:  BP 137/90 (BP Location: Left Arm, Patient Position: Sitting, Cuff Size: Normal)   Pulse 65   Temp 98.4 F (36.9 C) (Oral)   Resp 16   Wt 241 lb (109.3 kg)   SpO2 97%   BMI 41.37 kg/m   BP/Weight  11/14/2016 11/01/2016 09/20/2016  Systolic BP 137 134 119  Diastolic BP 90 86 79  Wt. (Lbs) 241 - 243.6  BMI 41.37 - 41.81      Physical Exam  Constitutional: She is oriented to person, place, and time.  NAD, occassional scratching of arms, obese, polite  HENT:  Head: Normocephalic and atraumatic.  Eyes: Conjunctivae are normal. Scleral icterus is present.  Neck: No thyromegaly present.  Cardiovascular: Normal rate, regular rhythm and normal heart sounds.   Pulmonary/Chest: Effort normal and breath sounds normal.  Abdominal:  Soft. Bowel sounds are normal. There is no tenderness.  Musculoskeletal: She exhibits no edema.  Neurological: She is alert and oriented to person, place, and time.  Skin: Skin is warm and dry. No rash noted. No erythema.  Some faint postinflammatory hyperpigmented round macules on back of neck and on arms ranging in size from approximately 0.52mm and 10mm.  Psychiatric: She has a normal mood and affect. Her behavior is normal.     Assessment & Plan:   1. Hives -per patient history. Unable to discern as there are no active lesions on patient's skin. There are, however, some postinflammatory hyperpigmented macules on neck and arms. We will conduct laboratory testing and assess efficacy of treatment. If no response, then she will be referred to dermatology. - CBC with Differential - Comprehensive metabolic panel - TSH - Allergy profile region II-DC, [place], [place], [place], [place] - cetirizine (ZYRTEC) 10 MG tablet; Take 1 tablet (10 mg total) by mouth 2 (two) times daily.  Dispense: 14 tablet; Refill: 0 - triamcinolone cream (KENALOG) 0.5 %; Apply 1 application topically 2 (two) times daily.  Dispense: 14 g; Refill: 0  2. Dysuria -negative leukocytes and nitrites on dipstick. Trace blood present. Will recheck at follow up in one month. - Urinalysis Dipstick   Meds ordered this encounter  Medications  . cetirizine (ZYRTEC) 10 MG tablet    Sig: Take 1 tablet (10 mg total) by mouth 2 (two) times daily.    Dispense:  14 tablet    Refill:  0    Order Specific Question:   Supervising Provider    Answer:   Quentin Angst [id number]  . triamcinolone cream (KENALOG) 0.5 %    Sig: Apply 1 application topically 2 (two) times daily.    Dispense:  14 g    Refill:  0    Order Specific Question:   Supervising Provider    Answer:   Quentin Angst [id number]    Follow-up: Return in about 5 weeks (around 12/19/2016) for HTN.   Loletta Specter PA

## 2016-11-14 NOTE — Telephone Encounter (Signed)
Changed to daily cetirizine

## 2016-11-15 LAB — RESPIRATORY ALLERGY PROFILE REGION II ~~LOC~~
Allergen, A. alternata, m6: <0.1 kU/L
Allergen, C. Herbarum, M2: <0.1 kU/L
Allergen, Cedar tree, t12: <0.1 kU/L
Allergen, Comm Silver Birch, t9: <0.1 kU/L
Allergen, Cottonwood, t14: <0.1 kU/L
Allergen, D pternoyssinus,d7: <0.1 kU/L
Allergen, Mouse Urine Protein, e78: <0.1 kU/L
Allergen, Mulberry, t76: <0.1 kU/L
Allergen, Oak,t7: <0.1 kU/L
Allergen, P. notatum, m1: <0.1 kU/L
Aspergillus fumigatus, m3: <0.1 kU/L
Bermuda Grass: <0.1 kU/L
Box Elder IgE: <0.1 kU/L
Cat Dander: <0.1 kU/L
Cockroach: <0.1 kU/L
Common Ragweed: <0.1 kU/L
D. farinae: <0.1 kU/L
Dog Dander: <0.1 kU/L
Elm IgE: <0.1 kU/L
IgE (Immunoglobulin E), Serum: 36 kU/L (ref <115)
Johnson Grass: <0.1 kU/L
Pecan/Hickory Tree IgE: <0.1 kU/L
Rough Pigweed  IgE: <0.1 kU/L
Sheep Sorrel IgE: <0.1 kU/L
Timothy Grass: <0.1 kU/L

## 2016-11-16 ENCOUNTER — Telehealth: Payer: Self-pay | Admitting: Family Medicine

## 2016-11-16 NOTE — Telephone Encounter (Signed)
Pt calling to follow up on blood work from last OV. Pt was informed results are not in yet  Pt also states the hives she came in for on 2/5 are reappearing and is concerned

## 2016-11-16 NOTE — Telephone Encounter (Signed)
Patient should continue to take medications as directed. She will likely have to go to dermatology but we have to wait for the last result to come in.

## 2016-11-23 NOTE — Telephone Encounter (Signed)
Patient is calling to follow up on results regarding her last office visit. She states the bumps are returning.  Please advised

## 2016-11-24 NOTE — Telephone Encounter (Signed)
Pt. Called requesting her results from her last OV.  Please f/u

## 2016-11-24 NOTE — Telephone Encounter (Signed)
Please tell patient her CBC, Allergen zone 2 profile, and TSH are normal. Kidney function normal. Mild liver enzyme elevation should get more work up. Still no definitive answer to the cause of her itching and "hives".

## 2016-11-25 NOTE — Telephone Encounter (Signed)
Spoke to patient. Aware to f/u with PCP for further work up on elevated liver enzymes. Appointment scheduled. Pt aware.

## 2016-11-29 ENCOUNTER — Encounter: Payer: Self-pay | Admitting: Family Medicine

## 2016-11-29 ENCOUNTER — Ambulatory Visit: Payer: Medicaid Other | Attending: Family Medicine | Admitting: Family Medicine

## 2016-11-29 VITALS — BP 109/71 | HR 78 | Temp 98.4°F | Ht 64.0 in | Wt 238.0 lb

## 2016-11-29 DIAGNOSIS — L509 Urticaria, unspecified: Secondary | ICD-10-CM | POA: Diagnosis not present

## 2016-11-29 DIAGNOSIS — Z79899 Other long term (current) drug therapy: Secondary | ICD-10-CM | POA: Insufficient documentation

## 2016-11-29 DIAGNOSIS — K219 Gastro-esophageal reflux disease without esophagitis: Secondary | ICD-10-CM | POA: Diagnosis not present

## 2016-11-29 DIAGNOSIS — R748 Abnormal levels of other serum enzymes: Secondary | ICD-10-CM | POA: Diagnosis present

## 2016-11-29 DIAGNOSIS — J45909 Unspecified asthma, uncomplicated: Secondary | ICD-10-CM | POA: Diagnosis not present

## 2016-11-29 DIAGNOSIS — Z6841 Body Mass Index (BMI) 40.0 and over, adult: Secondary | ICD-10-CM | POA: Insufficient documentation

## 2016-11-29 DIAGNOSIS — R740 Nonspecific elevation of levels of transaminase and lactic acid dehydrogenase [LDH]: Secondary | ICD-10-CM | POA: Diagnosis not present

## 2016-11-29 DIAGNOSIS — I10 Essential (primary) hypertension: Secondary | ICD-10-CM | POA: Insufficient documentation

## 2016-11-29 DIAGNOSIS — R7401 Elevation of levels of liver transaminase levels: Secondary | ICD-10-CM

## 2016-11-29 MED ORDER — PROMETHAZINE HCL 25 MG PO TABS: 25.0000 mg | ORAL_TABLET | Freq: Two times a day (BID) | ORAL | 0 refills | Status: DC | PRN

## 2016-11-29 NOTE — Progress Notes (Signed)
Subjective:    Patient ID: [name], female    DOB: [birth year], 51 y.o.   MRN: [id number]  HPI 51 year old [place] American female with a past medical history significant for HTN, GERD and asthma. Presents today for a follow up on her elevated liver enzyme.  Her blood pressure is controled, denies chest pain.Has not had any exacerbation of Asthma, rarely have to use her albuterol inhaler. She is on Prilosec for GERD but has been having nausea and vomiting with solid meals, she is able to tolerated soups and liquids. Symptom started a week ago. Today, she has a complain of hives on bilateral arms. The symptom started last month and is associated with itch and welts. Nothing aggravates it, but is relieved temporarily when she takes Benadryl. She denies any new exposure to new environment, food, or clothing. She denies exposure to bed bug. She denies shortness breath, fever or chills. She denies Alcohol consumption or tobacco product use. She denies everyday use of Acetaminophen, she report taking about one tablet a day on average. She denies history of IV drug use, denies [place] travel. She has not have immunization against Hep B. Past Medical History:  Diagnosis Date  . Asthma   . Gastric reflux   . History of blood transfusion yrs agio, none recent  . Hypertension   . Osteoarthritis   . Sleep apnea    cpap machine has not used since 2014 due to machine/tubing in bad shape   . Syncope and collapse yrs ago   Current Outpatient Prescriptions on File Prior to Visit  Medication Sig Dispense Refill  . acetaminophen-codeine (TYLENOL #3) 300-30 MG tablet Take 1 tablet by mouth every 12 (twelve) hours as needed for moderate pain. 60 tablet 1  . albuterol (PROVENTIL HFA;VENTOLIN HFA) 108 (90 Base) MCG/ACT inhaler Inhale 2 puffs into the lungs 2 (two) times daily. 1 Inhaler 3  . atenolol (TENORMIN) 25 MG tablet Take 1 tablet (25 mg total) by mouth every morning. Needs office visit for refills 90  tablet 1  . budesonide-formoterol (SYMBICORT) 160-4.5 MCG/ACT inhaler Inhale 2 puffs into the lungs 2 (two) times daily. 1 Inhaler 3  . cetirizine (ZYRTEC) 10 MG tablet Take 1 tablet (10 mg total) by mouth at bedtime. 7 tablet 0  . cyclobenzaprine (FLEXERIL) 10 MG tablet Take 1 tablet (10 mg total) by mouth 2 (two) times daily as needed for muscle spasms. 60 tablet 3  . metoprolol succinate (TOPROL-XL) 25 MG 24 hr tablet Take 0.5 tablets (12.5 mg total) by mouth daily. 45 tablet 1  . naproxen (NAPROSYN) 500 MG tablet Take 1 tablet (500 mg total) by mouth 2 (two) times daily with a meal. 120 tablet 1  . omeprazole (PRILOSEC) 40 MG capsule Take 1 capsule (40 mg total) by mouth daily. 30 capsule 3   Current Facility-Administered Medications on File Prior to Visit  Medication Dose Route Frequency Provider Last Rate Last Dose  . gi cocktail (Maalox,Lidocaine,Donnatal)  30 mL Oral Once Ambrose Finland, NP        Allergies  Allergen Reactions  . No Known Allergies    Social History   Social History  . Marital status: Single    Spouse name: N/A  . Number of children: N/A  . Years of education: N/A   Occupational History  . Not on file.   Social History Main Topics  . Smoking status: Never Smoker  . Smokeless tobacco: Never Used  . Alcohol use No  .  Drug use: No  . Sexual activity: No   Other Topics Concern  . Not on file   Social History Narrative  . No narrative on file    Review of Systems  Constitutional: Positive for fatigue. Negative for chills, fever and unexpected weight change.  HENT: Negative for congestion and sore throat.   Eyes: Negative for photophobia and visual disturbance.  Respiratory: Negative for cough, chest tightness and shortness of breath.   Cardiovascular: Negative for chest pain, palpitations and leg swelling.  Gastrointestinal: Positive for nausea and vomiting. Negative for abdominal pain, blood in stool, constipation and diarrhea.  Endocrine:  Positive for heat intolerance, polydipsia and polyphagia. Negative for polyuria.  Genitourinary: Negative for dysuria and hematuria.  Musculoskeletal: Positive for back pain. Negative for arthralgias, joint swelling and myalgias.  Skin: Positive for rash. Negative for color change, pallor and wound.  Neurological: Positive for light-headedness. Negative for syncope, weakness and headaches.  Psychiatric/Behavioral: Positive for sleep disturbance (insomnia).       Objective: BP 109/71 (BP Location: Right Arm, Patient Position: Sitting, Cuff Size: Large)   Pulse 78   Temp 98.4 F (36.9 C) (Oral)   Ht 5\' 4"  (1.626 m)   Wt 238 lb (108 kg)   SpO2 99%   BMI 40.85 kg/m   [facility] 11/14/2016 08/01/2016 06/23/2016 05/23/2016 05/04/2016  Decreased Interest 1 0 0 3 3  Down, Depressed, Hopeless 0 0 0 0 0  PHQ - 2 Score 1 0 0 3 3  Altered sleeping 0 - - 2 3  Tired, decreased energy 1 - - 3 3  Change in appetite 0 - - - 3  Feeling bad or failure about yourself  0 - - 0 0  Trouble concentrating 0 - - 3 0  Moving slowly or fidgety/restless 0 - - - 0  Suicidal thoughts 0 - - 0 0  PHQ-9 Score 2 - - 11 12      Physical Exam  Constitutional: She is oriented to person, place, and time. She appears well-nourished. No distress.  Morbidly obese  HENT:  Head: Normocephalic.  Mouth/Throat: Oropharynx is clear and moist.  Eyes: Conjunctivae and EOM are normal. Pupils are equal, round, and reactive to light. No scleral icterus.  Neck: Normal range of motion. Neck supple. No thyromegaly present.  Cardiovascular: Normal rate and regular rhythm.   Murmur heard. Pulmonary/Chest: Effort normal and breath sounds normal. She has no wheezes.  Abdominal: Soft. Bowel sounds are normal. She exhibits no distension. There is no tenderness.  Musculoskeletal: Normal range of motion. She exhibits no edema.  Lymphadenopathy:    She has no cervical adenopathy.  Neurological: She is alert and oriented  to person, place, and time.  Skin: Skin is warm and dry.  Non tender swollen erymathous bumps scattered on arms bilaterally, non noted anywhere else  Psychiatric: She has a normal mood and affect. Her behavior is normal.      Assessment & Plan:  1. Gastric reflux Nausea could be from reflux, will treat symptom and re-evaluate for EGD if symptom persists. Avoid spicy foods Do not eat 2 hours prior to bedtime - promethazine (PHENERGAN) 25 MG tablet; Take 1 tablet (25 mg total) by mouth every 12 (twelve) hours as needed for nausea or vomiting.  Dispense: 20 tablet; Refill: 0  2. Essential hypertension Currently controlled. Low salt diet discussed and encouraged  3. Elevated ALT measurement    ALT could be elevated from a variety of problems, origin  is not known for this patient. Will check Hep panel and GGT. - Hepatitis panel, acute - Gamma GT  4. Hives This is a recurrent problem for this patient. Plan is to check if it is medication related by elimination method. Patient advised to stop all her medicines for 4 days. She is to restart only her metoprolol in 5 days. Patient to return to clinic in 3 weeks to reevaluate for further work up.    Labs pending Health maintenance reviewed Diet and exercise encouraged  Follow up  in 3 weeks  Reuel Boom, RN, BSN, AGNP-Student

## 2016-11-30 LAB — GAMMA GT: GGT: 65 U/L — ABNORMAL HIGH (ref 7–51)

## 2016-11-30 LAB — HEPATITIS PANEL, ACUTE
HCV Ab: NEGATIVE
Hep A IgM: NONREACTIVE
Hep B C IgM: NONREACTIVE
Hepatitis B Surface Ag: NEGATIVE

## 2016-12-02 ENCOUNTER — Telehealth: Payer: Self-pay | Admitting: Family Medicine

## 2016-12-02 NOTE — Telephone Encounter (Signed)
Medical Assistant left message on patient's home and cell voicemail. Voicemail states to give a call back to Shakeila Pfarr with [facility] at [phone].  

## 2016-12-02 NOTE — Telephone Encounter (Signed)
Patient verified DOB Patient is aware of lab results being normal from her visit with Dr. Venetia NightAmao. Patient states hives are present but denies any fever or SOB at this time. Patient advised to follow the instructions provided by the PCP. Patient advised to not take medications for 4 days (today is day 3). Patient aware of only restarting metoprolol on the 5th day (Sunday). Patient advised to take metoprolol for 5 consecutive days and return for a FU visit in 3 weeks. Patient advised to call on the Friday after taking the medication for the 5 days and report an update. Patient will then be scheduled for the 3 week FU. MA informed patient of being aware of swelling in the throat and fever being present to contact the office immediately. Patient expressed her understanding and had no further questions.

## 2016-12-02 NOTE — Telephone Encounter (Signed)
-----   Message from Jaclyn ShaggyEnobong Amao, MD sent at 11/30/2016 12:46 PM EST ----- Labs are stable

## 2016-12-02 NOTE — Telephone Encounter (Signed)
Patient called the office to speak with nurse regarding the hives. Pt stated that she is noticing that more are appearing. Pt is concerned. Please follow up.   Thank you.

## 2016-12-09 ENCOUNTER — Ambulatory Visit: Payer: Medicaid Other | Attending: Family Medicine | Admitting: Family Medicine

## 2016-12-09 ENCOUNTER — Encounter: Payer: Self-pay | Admitting: Family Medicine

## 2016-12-09 VITALS — BP 113/79 | HR 85 | Temp 97.9°F | Resp 18 | Ht 64.0 in | Wt 232.6 lb

## 2016-12-09 DIAGNOSIS — J40 Bronchitis, not specified as acute or chronic: Secondary | ICD-10-CM | POA: Diagnosis not present

## 2016-12-09 DIAGNOSIS — J069 Acute upper respiratory infection, unspecified: Secondary | ICD-10-CM | POA: Diagnosis present

## 2016-12-09 DIAGNOSIS — Z79899 Other long term (current) drug therapy: Secondary | ICD-10-CM | POA: Insufficient documentation

## 2016-12-09 DIAGNOSIS — J329 Chronic sinusitis, unspecified: Secondary | ICD-10-CM | POA: Diagnosis not present

## 2016-12-09 DIAGNOSIS — J4541 Moderate persistent asthma with (acute) exacerbation: Secondary | ICD-10-CM | POA: Diagnosis not present

## 2016-12-09 MED ORDER — PREDNISONE 20 MG PO TABS: 20.0000 mg | ORAL_TABLET | Freq: Two times a day (BID) | ORAL | 0 refills | Status: DC

## 2016-12-09 MED ORDER — AZITHROMYCIN 250 MG PO TABS: ORAL_TABLET | ORAL | 0 refills | Status: DC

## 2016-12-09 NOTE — Progress Notes (Signed)
Subjective:  Patient ID: [name], female    DOB: 02/18/1966  Age: 51 y.o. MRN: [id number]  CC: URI   HPI [name] is a 51 year old female with history of hypertension, asthma who presents for an acute visit complaining of a one and a half week history of lightheadedness, nausea, decreased energy and decreased appetite. She also complains of left frontal sinus pain and left ear pain. Has had associated shortness of breath and postnasal drip but denies any history of fever; she has an occasional cough. She goes to day program and states that looks of people they have been sick.  The Hives she complained of that her previous visit have resolved.  Past Medical History:  Diagnosis Date  . Asthma   . Gastric reflux   . History of blood transfusion yrs agio, none recent  . Hypertension   . Osteoarthritis   . Sleep apnea    cpap machine has not used since 2014 due to machine/tubing in bad shape   . Syncope and collapse yrs ago    Past Surgical History:  Procedure Laterality Date  . CARDIAC CATHETERIZATION     more than 5 years ago  . CARDIAC CATHETERIZATION  06-07-2012   [facility]: Normal coronary arteries. No significant LVOT/aortic valve gradient  . CHOLECYSTECTOMY    . COLONOSCOPY WITH PROPOFOL N/A 02/27/2014   Procedure: COLONOSCOPY WITH PROPOFOL;  Surgeon: Charolett BumpersMartin K Johnson, MD;  Location: [facility];  Service: Endoscopy;  Laterality: N/A;  . ESOPHAGOGASTRODUODENOSCOPY (EGD) WITH PROPOFOL N/A 02/27/2014   Procedure: ESOPHAGOGASTRODUODENOSCOPY (EGD) WITH PROPOFOL;  Surgeon: Charolett BumpersMartin K Johnson, MD;  Location: [facility];  Service: Endoscopy;  Laterality: N/A;  . EXPLORATORY LAPAROTOMY  09/2001   ELAP, LOA, R partial oophorectomy, repair of bowel injury  . VAGINAL HYSTERECTOMY  2001   Adnexa left in place    Allergies  Allergen Reactions  . No Known Allergies      Outpatient Medications Prior to Visit  Medication Sig Dispense Refill  . acetaminophen-codeine (TYLENOL  #3) 300-30 MG tablet Take 1 tablet by mouth every 12 (twelve) hours as needed for moderate pain. 60 tablet 1  . albuterol (PROVENTIL HFA;VENTOLIN HFA) 108 (90 Base) MCG/ACT inhaler Inhale 2 puffs into the lungs 2 (two) times daily. 1 Inhaler 3  . budesonide-formoterol (SYMBICORT) 160-4.5 MCG/ACT inhaler Inhale 2 puffs into the lungs 2 (two) times daily. 1 Inhaler 3  . cyclobenzaprine (FLEXERIL) 10 MG tablet Take 1 tablet (10 mg total) by mouth 2 (two) times daily as needed for muscle spasms. 60 tablet 3  . metoprolol succinate (TOPROL-XL) 25 MG 24 hr tablet Take 0.5 tablets (12.5 mg total) by mouth daily. 45 tablet 1  . naproxen (NAPROSYN) 500 MG tablet Take 1 tablet (500 mg total) by mouth 2 (two) times daily with a meal. 120 tablet 1  . promethazine (PHENERGAN) 25 MG tablet Take 1 tablet (25 mg total) by mouth every 12 (twelve) hours as needed for nausea or vomiting. 20 tablet 0  . cetirizine (ZYRTEC) 10 MG tablet Take 1 tablet (10 mg total) by mouth at bedtime. 7 tablet 0  . omeprazole (PRILOSEC) 40 MG capsule Take 1 capsule (40 mg total) by mouth daily. 30 capsule 3   Facility-Administered Medications Prior to Visit  Medication Dose Route Frequency Provider Last Rate Last Dose  . gi cocktail (Maalox,Lidocaine,Donnatal)  30 mL Oral Once Ambrose FinlandValerie A Keck, NP        ROS Review of Systems  Constitutional: Negative for activity change  and appetite change.  HENT: Positive for postnasal drip and sinus pain. Negative for sore throat.   Respiratory: Positive for cough. Negative for chest tightness, shortness of breath and wheezing.   Cardiovascular: Negative for chest pain and palpitations.  Gastrointestinal: Negative for abdominal distention, abdominal pain and constipation.  Genitourinary: Negative.   Musculoskeletal: Negative.   Psychiatric/Behavioral: Negative for behavioral problems and dysphoric mood.    Objective:  BP 113/79 (BP Location: Right Arm, Patient Position: Sitting, Cuff Size:  Large)   Pulse 85   Temp 97.9 F (36.6 C) (Oral)   Resp 18   Ht 5\' 4"  (1.626 m)   Wt 232 lb 9.6 oz (105.5 kg)   SpO2 98%   BMI 39.93 kg/m   BP/Weight 12/09/2016 11/29/2016 11/14/2016  Systolic BP 113 109 137  Diastolic BP 79 71 90  Wt. (Lbs) 232.6 238 241  BMI 39.93 40.85 41.37      Physical Exam  Constitutional: She is oriented to person, place, and time. She appears well-developed and well-nourished.  HENT:  Left frontal sinus tenderness Left pretracheal tenderness.  Cerumen obscuring L TM  Cardiovascular: Normal rate and intact distal pulses.   Murmur heard. Pulmonary/Chest: Effort normal. She has wheezes. She has no rales. She exhibits no tenderness.  Abdominal: Soft. Bowel sounds are normal. She exhibits no distension and no mass. There is no tenderness.  Musculoskeletal: Normal range of motion.  Neurological: She is alert and oriented to person, place, and time.     Assessment & Plan:   1. Moderate persistent asthma with exacerbation Acute exacerbation driven by sinusitis - predniSONE (DELTASONE) 20 MG tablet; Take 1 tablet (20 mg total) by mouth 2 (two) times daily with a meal.  Dispense: 10 tablet; Refill: 0  2. Bronchitis - azithromycin (ZITHROMAX) 250 MG tablet; Take 2 tablets (500 mg) on day 1 then 1 tablet (250 mg) on days 2-5  Dispense: 6 tablet; Refill: 0   Meds ordered this encounter  Medications  . predniSONE (DELTASONE) 20 MG tablet    Sig: Take 1 tablet (20 mg total) by mouth 2 (two) times daily with a meal.    Dispense:  10 tablet    Refill:  0  . azithromycin (ZITHROMAX) 250 MG tablet    Sig: Take 2 tablets (500 mg) on day 1 then 1 tablet (250 mg) on days 2-5    Dispense:  6 tablet    Refill:  0    Follow-up: Return in about 3 weeks (around 12/30/2016) for Follow-up on chronic medical conditions.   Jaclyn Shaggy MD

## 2016-12-09 NOTE — Progress Notes (Signed)
Patient is here for Cold Symptoms  Patient complains of drainage and cough beng present for a week. Patient complains of family being sick and possibly passing the germs. Patient complains of vomiting when eating. Last vomiting episode was on yesterday.  Patient has taken medication today. Patient has not eaten today.

## 2016-12-20 ENCOUNTER — Ambulatory Visit: Payer: Medicaid Other | Admitting: Family Medicine

## 2016-12-30 ENCOUNTER — Ambulatory Visit: Payer: Medicaid Other | Admitting: Family Medicine

## 2017-04-25 ENCOUNTER — Ambulatory Visit: Payer: Medicaid Other | Attending: Family Medicine | Admitting: Family Medicine

## 2017-04-25 ENCOUNTER — Encounter: Payer: Self-pay | Admitting: Family Medicine

## 2017-04-25 VITALS — BP 115/77 | HR 66 | Temp 98.1°F | Resp 18 | Ht 64.0 in | Wt 232.0 lb

## 2017-04-25 DIAGNOSIS — H6123 Impacted cerumen, bilateral: Secondary | ICD-10-CM | POA: Insufficient documentation

## 2017-04-25 DIAGNOSIS — G8929 Other chronic pain: Secondary | ICD-10-CM | POA: Diagnosis not present

## 2017-04-25 DIAGNOSIS — M1990 Unspecified osteoarthritis, unspecified site: Secondary | ICD-10-CM | POA: Diagnosis not present

## 2017-04-25 DIAGNOSIS — J328 Other chronic sinusitis: Secondary | ICD-10-CM | POA: Diagnosis not present

## 2017-04-25 DIAGNOSIS — Z114 Encounter for screening for human immunodeficiency virus [HIV]: Secondary | ICD-10-CM

## 2017-04-25 DIAGNOSIS — M5442 Lumbago with sciatica, left side: Secondary | ICD-10-CM | POA: Insufficient documentation

## 2017-04-25 DIAGNOSIS — Z7951 Long term (current) use of inhaled steroids: Secondary | ICD-10-CM | POA: Diagnosis not present

## 2017-04-25 DIAGNOSIS — J4530 Mild persistent asthma, uncomplicated: Secondary | ICD-10-CM | POA: Diagnosis not present

## 2017-04-25 DIAGNOSIS — H9203 Otalgia, bilateral: Secondary | ICD-10-CM | POA: Diagnosis present

## 2017-04-25 DIAGNOSIS — G4730 Sleep apnea, unspecified: Secondary | ICD-10-CM | POA: Insufficient documentation

## 2017-04-25 DIAGNOSIS — K219 Gastro-esophageal reflux disease without esophagitis: Secondary | ICD-10-CM | POA: Diagnosis not present

## 2017-04-25 DIAGNOSIS — M545 Low back pain: Secondary | ICD-10-CM | POA: Diagnosis present

## 2017-04-25 DIAGNOSIS — I10 Essential (primary) hypertension: Secondary | ICD-10-CM | POA: Diagnosis not present

## 2017-04-25 DIAGNOSIS — J329 Chronic sinusitis, unspecified: Secondary | ICD-10-CM | POA: Insufficient documentation

## 2017-04-25 MED ORDER — METOPROLOL SUCCINATE ER 25 MG PO TB24: 12.5000 mg | ORAL_TABLET | Freq: Every day | ORAL | 1 refills | Status: DC

## 2017-04-25 MED ORDER — BUDESONIDE-FORMOTEROL FUMARATE 160-4.5 MCG/ACT IN AERO: 2.0000 | INHALATION_SPRAY | Freq: Two times a day (BID) | RESPIRATORY_TRACT | 3 refills | Status: DC

## 2017-04-25 MED ORDER — CARBAMIDE PEROXIDE 6.5 % OT SOLN: 5.0000 [drp] | Freq: Two times a day (BID) | OTIC | 1 refills | Status: DC

## 2017-04-25 MED ORDER — ACETAMINOPHEN-CODEINE #3 300-30 MG PO TABS
1.0000 | ORAL_TABLET | Freq: Two times a day (BID) | ORAL | 1 refills | Status: DC | PRN
Start: 2017-04-25 — End: 2017-08-29

## 2017-04-25 MED ORDER — OMEPRAZOLE 20 MG PO CPDR: 20.0000 mg | DELAYED_RELEASE_CAPSULE | Freq: Every day | ORAL | 3 refills | Status: DC

## 2017-04-25 MED ORDER — FLUTICASONE PROPIONATE 50 MCG/ACT NA SUSP: 2.0000 | Freq: Every day | NASAL | 3 refills | Status: DC

## 2017-04-25 MED ORDER — NAPROXEN 500 MG PO TABS: 500.0000 mg | ORAL_TABLET | Freq: Two times a day (BID) | ORAL | 1 refills | Status: DC

## 2017-04-25 MED ORDER — CYCLOBENZAPRINE HCL 10 MG PO TABS: 10.0000 mg | ORAL_TABLET | Freq: Two times a day (BID) | ORAL | 3 refills | Status: DC | PRN

## 2017-04-25 MED ORDER — CETIRIZINE HCL 10 MG PO TABS: 10.0000 mg | ORAL_TABLET | Freq: Every day | ORAL | 2 refills | Status: DC

## 2017-04-25 MED ORDER — ALBUTEROL SULFATE HFA 108 (90 BASE) MCG/ACT IN AERS: 2.0000 | INHALATION_SPRAY | Freq: Two times a day (BID) | RESPIRATORY_TRACT | 3 refills | Status: DC

## 2017-04-25 MED FILL — ALL DAY ALLERGY 10 MG TAB: 10 | 30 days supply | Qty: 30 | Fill #0

## 2017-04-25 MED FILL — EAR DROPS 6.5%: 6.5 | 30 days supply | Qty: 15 | Fill #0

## 2017-04-25 MED FILL — ACETAMINOPHEN/COD #3 TABLET: 300-30 | 30 days supply | Qty: 60 | Fill #0

## 2017-04-25 NOTE — Progress Notes (Signed)
Subjective:  Patient ID: [name], female    DOB: [birth year]  Age: 51 y.o. MRN: [id number]  CC: Ear Pain   HPI Carlean Crowl is a 51 year old female with a history of hypertension who comes in for a follow up of Hypertension, Asthma, GERD and low back pain.  Low back pain is currently uncontrolled as she has been out of Tylenol#3 and she is requesting a refill. Lumbar spine x-ray from 05/2016 revealed mild to moderate lumbar spondylosis with relatively preserved intervertebral disc height. Pain is on the left side of her back but does not radiate down her lower extremities; she denies numbness, or loss of sphincteric function.  Remains compliant with her antihypertensives and low-sodium diet, denies any asthma exacerbation. Reflux symptoms are controlled on her PPI.  She complains of bilateral ear ache, associated headaches but no fever, no rhinorrhea. She has used Zyrtec with no relief in symptoms.  Past Medical History:  Diagnosis Date  . Asthma   . Gastric reflux   . History of blood transfusion yrs agio, none recent  . Hypertension   . Osteoarthritis   . Sleep apnea    cpap machine has not used since 2014 due to machine/tubing in bad shape   . Syncope and collapse yrs ago    Past Surgical History:  Procedure Laterality Date  . CARDIAC CATHETERIZATION     more than 5 years ago  . CARDIAC CATHETERIZATION  06-07-2012   [facility]: Normal coronary arteries. No significant LVOT/aortic valve gradient  . CHOLECYSTECTOMY    . COLONOSCOPY WITH PROPOFOL N/A 02/27/2014   Procedure: COLONOSCOPY WITH PROPOFOL;  Surgeon: Garlan Fair, MD;  Location: [facility];  Service: Endoscopy;  Laterality: N/A;  . ESOPHAGOGASTRODUODENOSCOPY (EGD) WITH PROPOFOL N/A 02/27/2014   Procedure: ESOPHAGOGASTRODUODENOSCOPY (EGD) WITH PROPOFOL;  Surgeon: Garlan Fair, MD;  Location: [facility];  Service: Endoscopy;  Laterality: N/A;  . EXPLORATORY LAPAROTOMY  09/2001   ELAP, LOA, R partial  oophorectomy, repair of bowel injury  . VAGINAL HYSTERECTOMY  2001   Adnexa left in place    Allergies  Allergen Reactions  . No Known Allergies      Outpatient Medications Prior to Visit  Medication Sig Dispense Refill  . promethazine (PHENERGAN) 25 MG tablet Take 1 tablet (25 mg total) by mouth every 12 (twelve) hours as needed for nausea or vomiting. 20 tablet 0  . acetaminophen-codeine (TYLENOL #3) 300-30 MG tablet Take 1 tablet by mouth every 12 (twelve) hours as needed for moderate pain. 60 tablet 1  . albuterol (PROVENTIL HFA;VENTOLIN HFA) 108 (90 Base) MCG/ACT inhaler Inhale 2 puffs into the lungs 2 (two) times daily. 1 Inhaler 3  . azithromycin (ZITHROMAX) 250 MG tablet Take 2 tablets (500 mg) on day 1 then 1 tablet (250 mg) on days 2-5 6 tablet 0  . budesonide-formoterol (SYMBICORT) 160-4.5 MCG/ACT inhaler Inhale 2 puffs into the lungs 2 (two) times daily. 1 Inhaler 3  . cyclobenzaprine (FLEXERIL) 10 MG tablet Take 1 tablet (10 mg total) by mouth 2 (two) times daily as needed for muscle spasms. 60 tablet 3  . metoprolol succinate (TOPROL-XL) 25 MG 24 hr tablet Take 0.5 tablets (12.5 mg total) by mouth daily. 45 tablet 1  . naproxen (NAPROSYN) 500 MG tablet Take 1 tablet (500 mg total) by mouth 2 (two) times daily with a meal. 120 tablet 1  . predniSONE (DELTASONE) 20 MG tablet Take 1 tablet (20 mg total) by mouth 2 (two) times daily with a meal.  10 tablet 0  . cetirizine (ZYRTEC) 10 MG tablet Take 1 tablet (10 mg total) by mouth at bedtime. 7 tablet 0  . omeprazole (PRILOSEC) 40 MG capsule Take 1 capsule (40 mg total) by mouth daily. 30 capsule 3  . gi cocktail (Maalox,Lidocaine,Donnatal)      No facility-administered medications prior to visit.     ROS Review of Systems  Constitutional: Negative for activity change, appetite change and fatigue.  HENT: Positive for ear pain. Negative for congestion, sinus pressure and sore throat.   Eyes: Negative for visual disturbance.    Respiratory: Negative for cough, chest tightness, shortness of breath and wheezing.   Cardiovascular: Negative for chest pain and palpitations.  Gastrointestinal: Negative for abdominal distention, abdominal pain and constipation.  Endocrine: Negative for polydipsia.  Genitourinary: Negative for dysuria and frequency.  Musculoskeletal: Positive for back pain. Negative for arthralgias.  Skin: Negative for rash.  Neurological: Positive for headaches. Negative for tremors, light-headedness and numbness.  Hematological: Does not bruise/bleed easily.  Psychiatric/Behavioral: Negative for agitation and behavioral problems.    Objective:  BP 115/77 (BP Location: Left Arm, Patient Position: Sitting, Cuff Size: Large)   Pulse 66   Temp 98.1 F (36.7 C) (Oral)   Resp 18   Ht 5\' 4"  (1.626 m)   Wt 232 lb (105.2 kg)   SpO2 97%   BMI 39.82 kg/m   BP/Weight 04/25/2017 12/09/2016 11/29/2016  Systolic BP 115 113 109  Diastolic BP 77 79 71  Wt. (Lbs) 232 232.6 238  BMI 39.82 39.93 40.85      Physical Exam  Constitutional: She is oriented to person, place, and time. She appears well-developed and well-nourished.  HENT:  Mouth/Throat: Oropharynx is clear and moist.  Cerumen obscuring bilateral tympanic membrane No sinus tenderness  Neck: No JVD present.  Cardiovascular: Normal rate and intact distal pulses.   Murmur heard. Pulmonary/Chest: Effort normal and breath sounds normal. She has no wheezes. She has no rales. She exhibits no tenderness.  Abdominal: Soft. Bowel sounds are normal. She exhibits no distension and no mass. There is no tenderness.  Musculoskeletal: Normal range of motion. She exhibits tenderness (tenderness on palpation of the lumbar spine and paraspinal lumbar muscles).  Neurological: She is alert and oriented to person, place, and time.  Skin: Skin is warm and dry.  Psychiatric: She has a normal mood and affect.     Assessment & Plan:   1. Chronic left-sided low  back pain with left-sided sciatica Back brace could be beneficial Apply heat If symptoms persist will refer to spine specialist for possible epidural spinal injections - naproxen (NAPROSYN) 500 MG tablet; Take 1 tablet (500 mg total) by mouth 2 (two) times daily with a meal.  Dispense: 120 tablet; Refill: 1 - acetaminophen-codeine (TYLENOL #3) 300-30 MG tablet; Take 1 tablet by mouth every 12 (twelve) hours as needed for moderate pain.  Dispense: 60 tablet; Refill: 1 - cyclobenzaprine (FLEXERIL) 10 MG tablet; Take 1 tablet (10 mg total) by mouth 2 (two) times daily as needed for muscle spasms.  Dispense: 60 tablet; Refill: 3  2. Mild persistent asthma without complication No acute exacerbation - budesonide-formoterol (SYMBICORT) 160-4.5 MCG/ACT inhaler; Inhale 2 puffs into the lungs 2 (two) times daily.  Dispense: 1 Inhaler; Refill: 3 - albuterol (PROVENTIL HFA;VENTOLIN HFA) 108 (90 Base) MCG/ACT inhaler; Inhale 2 puffs into the lungs 2 (two) times daily.  Dispense: 1 Inhaler; Refill: 3  3. Other chronic sinusitis Uncontrolled Flonase added to her regimen -  cetirizine (ZYRTEC) 10 MG tablet; Take 1 tablet (10 mg total) by mouth daily.  Dispense: 30 tablet; Refill: 2 - fluticasone (FLONASE) 50 MCG/ACT nasal spray; Place 2 sprays into both nostrils daily.  Dispense: 16 g; Refill: 3  4. Gastroesophageal reflux disease, esophagitis presence not specified Controlled I have reduced the dose of omeprazole and will consider discontinuing this down the road due to adverse effects associated prolonged use-she is hesitant at this time - omeprazole (PRILOSEC) 20 MG capsule; Take 1 capsule (20 mg total) by mouth daily.  Dispense: 30 capsule; Refill: 3  5. Encounter for screening for HIV - HIV antibody (with reflex)  6. Essential hypertension Controlled Low-sodium diet - CMP14+EGFR - Lipid panel  7. Bilateral impacted cerumen Refuses ear Irrigation in the office - carbamide peroxide (DEBROX) 6.5  % OTIC solution; Place 5 drops into both ears 2 (two) times daily.  Dispense: 15 mL; Refill: 1   Meds ordered this encounter  Medications  . naproxen (NAPROSYN) 500 MG tablet    Sig: Take 1 tablet (500 mg total) by mouth 2 (two) times daily with a meal.    Dispense:  120 tablet    Refill:  1  . acetaminophen-codeine (TYLENOL #3) 300-30 MG tablet    Sig: Take 1 tablet by mouth every 12 (twelve) hours as needed for moderate pain.    Dispense:  60 tablet    Refill:  1  . budesonide-formoterol (SYMBICORT) 160-4.5 MCG/ACT inhaler    Sig: Inhale 2 puffs into the lungs 2 (two) times daily.    Dispense:  1 Inhaler    Refill:  3  . albuterol (PROVENTIL HFA;VENTOLIN HFA) 108 (90 Base) MCG/ACT inhaler    Sig: Inhale 2 puffs into the lungs 2 (two) times daily.    Dispense:  1 Inhaler    Refill:  3  . metoprolol succinate (TOPROL-XL) 25 MG 24 hr tablet    Sig: Take 0.5 tablets (12.5 mg total) by mouth daily.    Dispense:  45 tablet    Refill:  1    **Patient requests 90 days supply**  . cetirizine (ZYRTEC) 10 MG tablet    Sig: Take 1 tablet (10 mg total) by mouth daily.    Dispense:  30 tablet    Refill:  2    To replace BID  . cyclobenzaprine (FLEXERIL) 10 MG tablet    Sig: Take 1 tablet (10 mg total) by mouth 2 (two) times daily as needed for muscle spasms.    Dispense:  60 tablet    Refill:  3  . omeprazole (PRILOSEC) 20 MG capsule    Sig: Take 1 capsule (20 mg total) by mouth daily.    Dispense:  30 capsule    Refill:  3  . carbamide peroxide (DEBROX) 6.5 % OTIC solution    Sig: Place 5 drops into both ears 2 (two) times daily.    Dispense:  15 mL    Refill:  1  . fluticasone (FLONASE) 50 MCG/ACT nasal spray    Sig: Place 2 sprays into both nostrils daily.    Dispense:  16 g    Refill:  3    Follow-up: Return in about 3 weeks (around 05/16/2017) for complete physical exam.   Arnoldo Morale MD

## 2017-04-25 NOTE — Progress Notes (Signed)
Patient has not eaten  Patient has not had her medications

## 2017-04-26 LAB — CMP14+EGFR
ALT: 19 IU/L (ref 0–32)
AST: 13 IU/L (ref 0–40)
Albumin/Globulin Ratio: 1.8 (ref 1.2–2.2)
Albumin: 4.2 g/dL (ref 3.5–5.5)
Alkaline Phosphatase: 92 IU/L (ref 39–117)
BUN/Creatinine Ratio: 17 (ref 9–23)
BUN: 10 mg/dL (ref 6–24)
Bilirubin Total: 0.5 mg/dL (ref 0.0–1.2)
CO2: 24 mmol/L (ref 20–29)
Calcium: 9.5 mg/dL (ref 8.7–10.2)
Chloride: 107 mmol/L — ABNORMAL HIGH (ref 96–106)
Creatinine, Ser: 0.58 mg/dL (ref 0.57–1.00)
GFR calc Af Amer: 123 mL/min/{1.73_m2} (ref >59)
GFR calc non Af Amer: 106 mL/min/{1.73_m2} (ref >59)
Globulin, Total: 2.3 g/dL (ref 1.5–4.5)
Glucose: 83 mg/dL (ref 65–99)
Potassium: 3.8 mmol/L (ref 3.5–5.2)
Sodium: 143 mmol/L (ref 134–144)
Total Protein: 6.5 g/dL (ref 6.0–8.5)

## 2017-04-26 LAB — LIPID PANEL
Chol/HDL Ratio: 3.4 ratio (ref 0.0–4.4)
Cholesterol, Total: 171 mg/dL (ref 100–199)
HDL: 51 mg/dL (ref >39)
LDL Calculated: 106 mg/dL — ABNORMAL HIGH (ref 0–99)
Triglycerides: 68 mg/dL (ref 0–149)
VLDL Cholesterol Cal: 14 mg/dL (ref 5–40)

## 2017-04-26 LAB — HIV ANTIBODY (ROUTINE TESTING W REFLEX): HIV Screen 4th Generation wRfx: NONREACTIVE

## 2017-05-24 ENCOUNTER — Encounter: Payer: Medicaid Other | Admitting: Family Medicine

## 2017-05-25 ENCOUNTER — Other Ambulatory Visit: Payer: Self-pay | Admitting: Family Medicine

## 2017-05-25 DIAGNOSIS — G8929 Other chronic pain: Secondary | ICD-10-CM

## 2017-05-25 DIAGNOSIS — M5442 Lumbago with sciatica, left side: Principal | ICD-10-CM

## 2017-05-25 MED FILL — ACETAMINOPHEN/COD #3 TABLET: 300-30 | 30 days supply | Qty: 60 | Fill #1

## 2017-05-29 ENCOUNTER — Encounter: Payer: Self-pay | Admitting: Family Medicine

## 2017-05-29 ENCOUNTER — Ambulatory Visit: Payer: Medicaid Other | Attending: Family Medicine | Admitting: Family Medicine

## 2017-05-29 VITALS — BP 116/77 | HR 67 | Temp 98.1°F | Ht 64.0 in | Wt 229.0 lb

## 2017-05-29 DIAGNOSIS — Z79899 Other long term (current) drug therapy: Secondary | ICD-10-CM | POA: Diagnosis not present

## 2017-05-29 DIAGNOSIS — M545 Low back pain: Secondary | ICD-10-CM | POA: Diagnosis not present

## 2017-05-29 DIAGNOSIS — G4730 Sleep apnea, unspecified: Secondary | ICD-10-CM | POA: Insufficient documentation

## 2017-05-29 DIAGNOSIS — K219 Gastro-esophageal reflux disease without esophagitis: Secondary | ICD-10-CM | POA: Diagnosis not present

## 2017-05-29 DIAGNOSIS — J45909 Unspecified asthma, uncomplicated: Secondary | ICD-10-CM | POA: Insufficient documentation

## 2017-05-29 DIAGNOSIS — Z7951 Long term (current) use of inhaled steroids: Secondary | ICD-10-CM | POA: Diagnosis not present

## 2017-05-29 DIAGNOSIS — Z0000 Encounter for general adult medical examination without abnormal findings: Secondary | ICD-10-CM | POA: Insufficient documentation

## 2017-05-29 DIAGNOSIS — Z1231 Encounter for screening mammogram for malignant neoplasm of breast: Secondary | ICD-10-CM | POA: Diagnosis not present

## 2017-05-29 DIAGNOSIS — I10 Essential (primary) hypertension: Secondary | ICD-10-CM | POA: Insufficient documentation

## 2017-05-29 DIAGNOSIS — Z131 Encounter for screening for diabetes mellitus: Secondary | ICD-10-CM | POA: Diagnosis not present

## 2017-05-29 DIAGNOSIS — Z90710 Acquired absence of both cervix and uterus: Secondary | ICD-10-CM | POA: Insufficient documentation

## 2017-05-29 DIAGNOSIS — M5136 Other intervertebral disc degeneration, lumbar region: Secondary | ICD-10-CM

## 2017-05-29 DIAGNOSIS — Z1239 Encounter for other screening for malignant neoplasm of breast: Secondary | ICD-10-CM

## 2017-05-29 LAB — POCT URINALYSIS DIPSTICK
Bilirubin, UA: NEGATIVE
Glucose, UA: NEGATIVE
Ketones, UA: NEGATIVE
Leukocytes, UA: NEGATIVE
Nitrite, UA: NEGATIVE
Protein, UA: NEGATIVE
Spec Grav, UA: 1.02 (ref 1.010–1.025)
Urobilinogen, UA: 0.2 E.U./dL
pH, UA: 5.5 (ref 5.0–8.0)

## 2017-05-29 LAB — POCT GLYCOSYLATED HEMOGLOBIN (HGB A1C): Hemoglobin A1C: 5.4

## 2017-05-29 MED ORDER — PREDNISONE 20 MG PO TABS: 20.0000 mg | ORAL_TABLET | Freq: Two times a day (BID) | ORAL | 0 refills | Status: DC

## 2017-05-29 NOTE — Progress Notes (Signed)
Subjective:  Patient ID: [name], female    DOB: 01-07-1966  Age: 51 y.o. MRN: [id number]  CC: Annual Exam and Back Pain   HPI Sharika Mosquera presents for A complete physical exam.  She complains of a flare up of her low back pain which is worsened when she sits for prolonged period of time and has not been relieved by taking her Flexeril on Tylenol 3 which she has. Pain does not radiate down her legs and she denies numbness.  Past Medical History:  Diagnosis Date  . Asthma   . Gastric reflux   . History of blood transfusion yrs agio, none recent  . Hypertension   . Osteoarthritis   . Sleep apnea    cpap machine has not used since 2014 due to machine/tubing in bad shape   . Syncope and collapse yrs ago    Past Surgical History:  Procedure Laterality Date  . CARDIAC CATHETERIZATION     more than 5 years ago  . CARDIAC CATHETERIZATION  06-07-2012   [facility]: Normal coronary arteries. No significant LVOT/aortic valve gradient  . CHOLECYSTECTOMY    . COLONOSCOPY WITH PROPOFOL N/A 02/27/2014   Procedure: COLONOSCOPY WITH PROPOFOL;  Surgeon: Charolett Bumpers, MD;  Location: [facility];  Service: Endoscopy;  Laterality: N/A;  . ESOPHAGOGASTRODUODENOSCOPY (EGD) WITH PROPOFOL N/A 02/27/2014   Procedure: ESOPHAGOGASTRODUODENOSCOPY (EGD) WITH PROPOFOL;  Surgeon: Charolett Bumpers, MD;  Location: [facility];  Service: Endoscopy;  Laterality: N/A;  . EXPLORATORY LAPAROTOMY  09/2001   ELAP, LOA, R partial oophorectomy, repair of bowel injury  . VAGINAL HYSTERECTOMY  2001   Adnexa left in place    Allergies  Allergen Reactions  . No Known Allergies      Outpatient Medications Prior to Visit  Medication Sig Dispense Refill  . acetaminophen-codeine (TYLENOL #3) 300-30 MG tablet Take 1 tablet by mouth every 12 (twelve) hours as needed for moderate pain. 60 tablet 1  . albuterol (PROVENTIL HFA;VENTOLIN HFA) 108 (90 Base) MCG/ACT inhaler Inhale 2 puffs into the lungs 2 (two) times  daily. 1 Inhaler 3  . budesonide-formoterol (SYMBICORT) 160-4.5 MCG/ACT inhaler Inhale 2 puffs into the lungs 2 (two) times daily. 1 Inhaler 3  . carbamide peroxide (DEBROX) 6.5 % OTIC solution Place 5 drops into both ears 2 (two) times daily. 15 mL 1  . cyclobenzaprine (FLEXERIL) 10 MG tablet Take 1 tablet (10 mg total) by mouth 2 (two) times daily as needed for muscle spasms. 60 tablet 3  . fluticasone (FLONASE) 50 MCG/ACT nasal spray Place 2 sprays into both nostrils daily. 16 g 3  . metoprolol succinate (TOPROL-XL) 25 MG 24 hr tablet Take 0.5 tablets (12.5 mg total) by mouth daily. 45 tablet 1  . naproxen (NAPROSYN) 500 MG tablet Take 1 tablet (500 mg total) by mouth 2 (two) times daily with a meal. 120 tablet 1  . promethazine (PHENERGAN) 25 MG tablet Take 1 tablet (25 mg total) by mouth every 12 (twelve) hours as needed for nausea or vomiting. 20 tablet 0  . cetirizine (ZYRTEC) 10 MG tablet Take 1 tablet (10 mg total) by mouth daily. 30 tablet 2  . omeprazole (PRILOSEC) 20 MG capsule Take 1 capsule (20 mg total) by mouth daily. 30 capsule 3   No facility-administered medications prior to visit.     ROS Review of Systems  Constitutional: Negative for activity change, appetite change and fatigue.  HENT: Negative for congestion, sinus pressure and sore throat.   Eyes: Negative for  visual disturbance.  Respiratory: Negative for cough, chest tightness, shortness of breath and wheezing.   Cardiovascular: Negative for chest pain and palpitations.  Gastrointestinal: Negative for abdominal distention, abdominal pain and constipation.  Endocrine: Negative for polydipsia.  Genitourinary: Negative for dysuria and frequency.  Musculoskeletal: Positive for back pain. Negative for arthralgias.  Skin: Negative for rash.  Neurological: Negative for tremors, light-headedness and numbness.  Hematological: Does not bruise/bleed easily.  Psychiatric/Behavioral: Negative for agitation and behavioral  problems.    Objective:  BP 116/77   Pulse 67   Temp 98.1 F (36.7 C) (Other (Comment))   Ht 5\' 4"  (1.626 m)   Wt 229 lb (103.9 kg)   SpO2 100%   BMI 39.31 kg/m   BP/Weight 05/29/2017 04/25/2017 12/09/2016  Systolic BP 116 115 113  Diastolic BP 77 77 79  Wt. (Lbs) 229 232 232.6  BMI 39.31 39.82 39.93      Physical Exam Constitutional: She is oriented to person, place, and time. She appears well-developed and well-nourished.  HENT:  Mouth/Throat: Oropharynx is clear and moist.  Cerumen obscuring bilateral tympanic membrane No sinus tenderness  Neck: No JVD present.  Cardiovascular: Normal rate and intact distal pulses.   Murmur heard. Pulmonary/Chest: Effort normal and breath sounds normal. She has no wheezes. She has no rales. She exhibits no tenderness.  Breasts: Normal appearance, normal palpable lumps, nontender Abdominal: Soft. Bowel sounds are normal. She exhibits no distension and no mass. There is no tenderness.  Musculoskeletal: Normal range of motion. She exhibits tenderness (tenderness on palpation of the lumbar spine and paraspinal lumbar muscles).  Neurological: She is alert and oriented to person, place, and time.  Skin: Skin is warm and dry.  Psychiatric: She has a normal mood and affect.   Assessment & Plan:   1. Left low back pain, unspecified chronicity, with sciatica presence unspecified Placed on prednisone Continue Flexeril and Tylenol No. 3  2. Screening for diabetes mellitus Lab Results  Component Value Date   HGBA1C 5.4 05/29/2017     3. Annual physical exam Status post hysterectomy-Pap smear not indicated  4. Screening for breast cancer - MM Digital Screening; Future  5. Other intervertebral disc degeneration, lumbar region - POCT urinalysis dipstick - MR Lumbar Spine Wo Contrast; Future - predniSONE (DELTASONE) 20 MG tablet; Take 1 tablet (20 mg total) by mouth 2 (two) times daily with a meal.  Dispense: 10 tablet; Refill: 0   Meds  ordered this encounter  Medications  . predniSONE (DELTASONE) 20 MG tablet    Sig: Take 1 tablet (20 mg total) by mouth 2 (two) times daily with a meal.    Dispense:  10 tablet    Refill:  0    Follow-up: Return in about 3 months (around 08/29/2017) for Follow-up of chronic medical conditions.   This note has been created with Education officer, environmental. Any transcriptional errors are unintentional.     Jaclyn Shaggy MD

## 2017-06-02 ENCOUNTER — Ambulatory Visit (HOSPITAL_COMMUNITY): Payer: Medicaid Other

## 2017-06-09 ENCOUNTER — Ambulatory Visit (HOSPITAL_COMMUNITY): Payer: Medicaid Other

## 2017-06-16 ENCOUNTER — Ambulatory Visit (HOSPITAL_COMMUNITY): Admission: RE | Admit: 2017-06-16 | Payer: Medicaid Other | Source: Ambulatory Visit

## 2017-06-28 ENCOUNTER — Emergency Department (HOSPITAL_COMMUNITY)
Admission: EM | Admit: 2017-06-28 | Discharge: 2017-06-29 | Disposition: A | Discharge status: Home / Self Care | Payer: Medicaid Other | Attending: Emergency Medicine | Admitting: Emergency Medicine

## 2017-06-28 ENCOUNTER — Emergency Department (HOSPITAL_COMMUNITY): Payer: Medicaid Other

## 2017-06-28 ENCOUNTER — Encounter (HOSPITAL_COMMUNITY): Payer: Self-pay | Admitting: *Deleted

## 2017-06-28 DIAGNOSIS — R509 Fever, unspecified: Secondary | ICD-10-CM | POA: Diagnosis not present

## 2017-06-28 DIAGNOSIS — R05 Cough: Secondary | ICD-10-CM | POA: Diagnosis not present

## 2017-06-28 DIAGNOSIS — J45909 Unspecified asthma, uncomplicated: Secondary | ICD-10-CM | POA: Insufficient documentation

## 2017-06-28 DIAGNOSIS — Z79899 Other long term (current) drug therapy: Secondary | ICD-10-CM | POA: Diagnosis not present

## 2017-06-28 DIAGNOSIS — R0602 Shortness of breath: Secondary | ICD-10-CM | POA: Diagnosis not present

## 2017-06-28 DIAGNOSIS — R072 Precordial pain: Secondary | ICD-10-CM | POA: Insufficient documentation

## 2017-06-28 DIAGNOSIS — R079 Chest pain, unspecified: Secondary | ICD-10-CM | POA: Diagnosis not present

## 2017-06-28 DIAGNOSIS — R059 Cough, unspecified: Secondary | ICD-10-CM

## 2017-06-28 NOTE — ED Triage Notes (Signed)
Pt reports having a productive cough with yellow sputum x 2 days. Has chest wall pain that occurs when coughing and breathing. Denies fever. No cardiac hx. No distress noted at triage.

## 2017-06-29 LAB — I-STAT CHEM 8, ED
BUN: 13 mg/dL (ref 6–20)
Calcium, Ion: 1.2 mmol/L (ref 1.15–1.40)
Chloride: 106 mmol/L (ref 101–111)
Creatinine, Ser: 0.5 mg/dL (ref 0.44–1.00)
Glucose, Bld: 89 mg/dL (ref 65–99)
HCT: 36 % (ref 36.0–46.0)
Hemoglobin: 12.2 g/dL (ref 12.0–15.0)
Potassium: 3.2 mmol/L — ABNORMAL LOW (ref 3.5–5.1)
Sodium: 145 mmol/L (ref 135–145)
TCO2: 27 mmol/L (ref 22–32)

## 2017-06-29 LAB — I-STAT TROPONIN, ED: Troponin i, poc: 0 ng/mL (ref 0.00–0.08)

## 2017-06-29 MED ORDER — IBUPROFEN 800 MG PO TABS
800.0000 mg | ORAL_TABLET | Freq: Once | ORAL | Status: AC
  Administered 2017-06-29: 800 mg via ORAL
  Filled 2017-06-29: qty 1

## 2017-06-29 MED ORDER — HYDROCODONE-ACETAMINOPHEN 5-325 MG PO TABS
1.0000 | ORAL_TABLET | Freq: Once | ORAL | Status: AC
  Administered 2017-06-29: 1 via ORAL
  Filled 2017-06-29: qty 1

## 2017-06-29 MED ORDER — IPRATROPIUM-ALBUTEROL 0.5-2.5 (3) MG/3ML IN SOLN
3.0000 mL | Freq: Once | RESPIRATORY_TRACT | Status: AC
  Administered 2017-06-29: 3 mL via RESPIRATORY_TRACT
  Filled 2017-06-29: qty 3

## 2017-06-29 NOTE — ED Provider Notes (Signed)
[facility]EMER[facility]ENCY DEPT Provider Note   CSN: [id number] Arrival date & time: 06/28/17  1742     History   Chief Complaint Chief Complaint  Patient presents with  . Cough  . Chest Pain    HPI Mio Schellinger is a 51 y.o. female.  The history is provided by the patient and a relative.  Cough  This is a new problem. The current episode started more than 2 days ago. The problem occurs hourly. The problem has been gradually improving. The cough is productive of sputum. Associated symptoms include chest pain, chills and shortness of breath. She is not a smoker.  Chest Pain   Associated symptoms include cough, a fever and shortness of breath. Pertinent negatives include no vomiting.   Patient reports over the past week she has had cough/congestion/fever Her cough is improving, but eariler in the day (9/19) she began having chest tightness. She reports shortness of breath CP worse with movement/palpation No vomiting/diarrhea No h/o CAD/PE No hemoptysis  Past Medical History:  Diagnosis Date  . Asthma   . Gastric reflux   . History of blood transfusion yrs agio, none recent  . Hypertension   . Osteoarthritis   . Sleep apnea    cpap machine has not used since 2014 due to machine/tubing in bad shape   . Syncope and collapse yrs ago    Patient Active Problem List   Diagnosis Date Noted  . Itching with irritation 06/23/2016  . Gastric reflux 05/04/2016  . Asthma 05/04/2016  . Essential hypertension 07/23/2015  . Mild depression (HCC) 07/23/2015  . Chest pain 06/06/2012  . Murmur, cardiac 06/06/2012    Past Surgical History:  Procedure Laterality Date  . CARDIAC CATHETERIZATION     more than 5 years ago  . CARDIAC CATHETERIZATION  06-07-2012   [facility]: Normal coronary arteries. No significant LVOT/aortic valve gradient  . CHOLECYSTECTOMY    . COLONOSCOPY WITH PROPOFOL N/A 02/27/2014   Procedure: COLONOSCOPY WITH PROPOFOL;  Surgeon: Charolett Bumpers, MD;  Location: [facility]  ENDOSCOPY;  Service: Endoscopy;  Laterality: N/A;  . ESOPHAGOGASTRODUODENOSCOPY (EGD) WITH PROPOFOL N/A 02/27/2014   Procedure: ESOPHAGOGASTRODUODENOSCOPY (EGD) WITH PROPOFOL;  Surgeon: Charolett Bumpers, MD;  Location: [facility] ENDOSCOPY;  Service: Endoscopy;  Laterality: N/A;  . EXPLORATORY LAPAROTOMY  09/2001   ELAP, LOA, R partial oophorectomy, repair of bowel injury  . VAGINAL HYSTERECTOMY  2001   Adnexa left in place    OB History    Gravida Para Term Preterm AB Living   SAB TAB Ectopic Multiple Live Births                   Home Medications    Prior to Admission medications   Medication Sig Start Date End Date Taking? Authorizing Provider  acetaminophen-codeine (TYLENOL #3) 300-30 MG tablet Take 1 tablet by mouth every 12 (twelve) hours as needed for moderate pain. 04/25/17   Jaclyn Shaggy, MD  albuterol (PROVENTIL HFA;VENTOLIN HFA) 108 (90 Base) MCG/ACT inhaler Inhale 2 puffs into the lungs 2 (two) times daily. 04/25/17   Jaclyn Shaggy, MD  budesonide-formoterol (SYMBICORT) 160-4.5 MCG/ACT inhaler Inhale 2 puffs into the lungs 2 (two) times daily. 04/25/17   Jaclyn Shaggy, MD  carbamide peroxide (DEBROX) 6.5 % OTIC solution Place 5 drops into both ears 2 (two) times daily. 04/25/17   Jaclyn Shaggy, MD  cetirizine (ZYRTEC) 10 MG tablet Take 1 tablet (10 mg total) by mouth daily. 04/25/17  05/02/17  Jaclyn Shaggy, MD  cyclobenzaprine (FLEXERIL) 10 MG tablet Take 1 tablet (10 mg total) by mouth 2 (two) times daily as needed for muscle spasms. 04/25/17   Jaclyn Shaggy, MD  fluticasone (FLONASE) 50 MCG/ACT nasal spray Place 2 sprays into both nostrils daily. 04/25/17   Jaclyn Shaggy, MD  metoprolol succinate (TOPROL-XL) 25 MG 24 hr tablet Take 0.5 tablets (12.5 mg total) by mouth daily. 04/25/17   Jaclyn Shaggy, MD  naproxen (NAPROSYN) 500 MG tablet Take 1 tablet (500 mg total) by mouth 2 (two) times daily with a meal. 04/25/17   Jaclyn Shaggy, MD  omeprazole (PRILOSEC) 20 MG capsule  Take 1 capsule (20 mg total) by mouth daily. 04/25/17 05/25/17  Jaclyn Shaggy, MD  predniSONE (DELTASONE) 20 MG tablet Take 1 tablet (20 mg total) by mouth 2 (two) times daily with a meal. 05/29/17   Jaclyn Shaggy, MD  promethazine (PHENERGAN) 25 MG tablet Take 1 tablet (25 mg total) by mouth every 12 (twelve) hours as needed for nausea or vomiting. 11/29/16   Jaclyn Shaggy, MD    Family History History reviewed. No pertinent family history.  Social History Social History  Substance Use Topics  . Smoking status: Never Smoker  . Smokeless tobacco: Never Used  . Alcohol use No     Allergies   No known allergies   Review of Systems Review of Systems  Constitutional: Positive for chills and fever.  Respiratory: Positive for cough and shortness of breath.   Cardiovascular: Positive for chest pain. Negative for leg swelling.  Gastrointestinal: Negative for diarrhea and vomiting.  All other systems reviewed and are negative.    Physical Exam Updated Vital Signs BP 120/89   Pulse 72   Temp 98.6 F (37 C) (Oral)   Resp (!) 22   SpO2 100%   Physical Exam CONSTITUTIONAL: Well developed/well nourished HEAD: Normocephalic/atraumatic EYES: EOMI/PERRL ENMT: Mucous membranes moist NECK: supple no meningeal signs SPINE/BACK:entire spine nontender CV: S1/S2 noted, murmur noted (chronic) LUNGS: Lungs are clear to auscultation bilaterally, no apparent distress Chest - tenderness to palpation of chest wall, no bruising or crepitus ABDOMEN: soft, nontender, no rebound or guarding, bowel sounds noted throughout abdomen GU:no cva tenderness NEURO: Pt is awake/alert/appropriate, moves all extremitiesx4.  No facial droop.   EXTREMITIES: pulses normal/equal, full ROM, no calf tenderness noted SKIN: warm, color normal PSYCH: no abnormalities of mood noted, alert and oriented to situation   ED Treatments / Results  Labs (all labs ordered are listed, but only abnormal results are  displayed) Labs Reviewed  I-STAT CHEM 8, ED - Abnormal; Notable for the following:       Result Value   Potassium 3.2 (*)    All other components within normal limits  I-STAT TROPONIN, ED    EKG  EKG Interpretation  Date/Time:  Wednesday June 28 2017 17:54:16 EDT Ventricular Rate:  85 PR Interval:  174 QRS Duration: 94 QT Interval:  376 QTC Calculation: 447 R Axis:   -12 Text Interpretation:  Normal sinus rhythm Possible Left atrial enlargement Borderline ECG No significant change since last tracing Confirmed by Zadie Rhine ([id number]) on 06/29/2017 12:42:44 AM       Radiology Dg Chest 2 View  Result Date: 06/28/2017 CLINICAL DATA:  Chest pain with cough. EXAM: CHEST  2 VIEW COMPARISON:  Chest x-ray dated 06/26/2015. FINDINGS: Cardiomediastinal silhouette is normal in size and configuration. Lungs are clear. No evidence of pneumonia. No pleural effusion. No pneumothorax seen. Osseous and soft tissue  structures about the chest are unremarkable. Mild degenerative spurring within the slightly kyphotic thoracolumbar spine. IMPRESSION: No active cardiopulmonary disease. No evidence of pneumonia or pulmonary edema. Electronically Signed   By: Bary Richard M.D.   On: 06/28/2017 18:51    Procedures Procedures   Medications Ordered in ED Medications  ipratropium-albuterol (DUONEB) 0.5-2.5 (3) MG/3ML nebulizer solution 3 mL (3 mLs Nebulization Given 06/29/17 0128)  ibuprofen (ADVIL,MOTRIN) tablet 800 mg (800 mg Oral Given 06/29/17 0127)  HYDROcodone-acetaminophen (NORCO/VICODIN) 5-325 MG per tablet 1 tablet (1 tablet Oral Given 06/29/17 0127)     Initial Impression / Assessment and Plan / ED Course  I have reviewed the triage vital signs and the nursing notes.  Pertinent labs & imaging results that were available during my care of the patient were reviewed by me and considered in my medical decision making (see chart for details).     Pt well appearing After nebs and  medications she is improved Suspect due to recent cough/fever, she likely strained her chest wall She is improved Vitals appropriate No h/o CAD and previous cardiac cath negative Will d/c home We discussed strict ER return precautions   Final Clinical Impressions(s) / ED Diagnoses   Final diagnoses:  Cough  Precordial pain    New Prescriptions New Prescriptions   No medications on file     Zadie Rhine, MD 06/29/17 0225

## 2017-06-29 NOTE — Discharge Instructions (Signed)

## 2017-07-05 ENCOUNTER — Ambulatory Visit: Payer: Medicaid Other

## 2017-07-18 ENCOUNTER — Other Ambulatory Visit: Payer: Self-pay | Admitting: Family Medicine

## 2017-07-24 IMAGING — DX DG LUMBAR SPINE COMPLETE 4+V
5 series · 5 of 5 positions shown · non-contrast
Comparison: 11/10/2014

CLINICAL DATA: Left low back pain, duration 1 week, worse today.

EXAM:
LUMBAR SPINE - COMPLETE 4+ VIEW

[l-spine ap]
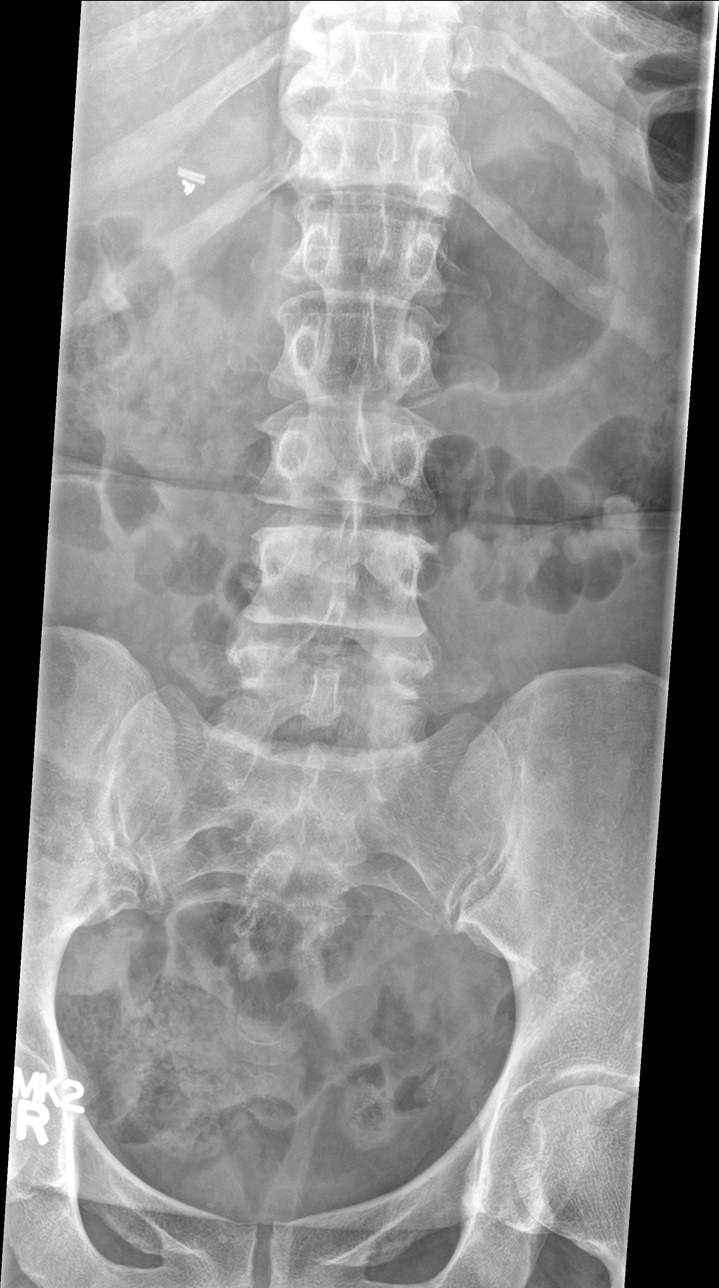

[l-spine obl (1 of 2)]
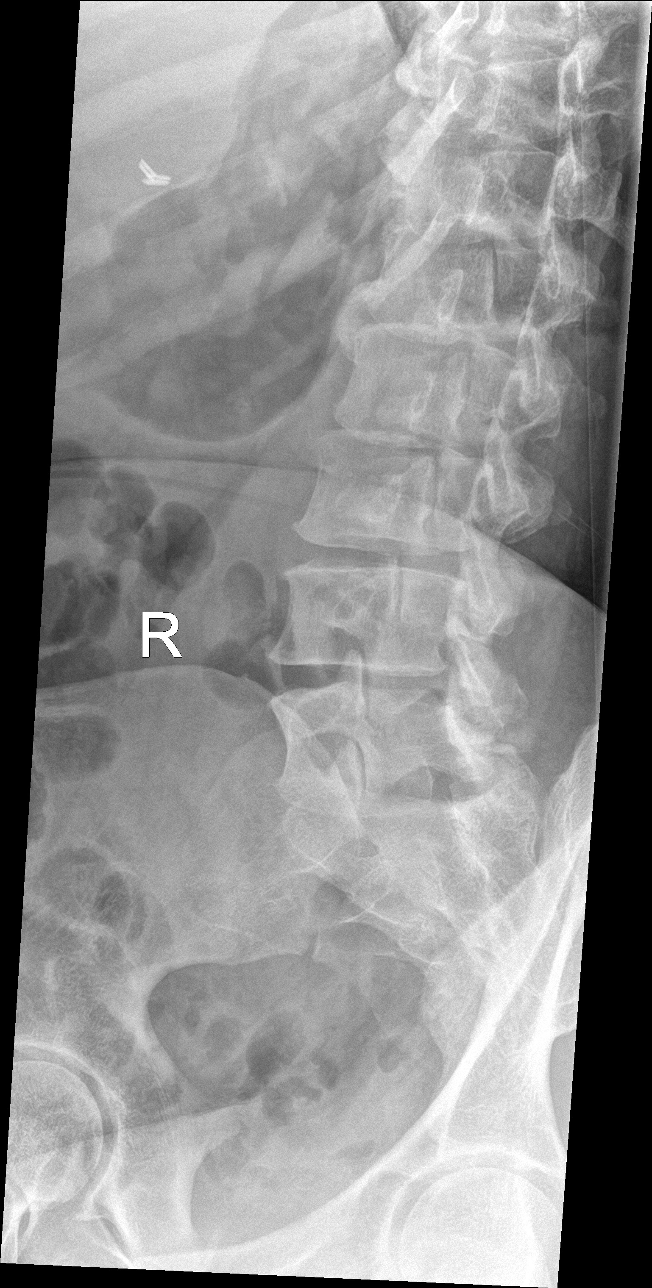

[l-spine obl (2 of 2)]
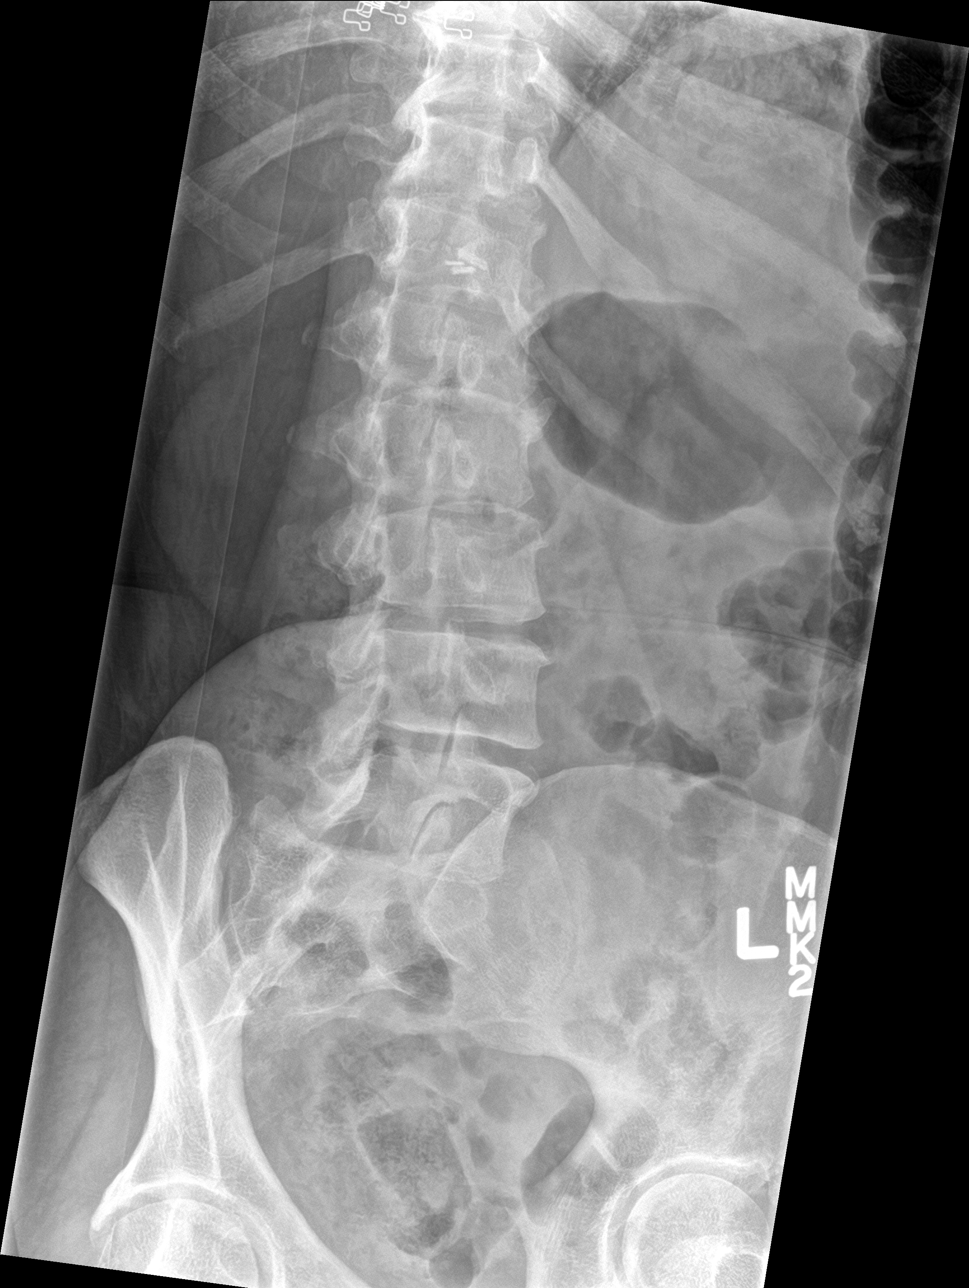

[l-spine lat]
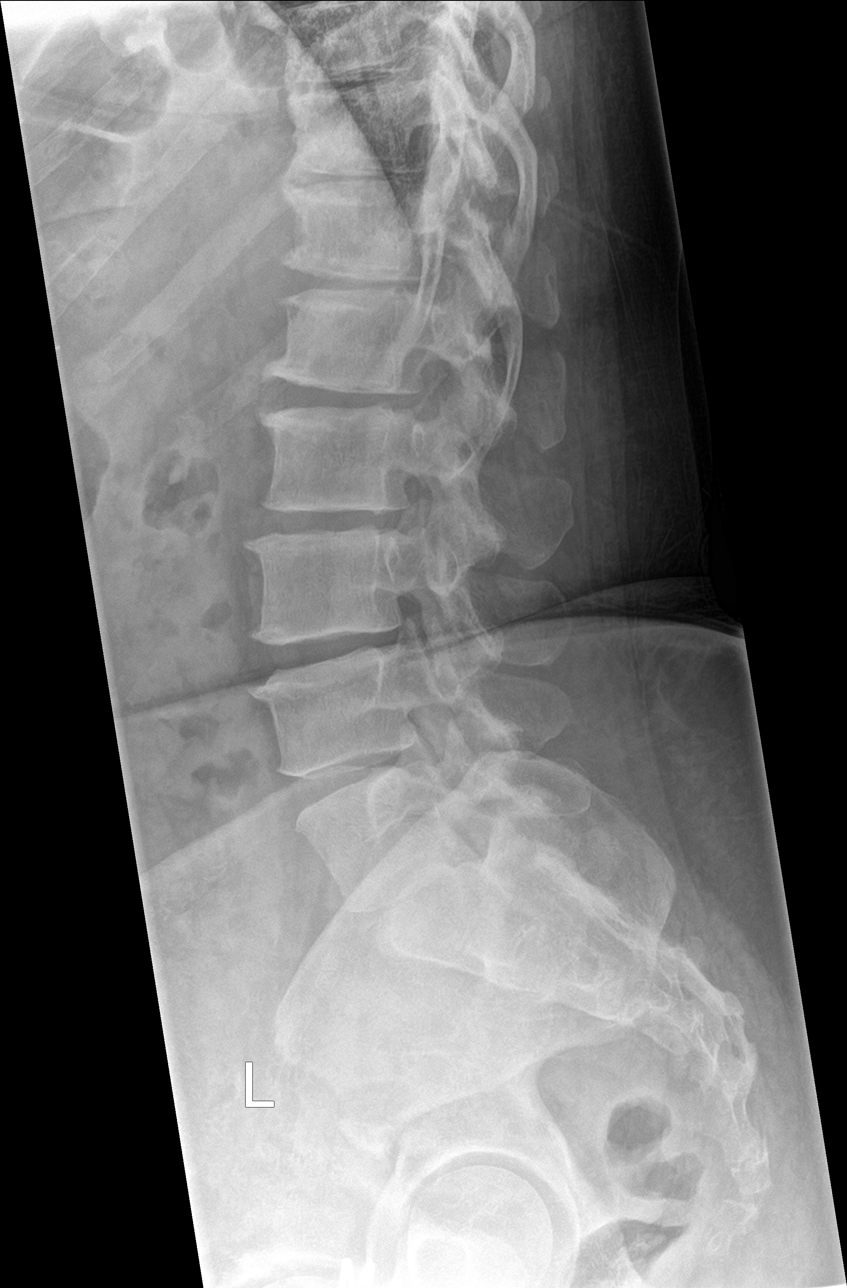

[l-spine spot]
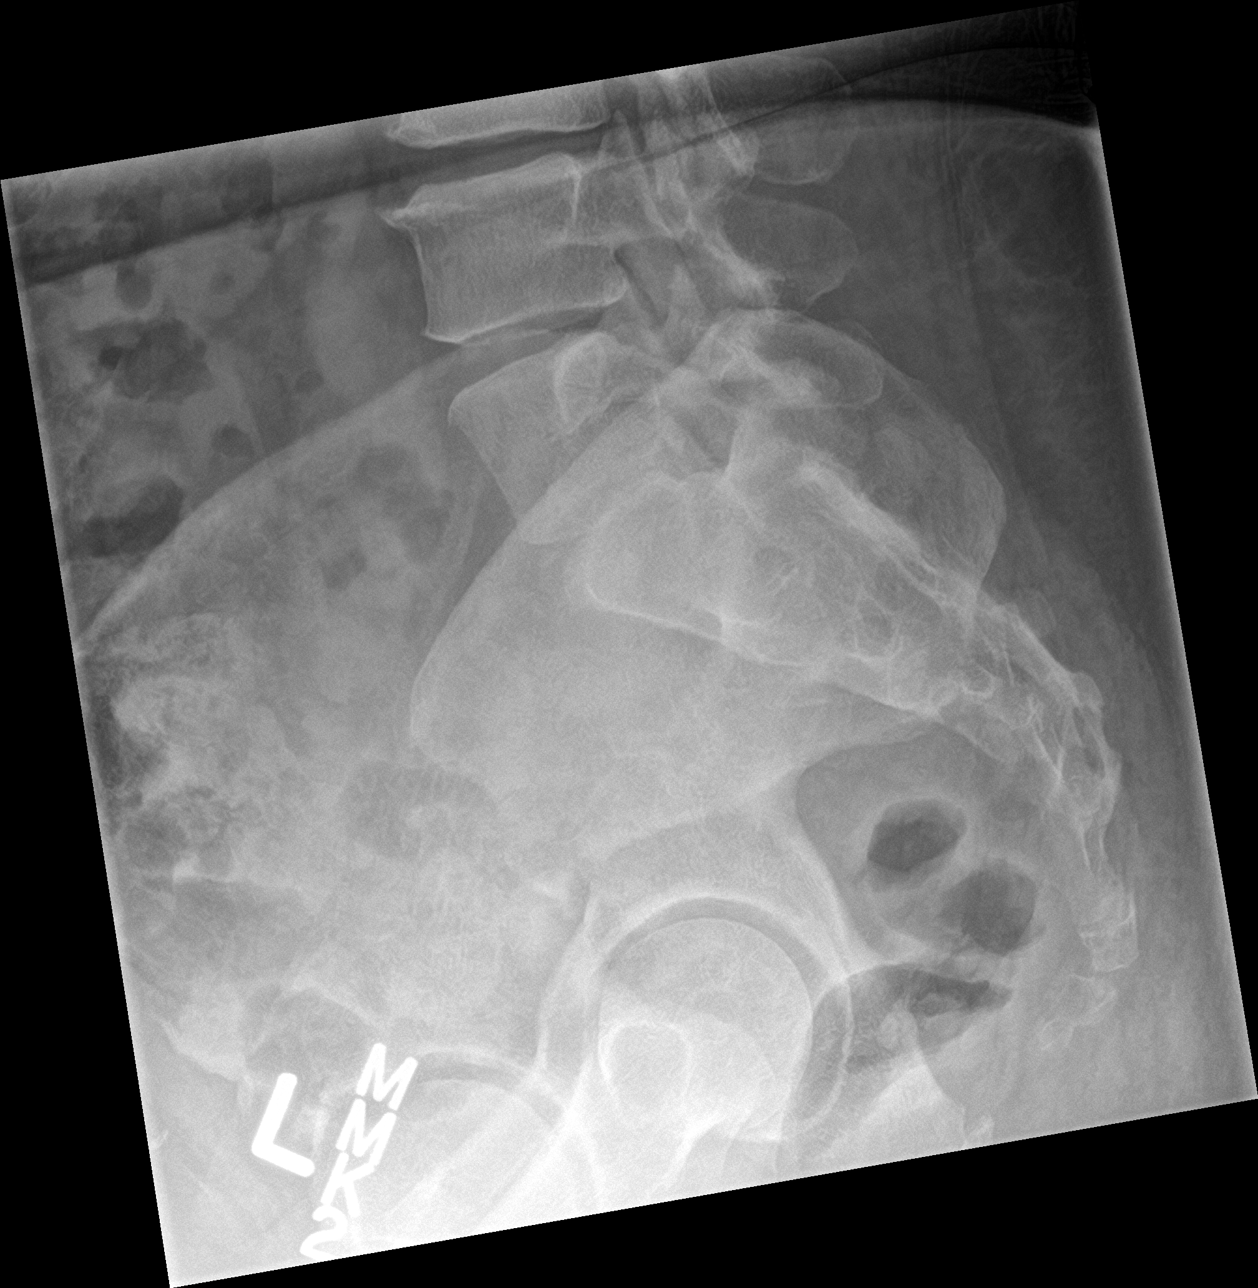

[5 of 5 positions shown; findings below may reference images not displayed]

FINDINGS: 5 lumbar type non-rib-bearing vertebra. Lower thoracic spondylosis.
No pars defects. No significant malalignment or fracture. Spurring
anterior to the vertebral body column of at all levels between T12
and L5.
IMPRESSION: 1. Mild to moderate lumbar spondylosis with relatively preserved
intervertebral disc height. No significant facet arthropathy. No
subluxation or fracture identified.

## 2017-08-29 ENCOUNTER — Ambulatory Visit: Payer: Medicaid Other | Attending: Family Medicine | Admitting: Family Medicine

## 2017-08-29 ENCOUNTER — Encounter: Payer: Self-pay | Admitting: Family Medicine

## 2017-08-29 VITALS — BP 103/66 | HR 65 | Temp 98.0°F | Ht 64.0 in | Wt 239.8 lb

## 2017-08-29 DIAGNOSIS — Z79899 Other long term (current) drug therapy: Secondary | ICD-10-CM | POA: Diagnosis not present

## 2017-08-29 DIAGNOSIS — K219 Gastro-esophageal reflux disease without esophagitis: Secondary | ICD-10-CM | POA: Diagnosis not present

## 2017-08-29 DIAGNOSIS — Z9889 Other specified postprocedural states: Secondary | ICD-10-CM | POA: Insufficient documentation

## 2017-08-29 DIAGNOSIS — Z79891 Long term (current) use of opiate analgesic: Secondary | ICD-10-CM | POA: Diagnosis not present

## 2017-08-29 DIAGNOSIS — J4530 Mild persistent asthma, uncomplicated: Secondary | ICD-10-CM | POA: Insufficient documentation

## 2017-08-29 DIAGNOSIS — E876 Hypokalemia: Secondary | ICD-10-CM | POA: Insufficient documentation

## 2017-08-29 DIAGNOSIS — Z9049 Acquired absence of other specified parts of digestive tract: Secondary | ICD-10-CM | POA: Diagnosis not present

## 2017-08-29 DIAGNOSIS — M5136 Other intervertebral disc degeneration, lumbar region: Secondary | ICD-10-CM | POA: Insufficient documentation

## 2017-08-29 DIAGNOSIS — Z7952 Long term (current) use of systemic steroids: Secondary | ICD-10-CM | POA: Diagnosis not present

## 2017-08-29 DIAGNOSIS — J328 Other chronic sinusitis: Secondary | ICD-10-CM

## 2017-08-29 DIAGNOSIS — M5442 Lumbago with sciatica, left side: Secondary | ICD-10-CM | POA: Diagnosis not present

## 2017-08-29 DIAGNOSIS — J329 Chronic sinusitis, unspecified: Secondary | ICD-10-CM | POA: Diagnosis not present

## 2017-08-29 DIAGNOSIS — G4730 Sleep apnea, unspecified: Secondary | ICD-10-CM | POA: Diagnosis not present

## 2017-08-29 DIAGNOSIS — I10 Essential (primary) hypertension: Secondary | ICD-10-CM | POA: Insufficient documentation

## 2017-08-29 DIAGNOSIS — G8929 Other chronic pain: Secondary | ICD-10-CM

## 2017-08-29 MED ORDER — ACETAMINOPHEN-CODEINE #3 300-30 MG PO TABS: 1.0000 | ORAL_TABLET | Freq: Two times a day (BID) | ORAL | 1 refills | Status: DC | PRN

## 2017-08-29 MED ORDER — METOPROLOL SUCCINATE ER 25 MG PO TB24: 12.5000 mg | ORAL_TABLET | Freq: Every day | ORAL | 1 refills | Status: DC

## 2017-08-29 MED ORDER — ALBUTEROL SULFATE HFA 108 (90 BASE) MCG/ACT IN AERS: 2.0000 | INHALATION_SPRAY | Freq: Two times a day (BID) | RESPIRATORY_TRACT | 3 refills | Status: DC

## 2017-08-29 MED ORDER — OMEPRAZOLE 20 MG PO CPDR: 20.0000 mg | DELAYED_RELEASE_CAPSULE | Freq: Every day | ORAL | 3 refills | Status: DC

## 2017-08-29 MED ORDER — BUDESONIDE-FORMOTEROL FUMARATE 160-4.5 MCG/ACT IN AERO: 2.0000 | INHALATION_SPRAY | Freq: Two times a day (BID) | RESPIRATORY_TRACT | 3 refills | Status: DC

## 2017-08-29 MED ORDER — NAPROXEN 500 MG PO TABS: 500.0000 mg | ORAL_TABLET | Freq: Two times a day (BID) | ORAL | 3 refills | Status: DC

## 2017-08-29 MED ORDER — CETIRIZINE HCL 10 MG PO TABS: 10.0000 mg | ORAL_TABLET | Freq: Every day | ORAL | 2 refills | Status: DC

## 2017-08-29 MED ORDER — FLUTICASONE PROPIONATE 50 MCG/ACT NA SUSP: 2.0000 | Freq: Every day | NASAL | 3 refills | Status: DC

## 2017-08-29 NOTE — Patient Instructions (Signed)

## 2017-08-29 NOTE — Progress Notes (Signed)
Subjective:  Patient ID: [name], female    DOB: 04-12-1966  Age: 51 y.o. MRN: [id number]  CC: Hypertension and Back Pain   HPI [name] is a 51 year old female with a history of hypertension who comes in for a follow up of Hypertension, Asthma, GERD and low back pain.  She complains her back pain is uncontrolled on naproxen, Flexeril and Tylenol No. 3.  The pain is described as severe and radiates down her left lower extremities but she denies numbness in extremities or recent falls. I referred her for an MRI of her lumbar spine which she never underwent; she attributes this to losing her dad and not having time for it. She denies recent falls, loss of sphincteric functions. She has gained 10 pounds in the last 3 months.  Her asthma is controlled and she denies any recent flares. She has been compliant with her antihypertensives and denies any side effects from her medications. Reflux symptoms are controlled.  Past Medical History:  Diagnosis Date  . Asthma   . Gastric reflux   . History of blood transfusion yrs agio, none recent  . Hypertension   . Osteoarthritis   . Sleep apnea    cpap machine has not used since 2014 due to machine/tubing in bad shape   . Syncope and collapse yrs ago    Past Surgical History:  Procedure Laterality Date  . CARDIAC CATHETERIZATION     more than 5 years ago  . CARDIAC CATHETERIZATION  06-07-2012   [facility]: Normal coronary arteries. No significant LVOT/aortic valve gradient  . CHOLECYSTECTOMY    . COLONOSCOPY WITH PROPOFOL N/A 02/27/2014   Procedure: COLONOSCOPY WITH PROPOFOL;  Surgeon: Charolett Bumpers, MD;  Location: [facility];  Service: Endoscopy;  Laterality: N/A;  . ESOPHAGOGASTRODUODENOSCOPY (EGD) WITH PROPOFOL N/A 02/27/2014   Procedure: ESOPHAGOGASTRODUODENOSCOPY (EGD) WITH PROPOFOL;  Surgeon: Charolett Bumpers, MD;  Location: [facility];  Service: Endoscopy;  Laterality: N/A;  . EXPLORATORY LAPAROTOMY  09/2001   ELAP,  LOA, R partial oophorectomy, repair of bowel injury  . VAGINAL HYSTERECTOMY  2001   Adnexa left in place    Allergies  Allergen Reactions  . No Known Allergies      Outpatient Medications Prior to Visit  Medication Sig Dispense Refill  . cyclobenzaprine (FLEXERIL) 10 MG tablet Take 1 tablet (10 mg total) by mouth 2 (two) times daily as needed for muscle spasms. 60 tablet 3  . acetaminophen-codeine (TYLENOL #3) 300-30 MG tablet Take 1 tablet by mouth every 12 (twelve) hours as needed for moderate pain. 60 tablet 1  . albuterol (PROVENTIL HFA;VENTOLIN HFA) 108 (90 Base) MCG/ACT inhaler Inhale 2 puffs into the lungs 2 (two) times daily. 1 Inhaler 3  . budesonide-formoterol (SYMBICORT) 160-4.5 MCG/ACT inhaler Inhale 2 puffs into the lungs 2 (two) times daily. 1 Inhaler 3  . fluticasone (FLONASE) 50 MCG/ACT nasal spray Place 2 sprays into both nostrils daily. 16 g 3  . metoprolol succinate (TOPROL-XL) 25 MG 24 hr tablet TAKE 1/2 TABLET BY MOUTH DAILY 45 tablet 0  . naproxen (NAPROSYN) 500 MG tablet Take 1 tablet (500 mg total) by mouth 2 (two) times daily with a meal. 120 tablet 1  . predniSONE (DELTASONE) 20 MG tablet Take 1 tablet (20 mg total) by mouth 2 (two) times daily with a meal. 10 tablet 0  . promethazine (PHENERGAN) 25 MG tablet Take 1 tablet (25 mg total) by mouth every 12 (twelve) hours as needed for nausea or vomiting. 20  tablet 0  . carbamide peroxide (DEBROX) 6.5 % OTIC solution Place 5 drops into both ears 2 (two) times daily. (Patient not taking: Reported on 08/29/2017) 15 mL 1  . cetirizine (ZYRTEC) 10 MG tablet Take 1 tablet (10 mg total) by mouth daily. 30 tablet 2  . omeprazole (PRILOSEC) 20 MG capsule Take 1 capsule (20 mg total) by mouth daily. 30 capsule 3   No facility-administered medications prior to visit.     ROS Review of Systems  Constitutional: Negative for activity change, appetite change and fatigue.  HENT: Negative for congestion, sinus pressure and sore  throat.   Eyes: Negative for visual disturbance.  Respiratory: Negative for cough, chest tightness, shortness of breath and wheezing.   Cardiovascular: Negative for chest pain and palpitations.  Gastrointestinal: Negative for abdominal distention, abdominal pain and constipation.  Endocrine: Negative for polydipsia.  Genitourinary: Negative for dysuria and frequency.  Musculoskeletal: Positive for back pain. Negative for arthralgias.  Skin: Negative for rash.  Neurological: Negative for tremors, light-headedness and numbness.  Hematological: Does not bruise/bleed easily.  Psychiatric/Behavioral: Negative for agitation and behavioral problems.    Objective:  BP 103/66   Pulse 65   Temp 98 F (36.7 C) (Oral)   Ht 5\' 4"  (1.626 m)   Wt 239 lb 12.8 oz (108.8 kg)   SpO2 99%   BMI 41.16 kg/m   BP/Weight 08/29/2017 06/29/2017 05/29/2017  Systolic BP 103 116 116  Diastolic BP 66 81 77  Wt. (Lbs) 239.8 - 229  BMI 41.16 - 39.31      Physical Exam  Constitutional: She is oriented to person, place, and time. She appears well-developed and well-nourished.  Cardiovascular: Normal rate and intact distal pulses.  Murmur (2/6 systolic) heard. Pulmonary/Chest: Effort normal and breath sounds normal. She has no wheezes. She has no rales. She exhibits no tenderness.  Abdominal: Soft. Bowel sounds are normal. She exhibits no distension and no mass. There is no tenderness.  Musculoskeletal: Normal range of motion. She exhibits tenderness (TTP of midline lumbar region; positive straight leg raise on the left).  Neurological: She is alert and oriented to person, place, and time.  Skin: Skin is warm and dry.  Psychiatric: She has a normal mood and affect.     Assessment & Plan:   1. Chronic left-sided low back pain with left-sided sciatica Uncontrolled Referred for MRI of the lumbar spine which she never-this has been rescheduled We will consider referral for epidural spinal injections if  symptoms persist - acetaminophen-codeine (TYLENOL #3) 300-30 MG tablet; Take 1 tablet by mouth every 12 (twelve) hours as needed for moderate pain.  Dispense: 60 tablet; Refill: 1 - naproxen (NAPROSYN) 500 MG tablet; Take 1 tablet (500 mg total) by mouth 2 (two) times daily with a meal.  Dispense: 60 tablet; Refill: 3  2. Other intervertebral disc degeneration, lumbar region Uncontrolled Recent weight gain 3 months could have worsen symptoms Discussed the need to lose weight by means of exercise, reducing portion sizes  3. Gastroesophageal reflux disease, esophagitis presence not specified Controlled - omeprazole (PRILOSEC) 20 MG capsule; Take 1 capsule (20 mg total) by mouth daily.  Dispense: 30 capsule; Refill: 3  4. Other chronic sinusitis Stable - fluticasone (FLONASE) 50 MCG/ACT nasal spray; Place 2 sprays into both nostrils daily.  Dispense: 16 g; Refill: 3 - cetirizine (ZYRTEC) 10 MG tablet; Take 1 tablet (10 mg total) by mouth daily.  Dispense: 30 tablet; Refill: 2  5. Mild persistent asthma without complication No  acute exacerbations - budesonide-formoterol (SYMBICORT) 160-4.5 MCG/ACT inhaler; Inhale 2 puffs into the lungs 2 (two) times daily.  Dispense: 1 Inhaler; Refill: 3 - albuterol (PROVENTIL HFA;VENTOLIN HFA) 108 (90 Base) MCG/ACT inhaler; Inhale 2 puffs into the lungs 2 (two) times daily.  Dispense: 1 Inhaler; Refill: 3  6. Hypokalemia Potassium was 3.2 We will repeat  Increase intake of foods rich in potassium  Meds ordered this encounter  Medications  . acetaminophen-codeine (TYLENOL #3) 300-30 MG tablet    Sig: Take 1 tablet by mouth every 12 (twelve) hours as needed for moderate pain.    Dispense:  60 tablet    Refill:  1  . omeprazole (PRILOSEC) 20 MG capsule    Sig: Take 1 capsule (20 mg total) by mouth daily.    Dispense:  30 capsule    Refill:  3  . naproxen (NAPROSYN) 500 MG tablet    Sig: Take 1 tablet (500 mg total) by mouth 2 (two) times daily with a  meal.    Dispense:  60 tablet    Refill:  3  . metoprolol succinate (TOPROL-XL) 25 MG 24 hr tablet    Sig: Take 0.5 tablets (12.5 mg total) by mouth daily.    Dispense:  45 tablet    Refill:  1  . fluticasone (FLONASE) 50 MCG/ACT nasal spray    Sig: Place 2 sprays into both nostrils daily.    Dispense:  16 g    Refill:  3  . cetirizine (ZYRTEC) 10 MG tablet    Sig: Take 1 tablet (10 mg total) by mouth daily.    Dispense:  30 tablet    Refill:  2    To replace BID  . budesonide-formoterol (SYMBICORT) 160-4.5 MCG/ACT inhaler    Sig: Inhale 2 puffs into the lungs 2 (two) times daily.    Dispense:  1 Inhaler    Refill:  3  . albuterol (PROVENTIL HFA;VENTOLIN HFA) 108 (90 Base) MCG/ACT inhaler    Sig: Inhale 2 puffs into the lungs 2 (two) times daily.    Dispense:  1 Inhaler    Refill:  3    Follow-up: Return in about 3 months (around 11/29/2017) for Follow-up on chronic medical conditions.   Jaclyn ShaggyEnobong Amao MD

## 2017-08-30 ENCOUNTER — Encounter: Payer: Self-pay | Admitting: Pharmacist

## 2017-08-30 NOTE — Progress Notes (Signed)
PA submitted and approved for Symbicort x 24 months [place] [facility] [facility]. Prior Approval #: [id number]

## 2017-09-05 ENCOUNTER — Ambulatory Visit: Payer: Medicaid Other | Attending: Family Medicine

## 2017-09-05 ENCOUNTER — Encounter: Payer: Self-pay | Admitting: Pharmacist

## 2017-09-05 DIAGNOSIS — E876 Hypokalemia: Secondary | ICD-10-CM

## 2017-09-05 NOTE — Progress Notes (Signed)
Patient here for lab visit only 

## 2017-09-05 NOTE — Progress Notes (Signed)
PA completed and approved for Tylenol #3. Prior Approval #: [id number]

## 2017-09-06 LAB — BASIC METABOLIC PANEL
BUN/Creatinine Ratio: 13 (ref 9–23)
BUN: 7 mg/dL (ref 6–24)
CO2: 26 mmol/L (ref 20–29)
Calcium: 9.7 mg/dL (ref 8.7–10.2)
Chloride: 106 mmol/L (ref 96–106)
Creatinine, Ser: 0.55 mg/dL — ABNORMAL LOW (ref 0.57–1.00)
GFR calc Af Amer: 124 mL/min/{1.73_m2} (ref >59)
GFR calc non Af Amer: 107 mL/min/{1.73_m2} (ref >59)
Glucose: 63 mg/dL — ABNORMAL LOW (ref 65–99)
Potassium: 3.8 mmol/L (ref 3.5–5.2)
Sodium: 146 mmol/L — ABNORMAL HIGH (ref 134–144)

## 2017-09-07 ENCOUNTER — Telehealth: Payer: Self-pay

## 2017-09-07 NOTE — Telephone Encounter (Signed)
Pt was called and a VM was left informing pt to return phone call for lab results.   If patient returns phone call please inform pt that lab results are stable.

## 2017-09-08 ENCOUNTER — Telehealth: Payer: Self-pay | Admitting: Family Medicine

## 2017-09-09 ENCOUNTER — Ambulatory Visit (HOSPITAL_COMMUNITY): Payer: Medicaid Other

## 2017-09-11 ENCOUNTER — Encounter: Payer: Self-pay | Admitting: Pharmacist

## 2017-09-11 NOTE — Progress Notes (Signed)
[facility] Tylenol #[facility]. Pending approval/refusal.

## 2017-10-11 ENCOUNTER — Other Ambulatory Visit: Payer: Self-pay | Admitting: Family Medicine

## 2017-10-16 ENCOUNTER — Ambulatory Visit (HOSPITAL_COMMUNITY)
Admission: EM | Admit: 2017-10-16 | Discharge: 2017-10-16 | Disposition: A | Discharge status: Home / Self Care | Payer: Medicaid Other | Attending: Family Medicine | Admitting: Family Medicine

## 2017-10-16 ENCOUNTER — Encounter (HOSPITAL_COMMUNITY): Payer: Self-pay | Admitting: Emergency Medicine

## 2017-10-16 DIAGNOSIS — J0100 Acute maxillary sinusitis, unspecified: Secondary | ICD-10-CM | POA: Diagnosis not present

## 2017-10-16 DIAGNOSIS — H6501 Acute serous otitis media, right ear: Secondary | ICD-10-CM

## 2017-10-16 MED ORDER — BENZONATATE 100 MG PO CAPS: 100.0000 mg | ORAL_CAPSULE | Freq: Three times a day (TID) | ORAL | 0 refills | Status: DC | PRN

## 2017-10-16 MED ORDER — AMOXICILLIN-POT CLAVULANATE 875-125 MG PO TABS: 1.0000 | ORAL_TABLET | Freq: Two times a day (BID) | ORAL | 0 refills | Status: AC

## 2017-10-16 NOTE — Discharge Instructions (Addendum)
Recommend start Augmentin 875mg  twice a day as directed. Take Tessalon cough pills- 1 every 8 hours as needed for cough. Continue to increase fluid intake to help loosen up mucus. May take Tylenol as needed for pain. Follow-up with your PCP in 4 to 5 days if not improving.

## 2017-10-16 NOTE — ED Provider Notes (Signed)
[facility]    CSN: [id number] Arrival date & time: 10/16/17  1000     History   Chief Complaint Chief Complaint  Patient presents with  . URI    HPI [name] is a 52 y.o. female.   52 year old female presents with headache, nasal congestion, cough, sore throat for over 1 week. Has also had decreased appetite and unable to sleep the past few nights due to cough and congestion. Denies any fever or vomiting/diarrhea. Yesterday started having right ear pain that has gotten worse along with chest tightness and sinus pressure. Has taken Coricidan decongestant pills with no relief. Does not smoke. Has history of HTN, asthma, seasonal allergies, arthritis, and GERD and on multiple maintenance medications daily.    The history is provided by the patient.    Past Medical History:  Diagnosis Date  . Asthma   . Gastric reflux   . History of blood transfusion yrs agio, none recent  . Hypertension   . Osteoarthritis   . Sleep apnea    cpap machine has not used since 2014 due to machine/tubing in bad shape   . Syncope and collapse yrs ago    Patient Active Problem List   Diagnosis Date Noted  . Itching with irritation 06/23/2016  . Gastric reflux 05/04/2016  . Asthma 05/04/2016  . Essential hypertension 07/23/2015  . Mild depression (HCC) 07/23/2015  . Chest pain 06/06/2012  . Murmur, cardiac 06/06/2012    Past Surgical History:  Procedure Laterality Date  . CARDIAC CATHETERIZATION     more than 5 years ago  . CARDIAC CATHETERIZATION  06-07-2012   [facility]: Normal coronary arteries. No significant LVOT/aortic valve gradient  . CHOLECYSTECTOMY    . COLONOSCOPY WITH PROPOFOL N/A 02/27/2014   Procedure: COLONOSCOPY WITH PROPOFOL;  Surgeon: Charolett Bumpers, MD;  Location: [facility];  Service: Endoscopy;  Laterality: N/A;  . ESOPHAGOGASTRODUODENOSCOPY (EGD) WITH PROPOFOL N/A 02/27/2014   Procedure: ESOPHAGOGASTRODUODENOSCOPY (EGD) WITH PROPOFOL;  Surgeon: Charolett Bumpers, MD;  Location: [facility];  Service: Endoscopy;  Laterality: N/A;  . EXPLORATORY LAPAROTOMY  09/2001   ELAP, LOA, R partial oophorectomy, repair of bowel injury  . VAGINAL HYSTERECTOMY  2001   Adnexa left in place    OB History    Gravida Para Term Preterm AB Living   2 2 2     2    SAB TAB Ectopic Multiple Live Births                   Home Medications    Prior to Admission medications   Medication Sig Start Date End Date Taking? Authorizing Provider  albuterol (PROVENTIL HFA;VENTOLIN HFA) 108 (90 Base) MCG/ACT inhaler Inhale 2 puffs into the lungs 2 (two) times daily. 08/29/17  Yes Jaclyn Shaggy, MD  budesonide-formoterol (SYMBICORT) 160-4.5 MCG/ACT inhaler Inhale 2 puffs into the lungs 2 (two) times daily. 08/29/17  Yes Jaclyn Shaggy, MD  cetirizine (ZYRTEC) 10 MG tablet Take 1 tablet (10 mg total) by mouth daily. 08/29/17  Yes Jaclyn Shaggy, MD  cyclobenzaprine (FLEXERIL) 10 MG tablet Take 1 tablet (10 mg total) by mouth 2 (two) times daily as needed for muscle spasms. 04/25/17  Yes Jaclyn Shaggy, MD  metoprolol succinate (TOPROL-XL) 25 MG 24 hr tablet Take 0.5 tablets (12.5 mg total) by mouth daily. 08/29/17  Yes Jaclyn Shaggy, MD  naproxen (NAPROSYN) 500 MG tablet Take 1 tablet (500 mg total) by mouth 2 (two) times daily with a meal. 08/29/17  Yes Jaclyn ShaggyAmao, Enobong, MD  acetaminophen-codeine (TYLENOL #3) 300-30 MG tablet Take 1 tablet by mouth every 12 (twelve) hours as needed for moderate pain. 08/29/17   Jaclyn ShaggyAmao, Enobong, MD  amoxicillin-clavulanate (AUGMENTIN) 875-125 MG tablet Take 1 tablet by mouth 2 (two) times daily for 7 days. 10/16/17 10/23/17  Sudie GrumblingAmyot, Sherrick Araki Berry, NP  benzonatate (TESSALON) 100 MG capsule Take 1 capsule (100 mg total) by mouth 3 (three) times daily as needed for cough. 10/16/17   Sudie GrumblingAmyot, Richa Shor Berry, NP  omeprazole (PRILOSEC) 20 MG capsule Take 1 capsule (20 mg total) by mouth daily. Patient taking differently: Take 40 mg by mouth daily.  08/29/17 09/28/17  Jaclyn ShaggyAmao,  Enobong, MD    Family History No family history on file.  Social History Social History   Tobacco Use  . Smoking status: Never Smoker  . Smokeless tobacco: Never Used  Substance Use Topics  . Alcohol use: No  . Drug use: No     Allergies   No known allergies   Review of Systems Review of Systems  Constitutional: Positive for activity change, appetite change, chills and fatigue. Negative for fever.  HENT: Positive for congestion, ear pain (right), postnasal drip, rhinorrhea, sinus pressure, sinus pain and sore throat. Negative for ear discharge, mouth sores, nosebleeds, sneezing and trouble swallowing.   Eyes: Negative for pain, discharge, redness and itching.  Respiratory: Positive for cough and chest tightness. Negative for shortness of breath and wheezing.   Cardiovascular: Negative for chest pain.  Gastrointestinal: Positive for nausea. Negative for abdominal pain, diarrhea and vomiting.  Musculoskeletal: Positive for arthralgias. Negative for neck pain and neck stiffness.  Skin: Negative for rash and wound.  Neurological: Positive for light-headedness and headaches. Negative for dizziness, tremors, seizures, syncope, speech difficulty, weakness and numbness.  Hematological: Negative for adenopathy. Does not bruise/bleed easily.     Physical Exam Triage Vital Signs ED Triage Vitals  Enc Vitals Group     BP 10/16/17 1020 131/90     Pulse Rate 10/16/17 1020 95     Resp 10/16/17 1020 18     Temp 10/16/17 1020 98.7 F (37.1 C)     Temp Source 10/16/17 1020 Oral     SpO2 10/16/17 1020 97 %     Weight 10/16/17 1018 224 lb (101.6 kg)     Height 10/16/17 1018 5\' 4"  (1.626 m)     Head Circumference --      Peak Flow --      Pain Score 10/16/17 1018 10     Pain Loc --      Pain Edu? --      Excl. in GC? --    No data found.  Updated Vital Signs BP 131/90   Pulse 95   Temp 98.7 F (37.1 C) (Oral)   Resp 18   Ht 5\' 4"  (1.626 m)   Wt 224 lb (101.6 kg)   SpO2  97%   BMI 38.45 kg/m   Visual Acuity Right Eye Distance:   Left Eye Distance:   Bilateral Distance:    Right Eye Near:   Left Eye Near:    Bilateral Near:     Physical Exam  Constitutional: She is oriented to person, place, and time. She appears well-developed and well-nourished. She appears ill. No distress.  HENT:  Head: Normocephalic and atraumatic.  Right Ear: Hearing, external ear and ear canal normal. No drainage. Tympanic membrane is injected, erythematous and bulging. Tympanic membrane is not scarred and not perforated. A middle  ear effusion is present.  Left Ear: Hearing, external ear and ear canal normal. Tympanic membrane is bulging. Tympanic membrane is not injected, not perforated and not erythematous.  Nose: Mucosal edema and rhinorrhea present. Right sinus exhibits maxillary sinus tenderness. Right sinus exhibits no frontal sinus tenderness. Left sinus exhibits maxillary sinus tenderness. Left sinus exhibits no frontal sinus tenderness.  Mouth/Throat: Uvula is midline and mucous membranes are normal. She has dentures. Posterior oropharyngeal erythema present.  Eyes: Conjunctivae and EOM are normal.  Neck: Normal range of motion. Neck supple.  Cardiovascular: Normal rate, regular rhythm and normal heart sounds.  No murmur heard. Pulmonary/Chest: Effort normal. No respiratory distress. She has decreased breath sounds in the right upper field, the right lower field, the left upper field and the left lower field. She has wheezes in the right upper field and the left upper field. She has no rhonchi. She has no rales.  Lymphadenopathy:    She has no cervical adenopathy.  Neurological: She is alert and oriented to person, place, and time.  Skin: Skin is warm and dry. Capillary refill takes less than 2 seconds. No rash noted.  Psychiatric: She has a normal mood and affect. Her behavior is normal. Judgment and thought content normal.     UC Treatments / Results  Labs (all  labs ordered are listed, but only abnormal results are displayed) Labs Reviewed - No data to display  EKG  EKG Interpretation None       Radiology No results found.  Procedures Procedures (including critical care time)  Medications Ordered in UC Medications - No data to display   Initial Impression / Assessment and Plan / UC Course  I have reviewed the triage vital signs and the nursing notes.  Pertinent labs & imaging results that were available during my care of the patient were reviewed by me and considered in my medical decision making (see chart for details).    Discussed with the patient that she has an ear infection and probable sinus infection. Recommend start Augmentin 875mg  twice a day as directed. Take Tessalon cough pills- 1 every 8 hours as needed for cough. Continue to increase fluid intake to help loosen up mucus. May take Tylenol 1000mg  every 8 hours as needed for pain. Follow-up with her PCP in 4 to 5 days if not improving.    Final Clinical Impressions(s) / UC Diagnoses   Final diagnoses:  Right acute serous otitis media, recurrence not specified  Acute non-recurrent maxillary sinusitis    ED Discharge Orders        Ordered    amoxicillin-clavulanate (AUGMENTIN) 875-125 MG tablet  2 times daily     10/16/17 1042    benzonatate (TESSALON) 100 MG capsule  3 times daily PRN     10/16/17 1042       Controlled Substance Prescriptions [place] Controlled Substance Registry consulted? Not Applicable   Sudie Grumbling, NP 10/16/17 1434

## 2017-10-16 NOTE — ED Triage Notes (Signed)
PT reports cough, congestion, drainage, ear pain and loss of appetite for 1 week.

## 2017-10-19 ENCOUNTER — Ambulatory Visit: Payer: Medicaid Other

## 2017-10-30 ENCOUNTER — Encounter: Payer: Self-pay | Admitting: Family Medicine

## 2017-10-30 ENCOUNTER — Telehealth: Payer: Self-pay

## 2017-10-30 ENCOUNTER — Ambulatory Visit: Payer: Medicaid Other | Attending: Family Medicine | Admitting: Family Medicine

## 2017-10-30 ENCOUNTER — Other Ambulatory Visit: Payer: Self-pay | Admitting: Family Medicine

## 2017-10-30 VITALS — BP 122/77 | HR 68 | Temp 97.5°F | Ht 64.0 in | Wt 235.0 lb

## 2017-10-30 DIAGNOSIS — Z79899 Other long term (current) drug therapy: Secondary | ICD-10-CM | POA: Diagnosis not present

## 2017-10-30 DIAGNOSIS — Z23 Encounter for immunization: Secondary | ICD-10-CM | POA: Diagnosis not present

## 2017-10-30 DIAGNOSIS — Z0000 Encounter for general adult medical examination without abnormal findings: Secondary | ICD-10-CM | POA: Diagnosis not present

## 2017-10-30 DIAGNOSIS — Z1239 Encounter for other screening for malignant neoplasm of breast: Secondary | ICD-10-CM

## 2017-10-30 DIAGNOSIS — I10 Essential (primary) hypertension: Secondary | ICD-10-CM | POA: Diagnosis not present

## 2017-10-30 DIAGNOSIS — G8929 Other chronic pain: Secondary | ICD-10-CM | POA: Diagnosis not present

## 2017-10-30 DIAGNOSIS — R011 Cardiac murmur, unspecified: Secondary | ICD-10-CM | POA: Diagnosis not present

## 2017-10-30 DIAGNOSIS — Z1231 Encounter for screening mammogram for malignant neoplasm of breast: Secondary | ICD-10-CM

## 2017-10-30 DIAGNOSIS — K219 Gastro-esophageal reflux disease without esophagitis: Secondary | ICD-10-CM | POA: Insufficient documentation

## 2017-10-30 DIAGNOSIS — Z90710 Acquired absence of both cervix and uterus: Secondary | ICD-10-CM | POA: Insufficient documentation

## 2017-10-30 DIAGNOSIS — J45909 Unspecified asthma, uncomplicated: Secondary | ICD-10-CM | POA: Insufficient documentation

## 2017-10-30 DIAGNOSIS — M5442 Lumbago with sciatica, left side: Secondary | ICD-10-CM | POA: Diagnosis not present

## 2017-10-30 DIAGNOSIS — M545 Low back pain, unspecified: Secondary | ICD-10-CM | POA: Insufficient documentation

## 2017-10-30 DIAGNOSIS — G4730 Sleep apnea, unspecified: Secondary | ICD-10-CM | POA: Diagnosis not present

## 2017-10-30 DIAGNOSIS — M1990 Unspecified osteoarthritis, unspecified site: Secondary | ICD-10-CM | POA: Diagnosis not present

## 2017-10-30 MED ORDER — ACETAMINOPHEN-CODEINE #3 300-30 MG PO TABS: 1.0000 | ORAL_TABLET | Freq: Two times a day (BID) | ORAL | 1 refills | Status: DC | PRN

## 2017-10-30 NOTE — Patient Instructions (Signed)

## 2017-10-30 NOTE — Telephone Encounter (Signed)
Met with the patient when she was in the clinic today.  She completed a SCAT application and the application was faxed to SCAT eligibility - fax # [phone].

## 2017-10-30 NOTE — Progress Notes (Signed)
Subjective:  Patient ID: [name], female    DOB: 07/06/1966  Age: 52 y.o. MRN: [id number]  CC: Annual Exam  HPI Dally Oshel is a 52 year old female with a PMH of hypertension, asthma, hysterectomy, low back pain and a heart murmur that presents today for her annual physical exam. She was recently seen at an Urgent care on 10/16/17 for acute serous otitis medica and acute maxillary sinusitis. Today, she reports improvement of her symptoms, with only post-nasal drip present.   She continues to complain of lower-back pain but now reports shooting pain from her lower back to her left leg when standing or walking for the past 2 weeks. She takes Tylenol #3, but it provides little to no relief. She denies numbness and tingling of the left lower extremity. Her MRI of the spine has been scheduled but she has yet to go.   She reports adherence to her medications and tries to check her BP everyday. She denies chest pains, SOB, palpitation, headaches, or fevers.    Additionally, she has not had a mammogram for the past 2 years and is not up-to date with her  Tdap or influenza vaccine. She is agreeable to receiving those today.    Past Medical History:  Diagnosis Date  . Asthma   . Gastric reflux   . History of blood transfusion yrs agio, none recent  . Hypertension   . Osteoarthritis   . Sleep apnea    cpap machine has not used since 2014 due to machine/tubing in bad shape   . Syncope and collapse yrs ago   Past Surgical History:  Procedure Laterality Date  . CARDIAC CATHETERIZATION     more than 5 years ago  . CARDIAC CATHETERIZATION  06-07-2012   [facility]: Normal coronary arteries. No significant LVOT/aortic valve gradient  . CHOLECYSTECTOMY    . COLONOSCOPY WITH PROPOFOL N/A 02/27/2014   Procedure: COLONOSCOPY WITH PROPOFOL;  Surgeon: Charolett Bumpers, MD;  Location: [facility];  Service: Endoscopy;  Laterality: N/A;  . ESOPHAGOGASTRODUODENOSCOPY (EGD) WITH PROPOFOL N/A 02/27/2014   Procedure: ESOPHAGOGASTRODUODENOSCOPY (EGD) WITH PROPOFOL;  Surgeon: Charolett Bumpers, MD;  Location: [facility];  Service: Endoscopy;  Laterality: N/A;  . EXPLORATORY LAPAROTOMY  09/2001   ELAP, LOA, R partial oophorectomy, repair of bowel injury  . VAGINAL HYSTERECTOMY  2001   Adnexa left in place     Allergies  Allergen Reactions  . No Known Allergies     Outpatient Medications Prior to Visit  Medication Sig Dispense Refill  . albuterol (PROVENTIL HFA;VENTOLIN HFA) 108 (90 Base) MCG/ACT inhaler Inhale 2 puffs into the lungs 2 (two) times daily. 1 Inhaler 3  . benzonatate (TESSALON) 100 MG capsule Take 1 capsule (100 mg total) by mouth 3 (three) times daily as needed for cough. 21 capsule 0  . budesonide-formoterol (SYMBICORT) 160-4.5 MCG/ACT inhaler Inhale 2 puffs into the lungs 2 (two) times daily. 1 Inhaler 3  . cetirizine (ZYRTEC) 10 MG tablet Take 1 tablet (10 mg total) by mouth daily. 30 tablet 2  . cyclobenzaprine (FLEXERIL) 10 MG tablet Take 1 tablet (10 mg total) by mouth 2 (two) times daily as needed for muscle spasms. 60 tablet 3  . metoprolol succinate (TOPROL-XL) 25 MG 24 hr tablet Take 0.5 tablets (12.5 mg total) by mouth daily. 45 tablet 1  . naproxen (NAPROSYN) 500 MG tablet Take 1 tablet (500 mg total) by mouth 2 (two) times daily with a meal. 60 tablet 3  . acetaminophen-codeine (  TYLENOL #3) 300-30 MG tablet Take 1 tablet by mouth every 12 (twelve) hours as needed for moderate pain. 60 tablet 1  . omeprazole (PRILOSEC) 20 MG capsule Take 1 capsule (20 mg total) by mouth daily. (Patient taking differently: Take 40 mg by mouth daily. ) 30 capsule 3   No facility-administered medications prior to visit.    ROS Review of Systems  Constitutional: Negative for activity change and fever.  HENT: Positive for congestion and postnasal drip. Negative for ear pain, hearing loss and sore throat.   Respiratory: Negative for cough, chest tightness and shortness of breath.     Cardiovascular: Negative for chest pain and leg swelling.  Gastrointestinal: Negative for abdominal distention, abdominal pain, constipation and diarrhea.  Endocrine: Negative.   Genitourinary: Negative for dysuria, frequency and urgency.  Musculoskeletal: Positive for back pain. Negative for neck pain.       Right leg pain  Skin: Negative.   Neurological: Negative for dizziness, numbness and headaches.  Hematological: Negative.   Psychiatric/Behavioral: The patient is not nervous/anxious.     Objective:  BP 122/77   Pulse 68   Temp (!) 97.5 F (36.4 C) (Oral)   Ht 5\' 4"  (1.626 m)   Wt 235 lb (106.6 kg)   SpO2 99%   BMI 40.34 kg/m   BP/Weight 10/30/2017 10/16/2017 08/29/2017  Systolic BP 122 131 103  Diastolic BP 77 90 66  Wt. (Lbs) 235 224 239.8  BMI 40.34 38.45 41.16   Lab Results  Component Value Date   HGBA1C 5.4 05/29/2017   Lab Results  Component Value Date   TSH 1.70 11/14/2016   Lab Results  Component Value Date   CHOL 171 04/25/2017   HDL 51 04/25/2017   LDLCALC 106 (H) 04/25/2017   TRIG 68 04/25/2017   CHOLHDL 3.4 04/25/2017    Physical Exam  Constitutional: She is oriented to person, place, and time. She appears well-developed and well-nourished. No distress.  HENT:  Head: Normocephalic.  Mouth/Throat: Oropharynx is clear and moist. No oropharyngeal exudate.  Eyes: Pupils are equal, round, and reactive to light.  Neck: Normal range of motion. Neck supple. No thyromegaly present.  Cardiovascular: Normal rate, regular rhythm and intact distal pulses.  Murmur heard.  Systolic murmur is present with a grade of 2/6. Pulmonary/Chest: Effort normal and breath sounds normal. She has no wheezes.  Abdominal: Soft. Bowel sounds are normal. She exhibits no distension. There is no tenderness.  Musculoskeletal: She exhibits no edema.       Lumbar back: She exhibits tenderness and pain.       Left upper leg: She exhibits tenderness (limited ROM).  Positive  straight leg raise test of left leg. Right leg was negative.   Lymphadenopathy:    She has no cervical adenopathy.  Neurological: She is alert and oriented to person, place, and time.  Skin: Skin is warm and dry.  Psychiatric: She has a normal mood and affect. Her behavior is normal.    Assessment & Plan:   1. Annual physical exam  Labs are up to date  2. Screening for breast cancer Educated on importance of mammogram, discussed scheduling.  3. Need for influenza vaccination Flu vaccine given.  4. Need for Tdap vaccination Tdap vaccine given.  5. Chronic left-sided low back pain with left-sided sciatica Discussed importance of MRI of lumbar spine and patient is agreeable to reschedule.  - Ambulatory referral to Physical Therapy - MM Digital Screening; Future - acetaminophen-codeine (TYLENOL #3) 300-30 MG  tablet; Take 1 tablet by mouth every 12 (twelve) hours as needed for moderate pain.  Dispense: 60 tablet; Refill: 1  Meds ordered this encounter  Medications  . acetaminophen-codeine (TYLENOL #3) 300-30 MG tablet    Sig: Take 1 tablet by mouth every 12 (twelve) hours as needed for moderate pain.    Dispense:  60 tablet    Refill:  1    Follow-up: Return in about 3 months (around 01/28/2018) for Follow-up of chronic medical conditions.

## 2017-11-01 ENCOUNTER — Ambulatory Visit
Admission: RE | Admit: 2017-11-01 | Discharge: 2017-11-01 | Disposition: A | Discharge status: Home / Self Care | Payer: Medicaid Other | Source: Ambulatory Visit | Attending: Family Medicine | Admitting: Family Medicine

## 2017-11-01 ENCOUNTER — Ambulatory Visit: Payer: Medicaid Other

## 2017-11-01 DIAGNOSIS — Z1231 Encounter for screening mammogram for malignant neoplasm of breast: Secondary | ICD-10-CM

## 2017-11-02 ENCOUNTER — Telehealth: Payer: Self-pay

## 2017-11-02 NOTE — Telephone Encounter (Signed)
Pt was called and informed of mammogram results. 

## 2017-11-08 ENCOUNTER — Ambulatory Visit (HOSPITAL_COMMUNITY)
Admission: RE | Admit: 2017-11-08 | Discharge: 2017-11-08 | Disposition: A | Discharge status: Home / Self Care | Payer: Medicaid Other | Source: Ambulatory Visit | Attending: Family Medicine | Admitting: Family Medicine

## 2017-11-08 ENCOUNTER — Telehealth: Payer: Self-pay | Admitting: Family Medicine

## 2017-11-08 DIAGNOSIS — R937 Abnormal findings on diagnostic imaging of other parts of musculoskeletal system: Secondary | ICD-10-CM | POA: Diagnosis not present

## 2017-11-08 DIAGNOSIS — M47816 Spondylosis without myelopathy or radiculopathy, lumbar region: Secondary | ICD-10-CM | POA: Diagnosis not present

## 2017-11-08 DIAGNOSIS — M5136 Other intervertebral disc degeneration, lumbar region: Secondary | ICD-10-CM | POA: Diagnosis not present

## 2017-11-08 NOTE — Telephone Encounter (Signed)
Call placed to patient #[phone] regarding her SCAT application. Wanted to check with patient if [employer] had reached out to her and if she got approved for it. No answer. Left patient a message asking her to return my call at #[phone]. Patient can also speak with Erskine SquibbJane.

## 2017-11-08 NOTE — Telephone Encounter (Signed)
Patient returned my call and spoke with [place] and informed her that [employer]AT had no reached out to her yet.

## 2017-11-10 ENCOUNTER — Telehealth: Payer: Self-pay

## 2017-11-10 DIAGNOSIS — M5442 Lumbago with sciatica, left side: Principal | ICD-10-CM

## 2017-11-10 DIAGNOSIS — G8929 Other chronic pain: Secondary | ICD-10-CM

## 2017-11-10 NOTE — Telephone Encounter (Signed)
Referral placed.

## 2017-11-10 NOTE — Telephone Encounter (Signed)
Patient was called and informed of MRI results. Patient states that she would like to have the spinal injections.

## 2017-11-14 ENCOUNTER — Encounter: Payer: Self-pay | Admitting: Physical Therapy

## 2017-11-14 ENCOUNTER — Ambulatory Visit: Payer: Medicaid Other | Attending: Family Medicine | Admitting: Physical Therapy

## 2017-11-14 DIAGNOSIS — M5442 Lumbago with sciatica, left side: Secondary | ICD-10-CM | POA: Insufficient documentation

## 2017-11-14 DIAGNOSIS — M6281 Muscle weakness (generalized): Secondary | ICD-10-CM

## 2017-11-14 DIAGNOSIS — M62830 Muscle spasm of back: Secondary | ICD-10-CM | POA: Insufficient documentation

## 2017-11-14 DIAGNOSIS — R2689 Other abnormalities of gait and mobility: Secondary | ICD-10-CM

## 2017-11-14 DIAGNOSIS — G8929 Other chronic pain: Secondary | ICD-10-CM | POA: Insufficient documentation

## 2017-11-14 DIAGNOSIS — R293 Abnormal posture: Secondary | ICD-10-CM | POA: Diagnosis present

## 2017-11-14 NOTE — Therapy (Signed)
[facility] [address] [place], [place], [address] Phone: [phone]   Fax:  [phone]  Physical Therapy Evaluation  Patient Details  Name: [name] MRN: [id number] Date of Birth: May 28, 1966 Referring Provider: Hoy Register, MD   Encounter Date: 11/14/2017  PT End of Session - 11/14/17 1057    Visit Number  1    Number of Visits  13    Date for PT Re-Evaluation  12/26/17    Authorization Type  MCD    PT Start Time  1018    PT Stop Time  1058    PT Time Calculation (min)  40 min    Activity Tolerance  Patient tolerated treatment well    Behavior During Therapy  [facility] for tasks assessed/performed       Past Medical History:  Diagnosis Date  . Asthma   . Gastric reflux   . History of blood transfusion yrs agio, none recent  . Hypertension   . Osteoarthritis   . Sleep apnea    cpap machine has not used since 2014 due to machine/tubing in bad shape   . Syncope and collapse yrs ago    Past Surgical History:  Procedure Laterality Date  . CARDIAC CATHETERIZATION     more than 5 years ago  . CARDIAC CATHETERIZATION  06-07-2012   [facility]: Normal coronary arteries. No significant LVOT/aortic valve gradient  . CHOLECYSTECTOMY    . COLONOSCOPY WITH PROPOFOL N/A 02/27/2014   Procedure: COLONOSCOPY WITH PROPOFOL;  Surgeon: Charolett Bumpers, MD;  Location: [facility];  Service: Endoscopy;  Laterality: N/A;  . ESOPHAGOGASTRODUODENOSCOPY (EGD) WITH PROPOFOL N/A 02/27/2014   Procedure: ESOPHAGOGASTRODUODENOSCOPY (EGD) WITH PROPOFOL;  Surgeon: Charolett Bumpers, MD;  Location: [facility];  Service: Endoscopy;  Laterality: N/A;  . EXPLORATORY LAPAROTOMY  09/2001   ELAP, LOA, R partial oophorectomy, repair of bowel injury  . VAGINAL HYSTERECTOMY  2001   Adnexa left in place    There were no vitals filed for this visit.   Subjective Assessment - 11/14/17 1025    Subjective  pt is a 52 y.o F with CC of low back pain with  referral down the LLE to the foot that is mostly pain and occasional tingling, that started 2-3 weeks with no specific MOI. Since starting the pain and referral pain seems to be increasing. pt denies any red flags. pt reports no previous hx or low back or LE pain.     Limitations  Sitting;Standing;Walking;House hold activities    How long can you sit comfortably?  5 min    How long can you stand comfortably?  5 min    How long can you walk comfortably?   5 min    Diagnostic tests  11/08/2017 MRI     Patient Stated Goals  get the pain to feel better, return to walking    Currently in Pain?  Yes    Pain Score  9  at worst 10/10    Pain Location  Back    Pain Orientation  Left    Pain Descriptors / Indicators  Sharp;Nagging    Pain Radiating Towards  referral to the L foot    Aggravating Factors   sitting    Pain Relieving Factors  ice, heat    Effect of Pain on Daily Activities  limited  sitting/ standing positions.          [facility] PT Assessment - 11/14/17 1031      Assessment  Medical Diagnosis  Low back pain with sciatica    Referring Provider  Hoy RegisterNewlin, Enobong, MD    Onset Date/Surgical Date  -- 2-3 weeks    Hand Dominance  Left    Next MD Visit  3 months    Prior Therapy  no      Precautions   Precautions  None      Restrictions   Weight Bearing Restrictions  No      Balance Screen   Has the patient fallen in the past 6 months  No    Has the patient had a decrease in activity level because of a fear of falling?   No    Is the patient reluctant to leave their home because of a fear of falling?   No      Home Environment   Living Environment  Private residence    Living Arrangements  Other relatives    Available Help at Discharge  Family;Available PRN/intermittently    Type of Home  House    Home Access  Stairs to enter    Entrance Stairs-Number of Steps  4    Entrance Stairs-Rails  Right    Home Layout  Two level    Alternate Level Stairs-Number of Steps  10     Alternate Level Stairs-Rails  Right      Prior Function   Level of Independence  Independent;Independent with basic ADLs    Vocation  On disability    Leisure  bowling, skating      Cognition   Overall Cognitive Status  Within Functional Limits for tasks assessed      Posture/Postural Control   Posture/Postural Control  Postural limitations    Postural Limitations  Rounded Shoulders;Forward head;Weight shift left;Flexed trunk    Posture Comments  difficulty standing after sitting noted mulitple times during session utilizing limited weight through LLE      ROM / Strength   AROM / PROM / Strength  AROM;PROM;Strength      AROM   AROM Assessment Site  Hip;Lumbar    Lumbar Flexion  50 pain returning to standing    Lumbar Extension  15    Lumbar - Right Side Bend  15    Lumbar - Left Side Bend  18      Strength   Strength Assessment Site  Hip;Knee    Right/Left Hip  Right;Left    Right Hip Flexion  3+/5    Right Hip Extension  3/5    Right Hip ABduction  3+/5    Left Hip Flexion  3+/5    Left Hip Extension  3/5    Left Hip ABduction  3+/5    Right/Left Knee  Right;Left    Right Knee Flexion  4-/5    Right Knee Extension  4-/5    Left Knee Flexion  4-/5    Left Knee Extension  4-/5      Palpation   Spinal mobility  hypomobility in t10- L5 and pain noted with PAIVM    Palpation comment  TTP along bil lumbar paraspinals and glute med/ piriformis      Ambulation/Gait   Ambulation/Gait  Yes    Gait Pattern  Decreased stride length;Decreased stance time - right;Decreased step length - left;Trendelenburg;Antalgic;Lateral hip instability;Trunk flexed;Decreased trunk rotation weight shift to the RLE             Objective measurements completed on examination: See above findings.  PT Education - 11/14/17 1101    Education provided  Yes    Education Details  evaluation findings, POC, goals, HEP with form/ rationale, anatomy of disc and biomechanics.      Person(s) Educated  Patient    Methods  Explanation;Demonstration;Verbal cues;Handout    Comprehension  Verbalized understanding;Returned demonstration;Verbal cues required       PT Short Term Goals - 11/14/17 1116      PT SHORT TERM GOAL #1   Title  pt to be I with initial HEP    Baseline  no previous provided HEP    Time  3    Period  Weeks    Status  New    Target Date  12/05/17      PT SHORT TERM GOAL #2   Title  demonstrate proper posture in multiple positions and lifting mechancis to prevent/ reduce low back pain and referral symptoms    Baseline  no previous knowledge of posture    Time  3    Period  Weeks    Status  New    Target Date  12/05/17      PT SHORT TERM GOAL #3   Title  pt will reported >/= 4 days with no referred pain down the LLE to indicate improveing condition    Baseline  constant pain down the LLE    Time  3    Period  Weeks    Status  New    Target Date  12/05/17        PT Long Term Goals - 11/14/17 1134      PT LONG TERM GOAL #1   Title  pt to increase trunk mobility by >/= 70 degrees flexion and >/= 20 degrees with ext/ bil side bending to promote functional trunk mobility for ADLs with </= 2/10 pain    Baseline  50 flexion, extension 15, R sidebending 15, L sidebending 18 with 9/10 pain    Time  6    Period  Weeks    Status  New    Target Date  12/26/17      PT LONG TERM GOAL #2   Title  improve bil hip/ knee strength to >/= 4/5 in all planes to promote stability and facilitate form / posture and lifting activities    Baseline  bil hip flexion 3+/5, bil abd 3+/5, extension 3/5 bil     Time  6    Period  Weeks    Status  New    Target Date  12/26/17      PT LONG TERM GOAL #3   Title  pt to be able to sit/ stand/ walk for >/= 30 min with </= 2/10 pain for functoinal endurance required for community amb and ADLs    Baseline  sit/ stand and walk 5 min with 9/10 pain     Time  6    Period  Weeks    Status  New    Target Date   12/26/17      PT LONG TERM GOAL #4   Title  pt to be I with all HEP given as of last visit to maintain/ progress current level of function    Baseline  no previous HEP    Time  6    Period  Weeks    Status  New    Target Date  12/26/17             Plan - 11/14/17 1058  Clinical Impression Statement  pt presents to OPPT with CC of low back pain with LLE referral pain that started 2-3 weeks ago with no specific onset. She demonstrated limited trunk mobility and weakness in bil hips secondary to pain inthe back and LLE. TTP along bil lumbar paraspinals and glute med/ piriformis, with hypermobility of T10-L5 with pain. pt demonstrates extension bias and reported centralizeation with repeated extension. She would benefit from physical therapy to promote proper posture/ lifting mechanics, reduce low back pain, and muscle spasm, increase hip strength and maximize her function by addressing the deficits listed.     History and Personal Factors relevant to plan of care:  PMhx of arthritis, HTN, asthma and syncopy    Clinical Presentation  Evolving    Clinical Presentation due to:  recent onset of pain, limited trunk mobility. weakness in bil hips, referral down the LLE    Clinical Decision Making  Moderate    Rehab Potential  Good    PT Frequency  1x / week    PT Duration  3 weeks progressing to 2 x week for 6 weeks    PT Treatment/Interventions  ADLs/Self Care Home Management;Cryotherapy;Electrical Stimulation;Iontophoresis 4mg /ml Dexamethasone;Moist Heat;Traction;Ultrasound;Balance training;Stair training;Therapeutic activities;Therapeutic exercise;Manual techniques;Taping;Dry needling;Patient/family education    PT Next Visit Plan  review/ update HEP, extension bias progression as tolerated, core strengthening, manual for low back and piriformis, modalities PRN    PT Home Exercise Plan  prone on elbows, piriformis stretching, posterior pelvic tilt, lower trunk rotation.     Consulted and  Agree with Plan of Care  Patient       Patient will benefit from skilled therapeutic intervention in order to improve the following deficits and impairments:  Pain, Abnormal gait, Decreased activity tolerance, Postural dysfunction, Improper body mechanics, Decreased range of motion, Decreased strength, Increased muscle spasms, Difficulty walking, Decreased endurance  Visit Diagnosis: Chronic bilateral low back pain with left-sided sciatica  Muscle weakness (generalized)  Abnormal posture  Other abnormalities of gait and mobility  Muscle spasm of back     Problem List Patient Active Problem List   Diagnosis Date Noted  . Low back pain 10/30/2017  . Itching with irritation 06/23/2016  . Gastric reflux 05/04/2016  . Asthma 05/04/2016  . Essential hypertension 07/23/2015  . Mild depression (HCC) 07/23/2015  . Chest pain 06/06/2012  . Murmur, cardiac 06/06/2012   Lulu Riding PT, DPT, LAT, ATC  11/14/17  11:42 AM       [facility] [address] [place], [place], [address] Phone: [phone]   Fax:  [phone]  Name: [name] MRN: [id number] Date of Birth: 28-Aug-1966

## 2017-11-27 ENCOUNTER — Ambulatory Visit (INDEPENDENT_AMBULATORY_CARE_PROVIDER_SITE_OTHER): Payer: Medicaid Other | Admitting: Orthopaedic Surgery

## 2017-11-27 ENCOUNTER — Encounter (INDEPENDENT_AMBULATORY_CARE_PROVIDER_SITE_OTHER): Payer: Self-pay | Admitting: Orthopaedic Surgery

## 2017-11-27 DIAGNOSIS — M5442 Lumbago with sciatica, left side: Secondary | ICD-10-CM | POA: Diagnosis not present

## 2017-11-27 MED ORDER — TRAMADOL HCL 50 MG PO TABS: 100.0000 mg | ORAL_TABLET | Freq: Four times a day (QID) | ORAL | 0 refills | Status: DC | PRN

## 2017-11-27 NOTE — Progress Notes (Signed)
Office Visit Note   Patient: [name]           Date of Birth: Jul 20, 1966           MRN: [id number] Visit Date: 11/27/2017              Requested by: Hoy Register, MD [address] [place], [place] [address] PCP: Hoy Register, MD   Assessment & Plan: Visit Diagnoses:  1. Acute left-sided low back pain with left-sided sciatica     Plan: She is already in physical therapy so I do feel a combination of the therapy with a guided injection to the left at L4-L5 by Dr. Alvester Morin under direct fluoroscopy is warranted at this point.  She agrees with trying this as well.  All questions concerns were answered and addressed.  We will also try some tramadol for pain.  Follow-Up Instructions: Return in about 3 weeks (around 12/18/2017).   Orders:  No orders of the defined types were placed in this encounter.  Meds ordered this encounter  Medications  . traMADol (ULTRAM) 50 MG tablet    Sig: Take 2 tablets (100 mg total) by mouth every 6 (six) hours as needed.    Dispense:  60 tablet    Refill:  0      Procedures: No procedures performed   Clinical Data: No additional findings.   Subjective: Chief Complaint  Patient presents with  . Lower Back - Pain  The patient is sent from her primary care physicians to evaluate low back pain with left-sided radicular symptoms.  She did have an MRI done recently that she was told she has a "pinched nerve in her back".  She is on Flexeril and naproxen.  She says it hurts quite a bit going down her right leg.  She denies any left leg symptoms.  She denies being a diabetic.  She denies any change in bowel bladder function.  She says this is all been going on for just over a month.  She is a moderately obese individual.  She weighs about 240 pounds.  She does report that she has been going to physical therapy but this has not helped as much as she would like.  HPI  Review of Systems She currently denies any headache, chest pain,  shortness of breath, fever, chills, nausea, vomiting  Objective: Vital Signs: There were no vitals taken for this visit.  Physical Exam She is alert and oriented x3 and in no acute distress Ortho Exam Examination of her bilateral lower extremity shows a positive straight leg raise to the left side.  She has subjective numbness in the lateral aspect of her left leg but no weakness. Specialty Comments:  No specialty comments available.  Imaging: No results found. The MRI is reviewed with her and I went over all the films with her.  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  She does have some mild facet disease at the septum may be contributing to foraminal stenosis combined with the disc protrusion.  PMFS History: Patient Active Problem List   Diagnosis Date Noted  . Acute left-sided low back pain with left-sided sciatica 11/27/2017  . Low back pain 10/30/2017  . Itching with irritation 06/23/2016  . Gastric reflux 05/04/2016  . Asthma 05/04/2016  . Essential hypertension 07/23/2015  . Mild depression (HCC) 07/23/2015  . Chest pain 06/06/2012  . Murmur, cardiac 06/06/2012  Past Medical History:  Diagnosis Date  . Asthma   . Gastric reflux   . History of blood transfusion yrs agio, none recent  . Hypertension   . Osteoarthritis   . Sleep apnea    cpap machine has not used since 2014 due to machine/tubing in bad shape   . Syncope and collapse yrs ago    History reviewed. No pertinent family history.  Past Surgical History:  Procedure Laterality Date  . CARDIAC CATHETERIZATION     more than 5 years ago  . CARDIAC CATHETERIZATION  06-07-2012   [facility]: Normal coronary arteries. No significant LVOT/aortic valve gradient  . CHOLECYSTECTOMY    . COLONOSCOPY WITH PROPOFOL N/A 02/27/2014   Procedure: COLONOSCOPY WITH PROPOFOL;  Surgeon: Charolett BumpersMartin K Johnson, MD;  Location: [facility];  Service: Endoscopy;   Laterality: N/A;  . ESOPHAGOGASTRODUODENOSCOPY (EGD) WITH PROPOFOL N/A 02/27/2014   Procedure: ESOPHAGOGASTRODUODENOSCOPY (EGD) WITH PROPOFOL;  Surgeon: Charolett BumpersMartin K Johnson, MD;  Location: [facility];  Service: Endoscopy;  Laterality: N/A;  . EXPLORATORY LAPAROTOMY  09/2001   ELAP, LOA, R partial oophorectomy, repair of bowel injury  . VAGINAL HYSTERECTOMY  2001   Adnexa left in place   Social History   Occupational History  . Not on file  Tobacco Use  . Smoking status: Never Smoker  . Smokeless tobacco: Never Used  Substance and Sexual Activity  . Alcohol use: No  . Drug use: No  . Sexual activity: No    Birth control/protection: Surgical

## 2017-11-28 ENCOUNTER — Encounter: Payer: Self-pay | Admitting: Physical Therapy

## 2017-11-28 ENCOUNTER — Ambulatory Visit: Payer: Medicaid Other | Admitting: Physical Therapy

## 2017-11-28 ENCOUNTER — Other Ambulatory Visit (INDEPENDENT_AMBULATORY_CARE_PROVIDER_SITE_OTHER): Payer: Self-pay

## 2017-11-28 DIAGNOSIS — M5442 Lumbago with sciatica, left side: Secondary | ICD-10-CM

## 2017-11-28 DIAGNOSIS — R293 Abnormal posture: Secondary | ICD-10-CM

## 2017-11-28 DIAGNOSIS — G8929 Other chronic pain: Secondary | ICD-10-CM

## 2017-11-28 DIAGNOSIS — R2689 Other abnormalities of gait and mobility: Secondary | ICD-10-CM

## 2017-11-28 DIAGNOSIS — M62830 Muscle spasm of back: Secondary | ICD-10-CM

## 2017-11-28 DIAGNOSIS — M6281 Muscle weakness (generalized): Secondary | ICD-10-CM

## 2017-11-28 NOTE — Therapy (Signed)
[facility] [address] [place], [place], [address] Phone: [phone]   Fax:  [phone]  Physical Therapy Treatment  Patient Details  Name: Jenna LernerBetsy [name] MRN: [id number] Date of Birth: 05/22/1966 Referring Provider: Hoy RegisterNewlin, Enobong, MD   Encounter Date: 11/28/2017  PT End of Session - 11/28/17 1030    Visit Number  2    Number of Visits  13    Date for PT Re-Evaluation  12/26/17    PT Start Time  1015    PT Stop Time  1104    PT Time Calculation (min)  49 min    Activity Tolerance  Patient tolerated treatment well    Behavior During Therapy  [facility] for tasks assessed/performed       Past Medical History:  Diagnosis Date  . Asthma   . Gastric reflux   . History of blood transfusion yrs agio, none recent  . Hypertension   . Osteoarthritis   . Sleep apnea    cpap machine has not used since 2014 due to machine/tubing in bad shape   . Syncope and collapse yrs ago    Past Surgical History:  Procedure Laterality Date  . CARDIAC CATHETERIZATION     more than 5 years ago  . CARDIAC CATHETERIZATION  06-07-2012   [facility]: Normal coronary arteries. No significant LVOT/aortic valve gradient  . CHOLECYSTECTOMY    . COLONOSCOPY WITH PROPOFOL N/A 02/27/2014   Procedure: COLONOSCOPY WITH PROPOFOL;  Surgeon: Charolett BumpersMartin K Johnson, MD;  [facility];  Service: Endoscopy;  Laterality: N/A;  . ESOPHAGOGASTRODUODENOSCOPY (EGD) WITH PROPOFOL N/A 02/27/2014   Procedure: ESOPHAGOGASTRODUODENOSCOPY (EGD) WITH PROPOFOL;  Surgeon: Charolett BumpersMartin K Johnson, MD;  [facility];  Service: Endoscopy;  Laterality: N/A;  . EXPLORATORY LAPAROTOMY  09/2001   ELAP, LOA, R partial oophorectomy, repair of bowel injury  . VAGINAL HYSTERECTOMY  2001   Adnexa left in place    There were no vitals filed for this visit.  Subjective Assessment - 11/28/17 1021    Subjective  " I did see an orthopedic and they plan to give me shots in my back, the pain  is at a 9/10. I noticed that I am starting to get cramping in my L leg and my r leg is getting tired/ sore.     Currently in Pain?  Yes    Pain Score  9     Pain Location  Back    Pain Orientation  Left    Pain Radiating Towards  referral to the L foot     Pain Frequency  Intermittent    Aggravating Factors   sitting for long periods of time    Pain Relieving Factors  ice, heat                      [facility] Adult PT Treatment/Exercise - 11/28/17 1025      Self-Care   Self-Care  Posture    Posture  proper posture and lifting mechanics handout provided      Lumbar Exercises: Stretches   Lower Trunk Rotation  -- 2 x 10     Lower Trunk Rotation Limitations  verbal cues to stay within pain free rOM    Prone on Elbows Stretch  20 seconds 2 x 10     Press Ups  20 reps holding 2 sec      Lumbar Exercises: Supine   Ab Set  10 reps;5 seconds    Glut Set  10  reps;3 seconds x 2      Modalities   Modalities  Moist Heat      Moist Heat Therapy   Number Minutes Moist Heat  10 Minutes    Moist Heat Location  Lumbar Spine prone                PT Short Term Goals - 11/14/17 1116      PT SHORT TERM GOAL #1   Title  pt to be I with initial HEP    Baseline  no previous provided HEP    Time  3    Period  Weeks    Status  New    Target Date  12/05/17      PT SHORT TERM GOAL #2   Title  demonstrate proper posture in multiple positions and lifting mechancis to prevent/ reduce low back pain and referral symptoms    Baseline  no previous knowledge of posture    Time  3    Period  Weeks    Status  New    Target Date  12/05/17      PT SHORT TERM GOAL #3   Title  pt will reported >/= 4 days with no referred pain down the LLE to indicate improveing condition    Baseline  constant pain down the LLE    Time  3    Period  Weeks    Status  New    Target Date  12/05/17        PT Long Term Goals - 11/14/17 1134      PT LONG TERM GOAL #1   Title  pt to increase trunk  mobility by >/= 70 degrees flexion and >/= 20 degrees with ext/ bil side bending to promote functional trunk mobility for ADLs with </= 2/10 pain    Baseline  50 flexion, extension 15, R sidebending 15, L sidebending 18 with 9/10 pain    Time  6    Period  Weeks    Status  New    Target Date  12/26/17      PT LONG TERM GOAL #2   Title  improve bil hip/ knee strength to >/= 4/5 in all planes to promote stability and facilitate form / posture and lifting activities    Baseline  bil hip flexion 3+/5, bil abd 3+/5, extension 3/5 bil     Time  6    Period  Weeks    Status  New    Target Date  12/26/17      PT LONG TERM GOAL #3   Title  pt to be able to sit/ stand/ walk for >/= 30 min with </= 2/10 pain for functoinal endurance required for community amb and ADLs    Baseline  sit/ stand and walk 5 min with 9/10 pain     Time  6    Period  Weeks    Status  New    Target Date  12/26/17      PT LONG TERM GOAL #4   Title  pt to be I with all HEP given as of last visit to maintain/ progress current level of function    Baseline  no previous HEP    Time  6    Period  Weeks    Status  New    Target Date  12/26/17            Plan - 11/28/17 1057    Clinical Impression Statement  pt  reported pain increased to 9/10 today, and states she saw an orthopedic and wants to get injections to relieve the back pain. continued with extension biased progressiong to prone press-up which continues to centralize pain. ecuated about posture and benefits of posture. continued core/ glute activation. post session she reported pain dropped to 3-4/10 with MHP in prone.     PT Next Visit Plan  review/ update HEP, extension bias progression as tolerated, core strengthening, manual for low back and piriformis, modalities PRN    PT Home Exercise Plan  prone on elbows, piriformis stretching, posterior pelvic tilt, lower trunk rotation. posture handout, prone press-up    Consulted and Agree with Plan of Care   Patient       Patient will benefit from skilled therapeutic intervention in order to improve the following deficits and impairments:  Pain, Abnormal gait, Decreased activity tolerance, Postural dysfunction, Improper body mechanics, Decreased range of motion, Decreased strength, Increased muscle spasms, Difficulty walking, Decreased endurance  Visit Diagnosis: Chronic bilateral low back pain with left-sided sciatica  Muscle weakness (generalized)  Abnormal posture  Other abnormalities of gait and mobility  Muscle spasm of back     Problem List Patient Active Problem List   Diagnosis Date Noted  . Acute left-sided low back pain with left-sided sciatica 11/27/2017  . Low back pain 10/30/2017  . Itching with irritation 06/23/2016  . Gastric reflux 05/04/2016  . Asthma 05/04/2016  . Essential hypertension 07/23/2015  . Mild depression (HCC) 07/23/2015  . Chest pain 06/06/2012  . Murmur, cardiac 06/06/2012   Lulu Riding PT, DPT, LAT, ATC  11/28/17  10:59 AM      [facility] [address] [place], [place], [address] Phone: [phone]   Fax:  [phone]  Name: [name] MRN: [id number] Date of Birth: 08-Jul-1966

## 2017-11-28 NOTE — Patient Instructions (Addendum)

## 2017-11-29 ENCOUNTER — Telehealth (INDEPENDENT_AMBULATORY_CARE_PROVIDER_SITE_OTHER): Payer: Self-pay | Admitting: *Deleted

## 2017-11-29 ENCOUNTER — Other Ambulatory Visit (INDEPENDENT_AMBULATORY_CARE_PROVIDER_SITE_OTHER): Payer: Self-pay | Admitting: Physical Medicine and Rehabilitation

## 2017-11-29 MED ORDER — DIAZEPAM 5 MG PO TABS: ORAL_TABLET | ORAL | 0 refills | Status: DC

## 2017-11-29 NOTE — Progress Notes (Signed)
Valium pre-procedure 

## 2017-11-29 NOTE — Telephone Encounter (Signed)
Printed rx  

## 2017-12-01 ENCOUNTER — Telehealth: Payer: Self-pay | Admitting: Family Medicine

## 2017-12-01 DIAGNOSIS — M5442 Lumbago with sciatica, left side: Principal | ICD-10-CM

## 2017-12-01 DIAGNOSIS — G8929 Other chronic pain: Secondary | ICD-10-CM

## 2017-12-01 NOTE — Telephone Encounter (Signed)
Received [employer] documents. Faxed both Part A and Part B of application to [employer] [phone]. Second time we fax complete application.

## 2017-12-01 NOTE — Telephone Encounter (Signed)
Pt came to the office to drop up the paper for [employer], paper will be given to Erskine SquibbJane on her desk,

## 2017-12-01 NOTE — Telephone Encounter (Signed)
Pt came to the office to request a refill for acetaminophen-codeine (TYLENOL #3) 300-30 MG tablet  [phone] - [place], [place] - [address] please follow up

## 2017-12-04 MED ORDER — ACETAMINOPHEN-CODEINE #3 300-30 MG PO TABS: 1.0000 | ORAL_TABLET | Freq: Two times a day (BID) | ORAL | 1 refills | Status: DC | PRN

## 2017-12-04 NOTE — Telephone Encounter (Signed)
Ready for pick up

## 2017-12-05 ENCOUNTER — Ambulatory Visit: Payer: Medicaid Other | Admitting: Physical Therapy

## 2017-12-05 ENCOUNTER — Telehealth: Payer: Self-pay | Admitting: Physical Therapy

## 2017-12-05 NOTE — Telephone Encounter (Signed)
Done

## 2017-12-05 NOTE — Telephone Encounter (Signed)
No show. Called and left voicemail encouraging pt to call back. Office number was provided.   12:11 PM,12/05/17 Donita BrooksSara Leanord Thibeau PT, DPT [facility]  [phone]

## 2017-12-08 ENCOUNTER — Emergency Department (HOSPITAL_COMMUNITY)
Admission: EM | Admit: 2017-12-08 | Discharge: 2017-12-08 | Disposition: A | Discharge status: Home / Self Care | Payer: Medicaid Other | Attending: Emergency Medicine | Admitting: Emergency Medicine

## 2017-12-08 ENCOUNTER — Telehealth (INDEPENDENT_AMBULATORY_CARE_PROVIDER_SITE_OTHER): Payer: Self-pay | Admitting: Orthopaedic Surgery

## 2017-12-08 ENCOUNTER — Encounter (HOSPITAL_COMMUNITY): Payer: Self-pay

## 2017-12-08 ENCOUNTER — Other Ambulatory Visit: Payer: Self-pay

## 2017-12-08 DIAGNOSIS — I10 Essential (primary) hypertension: Secondary | ICD-10-CM | POA: Diagnosis not present

## 2017-12-08 DIAGNOSIS — R21 Rash and other nonspecific skin eruption: Secondary | ICD-10-CM | POA: Insufficient documentation

## 2017-12-08 DIAGNOSIS — Z79899 Other long term (current) drug therapy: Secondary | ICD-10-CM | POA: Insufficient documentation

## 2017-12-08 MED ORDER — HYDROCORTISONE 1 % EX CREA: TOPICAL_CREAM | CUTANEOUS | 0 refills | Status: DC

## 2017-12-08 MED ORDER — LORATADINE 10 MG PO TABS
10.0000 mg | ORAL_TABLET | Freq: Once | ORAL | Status: AC
  Administered 2017-12-08: 10 mg via ORAL
  Filled 2017-12-08: qty 1

## 2017-12-08 MED ORDER — PREDNISONE 10 MG PO TABS: 20.0000 mg | ORAL_TABLET | Freq: Every day | ORAL | 0 refills | Status: AC

## 2017-12-08 NOTE — ED Triage Notes (Signed)
Pt is alert and oriented x 4 and is verbally responsive. Pt reports a rash to her rt arm and chest. Small red bums are noted to left lower arm and rt side of chest. Pt reports that they are itchy and began today. Pt also reports that she feels hoarse.

## 2017-12-08 NOTE — ED Notes (Signed)
Bed: WTR9 Expected date:  Expected time:  Means of arrival:  Comments: 

## 2017-12-08 NOTE — Telephone Encounter (Signed)
Patient call stating that she had a reaction to the Tramadol.  Would like something else called in for her.  CB#[phone].  Thank you.

## 2017-12-08 NOTE — Telephone Encounter (Signed)
FYI----patient called again to triage and they told her to call 911 since she was having trouble swallowing, red swelling blisters, and hoarseness

## 2017-12-08 NOTE — ED Triage Notes (Signed)
Per EMS- Patient c/o rash on her left forearm that she noticed this AM. Patient states she had a change in her tramadol prescription recently. No SOB. No N/V.

## 2017-12-08 NOTE — ED Provider Notes (Signed)
[facility]-EMERGENCY DEPT Provider Note   CSN: [id number] Arrival date & time: 12/08/17  1129     History   Chief Complaint Chief Complaint  Patient presents with  . Rash    HPI [name] is a 52 y.o. female.  HPI   Patient is a 52 year old female with a history of asthma who presents the ED today complaining of a rash that is itchy and is located on her bilateral forearms, upper chest, and right upper back.  States she noted the rash this morning when she woke up around 7am.  She also reports a scratchy/sore throat that began this morning, but states she has been able to tolerate fluids and solids with no difficulty.  Denies a feeling of throat swelling.  Denies any lip swelling, tongue swelling.  She reports chronic wheezing secondary to her asthma, which she states is unchanged today.  She denies any shortness of breath or chest pain.  Denies abdominal pain, nausea, vomiting, diarrhea.  Denies history of anaphylaxis.  States that she started a new medication yesterday.  States that she took tramadol for the first time last night at 10:00.  She then went to bed.  Denies any new soaps, detergents, or foods.  Past Medical History:  Diagnosis Date  . Asthma   . Gastric reflux   . History of blood transfusion yrs agio, none recent  . Hypertension   . Osteoarthritis   . Sleep apnea    cpap machine has not used since 2014 due to machine/tubing in bad shape   . Syncope and collapse yrs ago    Patient Active Problem List   Diagnosis Date Noted  . Acute left-sided low back pain with left-sided sciatica 11/27/2017  . Low back pain 10/30/2017  . Itching with irritation 06/23/2016  . Gastric reflux 05/04/2016  . Asthma 05/04/2016  . Essential hypertension 07/23/2015  . Mild depression (HCC) 07/23/2015  . Chest pain 06/06/2012  . Murmur, cardiac 06/06/2012    Past Surgical History:  Procedure Laterality Date  . CARDIAC CATHETERIZATION     more than 5  years ago  . CARDIAC CATHETERIZATION  06-07-2012   [facility]: Normal coronary arteries. No significant LVOT/aortic valve gradient  . CHOLECYSTECTOMY    . COLONOSCOPY WITH PROPOFOL N/A 02/27/2014   Procedure: COLONOSCOPY WITH PROPOFOL;  Surgeon: Charolett BumpersMartin K Johnson, MD;  Location: [facility];  Service: Endoscopy;  Laterality: N/A;  . ESOPHAGOGASTRODUODENOSCOPY (EGD) WITH PROPOFOL N/A 02/27/2014   Procedure: ESOPHAGOGASTRODUODENOSCOPY (EGD) WITH PROPOFOL;  Surgeon: Charolett BumpersMartin K Johnson, MD;  Location: [facility];  Service: Endoscopy;  Laterality: N/A;  . EXPLORATORY LAPAROTOMY  09/2001   ELAP, LOA, R partial oophorectomy, repair of bowel injury  . VAGINAL HYSTERECTOMY  2001   Adnexa left in place    OB History    Gravida Para Term Preterm AB Living   2 2 2     2    SAB TAB Ectopic Multiple Live Births                   Home Medications    Prior to Admission medications   Medication Sig Start Date End Date Taking? Authorizing Provider  acetaminophen-codeine (TYLENOL #3) 300-30 MG tablet Take 1 tablet by mouth every 12 (twelve) hours as needed for moderate pain. 12/04/17   Hoy RegisterNewlin, Enobong, MD  albuterol (PROVENTIL HFA;VENTOLIN HFA) 108 (90 Base) MCG/ACT inhaler Inhale 2 puffs into the lungs 2 (two) times daily. 08/29/17   Hoy RegisterNewlin, Enobong, MD  benzonatate (  TESSALON) 100 MG capsule Take 1 capsule (100 mg total) by mouth 3 (three) times daily as needed for cough. 10/16/17   Sudie Grumbling, NP  budesonide-formoterol (SYMBICORT) 160-4.5 MCG/ACT inhaler Inhale 2 puffs into the lungs 2 (two) times daily. 08/29/17   Hoy Register, MD  cetirizine (ZYRTEC) 10 MG tablet Take 1 tablet (10 mg total) by mouth daily. 08/29/17   Hoy Register, MD  cyclobenzaprine (FLEXERIL) 10 MG tablet Take 1 tablet (10 mg total) by mouth 2 (two) times daily as needed for muscle spasms. 04/25/17   Hoy Register, MD  diazepam (VALIUM) 5 MG tablet Take 1 by mouth 1 to 2 hours pre-procedure. May repeat if necessary. 11/29/17    Tyrell Antonio, MD  hydrocortisone cream 1 % Apply to affected area 2 times daily 12/08/17   Shallen Luedke S, PA-C  metoprolol succinate (TOPROL-XL) 25 MG 24 hr tablet Take 0.5 tablets (12.5 mg total) by mouth daily. 08/29/17   Hoy Register, MD  naproxen (NAPROSYN) 500 MG tablet Take 1 tablet (500 mg total) by mouth 2 (two) times daily with a meal. 08/29/17   Hoy Register, MD  omeprazole (PRILOSEC) 20 MG capsule Take 1 capsule (20 mg total) by mouth daily. Patient taking differently: Take 40 mg by mouth daily.  08/29/17 09/28/17  Hoy Register, MD  traMADol (ULTRAM) 50 MG tablet Take 2 tablets (100 mg total) by mouth every 6 (six) hours as needed. 11/27/17   Kathryne Hitch, MD    Family History History reviewed. No pertinent family history.  Social History Social History   Tobacco Use  . Smoking status: Never Smoker  . Smokeless tobacco: Never Used  Substance Use Topics  . Alcohol use: No  . Drug use: No     Allergies   No known allergies   Review of Systems Review of Systems  Constitutional: Negative for fever.  HENT:       Scratchy throat, no feeling of throat swelling, no lip/tongue swelling  Respiratory: Positive for wheezing (chronic, unchanged). Negative for cough and shortness of breath.   Cardiovascular: Negative for chest pain.  Gastrointestinal: Negative for abdominal pain, constipation, diarrhea, nausea and vomiting.  Musculoskeletal: Negative for neck pain.  Skin: Positive for rash.     Physical Exam Updated Vital Signs BP (!) 145/65   Pulse 88   Temp 98.4 F (36.9 C) (Oral)   Resp 15   Ht 5\' 4"  (1.626 m)   Wt 99.8 kg (220 lb)   SpO2 99%   BMI 37.76 kg/m   Physical Exam  Constitutional: She appears well-developed and well-nourished. No distress.  Patient is nontoxic-appearing and in no acute distress.  She is speaking in paragraphs and is able to tolerate p.o.  HENT:  Head: Normocephalic and atraumatic.  No pharyngeal erythema.   Patent airway.  No pharyngeal erythema.  Uvula midline.  No tonsillar exudates or swelling.  No lip swelling, no tongue swelling.  No evidence of angioedema.  Eyes: Conjunctivae and EOM are normal. Pupils are equal, round, and reactive to light.  Neck: Normal range of motion. Neck supple.  Cardiovascular: Normal rate, regular rhythm and normal heart sounds.  Pulmonary/Chest: Effort normal and breath sounds normal. No stridor. No respiratory distress. She has no wheezes.  No tachypnea  Abdominal: Soft. There is no tenderness.  Musculoskeletal: Normal range of motion.  Neurological: She is alert.  Skin: Skin is warm and dry. Capillary refill takes less than 2 seconds.  Patient has small areas of erythema  on the bilateral forearms, that are not raised.  Not consistent with hives.  No drainage or warmth to the areas.  There is a small area of erythema to the right upper back that is also not raised.  There are 3 c cherry angiomas to the right upper chest.  Small area of erythema to the mid upper chest, that patient has been scratching.  There are no areas of raised skin.  Psychiatric: She has a normal mood and affect.  Nursing note and vitals reviewed.          ED Treatments / Results  Labs (all labs ordered are listed, but only abnormal results are displayed) Labs Reviewed - No data to display  EKG  EKG Interpretation None       Radiology No results found.  Procedures Procedures (including critical care time)  Medications Ordered in ED Medications  loratadine (CLARITIN) tablet 10 mg (not administered)     Initial Impression / Assessment and Plan / ED Course  I have reviewed the triage vital signs and the nursing notes.  Pertinent labs & imaging results that were available during my care of the patient were reviewed by me and considered in my medical decision making (see chart for details).   2:30 PM patient up walking around the department in no acute  distress.  Discussed pt presentation and exam findings with Dr. Rubin Payor, who agrees with the plan to d/c pt with steroids and have f/u as outpatient.   Final Clinical Impressions(s) / ED Diagnoses   Final diagnoses:  Rash   Rash consistent with allergic reaction. Patient denies any difficulty breathing. Does report a sore throat, but has had no difficulty swallowing and has tolerated PO in the ED. Pt has a patent airway without stridor and is handling secretions without difficulty; no angioedema. She has not wheezing on her pulmonary exam and is satting at 99% on RA. No tachypnea or evidence of respiratory distress. No blisters, no pustules, no warmth, no draining sinus tracts, no superficial abscesses, no bullous impetigo, no vesicles, no desquamation, no target lesions with dusky purpura or a central bulla. Not tender to touch. No concern for superimposed infection. No concern for SJS, TEN, TSS, tick borne illness, syphilis or other life-threatening condition. Advised patient to stop taking tramadol. Will discharge home with hydrocortisone cream and short course of steroids.  Advised to follow-up with her primary care doctor within 3-7 days for reevaluation.  Gave strict return precautions for any new or worsening symptoms.  All questions were answered and patient understand the plan.   ED Discharge Orders        Ordered    hydrocortisone cream 1 %     12/08/17 1354       Khloi Rawl S, PA-C 12/08/17 1525    Benjiman Core, MD 12/08/17 [phone]

## 2017-12-08 NOTE — Discharge Instructions (Signed)
You need to stop using tramadol that you were prescribed. Please use hydrocortisone cream to the affected areas 2 times a day for the next week.  Please take prednisone daily for the next 4 days. Please follow-up with your primary care doctor to check resolution of your symptoms within 1 weeks  Please return to the emergency department if you have any difficulty swallowing, problems breathing, worsening wheezing, shortness of breath, vomiting, diarrhea, or any new or worsening symptoms.

## 2017-12-11 ENCOUNTER — Ambulatory Visit: Payer: Medicaid Other | Attending: Family Medicine | Admitting: Physical Therapy

## 2017-12-11 ENCOUNTER — Encounter: Payer: Self-pay | Admitting: Physical Therapy

## 2017-12-11 DIAGNOSIS — M6281 Muscle weakness (generalized): Secondary | ICD-10-CM | POA: Insufficient documentation

## 2017-12-11 DIAGNOSIS — M62830 Muscle spasm of back: Secondary | ICD-10-CM | POA: Insufficient documentation

## 2017-12-11 DIAGNOSIS — R293 Abnormal posture: Secondary | ICD-10-CM

## 2017-12-11 DIAGNOSIS — M5442 Lumbago with sciatica, left side: Secondary | ICD-10-CM | POA: Diagnosis not present

## 2017-12-11 DIAGNOSIS — G8929 Other chronic pain: Secondary | ICD-10-CM | POA: Diagnosis present

## 2017-12-11 DIAGNOSIS — R2689 Other abnormalities of gait and mobility: Secondary | ICD-10-CM | POA: Diagnosis present

## 2017-12-11 NOTE — Therapy (Addendum)
[place], [place], [address] Phone: [phone]   Fax:  [phone]  Physical Therapy Treatment / MCD Re-submission  Patient Details  Name: [name] MRN: [id number] Date of Birth: 1966/07/27 Referring Provider: Charlott Rakes, MD   Encounter Date: 12/11/2017  PT End of Session - 12/11/17 0918    Visit Number  3    Number of Visits  13    Date for PT Re-Evaluation  12/26/17    Authorization Type  MCD    PT Start Time  0846    Activity Tolerance  Patient tolerated treatment well    Behavior During Therapy  [facility] for tasks assessed/performed       Past Medical History:  Diagnosis Date  . Asthma   . Gastric reflux   . History of blood transfusion yrs agio, none recent  . Hypertension   . Osteoarthritis   . Sleep apnea    cpap machine has not used since 2014 due to machine/tubing in bad shape   . Syncope and collapse yrs ago    Past Surgical History:  Procedure Laterality Date  . CARDIAC CATHETERIZATION     more than 5 years ago  . CARDIAC CATHETERIZATION  06-07-2012   [facility]: Normal coronary arteries. No significant LVOT/aortic valve gradient  . CHOLECYSTECTOMY    . COLONOSCOPY WITH PROPOFOL N/A 02/27/2014   Procedure: COLONOSCOPY WITH PROPOFOL;  Surgeon: Garlan Fair, MD;  Location: [facility];  Service: Endoscopy;  Laterality: N/A;  . ESOPHAGOGASTRODUODENOSCOPY (EGD) WITH PROPOFOL N/A 02/27/2014   Procedure: ESOPHAGOGASTRODUODENOSCOPY (EGD) WITH PROPOFOL;  Surgeon: Garlan Fair, MD;  Location: [facility];  Service: Endoscopy;  Laterality: N/A;  . EXPLORATORY LAPAROTOMY  09/2001   ELAP, LOA, R partial oophorectomy, repair of bowel injury  . VAGINAL HYSTERECTOMY  2001   Adnexa left in place    There were no vitals filed for this visit.  Subjective Assessment - 12/11/17 0846    Subjective  "I went to the ED due to them giving me the wrong pain medication from my orthopedic. I've  been doing my exercises at home and it seems to be helping. Pt reports she feels she is abvout 50% better compared to her evaluation"    Currently in Pain?  Yes    Pain Score  5     Pain Orientation  Left    Pain Descriptors / Indicators  Aching    Pain Type  Acute pain    Pain Frequency  Intermittent    Aggravating Factors   too much movement    Pain Relieving Factors  ice, heat,          [facility] PT Assessment - 12/11/17 0852      AROM   Lumbar Flexion  60    Lumbar Extension  20    Lumbar - Right Side Bend  20    Lumbar - Left Side Bend  18      Strength   Right Hip Flexion  4-/5    Right Hip Extension  3+/5    Right Hip ABduction  3+/5    Left Hip Flexion  4-/5    Left Hip Extension  3+/5    Left Hip ABduction  3+/5    Right Knee Flexion  4/5    Right Knee Extension  4/5    Left Knee Flexion  4-/5    Left Knee Extension  4/5  [facility] PT Treatment/Exercise - 12/11/17 0905      Lumbar Exercises: Standing   Other Standing Lumbar Exercises  R lateral hip shifting 2 x 10, with R shoulder resting on the wall       Lumbar Exercises: Supine   Bridge  10 reps;2 seconds      Lumbar Exercises: Prone   Single Arm Raise  10 reps;Left;Right keeping core tight    Straight Leg Raise  10 reps;1 second keeping core tight    Opposite Arm/Leg Raise  10 reps;Right arm/Left leg;Left arm/Right leg               PT Short Term Goals - 12/11/17 0857      PT SHORT TERM GOAL #1   Title  pt to be I with initial HEP    Time  3    Period  Weeks    Status  Achieved      PT SHORT TERM GOAL #2   Title  demonstrate proper posture in multiple positions and lifting mechancis to prevent/ reduce low back pain and referral symptoms    Baseline  has been working on good posture    Time  3    Period  Weeks    Status  Achieved      PT SHORT TERM GOAL #3   Title  pt will reported >/= 4 days with no referred pain down the LLE to indicate improveing  condition    Baseline  reprots only occurs about twice a week    Time  3    Period  Weeks    Status  Achieved        PT Long Term Goals - 12/11/17 0859      PT LONG TERM GOAL #1   Title  pt to increase trunk mobility by >/= 70 degrees flexion and >/= 20 degrees with ext/ bil side bending to promote functional trunk mobility for ADLs with </= 2/10 pain    Baseline  UPDATED STATUS; 60 flexion, extension 20, R sidebending 20 L sidebending 18 with 5/10 pain    Time  4    Period  Weeks    Status  On-going    Target Date  12/26/17      PT LONG TERM GOAL #2   Title  improve bil hip/ knee strength to >/= 4/5 in all planes to promote stability and facilitate form / posture and lifting activities    Baseline  UPDATED STATUS: bil hip flexion 4-/5, bil abd 3+/5, extension 3+/5 bil     Time  4    Status  On-going    Target Date  12/26/17      PT LONG TERM GOAL #3   Title  pt to be able to sit/ stand/ walk for >/= 30 min with </= 2/10 pain for functoinal endurance required for community amb and ADLs    Baseline  UPDATED STATUS: sit/ standing 15 min with 5/10 pain     Time  4    Period  Weeks    Status  On-going    Target Date  12/26/17      PT LONG TERM GOAL #4   Title  pt to be I with all HEP given as of last visit to maintain/ progress current level of function    Baseline  UPDATED STATUS: Independent with all HEP provided as of current visit, and progressing as tolerated.     Time  4    Period  Weeks    Status  On-going    Target Date  12/26/17            Plan - 12/11/17 0911    Clinical Impression Statement  pt reports she has been compliant with her HEP and reports she feels she is about 50% improved. she was unable to get the 3 approved visits in the time approved due to problems with transit scheduling. She has met all short term goals and She has not met LTG #1/#2 that was initally submitted but is progressing appropriately toward all  long term goals. She continues to  having low back and LLE referred pain that centralizes with extension biased treatment. she repoorted pain dropped post session from 5/10 to 2/10 with the exercise. updated HEP for bridges, prone alternating UE/LE movement, and hip flexor stretching. she would benefit from continued physical therapy to promote posture and appropriate lifting mechanics to prevent low back pain, improve hip/ core strength/ endurance to promote lifting mechanics, and maximixe her function and work toward remaining goals and independent exercise.     Rehab Potential  Good    PT Frequency  2x / week    PT Duration  4 weeks    PT Treatment/Interventions  ADLs/Self Care Home Management;Cryotherapy;Electrical Stimulation;Iontophoresis 36m/ml Dexamethasone;Moist Heat;Traction;Ultrasound;Balance training;Stair training;Therapeutic activities;Therapeutic exercise;Manual techniques;Taping;Dry needling;Patient/family education    PT Next Visit Plan  review/ update HEP, extension bias progression as tolerated, core strengthening, manual for low back and piriformis, modalities PRN    PT Home Exercise Plan  prone on elbows, piriformis stretching, posterior pelvic tilt, lower trunk rotation. posture handout, prone press-up, prone alternating UE/LE movement, bridges    Consulted and Agree with Plan of Care  Patient       Patient will benefit from skilled therapeutic intervention in order to improve the following deficits and impairments:  Pain, Abnormal gait, Decreased activity tolerance, Postural dysfunction, Improper body mechanics, Decreased range of motion, Decreased strength, Increased muscle spasms, Difficulty walking, Decreased endurance  Visit Diagnosis: Chronic bilateral low back pain with left-sided sciatica  Muscle weakness (generalized)  Abnormal posture  Other abnormalities of gait and mobility  Muscle spasm of back     Problem List Patient Active Problem List   Diagnosis Date Noted  . Acute left-sided low  back pain with left-sided sciatica 11/27/2017  . Low back pain 10/30/2017  . Itching with irritation 06/23/2016  . Gastric reflux 05/04/2016  . Asthma 05/04/2016  . Essential hypertension 07/23/2015  . Mild depression ([place] 07/23/2015  . Chest pain 06/06/2012  . Murmur, cardiac 06/06/2012   KStarr LakePT, DPT, LAT, ATC  12/11/17  1:02 PM      CGrand LedgeCTucson Digestive Institute [facility] Institute[address] StreetGSawyer NAlaska [address]Phone: [phone]  Fax:  [phone] Name: BKysha MurallesMRN: [id number] of Birth: [birth year]

## 2017-12-12 ENCOUNTER — Encounter (INDEPENDENT_AMBULATORY_CARE_PROVIDER_SITE_OTHER): Payer: Self-pay | Admitting: Physical Medicine and Rehabilitation

## 2017-12-12 ENCOUNTER — Ambulatory Visit (INDEPENDENT_AMBULATORY_CARE_PROVIDER_SITE_OTHER): Payer: Medicaid Other | Admitting: Physical Medicine and Rehabilitation

## 2017-12-12 ENCOUNTER — Ambulatory Visit (INDEPENDENT_AMBULATORY_CARE_PROVIDER_SITE_OTHER): Payer: Medicaid Other

## 2017-12-12 ENCOUNTER — Encounter: Payer: Medicaid Other | Admitting: Physical Therapy

## 2017-12-12 VITALS — BP 153/95 | HR 67 | Temp 97.8°F

## 2017-12-12 DIAGNOSIS — M5416 Radiculopathy, lumbar region: Secondary | ICD-10-CM | POA: Diagnosis not present

## 2017-12-12 MED ORDER — METHYLPREDNISOLONE ACETATE 80 MG/ML IJ SUSP
80.0000 mg | Freq: Once | INTRAMUSCULAR | Status: AC
  Administered 2017-12-12: 80 mg

## 2017-12-12 NOTE — Progress Notes (Signed)
   Numeric Pain Rating Scale and Functional Assessment Average Pain (8)   In the last MONTH (on 0-10 scale) has pain interfered with the following?  1. General activity like being  able to carry out your everyday physical activities such as walking, climbing stairs, carrying groceries, or moving a chair?  Rating(9)   +Driver, -BT, -Dye Allergies.  

## 2017-12-12 NOTE — Patient Instructions (Signed)

## 2017-12-14 NOTE — Progress Notes (Signed)
Jenna LernerBetsy [name] - 52 y.o. female MRN [id number]  Date of birth: 10/17/1965  Office Visit Note: Visit Date: 12/12/2017 PCP: Jenna RegisterNewlin, Enobong, MD Referred by: Jenna RegisterNewlin, Enobong, MD  Subjective: Chief Complaint  Patient presents with  . Lower Back - Pain  . Left Leg - Pain   HPI: [name] is a 52 year old female with chronic worsening low back and left hip and leg pain.  She comes in today at the request of Dr. Magnus IvanBlackman for left L4-5 interlaminar epidural steroid injection.  She has had MRI of the lumbar spine which shows borderline foraminal and lateral recess narrowing on the left at L4-5 from foraminal protrusion.  She was told that she had a pinched nerve by her primary care physician.  She really does not have any nerves that are pinched or compressed at all.  She is having radicular leg pain.  The injection  will be diagnostic and hopefully therapeutic. The patient has failed conservative care including time, medications and activity modification.  Depending on relief would consider transforaminal approach at L4.    ROS Otherwise per HPI.  Assessment & Plan: Visit Diagnoses:  1. Lumbar radiculopathy     Plan: No additional findings.   Meds & Orders:  Meds ordered this encounter  Medications  . methylPREDNISolone acetate (DEPO-MEDROL) injection 80 mg    Orders Placed This Encounter  Procedures  . XR C-ARM NO REPORT  . Epidural Steroid injection    Follow-up: Return for Dr. Magnus IvanBlackman.   Procedures: No procedures performed  Lumbar Epidural Steroid Injection - Interlaminar Approach with Fluoroscopic Guidance  Patient: [name]      Date of Birth: 07/07/1966 MRN: [id number] PCP: Jenna RegisterNewlin, Enobong, MD      Visit Date: 12/12/2017   Universal Protocol:     Consent Given By: the patient  Position: PRONE  Additional Comments: Vital signs were monitored before and after the procedure. Patient was prepped and draped in the usual sterile fashion. The correct patient,  procedure, and site was verified.   Injection Procedure Details:  Procedure Site One Meds Administered:  Meds ordered this encounter  Medications  . methylPREDNISolone acetate (DEPO-MEDROL) injection 80 mg     Laterality: Left  Location/Site:  L4-L5  Needle size: 20 G  Needle type: Tuohy  Needle Placement: Paramedian epidural  Findings:   -Comments: Excellent flow of contrast into the epidural space.  Procedure Details: Using a paramedian approach from the side mentioned above, the region overlying the inferior lamina was localized under fluoroscopic visualization and the soft tissues overlying this structure were infiltrated with 4 ml. of 1% Lidocaine without Epinephrine. The Tuohy needle was inserted into the epidural space using a paramedian approach.   The epidural space was localized using loss of resistance along with lateral and bi-planar fluoroscopic views.  After negative aspirate for air, blood, and CSF, a 2 ml. volume of Isovue-250 was injected into the epidural space and the flow of contrast was observed. Radiographs were obtained for documentation purposes.    The injectate was administered into the level noted above.   Additional Comments:  The patient tolerated the procedure well Dressing: Band-Aid    Post-procedure details: Patient was observed during the procedure. Post-procedure instructions were reviewed.  Patient left the clinic in stable condition.   Clinical History: MRI LUMBAR SPINE WITHOUT CONTRAST  TECHNIQUE: Multiplanar, multisequence MR imaging of the lumbar spine was performed. No intravenous contrast was administered.  COMPARISON:  Lumbar radiographs from 05/22/2016  FINDINGS: Segmentation: The lowest  lumbar type non-rib-bearing vertebra is labeled as L5.  Alignment:  3 mm degenerative retrolisthesis at L2-.  Vertebrae: Marrow heterogeneity is present. Although this can be caused by marrow infiltrative processes, the most  common causes include anemia, smoking, obesity, or advancing age. Disc desiccation at all levels between L1 and L5. Mild type 1 degenerative endplate findings at T12-L1.  Conus medullaris and cauda equina: Conus extends to the L1 level. Conus and cauda equina appear normal.  Paraspinal and other soft tissues: A fluid signal intensity lesion of the left kidney is partially characterized on today's exam. This is statistically likely to be a cyst but not technically specific.  Disc levels:  L1-2: No impingement. Disc bulge with small right paracentral disc protrusion.  L2-3: No impingement.  Diffuse disc bulge.  L3-4: Borderline bilateral subarticular lateral recess stenosis due to disc bulge and facet arthropathy.  L4-5: Borderline bilateral subarticular lateral recess stenosis and borderline left foraminal stenosis due to left foraminal disc protrusion, facet arthropathy, and disc bulge.  L5-S1: No impingement.  Bilateral facet arthropathy.  IMPRESSION: 1. Lumbar spondylosis and degenerative disc disease causing borderline impingement at L3-4 and L4-5. 2. Marrow heterogeneity is present. Although this can be caused by marrow infiltrative processes, the most common causes include anemia, smoking, obesity, or advancing age.   Electronically Signed   By: Gaylyn Rong M.D.   On: 11/08/2017 13:11  She reports that  has never smoked. she has never used smokeless tobacco.  Recent Labs    05/29/17 1415  HGBA1C 5.4    Objective:  VS:  HT:    WT:   BMI:     BP:(!) 153/95  HR:67bpm  TEMP:97.8 F (36.6 C)(Oral)  RESP:99 % Physical Exam  Musculoskeletal:  They stand and ambulate with a forward flexed lumbar spine.  There is low back pain with extension of the lumbar spine.  There is good distal strength.    Ortho Exam Imaging: No results found.  Past Medical/Family/Surgical/Social History: Medications & Allergies reviewed per EMR Patient Active  Problem List   Diagnosis Date Noted  . Acute left-sided low back pain with left-sided sciatica 11/27/2017  . Low back pain 10/30/2017  . Itching with irritation 06/23/2016  . Gastric reflux 05/04/2016  . Asthma 05/04/2016  . Essential hypertension 07/23/2015  . Mild depression (HCC) 07/23/2015  . Chest pain 06/06/2012  . Murmur, cardiac 06/06/2012   Past Medical History:  Diagnosis Date  . Asthma   . Gastric reflux   . History of blood transfusion yrs agio, none recent  . Hypertension   . Osteoarthritis   . Sleep apnea    cpap machine has not used since 2014 due to machine/tubing in bad shape   . Syncope and collapse yrs ago   History reviewed. No pertinent family history. Past Surgical History:  Procedure Laterality Date  . CARDIAC CATHETERIZATION     more than 5 years ago  . CARDIAC CATHETERIZATION  06-07-2012   [facility]: Normal coronary arteries. No significant LVOT/aortic valve gradient  . CHOLECYSTECTOMY    . COLONOSCOPY WITH PROPOFOL N/A 02/27/2014   Procedure: COLONOSCOPY WITH PROPOFOL;  Surgeon: Charolett Bumpers, MD;  Location: [facility];  Service: Endoscopy;  Laterality: N/A;  . ESOPHAGOGASTRODUODENOSCOPY (EGD) WITH PROPOFOL N/A 02/27/2014   Procedure: ESOPHAGOGASTRODUODENOSCOPY (EGD) WITH PROPOFOL;  Surgeon: Charolett Bumpers, MD;  Location: [facility];  Service: Endoscopy;  Laterality: N/A;  . EXPLORATORY LAPAROTOMY  09/2001   ELAP, LOA, R partial oophorectomy, repair of bowel injury  .  VAGINAL HYSTERECTOMY  2001   Adnexa left in place   Social History   Occupational History  . Not on file  Tobacco Use  . Smoking status: Never Smoker  . Smokeless tobacco: Never Used  Substance and Sexual Activity  . Alcohol use: No  . Drug use: No  . Sexual activity: No    Birth control/protection: Surgical

## 2017-12-14 NOTE — Procedures (Signed)
Lumbar Epidural Steroid Injection - Interlaminar Approach with Fluoroscopic Guidance  Patient: Jenna LernerBetsy [name]      Date of Birth: 02/10/1966 MRN: [id number] PCP: Hoy RegisterNewlin, Enobong, MD      Visit Date: 12/12/2017   Universal Protocol:     Consent Given By: the patient  Position: PRONE  Additional Comments: Vital signs were monitored before and after the procedure. Patient was prepped and draped in the usual sterile fashion. The correct patient, procedure, and site was verified.   Injection Procedure Details:  Procedure Site One Meds Administered:  Meds ordered this encounter  Medications  . methylPREDNISolone acetate (DEPO-MEDROL) injection 80 mg     Laterality: Left  Location/Site:  L4-L5  Needle size: 20 G  Needle type: Tuohy  Needle Placement: Paramedian epidural  Findings:   -Comments: Excellent flow of contrast into the epidural space.  Procedure Details: Using a paramedian approach from the side mentioned above, the region overlying the inferior lamina was localized under fluoroscopic visualization and the soft tissues overlying this structure were infiltrated with 4 ml. of 1% Lidocaine without Epinephrine. The Tuohy needle was inserted into the epidural space using a paramedian approach.   The epidural space was localized using loss of resistance along with lateral and bi-planar fluoroscopic views.  After negative aspirate for air, blood, and CSF, a 2 ml. volume of Isovue-250 was injected into the epidural space and the flow of contrast was observed. Radiographs were obtained for documentation purposes.    The injectate was administered into the level noted above.   Additional Comments:  The patient tolerated the procedure well Dressing: Band-Aid    Post-procedure details: Patient was observed during the procedure. Post-procedure instructions were reviewed.  Patient left the clinic in stable condition.

## 2017-12-15 ENCOUNTER — Telehealth: Payer: Self-pay | Admitting: Family Medicine

## 2017-12-15 NOTE — Telephone Encounter (Signed)
Call placed to patient #[phone], to discuss SCAT application. No answer. Left patient a message asking her to return my call at [phone].

## 2017-12-18 ENCOUNTER — Encounter (INDEPENDENT_AMBULATORY_CARE_PROVIDER_SITE_OTHER): Payer: Self-pay | Admitting: Orthopaedic Surgery

## 2017-12-18 ENCOUNTER — Ambulatory Visit (INDEPENDENT_AMBULATORY_CARE_PROVIDER_SITE_OTHER): Payer: Medicaid Other | Admitting: Orthopaedic Surgery

## 2017-12-18 DIAGNOSIS — M5442 Lumbago with sciatica, left side: Secondary | ICD-10-CM

## 2017-12-18 NOTE — Progress Notes (Signed)
The patient is following up after having a left-sided L4-L5 translaminar injection by Dr. Alvester MorinNewton due to a symptomatic disc.  She says she does have physical therapy tomorrow.  She states that she is doing much better overall and does not have nearly the pain that she had.  She looks comfortable on exam.  She has negative straight leg raise to the left side and normal strength in her bilateral lower extremities and normal sensation today.  This point she is making good progress.  She can follow-up as needed.  All questions concerns were answered and addressed.  Obviously if she has any worsening symptoms at all she will let us know.

## 2017-12-19 ENCOUNTER — Ambulatory Visit: Payer: Medicaid Other | Admitting: Physical Therapy

## 2017-12-19 ENCOUNTER — Encounter: Payer: Self-pay | Admitting: Physical Therapy

## 2017-12-19 DIAGNOSIS — M62830 Muscle spasm of back: Secondary | ICD-10-CM

## 2017-12-19 DIAGNOSIS — G8929 Other chronic pain: Secondary | ICD-10-CM

## 2017-12-19 DIAGNOSIS — M5442 Lumbago with sciatica, left side: Principal | ICD-10-CM

## 2017-12-19 DIAGNOSIS — M6281 Muscle weakness (generalized): Secondary | ICD-10-CM

## 2017-12-19 DIAGNOSIS — R293 Abnormal posture: Secondary | ICD-10-CM

## 2017-12-19 DIAGNOSIS — R2689 Other abnormalities of gait and mobility: Secondary | ICD-10-CM

## 2017-12-19 NOTE — Patient Instructions (Signed)
Knee Roll    Lying on back, with knees bent and feet flat on floor, arms outstretched to sides, slowly roll both knees to side, hold 5 seconds. Back to starting position, hold 5 seconds. Then to opposite side, hold 5 seconds. Return to starting position. Keep shoulders and arms in contact with floor.   Copyright  VHI. All rights reserved.

## 2017-12-19 NOTE — Therapy (Signed)
[facility] [address] [place], [place], [address] Phone: [phone]   Fax:  [phone]  Physical Therapy Treatment  Patient Details  Name: Jenna LernerBetsy [name] MRN: [id number] Date of Birth: 09/17/1966 Referring Provider: Hoy RegisterNewlin, Enobong, MD   Encounter Date: 12/19/2017  PT End of Session - 12/19/17 1142    Visit Number  4    Number of Visits  13    Date for PT Re-Evaluation  12/26/17    PT Start Time  1103    PT Stop Time  1202    PT Time Calculation (min)  59 min    Activity Tolerance  Patient tolerated treatment well    Behavior During Therapy  [facility] for tasks assessed/performed       Past Medical History:  Diagnosis Date  . Asthma   . Gastric reflux   . History of blood transfusion yrs agio, none recent  . Hypertension   . Osteoarthritis   . Sleep apnea    cpap machine has not used since 2014 due to machine/tubing in bad shape   . Syncope and collapse yrs ago    Past Surgical History:  Procedure Laterality Date  . CARDIAC CATHETERIZATION     more than 5 years ago  . CARDIAC CATHETERIZATION  06-07-2012   [facility]: Normal coronary arteries. No significant LVOT/aortic valve gradient  . CHOLECYSTECTOMY    . COLONOSCOPY WITH PROPOFOL N/A 02/27/2014   Procedure: COLONOSCOPY WITH PROPOFOL;  Surgeon: Charolett BumpersMartin K Johnson, MD;  Location: [facility];  Service: Endoscopy;  Laterality: N/A;  . ESOPHAGOGASTRODUODENOSCOPY (EGD) WITH PROPOFOL N/A 02/27/2014   Procedure: ESOPHAGOGASTRODUODENOSCOPY (EGD) WITH PROPOFOL;  Surgeon: Charolett BumpersMartin K Johnson, MD;  Location: [facility];  Service: Endoscopy;  Laterality: N/A;  . EXPLORATORY LAPAROTOMY  09/2001   ELAP, LOA, R partial oophorectomy, repair of bowel injury  . VAGINAL HYSTERECTOMY  2001   Adnexa left in place    There were no vitals filed for this visit.                   [facility] Adult PT Treatment/Exercise - 12/19/17 0001      Lumbar Exercises: Stretches   Passive  Hamstring Stretch  30 seconds;3 reps    Lower Trunk Rotation  5 reps 10 seconds,  decreased leg pain    Lower Trunk Rotation Limitations  legs on ball    Hip Flexor Stretch  10 seconds 10 X patient preference.  This feels good.    Pelvic Tilt  5 reps   left leg cramping,  better with legs on ball,  cues    Prone on Elbows Stretch  2 reps;30 seconds      Lumbar Exercises: Supine   Other Supine Lumbar Exercises  beep breaths 5 seconds      Lumbar Exercises: Prone   Single Arm Raise  5 reps 2 sets fatigue    Straight Leg Raise  10 reps    Opposite Arm/Leg Raise  5 reps 2 sets,  fatigue.  no pain      Modalities   Modalities  Moist Heat      Moist Heat Therapy   Number Minutes Moist Heat  15 Minutes    Moist Heat Location  Lumbar Spine prone             PT Education - 12/19/17 1142    Education provided  Yes    Education Details  HEP    Person(s) Educated  Patient    Methods  Explanation;Demonstration;Tactile cues;Handout    Comprehension  Verbalized understanding;Returned demonstration       PT Short Term Goals - 12/19/17 1146      PT SHORT TERM GOAL #1   Title  pt to be I with initial HEP    Baseline  needs encouragement to be consistant and increase reps    Time  3    Period  Weeks    Status  On-going      PT SHORT TERM GOAL #2   Title  demonstrate proper posture in multiple positions and lifting mechancis to prevent/ reduce low back pain and referral symptoms    Baseline  uses good posture in clinic    Time  3    Period  Weeks    Status  Achieved      PT SHORT TERM GOAL #3   Title  pt will reported >/= 4 days with no referred pain down the LLE to indicate improveing condition    Time  3    Period  Weeks    Status  Achieved        PT Long Term Goals - 12/11/17 0859      PT LONG TERM GOAL #1   Title  pt to increase trunk mobility by >/= 70 degrees flexion and >/= 20 degrees with ext/ bil side bending to promote functional trunk mobility for ADLs with  </= 2/10 pain    Baseline  UPDATED STATUS; 60 flexion, extension 20, R sidebending 20 L sidebending 18 with 5/10 pain    Time  4    Period  Weeks    Status  On-going    Target Date  12/26/17      PT LONG TERM GOAL #2   Title  improve bil hip/ knee strength to >/= 4/5 in all planes to promote stability and facilitate form / posture and lifting activities    Baseline  UPDATED STATUS: bil hip flexion 4-/5, bil abd 3+/5, extension 3+/5 bil     Time  4    Status  On-going    Target Date  12/26/17      PT LONG TERM GOAL #3   Title  pt to be able to sit/ stand/ walk for >/= 30 min with </= 2/10 pain for functoinal endurance required for community amb and ADLs    Baseline  UPDATED STATUS: sit/ standing 15 min with 5/10 pain     Time  4    Period  Weeks    Status  On-going    Target Date  12/26/17      PT LONG TERM GOAL #4   Title  pt to be I with all HEP given as of last visit to maintain/ progress current level of function    Baseline  UPDATED STATUS: Independent with all HEP provided as of current visit, and progressing as tolerated.     Time  4    Period  Weeks    Status  On-going    Target Date  12/26/17            Plan - 12/19/17 1143    Clinical Impression Statement  Patient has had an injection in spine and pain is less.  No pain at end of session today in back or leg.  Extension focus today,  Hip flexor lengthened and was able to rest thigh on table  with back in neutral after stretches. i encouraged her to stop if she get pain  and try to  do exercises to relieve,  She is using good body mechanics for bed mobility in clinic.    PT Next Visit Plan  review/ update HEP, extension bias progression as tolerated, core strengthening, manual for low back and piriformis, modalities PRN    PT Home Exercise Plan  prone on elbows, piriformis stretching, posterior pelvic tilt, lower trunk rotation. posture handout, prone press-up, prone alternating UE/LE movement, bridges    Consulted and  Agree with Plan of Care  Patient       Patient will benefit from skilled therapeutic intervention in order to improve the following deficits and impairments:     Visit Diagnosis: Chronic bilateral low back pain with left-sided sciatica  Muscle weakness (generalized)  Abnormal posture  Other abnormalities of gait and mobility  Muscle spasm of back     Problem List Patient Active Problem List   Diagnosis Date Noted  . Acute left-sided low back pain with left-sided sciatica 11/27/2017  . Low back pain 10/30/2017  . Itching with irritation 06/23/2016  . Gastric reflux 05/04/2016  . Asthma 05/04/2016  . Essential hypertension 07/23/2015  . Mild depression (HCC) 07/23/2015  . Chest pain 06/06/2012  . Murmur, cardiac 06/06/2012    [facility]  PTA 12/19/2017, 11:48 AM  [facility] [address] [place], [place], [address] Phone: [phone]   Fax:  [phone]  Name: [name] MRN: [id number] Date of Birth: 01-12-1966

## 2017-12-21 ENCOUNTER — Ambulatory Visit: Payer: Medicaid Other | Admitting: Physical Therapy

## 2017-12-21 ENCOUNTER — Encounter: Payer: Self-pay | Admitting: Physical Therapy

## 2017-12-21 DIAGNOSIS — R293 Abnormal posture: Secondary | ICD-10-CM

## 2017-12-21 DIAGNOSIS — R2689 Other abnormalities of gait and mobility: Secondary | ICD-10-CM

## 2017-12-21 DIAGNOSIS — M5442 Lumbago with sciatica, left side: Secondary | ICD-10-CM | POA: Diagnosis not present

## 2017-12-21 DIAGNOSIS — M6281 Muscle weakness (generalized): Secondary | ICD-10-CM

## 2017-12-21 DIAGNOSIS — M62830 Muscle spasm of back: Secondary | ICD-10-CM

## 2017-12-21 DIAGNOSIS — G8929 Other chronic pain: Secondary | ICD-10-CM

## 2017-12-21 NOTE — Therapy (Signed)
[facility] [address] [place], [place], [address] Phone: [phone]   Fax:  [phone]  Physical Therapy Treatment  Patient Details  Name: Jenna LernerBetsy Wack MRN: [id number] Date of Birth: 02/12/1966 Referring Provider: Hoy RegisterNewlin, Enobong, MD   Encounter Date: 12/21/2017  PT End of Session - 12/21/17 1448    Visit Number  5    Number of Visits  13    Date for PT Re-Evaluation  12/26/17    PT Start Time  1425    PT Stop Time  1514    PT Time Calculation (min)  49 min    Activity Tolerance  Patient tolerated treatment well    Behavior During Therapy  [facility] for tasks assessed/performed       Past Medical History:  Diagnosis Date  . Asthma   . Gastric reflux   . History of blood transfusion yrs agio, none recent  . Hypertension   . Osteoarthritis   . Sleep apnea    cpap machine has not used since 2014 due to machine/tubing in bad shape   . Syncope and collapse yrs ago    Past Surgical History:  Procedure Laterality Date  . CARDIAC CATHETERIZATION     more than 5 years ago  . CARDIAC CATHETERIZATION  06-07-2012   [facility]: Normal coronary arteries. No significant LVOT/aortic valve gradient  . CHOLECYSTECTOMY    . COLONOSCOPY WITH PROPOFOL N/A 02/27/2014   Procedure: COLONOSCOPY WITH PROPOFOL;  Surgeon: Charolett BumpersMartin K Johnson, MD;  Location: [facility];  Service: Endoscopy;  Laterality: N/A;  . ESOPHAGOGASTRODUODENOSCOPY (EGD) WITH PROPOFOL N/A 02/27/2014   Procedure: ESOPHAGOGASTRODUODENOSCOPY (EGD) WITH PROPOFOL;  Surgeon: Charolett BumpersMartin K Johnson, MD;  Location: [facility];  Service: Endoscopy;  Laterality: N/A;  . EXPLORATORY LAPAROTOMY  09/2001   ELAP, LOA, R partial oophorectomy, repair of bowel injury  . VAGINAL HYSTERECTOMY  2001   Adnexa left in place    There were no vitals filed for this visit.  Subjective Assessment - 12/21/17 1427    Subjective  "i've been doing the exercises and amd unsure if this shot has helped"      Currently in Pain?  Yes    Pain Score  4     Pain Location  Back    Pain Orientation  Left    Pain Descriptors / Indicators  Aching    Pain Type  Acute pain    Pain Frequency  Intermittent    Aggravating Factors   laying down,     Pain Relieving Factors  ice, heat, exercise                      [facility] Adult PT Treatment/Exercise - 12/21/17 1432      Lumbar Exercises: Stretches   Passive Hamstring Stretch  2 reps;30 seconds    Single Knee to Chest Stretch  2 reps;Left;30 seconds    Lower Trunk Rotation  -- 1 x 10       Lumbar Exercises: Aerobic   Nustep  L5 x 5 min UE/LE only      Lumbar Exercises: Standing   Other Standing Lumbar Exercises  pressing down with bil UE through green pysioball breathing out 2 x 10 holding 3 sec for core activation    Other Standing Lumbar Exercises  repeated trunk extension 4 x 15      Lumbar Exercises: Seated   Hip Flexion on Ball  Strengthening;Both;10 reps keeping core tight while seated on physioball  Lumbar Exercises: Supine   Bridge  10 reps;2 seconds with glute set      Lumbar Exercises: Prone   Opposite Arm/Leg Raise  10 reps;1 second;Right arm/Left leg;Left arm/Right leg      Knee/Hip Exercises: Standing   Hip Abduction  2 sets;Both;Stengthening;Knee straight    Hip Extension  2 sets;Both;Stengthening;Knee straight      Moist Heat Therapy   Number Minutes Moist Heat  10 Minutes    Moist Heat Location  Lumbar Spine prone              PT Education - 12/21/17 1457    Education Details  updated HEP for standing hip extension and bridges    Person(s) Educated  Patient    Methods  Explanation;Verbal cues;Handout    Comprehension  Verbalized understanding;Verbal cues required       PT Short Term Goals - 12/19/17 1146      PT SHORT TERM GOAL #1   Title  pt to be I with initial HEP    Baseline  needs encouragement to be consistant and increase reps    Time  3    Period  Weeks    Status  On-going      PT  SHORT TERM GOAL #2   Title  demonstrate proper posture in multiple positions and lifting mechancis to prevent/ reduce low back pain and referral symptoms    Baseline  uses good posture in clinic    Time  3    Period  Weeks    Status  Achieved      PT SHORT TERM GOAL #3   Title  pt will reported >/= 4 days with no referred pain down the LLE to indicate improveing condition    Time  3    Period  Weeks    Status  Achieved        PT Long Term Goals - 12/11/17 0859      PT LONG TERM GOAL #1   Title  pt to increase trunk mobility by >/= 70 degrees flexion and >/= 20 degrees with ext/ bil side bending to promote functional trunk mobility for ADLs with </= 2/10 pain    Baseline  UPDATED STATUS; 60 flexion, extension 20, R sidebending 20 L sidebending 18 with 5/10 pain    Time  4    Period  Weeks    Status  On-going    Target Date  12/26/17      PT LONG TERM GOAL #2   Title  improve bil hip/ knee strength to >/= 4/5 in all planes to promote stability and facilitate form / posture and lifting activities    Baseline  UPDATED STATUS: bil hip flexion 4-/5, bil abd 3+/5, extension 3+/5 bil     Time  4    Status  On-going    Target Date  12/26/17      PT LONG TERM GOAL #3   Title  pt to be able to sit/ stand/ walk for >/= 30 min with </= 2/10 pain for functoinal endurance required for community amb and ADLs    Baseline  UPDATED STATUS: sit/ standing 15 min with 5/10 pain     Time  4    Period  Weeks    Status  On-going    Target Date  12/26/17      PT LONG TERM GOAL #4   Title  pt to be I with all HEP given as of last visit to maintain/ progress  current level of function    Baseline  UPDATED STATUS: Independent with all HEP provided as of current visit, and progressing as tolerated.     Time  4    Period  Weeks    Status  On-going    Target Date  12/26/17            Plan - 12/21/17 1458    Clinical Impression Statement  pt reported increased soreness today compared to last  session at 4/10. continued progression of extension biased in standing which she reported centralization and reported decreased pain. continued working on core strengthening and hip strengthening to promote proper posture/ lifting mechanics to reduce stress on the low back. pt reported no pain end of session but continued MHP for muscle stiffness.     PT Next Visit Plan  review/ update HEP, core/ hip  strengthening, manual for low back and piriformis, modalities PRN    PT Home Exercise Plan  prone on elbows, piriformis stretching, posterior pelvic tilt, lower trunk rotation. posture handout, prone press-up, prone alternating UE/LE movement, bridges, standing extension    Consulted and Agree with Plan of Care  Patient       Patient will benefit from skilled therapeutic intervention in order to improve the following deficits and impairments:  Pain, Abnormal gait, Decreased activity tolerance, Postural dysfunction, Improper body mechanics, Decreased range of motion, Decreased strength, Increased muscle spasms, Difficulty walking, Decreased endurance  Visit Diagnosis: Chronic bilateral low back pain with left-sided sciatica  Muscle weakness (generalized)  Abnormal posture  Other abnormalities of gait and mobility  Muscle spasm of back     Problem List Patient Active Problem List   Diagnosis Date Noted  . Acute left-sided low back pain with left-sided sciatica 11/27/2017  . Low back pain 10/30/2017  . Itching with irritation 06/23/2016  . Gastric reflux 05/04/2016  . Asthma 05/04/2016  . Essential hypertension 07/23/2015  . Mild depression (HCC) 07/23/2015  . Chest pain 06/06/2012  . Murmur, cardiac 06/06/2012   Lulu Riding PT, DPT, LAT, ATC  12/21/17  3:11 PM      [facility] [address] [place], [place], [address] Phone: [phone]   Fax:  [phone]  Name: Brinly Maietta MRN: [id number] Date of Birth:  [birth year]

## 2017-12-26 ENCOUNTER — Encounter: Payer: Self-pay | Admitting: Physical Therapy

## 2017-12-26 ENCOUNTER — Ambulatory Visit: Payer: Medicaid Other | Admitting: Physical Therapy

## 2017-12-26 DIAGNOSIS — R293 Abnormal posture: Secondary | ICD-10-CM

## 2017-12-26 DIAGNOSIS — G8929 Other chronic pain: Secondary | ICD-10-CM

## 2017-12-26 DIAGNOSIS — R2689 Other abnormalities of gait and mobility: Secondary | ICD-10-CM

## 2017-12-26 DIAGNOSIS — M6281 Muscle weakness (generalized): Secondary | ICD-10-CM

## 2017-12-26 DIAGNOSIS — M5442 Lumbago with sciatica, left side: Principal | ICD-10-CM

## 2017-12-26 DIAGNOSIS — M62830 Muscle spasm of back: Secondary | ICD-10-CM

## 2017-12-26 NOTE — Therapy (Signed)
[facility] [address] [place], [place], [address] Phone: [phone]   Fax:  [phone]  Physical Therapy Treatment  Patient Details  Name: Jenna LernerBetsy [name] MRN: [id number] Date of Birth: 08/16/1966 Referring Provider: Hoy RegisterNewlin, Enobong, MD   Encounter Date: 12/26/2017  PT End of Session - 12/26/17 1500    Visit Number  6    Number of Visits  13    Date for PT Re-Evaluation  12/26/17    PT Start Time  1517    PT Stop Time  1600    PT Time Calculation (min)  43 min    Activity Tolerance  Patient tolerated treatment well    Behavior During Therapy  [facility] for tasks assessed/performed       Past Medical History:  Diagnosis Date  . Asthma   . Gastric reflux   . History of blood transfusion yrs agio, none recent  . Hypertension   . Osteoarthritis   . Sleep apnea    cpap machine has not used since 2014 due to machine/tubing in bad shape   . Syncope and collapse yrs ago    Past Surgical History:  Procedure Laterality Date  . CARDIAC CATHETERIZATION     more than 5 years ago  . CARDIAC CATHETERIZATION  06-07-2012   [facility]: Normal coronary arteries. No significant LVOT/aortic valve gradient  . CHOLECYSTECTOMY    . COLONOSCOPY WITH PROPOFOL N/A 02/27/2014   Procedure: COLONOSCOPY WITH PROPOFOL;  Surgeon: Charolett BumpersMartin K Johnson, MD;  [facility];  Service: Endoscopy;  Laterality: N/A;  . ESOPHAGOGASTRODUODENOSCOPY (EGD) WITH PROPOFOL N/A 02/27/2014   Procedure: ESOPHAGOGASTRODUODENOSCOPY (EGD) WITH PROPOFOL;  Surgeon: Charolett BumpersMartin K Johnson, MD;  [facility];  Service: Endoscopy;  Laterality: N/A;  . EXPLORATORY LAPAROTOMY  09/2001   ELAP, LOA, R partial oophorectomy, repair of bowel injury  . VAGINAL HYSTERECTOMY  2001   Adnexa left in place    There were no vitals filed for this visit.  Subjective Assessment - 12/26/17 1425    Subjective  Last night got out of the TUB and hurt her back.  Prior to tub she had no pain  .  Today 8/10   has been doing the exercises    Pain Orientation  Left;Lower;Posterior    Pain Descriptors / Indicators  Sharp    Pain Radiating Towards  top of left foot    Pain Frequency  Intermittent    Aggravating Factors   getting out of the TUB    Pain Relieving Factors  warm water,  walking    Effect of Pain on Daily Activities  sitting and standing had                      [facility] Adult PT Treatment/Exercise - 12/26/17 0001      Therapeutic Activites    Therapeutic Activities  -- simulated stepping in and out of tub unable to get LT high       Lumbar Exercises: Aerobic   Nustep  L4 6 minutes LE/UE      Lumbar Exercises: Supine   Glut Set  10 reps    Bridge  10 reps legs over bolster, and 1 set with feet on bed   decreased pi    Straight Leg Raise  10 reps 2 sets      Lumbar Exercises: Sidelying   Clam  10 reps each cued initially no pain      Knee/Hip Exercises: Standing   Hip Abduction  2 sets;Both;Stengthening;Knee straight pain free    Hip Extension  2 sets;Both;Stengthening;Knee straight painfree      Modalities   Modalities  Moist Heat      Moist Heat Therapy   Moist Heat Location  Lumbar Spine During supine and sidelying exercises             PT Education - 12/26/17 1459    Education provided  Yes    Education Details  Exercise technique,  ways to decrease pain.    Person(s) Educated  Patient    Methods  Explanation;Demonstration    Comprehension  Verbalized understanding;Returned demonstration       PT Short Term Goals - 12/19/17 1146      PT SHORT TERM GOAL #1   Title  pt to be I with initial HEP    Baseline  needs encouragement to be consistant and increase reps    Time  3    Period  Weeks    Status  On-going      PT SHORT TERM GOAL #2   Title  demonstrate proper posture in multiple positions and lifting mechancis to prevent/ reduce low back pain and referral symptoms    Baseline  uses good posture in clinic    Time  3     Period  Weeks    Status  Achieved      PT SHORT TERM GOAL #3   Title  pt will reported >/= 4 days with no referred pain down the LLE to indicate improveing condition    Time  3    Period  Weeks    Status  Achieved        PT Long Term Goals - 12/11/17 0859      PT LONG TERM GOAL #1   Title  pt to increase trunk mobility by >/= 70 degrees flexion and >/= 20 degrees with ext/ bil side bending to promote functional trunk mobility for ADLs with </= 2/10 pain    Baseline  UPDATED STATUS; 60 flexion, extension 20, R sidebending 20 L sidebending 18 with 5/10 pain    Time  4    Period  Weeks    Status  On-going    Target Date  12/26/17      PT LONG TERM GOAL #2   Title  improve bil hip/ knee strength to >/= 4/5 in all planes to promote stability and facilitate form / posture and lifting activities    Baseline  UPDATED STATUS: bil hip flexion 4-/5, bil abd 3+/5, extension 3+/5 bil     Time  4    Status  On-going    Target Date  12/26/17      PT LONG TERM GOAL #3   Title  pt to be able to sit/ stand/ walk for >/= 30 min with </= 2/10 pain for functoinal endurance required for community amb and ADLs    Baseline  UPDATED STATUS: sit/ standing 15 min with 5/10 pain     Time  4    Period  Weeks    Status  On-going    Target Date  12/26/17      PT LONG TERM GOAL #4   Title  pt to be I with all HEP given as of last visit to maintain/ progress current level of function    Baseline  UPDATED STATUS: Independent with all HEP provided as of current visit, and progressing as tolerated.     Time  4    Period  Weeks    Status  On-going    Target Date  12/26/17            Plan - 12/26/17 1500    Clinical Impression Statement  pain decreased from 8/10 to no pain with exercise.  No leg pain until yesterday.      PT Next Visit Plan  review/ update HEP, core/ hip  strengthening, manual for low back and piriformis, modalities PRN  simulate in / out of TUB,      PT Home Exercise Plan  prone on  elbows, piriformis stretching, posterior pelvic tilt, lower trunk rotation. posture handout, prone press-up, prone alternating UE/LE movement, bridges, standing extension    Consulted and Agree with Plan of Care  Patient       Patient will benefit from skilled therapeutic intervention in order to improve the following deficits and impairments:     Visit Diagnosis: Chronic bilateral low back pain with left-sided sciatica  Muscle weakness (generalized)  Abnormal posture  Other abnormalities of gait and mobility  Muscle spasm of back     Problem List Patient Active Problem List   Diagnosis Date Noted  . Acute left-sided low back pain with left-sided sciatica 11/27/2017  . Low back pain 10/30/2017  . Itching with irritation 06/23/2016  . Gastric reflux 05/04/2016  . Asthma 05/04/2016  . Essential hypertension 07/23/2015  . Mild depression (HCC) 07/23/2015  . Chest pain 06/06/2012  . Murmur, cardiac 06/06/2012    [facility]  PTA 12/26/2017, 3:02 PM  [facility] [address] [place], [place], [address] Phone: [phone]   Fax:  [phone]  Name: [name] MRN: [id number] Date of Birth: 1966/03/16

## 2017-12-28 ENCOUNTER — Telehealth: Payer: Self-pay | Admitting: Family Medicine

## 2017-12-28 ENCOUNTER — Encounter: Payer: Self-pay | Admitting: Physical Therapy

## 2017-12-28 ENCOUNTER — Ambulatory Visit: Payer: Medicaid Other | Admitting: Physical Therapy

## 2017-12-28 DIAGNOSIS — R2689 Other abnormalities of gait and mobility: Secondary | ICD-10-CM

## 2017-12-28 DIAGNOSIS — R293 Abnormal posture: Secondary | ICD-10-CM

## 2017-12-28 DIAGNOSIS — M5442 Lumbago with sciatica, left side: Secondary | ICD-10-CM | POA: Diagnosis not present

## 2017-12-28 DIAGNOSIS — G8929 Other chronic pain: Secondary | ICD-10-CM

## 2017-12-28 DIAGNOSIS — M6281 Muscle weakness (generalized): Secondary | ICD-10-CM

## 2017-12-28 DIAGNOSIS — M62830 Muscle spasm of back: Secondary | ICD-10-CM

## 2017-12-28 NOTE — Therapy (Addendum)
[facility] [address] [place], [place], [address] Phone: [phone]   Fax:  [phone]  Physical Therapy Treatment / Re-certification  Patient Details  Name: [name] MRN: [id number] Date of Birth: [birth year] Referring Provider: Hoy Register, MD   Encounter Date: 12/28/2017  PT End of Session - 12/28/17 1535    Visit Number  7    Number of Visits  13    Date for PT Re-Evaluation 01/25/2018   Authorization Type  MCD    PT Start Time  1530    PT Stop Time  1610    PT Time Calculation (min)  40 min    Activity Tolerance  Patient tolerated treatment well    Behavior During Therapy  [facility] for tasks assessed/performed       Past Medical History:  Diagnosis Date  . Asthma   . Gastric reflux   . History of blood transfusion yrs agio, none recent  . Hypertension   . Osteoarthritis   . Sleep apnea    cpap machine has not used since 2014 due to machine/tubing in bad shape   . Syncope and collapse yrs ago    Past Surgical History:  Procedure Laterality Date  . CARDIAC CATHETERIZATION     more than 5 years ago  . CARDIAC CATHETERIZATION  06-07-2012   [facility]: Normal coronary arteries. No significant LVOT/aortic valve gradient  . CHOLECYSTECTOMY    . COLONOSCOPY WITH PROPOFOL N/A 02/27/2014   Procedure: COLONOSCOPY WITH PROPOFOL;  Surgeon: Charolett Bumpers, MD;  Location: [facility];  Service: Endoscopy;  Laterality: N/A;  . ESOPHAGOGASTRODUODENOSCOPY (EGD) WITH PROPOFOL N/A 02/27/2014   Procedure: ESOPHAGOGASTRODUODENOSCOPY (EGD) WITH PROPOFOL;  Surgeon: Charolett Bumpers, MD;  [facility];  Service: Endoscopy;  Laterality: N/A;  . EXPLORATORY LAPAROTOMY  09/2001   ELAP, LOA, R partial oophorectomy, repair of bowel injury  . VAGINAL HYSTERECTOMY  2001   Adnexa left in place    There were no vitals filed for this visit.  Subjective Assessment - 12/28/17 1530    Subjective  " I think the weather is causing  me issues, I do think it is better today than the last visit.     Patient Stated Goals  get the pain to feel better, return to walking    Currently in Pain?  Yes    Pain Score  7     Pain Orientation  Left;Lower    Pain Type  Chronic pain    Aggravating Factors   getting out of the tub    Pain Relieving Factors  exercise,          [facility] PT Assessment - 12/28/17 1537      AROM   Lumbar Flexion  58    Lumbar Extension  22    Lumbar - Right Side Bend  20    Lumbar - Left Side Bend  12      Strength   Right Hip Flexion  4-/5    Right Hip Extension  3+/5    Right Hip ABduction  3+/5    Left Hip Flexion  4-/5    Left Hip Extension  3+/5    Left Hip ABduction  3+/5    Right Knee Flexion  4/5    Right Knee Extension  4/5    Left Knee Flexion  4-/5    Left Knee Extension  4/5  [facility] Adult PT Treatment/Exercise - 12/28/17 1541      Lumbar Exercises: Aerobic   Nustep  L6 x 5 min LE / UE      Lumbar Exercises: Standing   Other Standing Lumbar Exercises  pressing down with bil UE through green pysioball breathing out 2 x 10 holding 3 sec for core activation, holding hands to physioball on table 2 x 10 bil hip abduction/ extension  1 x 10 rolling ball to L/ R for oblique activation    Other Standing Lumbar Exercises  repeated trunk L lean 2 x 20, repeated extension while in L trunk lean 2 x 20      Knee/Hip Exercises: Standing   Hip Abduction  2 sets;10 reps;Knee straight hands on red physioball    Hip Extension  2 sets;Stengthening;Knee straight holding hands on     Gait Training  proper walking using exaggerated heel strike/ toe of with equal stride to reduce antalgic gait pattern and lateral trunk sway. 4 x 20 ft             PT Education - 12/28/17 1611    Education provided  Yes    Education Details  reviewed HEP given previously and discussed importance of standing extension and doing them frequently being more proactive versus reactive.  benefits of posture avoid trunk flexion. gait training with proper form    Person(s) Educated  Patient    Methods  Explanation;Verbal cues;Handout    Comprehension  Verbalized understanding;Verbal cues required       PT Short Term Goals - 12/28/17 1554      PT SHORT TERM GOAL #1   Title  pt to be I with initial HEP    Baseline  needs encouragement to be consistant and increase reps    Time  3    Period  Weeks    Status  Achieved    Target Date  01/11/18      PT SHORT TERM GOAL #2   Title  demonstrate proper posture in multiple positions and lifting mechancis to prevent/ reduce low back pain and referral symptoms    Time  3    Period  Weeks      PT SHORT TERM GOAL #3   Title  pt will reported >/= 4 days with no referred pain down the LLE to indicate improveing condition    Time  3    Period  Weeks    Status  Achieved        PT Long Term Goals - 12/28/17 1555      PT LONG TERM GOAL #1   Title  pt to increase trunk mobility by >/= 70 degrees flexion and >/= 20 degrees with ext/ bil side bending to promote functional trunk mobility for ADLs with </= 2/10 pain    Baseline  UPDATED STATUS; 60 flexion, extension 20, R sidebending 20 L sidebending 18 with 5/10 pain    Time  4    Period  Weeks    Status  On-going    Target Date  01/18/18      PT LONG TERM GOAL #2   Title  improve bil hip/ knee strength to >/= 4/5 in all planes to promote stability and facilitate form / posture and lifting activities    Baseline  UPDATED STATUS: bil hip flexion 4-/5, bil abd 3+/5, extension 3+/5 bil     Time  4    Period  Weeks    Status  On-going  Target Date  01/18/18      PT LONG TERM GOAL #3   Title  pt to be able to sit/ stand/ walk for >/= 30 min with </= 2/10 pain for functoinal endurance required for community amb and ADLs    Baseline  UPDATED STATUS: sit/ standing 15 min with 5/10 pain     Time  4    Period  Weeks    Status  On-going    Target Date  01/25/18      PT LONG TERM  GOAL #4   Title  pt to be I with all HEP given as of last visit to maintain/ progress current level of function    Baseline  UPDATED STATUS: Independent with all HEP provided as of current visit, and progressing as tolerated.     Time  4    Period  Weeks    Status  On-going    Target Date  01/25/18            Plan - 12/28/17 1612    Clinical Impression Statement  pt reports pain at 7/10 today with continued soreness noted in the L hip. she is improving with trunk mobillity and is working toward long term goals. she continues to report low back and L hip / LE pain that is relieved with repeated extension exercises. continued working on hip and core strength. end of session she reported no pain and declined modalities. She would benefit from continued physical therapy to promote posture, reduce pain , increase hip/ core strength and work toward remaining exercise and D/C.     Rehab Potential  Good    PT Frequency  2x / week    PT Duration  4 weeks    PT Treatment/Interventions  ADLs/Self Care Home Management;Cryotherapy;Electrical Stimulation;Iontophoresis 4mg /ml Dexamethasone;Moist Heat;Traction;Ultrasound;Balance training;Stair training;Therapeutic activities;Therapeutic exercise;Manual techniques;Taping;Dry needling;Patient/family education    PT Next Visit Plan  review/ update HEP, core/ hip  strengthening, manual for low back and piriformis, modalities PRN  simulate in / out of TUB, and other trunk flexion exercise to promote proper mechanics.     PT Home Exercise Plan  prone on elbows, piriformis stretching, posterior pelvic tilt, lower trunk rotation. posture handout, prone press-up, prone alternating UE/LE movement, bridges, standing extension    Consulted and Agree with Plan of Care  Patient       Patient will benefit from skilled therapeutic intervention in order to improve the following deficits and impairments:  Pain, Abnormal gait, Decreased activity tolerance, Postural  dysfunction, Improper body mechanics, Decreased range of motion, Decreased strength, Increased muscle spasms, Difficulty walking, Decreased endurance  Visit Diagnosis: Chronic bilateral low back pain with left-sided sciatica  Muscle weakness (generalized)  Abnormal posture  Other abnormalities of gait and mobility  Muscle spasm of back     Problem List Patient Active Problem List   Diagnosis Date Noted  . Acute left-sided low back pain with left-sided sciatica 11/27/2017  . Low back pain 10/30/2017  . Itching with irritation 06/23/2016  . Gastric reflux 05/04/2016  . Asthma 05/04/2016  . Essential hypertension 07/23/2015  . Mild depression (HCC) 07/23/2015  . Chest pain 06/06/2012  . Murmur, cardiac 06/06/2012   Lulu Riding PT, DPT, LAT, ATC  12/28/17  4:23 PM      [facility] [address] [place], [place], [address] Phone: [phone]   Fax:  [phone]  Name: [name] MRN: [id number] Date of Birth: 11-27-1965

## 2017-12-28 NOTE — Telephone Encounter (Signed)
Call placed to patient #[phone], to get an update regarding her SCAT application . No answer. Left patient a message asking her to return my call at [phone]

## 2018-01-02 ENCOUNTER — Ambulatory Visit: Payer: Medicaid Other | Admitting: Physical Therapy

## 2018-01-02 ENCOUNTER — Encounter: Payer: Self-pay | Admitting: Physical Therapy

## 2018-01-02 DIAGNOSIS — R293 Abnormal posture: Secondary | ICD-10-CM

## 2018-01-02 DIAGNOSIS — M6281 Muscle weakness (generalized): Secondary | ICD-10-CM

## 2018-01-02 DIAGNOSIS — M5442 Lumbago with sciatica, left side: Secondary | ICD-10-CM | POA: Diagnosis not present

## 2018-01-02 DIAGNOSIS — R2689 Other abnormalities of gait and mobility: Secondary | ICD-10-CM

## 2018-01-02 DIAGNOSIS — G8929 Other chronic pain: Secondary | ICD-10-CM

## 2018-01-02 DIAGNOSIS — M62830 Muscle spasm of back: Secondary | ICD-10-CM

## 2018-01-02 NOTE — Patient Instructions (Signed)
Resistive Band Rowing    With resistive band anchored in door, grasp both ends. Keeping elbows bent, pull back, squeezing shoulder blades together. Hold 1__ seconds. Repeat __10__ times. Do __1-2__ sessions per day.   Keep a crunched forward posture.  Keep knees bent.  http://gt2.exer.us/98   Copyright  VHI. All rights reserved.

## 2018-01-02 NOTE — Therapy (Addendum)
[place], [place], [address] Phone: [phone]   Fax:  [phone]  Physical Therapy Treatment  Patient Details  Name: [name] MRN: [id number] Date of Birth: [birth year] Referring Provider: Charlott Rakes, MD   Encounter Date: 01/02/2018  PT End of Session - 01/02/18 1327    Visit Number  8    Number of Visits  13    Date for PT Re-Evaluation 01/25/2018    PT Start Time  1103    PT Stop Time  1146    PT Time Calculation (min)  43 min    Activity Tolerance  Patient tolerated treatment well    Behavior During Therapy  [facility] for tasks assessed/performed       Past Medical History:  Diagnosis Date  . Asthma   . Gastric reflux   . History of blood transfusion yrs agio, none recent  . Hypertension   . Osteoarthritis   . Sleep apnea    cpap machine has not used since 2014 due to machine/tubing in bad shape   . Syncope and collapse yrs ago    Past Surgical History:  Procedure Laterality Date  . CARDIAC CATHETERIZATION     more than 5 years ago  . CARDIAC CATHETERIZATION  06-07-2012   [facility]: Normal coronary arteries. No significant LVOT/aortic valve gradient  . CHOLECYSTECTOMY    . COLONOSCOPY WITH PROPOFOL N/A 02/27/2014   Procedure: COLONOSCOPY WITH PROPOFOL;  Surgeon: Garlan Fair, MD;  Location: [facility];  Service: Endoscopy;  Laterality: N/A;  . ESOPHAGOGASTRODUODENOSCOPY (EGD) WITH PROPOFOL N/A 02/27/2014   Procedure: ESOPHAGOGASTRODUODENOSCOPY (EGD) WITH PROPOFOL;  Surgeon: Garlan Fair, MD;  Location: [facility];  Service: Endoscopy;  Laterality: N/A;  . EXPLORATORY LAPAROTOMY  09/2001   ELAP, LOA, R partial oophorectomy, repair of bowel injury  . VAGINAL HYSTERECTOMY  2001   Adnexa left in place    There were no vitals filed for this visit.  Subjective Assessment - 01/02/18 1107    Subjective  I have been doing well.  No pain.    Currently in Pain?  No/denies    Pain  Score  1     Pain Location  Back    Pain Orientation  Left;Lower    Pain Descriptors / Indicators  Cramping just a touch hamstrings    Pain Type  Chronic pain    Pain Radiating Towards  lateral lower leg    Aggravating Factors   Keeping leg left in one position too long     Pain Relieving Factors  using different technique getting in/  out of tub                No data recorded       [facility] Adult PT Treatment/Exercise - 01/02/18 0001      Lumbar Exercises: Stretches   Passive Hamstring Stretch  3 reps;30 seconds    Double Knee to Chest Stretch  10 seconds 5 reps, legs on  ball    Lower Trunk Rotation  -- 10 X 5 second, legs on red ball      Lumbar Exercises: Aerobic   Nustep  L4 x 6 min LE / UE      Lumbar Exercises: Machines for Strengthening   Other Lumbar Machine Exercise  cable cross row 10 x   moderate cues for posture    Other Lumbar Machine Exercise  lat pull down  mod cues for posture 10 X heavy cues for posture  Lumbar Exercises: Standing   Row  10 reps green HEP after instruction      Lumbar Exercises: Supine   Bent Knee Raise  10 reps lifting legs off ball    Bridge  10 reps legs on ball small lifts  no pain    Large Ball Abdominal Isometric  10 reps    Large Ball Oblique Isometric  10 reps left POP with out pain noted  anterior left thigh  LT hand      Knee/Hip Exercises: Standing   Forward Step Up  10 reps;Hand Hold: 2;Step Height: 4"    Functional Squat  10 reps moderate cues initially,  2 sets             PT Education - 01/02/18 1325    Education Details  HEP    Person(s) Educated  Patient    Methods  Explanation;Demonstration;Tactile cues;Verbal cues;Handout    Comprehension  Verbalized understanding;Returned demonstration       PT Short Term Goals - 12/28/17 1554      PT SHORT TERM GOAL #1   Title  pt to be I with initial HEP    Baseline  needs encouragement to be consistant and increase reps    Time  3    Period  Weeks     Status  Achieved    Target Date  01/11/18      PT SHORT TERM GOAL #2   Title  demonstrate proper posture in multiple positions and lifting mechancis to prevent/ reduce low back pain and referral symptoms    Time  3    Period  Weeks      PT SHORT TERM GOAL #3   Title  pt will reported >/= 4 days with no referred pain down the LLE to indicate improveing condition    Time  3    Period  Weeks    Status  Achieved        PT Long Term Goals - 01/02/18 1458      PT LONG TERM GOAL #1   Title  pt to increase trunk mobility by >/= 70 degrees flexion and >/= 20 degrees with ext/ bil side bending to promote functional trunk mobility for ADLs with </= 2/10 pain    Time  4    Period  Weeks    Status  Unable to assess      PT LONG TERM GOAL #2   Title  improve bil hip/ knee strength to >/= 4/5 in all planes to promote stability and facilitate form / posture and lifting activities    Time  4    Period  Weeks    Status  Unable to assess      PT LONG TERM GOAL #3   Title  pt to be able to sit/ stand/ walk for >/= 30 min with </= 2/10 pain for functoinal endurance required for community amb and ADLs    Baseline  able to do sitting 203 hours now,  able to walk as needed in grocery store.  Consistant??    Time  4    Period  Weeks    Status  Partially Met      PT LONG TERM GOAL #4   Title  pt to be I with all HEP given as of last visit to maintain/ progress current level of function    Time  4    Period  Weeks    Status  Unable to assess  Plan - 01/02/18 1455    Clinical Impression Statement  No pain today.  Patient attributes this to using a better way of getting in and out of the tub.  She was able to tolerate stabilization in standing without increase of pain.  LTG#2 partially met.   She requires moderate cues for posture/ technique in standing. She is interested in joining a GYM. No pain at end of session.  Progress toward pain goals.      PT Next Visit Plan  Try gym  equipment.  patient interested in gym upon discharge.  Work toward goals.     PT Home Exercise Plan  prone on elbows, piriformis stretching, posterior pelvic tilt, lower trunk rotation. posture handout, prone press-up, prone alternating UE/LE movement, bridges, standing extension    Consulted and Agree with Plan of Care  Patient       Patient will benefit from skilled therapeutic intervention in order to improve the following deficits and impairments:     Visit Diagnosis: Chronic bilateral low back pain with left-sided sciatica  Muscle weakness (generalized)  Abnormal posture  Other abnormalities of gait and mobility  Muscle spasm of back     Problem List Patient Active Problem List   Diagnosis Date Noted  . Acute left-sided low back pain with left-sided sciatica 11/27/2017  . Low back pain 10/30/2017  . Itching with irritation 06/23/2016  . Gastric reflux 05/04/2016  . Asthma 05/04/2016  . Essential hypertension 07/23/2015  . Mild depression ([place]) 07/23/2015  . Chest pain 06/06/2012  . Murmur, cardiac 06/06/2012    [facility] PTA 01/02/2018, 3:00 PM  [place], [place], [address] Phone: [phone]   Fax:  [phone]  Name: [name] MRN: [id number] Date of Birth: 06-Nov-1965

## 2018-01-04 ENCOUNTER — Ambulatory Visit: Payer: Medicaid Other | Admitting: Physical Therapy

## 2018-01-04 ENCOUNTER — Telehealth: Payer: Self-pay | Admitting: Family Medicine

## 2018-01-04 ENCOUNTER — Encounter: Payer: Self-pay | Admitting: Physical Therapy

## 2018-01-04 DIAGNOSIS — M62830 Muscle spasm of back: Secondary | ICD-10-CM

## 2018-01-04 DIAGNOSIS — G8929 Other chronic pain: Secondary | ICD-10-CM

## 2018-01-04 DIAGNOSIS — R2689 Other abnormalities of gait and mobility: Secondary | ICD-10-CM

## 2018-01-04 DIAGNOSIS — M5442 Lumbago with sciatica, left side: Principal | ICD-10-CM

## 2018-01-04 DIAGNOSIS — M6281 Muscle weakness (generalized): Secondary | ICD-10-CM

## 2018-01-04 DIAGNOSIS — R293 Abnormal posture: Secondary | ICD-10-CM

## 2018-01-04 NOTE — Therapy (Addendum)
[place], [place], [address] Phone: [phone]   Fax:  [phone]  Physical Therapy Treatment  Patient Details  Name: [name] MRN: [id number] Date of Birth: Sep 16, 1966 Referring Provider: Charlott Rakes, MD   Encounter Date: 01/04/2018  PT End of Session - 01/04/18 1504    Visit Number  9    Number of Visits  13    Date for PT Re-Evaluation 01/25/2018   PT Start Time  1173    PT Stop Time  1500    PT Time Calculation (min)  43 min    Activity Tolerance  Patient tolerated treatment well    Behavior During Therapy  [facility] for tasks assessed/performed       Past Medical History:  Diagnosis Date  . Asthma   . Gastric reflux   . History of blood transfusion yrs agio, none recent  . Hypertension   . Osteoarthritis   . Sleep apnea    cpap machine has not used since 2014 due to machine/tubing in bad shape   . Syncope and collapse yrs ago    Past Surgical History:  Procedure Laterality Date  . CARDIAC CATHETERIZATION     more than 5 years ago  . CARDIAC CATHETERIZATION  06-07-2012   [facility]: Normal coronary arteries. No significant LVOT/aortic valve gradient  . CHOLECYSTECTOMY    . COLONOSCOPY WITH PROPOFOL N/A 02/27/2014   Procedure: COLONOSCOPY WITH PROPOFOL;  Surgeon: Garlan Fair, MD;  [facility]: [facility];  Service: Endoscopy;  Laterality: N/A;  . ESOPHAGOGASTRODUODENOSCOPY (EGD) WITH PROPOFOL N/A 02/27/2014   Procedure: ESOPHAGOGASTRODUODENOSCOPY (EGD) WITH PROPOFOL;  Surgeon: Garlan Fair, MD;  [facility]: [facility];  Service: Endoscopy;  Laterality: N/A;  . EXPLORATORY LAPAROTOMY  09/2001   ELAP, LOA, R partial oophorectomy, repair of bowel injury  . VAGINAL HYSTERECTOMY  2001   Adnexa left in place    There were no vitals filed for this visit.  Subjective Assessment - 01/04/18 1436    Subjective    You won't believe this , but I don't have any pain.   I used good posture all  day yesterday.    Currently in Pain?  No/denies    Pain Location  Back    Pain Relieving Factors  using good posture                No data recorded       [facility] Adult PT Treatment/Exercise - 01/04/18 0001      Lumbar Exercises: Machines for Strengthening   Leg Press  1, 1.5. 2 plates 10 x each no pain    Other Lumbar Machine Exercise  lat pull down  mod cues for posture 10 X min cues for posture  15 LBS      Lumbar Exercises: Standing   Other Standing Lumbar Exercises  biceps on cable cross 10 X 2,  som soreness back  cued neutral spine    Other Standing Lumbar Exercises  Palof press 7 LBS heavy cues      Lumbar Exercises: Supine   Dead Bug  10 reps    Bridge  10 reps legs on ball small lifts  no pain      Lumbar Exercises: Sidelying   Clam  10 reps cues    Hip Abduction  10 reps      Shoulder Exercises: ROM/Strengthening   Lat Pull  20 reps    Lat Pull Limitations  15 LBS,  cues  Cybex Row  10 reps    Cybex Row Limitations  15 x 3 sets 20 x 1 set      Moist Heat Therapy   Moist Heat Location  -- during supine ex,  brief             PT Education - 01/04/18 1500    Education provided  Yes    Education Details  neutral with machines       PT Short Term Goals - 12/28/17 1554      PT SHORT TERM GOAL #1   Title  pt to be I with initial HEP    Baseline  needs encouragement to be consistant and increase reps    Time  3    Period  Weeks    Status  Achieved    Target Date  01/11/18      PT SHORT TERM GOAL #2   Title  demonstrate proper posture in multiple positions and lifting mechancis to prevent/ reduce low back pain and referral symptoms    Time  3    Period  Weeks      PT SHORT TERM GOAL #3   Title  pt will reported >/= 4 days with no referred pain down the LLE to indicate improveing condition    Time  3    Period  Weeks    Status  Achieved        PT Long Term Goals - 01/02/18 1458      PT LONG TERM GOAL #1   Title  pt to increase  trunk mobility by >/= 70 degrees flexion and >/= 20 degrees with ext/ bil side bending to promote functional trunk mobility for ADLs with </= 2/10 pain    Time  4    Period  Weeks    Status  Unable to assess      PT LONG TERM GOAL #2   Title  improve bil hip/ knee strength to >/= 4/5 in all planes to promote stability and facilitate form / posture and lifting activities    Time  4    Period  Weeks    Status  Unable to assess      PT LONG TERM GOAL #3   Title  pt to be able to sit/ stand/ walk for >/= 30 min with </= 2/10 pain for functoinal endurance required for community amb and ADLs    Baseline  able to do sitting 203 hours now,  able to walk as needed in grocery store.  Consistant??    Time  4    Period  Weeks    Status  Partially Met      PT LONG TERM GOAL #4   Title  pt to be I with all HEP given as of last visit to maintain/ progress current level of function    Time  4    Period  Weeks    Status  Unable to assess            Plan - 01/04/18 1505    Clinical Impression Statement  No pain at end of session.  She did have discomfort in low back with cable cross machine moist heat with supine decreased pain.  She is learning how to progress to a gym with mod cues for posture.     PT Next Visit Plan  continuegym equipment.  She wants to try the knee machinepatient interested in gym upon discharge.  Work toward goals.  PT Home Exercise Plan  prone on elbows, piriformis stretching, posterior pelvic tilt, lower trunk rotation. posture handout, prone press-up, prone alternating UE/LE movement, bridges, standing extension    Consulted and Agree with Plan of Care  Patient       Patient will benefit from skilled therapeutic intervention in order to improve the following deficits and impairments:     Visit Diagnosis: Chronic bilateral low back pain with left-sided sciatica  Muscle weakness (generalized)  Abnormal posture  Other abnormalities of gait and mobility  Muscle  spasm of back     Problem List Patient Active Problem List   Diagnosis Date Noted  . Acute left-sided low back pain with left-sided sciatica 11/27/2017  . Low back pain 10/30/2017  . Itching with irritation 06/23/2016  . Gastric reflux 05/04/2016  . Asthma 05/04/2016  . Essential hypertension 07/23/2015  . Mild depression ([place]) 07/23/2015  . Chest pain 06/06/2012  . Murmur, cardiac 06/06/2012    [facility]  PTA 01/04/2018, 3:07 PM  [facility] [address] [place], [place], [address] Phone: [phone]   Fax:  [phone]  Name: [name] MRN: [id number] Date of Birth: 06-09-1966

## 2018-01-04 NOTE — Telephone Encounter (Signed)
Call placed to patient #[phone], regarding her SCAT application and if she was approved for service. No answer. Left a message asking patient to return my call at [phone].

## 2018-01-09 ENCOUNTER — Ambulatory Visit: Payer: Medicaid Other | Admitting: Physical Therapy

## 2018-01-09 ENCOUNTER — Ambulatory Visit: Payer: Medicaid Other | Attending: Family Medicine | Admitting: Physical Therapy

## 2018-01-09 ENCOUNTER — Encounter: Payer: Self-pay | Admitting: Physical Therapy

## 2018-01-09 DIAGNOSIS — R293 Abnormal posture: Secondary | ICD-10-CM | POA: Insufficient documentation

## 2018-01-09 DIAGNOSIS — M6281 Muscle weakness (generalized): Secondary | ICD-10-CM | POA: Diagnosis present

## 2018-01-09 DIAGNOSIS — G8929 Other chronic pain: Secondary | ICD-10-CM | POA: Insufficient documentation

## 2018-01-09 DIAGNOSIS — M5442 Lumbago with sciatica, left side: Secondary | ICD-10-CM | POA: Insufficient documentation

## 2018-01-09 DIAGNOSIS — M62830 Muscle spasm of back: Secondary | ICD-10-CM | POA: Insufficient documentation

## 2018-01-09 DIAGNOSIS — R2689 Other abnormalities of gait and mobility: Secondary | ICD-10-CM | POA: Diagnosis present

## 2018-01-09 NOTE — Therapy (Addendum)
[facility] [address] [place], [place], [address] Phone: [phone]   Fax:  [phone]  Physical Therapy Treatment  Patient Details  Name: [name] MRN: [id number] Date of Birth: 04/26/1966 Referring Provider: Newlin, Enobong, MD   Encounter Date: 01/09/2018  PT End of Session - 01/09/18 1459    Visit Number  10    Number of Visits  13    Date for PT Re-Evaluation  01/25/2018   Authorization Type  MCD    PT Start Time  1415    PT Stop Time  1455    PT Time Calculation (min)  40 min    Activity Tolerance  Patient tolerated treatment well    Behavior During Therapy  [facility] for tasks assessed/performed       Past Medical History:  Diagnosis Date  . Asthma   . Gastric reflux   . History of blood transfusion yrs agio, none recent  . Hypertension   . Osteoarthritis   . Sleep apnea    cpap machine has not used since 2014 due to machine/tubing in bad shape   . Syncope and collapse yrs ago    Past Surgical History:  Procedure Laterality Date  . CARDIAC CATHETERIZATION     more than 5 years ago  . CARDIAC CATHETERIZATION  06-07-2012   [facility]: Normal coronary arteries. No significant LVOT/aortic valve gradient  . CHOLECYSTECTOMY    . COLONOSCOPY WITH PROPOFOL N/A 02/27/2014   Procedure: COLONOSCOPY WITH PROPOFOL;  Surgeon: Martin K Johnson, MD;  Location: [facility];  Service: Endoscopy;  Laterality: N/A;  . ESOPHAGOGASTRODUODENOSCOPY (EGD) WITH PROPOFOL N/A 02/27/2014   Procedure: ESOPHAGOGASTRODUODENOSCOPY (EGD) WITH PROPOFOL;  Surgeon: Martin K Johnson, MD;  Location: [facility];  Service: Endoscopy;  Laterality: N/A;  . EXPLORATORY LAPAROTOMY  09/2001   ELAP, LOA, R partial oophorectomy, repair of bowel injury  . VAGINAL HYSTERECTOMY  2001   Adnexa left in place    There were no vitals filed for this visit.  Subjective Assessment - 01/09/18 1419    Subjective  I feel really good today, just stiff from the cold      Patient Stated Goals  get the pain to feel better, return to walking    Currently in Pain?  No/denies    Pain Score  0-No pain                       [facility] Adult PT Treatment/Exercise - 01/09/18 0001      Lumbar Exercises: Standing   Row  10 reps 3# on cablecross     Other Standing Lumbar Exercises  lat pulls on cable cross 3L 2x10; chest presses on cable cross 2x10 (1 with 7#, 1 with 3#); lunges on 8 inch box 2x10 B     Other Standing Lumbar Exercises  UE press downs from overhead on cable cross 2x10 plate 3L; 2x10 sit to stand with 5# kettlebell; overhead press with 5# 1x5 B         Lumbar Exercises: Seated   Other Seated Lumbar Exercises  seated core twists with 5# kettle bell held close to body, cues       Lumbar Exercises: Supine   Ab Set  10 reps    Dead Bug  10 reps cues for form     Bridge  15 reps;10 reps 1x`15 no resistance, 1x10 red band resistance       Lumbar Exercises: Sidelying   Hip Abduction    10 reps      Knee/Hip Exercises: Standing   Other Standing Knee Exercises  deadlifts with close guarding and cuing ofr form 1x5 B 5# kettlebell              PT Education - 01/09/18 1459    Education provided  Yes    Education Details  postural training to neutral with machines, form for new exercises, importance of recovery periods with resistance training     Person(s) Educated  Patient    Methods  Explanation;Demonstration    Comprehension  Verbalized understanding;Need further instruction       PT Short Term Goals - 12/28/17 1554      PT SHORT TERM GOAL #1   Title  pt to be I with initial HEP    Baseline  needs encouragement to be consistant and increase reps    Time  3    Period  Weeks    Status  Achieved    Target Date  01/11/18      PT SHORT TERM GOAL #2   Title  demonstrate proper posture in multiple positions and lifting mechancis to prevent/ reduce low back pain and referral symptoms    Time  3    Period  Weeks      PT SHORT  TERM GOAL #3   Title  pt will reported >/= 4 days with no referred pain down the LLE to indicate improveing condition    Time  3    Period  Weeks    Status  Achieved        PT Long Term Goals - 01/02/18 1458      PT LONG TERM GOAL #1   Title  pt to increase trunk mobility by >/= 70 degrees flexion and >/= 20 degrees with ext/ bil side bending to promote functional trunk mobility for ADLs with </= 2/10 pain    Time  4    Period  Weeks    Status  Unable to assess      PT LONG TERM GOAL #2   Title  improve bil hip/ knee strength to >/= 4/5 in all planes to promote stability and facilitate form / posture and lifting activities    Time  4    Period  Weeks    Status  Unable to assess      PT LONG TERM GOAL #3   Title  pt to be able to sit/ stand/ walk for >/= 30 min with </= 2/10 pain for functoinal endurance required for community amb and ADLs    Baseline  able to do sitting 203 hours now,  able to walk as needed in grocery store.  Consistant??    Time  4    Period  Weeks    Status  Partially Met      PT LONG TERM GOAL #4   Title  pt to be I with all HEP given as of last visit to maintain/ progress current level of function    Time  4    Period  Weeks    Status  Unable to assess            Plan - 01/09/18 1500    Clinical Impression Statement  Continued performing exercises on gym equipment today, patient requiring form for introduction of new exercises and for exercise form this session. Required multiple breaks today due to fatigue and muscle soreness with new exercises. Also incorporated/introduced kettlebell and body weight exercises adjusted as appropriate   for patient strength and tolerance today, otherwise continued to work on core and proximal strengthening. No pain during or at end of session.     Rehab Potential  Good    PT Frequency  2x / week    PT Duration  4 weeks    PT Treatment/Interventions  ADLs/Self Care Home Management;Cryotherapy;Electrical  Stimulation;Iontophoresis 4mg/ml Dexamethasone;Moist Heat;Traction;Ultrasound;Balance training;Stair training;Therapeutic activities;Therapeutic exercise;Manual techniques;Taping;Dry needling;Patient/family education    PT Next Visit Plan  continuegym equipment.  She wants to try the knee machinepatient interested in gym upon discharge.  Work toward goals.     PT Home Exercise Plan  prone on elbows, piriformis stretching, posterior pelvic tilt, lower trunk rotation. posture handout, prone press-up, prone alternating UE/LE movement, bridges, standing extension    Consulted and Agree with Plan of Care  Patient       Patient will benefit from skilled therapeutic intervention in order to improve the following deficits and impairments:  Pain, Abnormal gait, Decreased activity tolerance, Postural dysfunction, Improper body mechanics, Decreased range of motion, Decreased strength, Increased muscle spasms, Difficulty walking, Decreased endurance  Visit Diagnosis: Chronic bilateral low back pain with left-sided sciatica  Muscle weakness (generalized)  Abnormal posture  Other abnormalities of gait and mobility  Muscle spasm of back     Problem List Patient Active Problem List   Diagnosis Date Noted  . Acute left-sided low back pain with left-sided sciatica 11/27/2017  . Low back pain 10/30/2017  . Itching with irritation 06/23/2016  . Gastric reflux 05/04/2016  . Asthma 05/04/2016  . Essential hypertension 07/23/2015  . Mild depression ([facility]) 07/23/2015  . Chest pain 06/06/2012  . Murmur, cardiac 06/06/2012    Kristen Unger PT, DPT, CBIS  Supplemental Physical Therapist [place]   Pager [phone]   [facility] [address] [place], [place], [address] Phone: [phone]   Fax:  [phone]  Name: [name] MRN: [id number] Date of Birth: 03/30/1966   

## 2018-01-11 ENCOUNTER — Encounter: Payer: Self-pay | Admitting: Physical Therapy

## 2018-01-11 ENCOUNTER — Ambulatory Visit: Payer: Medicaid Other | Admitting: Physical Therapy

## 2018-01-11 DIAGNOSIS — M5442 Lumbago with sciatica, left side: Secondary | ICD-10-CM | POA: Diagnosis not present

## 2018-01-11 DIAGNOSIS — R293 Abnormal posture: Secondary | ICD-10-CM

## 2018-01-11 DIAGNOSIS — R2689 Other abnormalities of gait and mobility: Secondary | ICD-10-CM

## 2018-01-11 DIAGNOSIS — M6281 Muscle weakness (generalized): Secondary | ICD-10-CM

## 2018-01-11 DIAGNOSIS — G8929 Other chronic pain: Secondary | ICD-10-CM

## 2018-01-11 DIAGNOSIS — M62830 Muscle spasm of back: Secondary | ICD-10-CM

## 2018-01-11 NOTE — Therapy (Addendum)
[place], [place], [address] Phone: [phone]   Fax:  [phone]  Physical Therapy Treatment / Discharge Summary  Patient Details  Name: [name] MRN: [id number] Date of Birth: 15-Jan-1966 Referring Provider: Charlott Rakes, MD   Encounter Date: 01/11/2018  PT End of Session - 01/11/18 1415    Visit Number  11    Number of Visits  13    Date for PT Re-Evaluation  01/25/18    PT Start Time  2671    PT Stop Time  1444 shortened visit due to discharge    PT Time Calculation (min)  29 min    Activity Tolerance  Patient tolerated treatment well    Behavior During Therapy  [facility] for tasks assessed/performed       Past Medical History:  Diagnosis Date  . Asthma   . Gastric reflux   . History of blood transfusion yrs agio, none recent  . Hypertension   . Osteoarthritis   . Sleep apnea    cpap machine has not used since 2014 due to machine/tubing in bad shape   . Syncope and collapse yrs ago    Past Surgical History:  Procedure Laterality Date  . CARDIAC CATHETERIZATION     more than 5 years ago  . CARDIAC CATHETERIZATION  06-07-2012   [facility]: Normal coronary arteries. No significant LVOT/aortic valve gradient  . CHOLECYSTECTOMY    . COLONOSCOPY WITH PROPOFOL N/A 02/27/2014   Procedure: COLONOSCOPY WITH PROPOFOL;  Surgeon: Garlan Fair, MD;  Location: [facility];  Service: Endoscopy;  Laterality: N/A;  . ESOPHAGOGASTRODUODENOSCOPY (EGD) WITH PROPOFOL N/A 02/27/2014   Procedure: ESOPHAGOGASTRODUODENOSCOPY (EGD) WITH PROPOFOL;  Surgeon: Garlan Fair, MD;  Location: [facility];  Service: Endoscopy;  Laterality: N/A;  . EXPLORATORY LAPAROTOMY  09/2001   ELAP, LOA, R partial oophorectomy, repair of bowel injury  . VAGINAL HYSTERECTOMY  2001   Adnexa left in place    There were no vitals filed for this visit.  Subjective Assessment - 01/11/18 1414    Subjective  "I am doing good, and  haven't had pain in a while but every now and again it will start in the calf but will go away"    Patient Stated Goals  get the pain to feel better, return to walking    Currently in Pain?  No/denies    Pain Score  0-No pain    Aggravating Factors   N/A    Pain Relieving Factors  exercise         [facility] PT Assessment - 01/11/18 1420      AROM   Lumbar Flexion  80 initally 50    Lumbar Extension  28 initially 28 degrees    Lumbar - Right Side Bend  20 initially 15 degrees    Lumbar - Left Side Bend  18 initally 8/10      Strength   Right Hip Flexion  4+/5 initally 3+/5    Right Hip Extension  4-/5 initially 3/.5    Right Hip ABduction  4/5 initally 3+/5    Left Hip Flexion  4+/5    Left Hip Extension  4-/5 initally 3/5    Left Hip ABduction  4/5 initally 3+/.5    Right Knee Flexion  4+/5    Right Knee Extension  4+/5    Left Knee Flexion  4/5 soreness during testing    Left Knee Extension  4+/5  [facility] PT Treatment/Exercise - 01/11/18 0001      Lumbar Exercises: Aerobic   Nustep  L7 x 5 min UE/LE      Lumbar Exercises: Machines for Strengthening   Leg Press  2 x 10, 1 set with 40#, 1 set with 50#             PT Education - 01/11/18 1443    Education provided  Yes    Education Details  reviewed previously provided HEP and benefits of continued strengthening with increased reps/ sets to promote endurnace and maintain level of function    Person(s) Educated  Patient    Methods  Explanation;Verbal cues    Comprehension  Verbalized understanding;Verbal cues required       PT Short Term Goals - 12/28/17 1554      PT SHORT TERM GOAL #1   Title  pt to be I with initial HEP    Baseline  needs encouragement to be consistant and increase reps    Time  3    Period  Weeks    Status  Achieved    Target Date  01/11/18      PT SHORT TERM GOAL #2   Title  demonstrate proper posture in multiple positions and lifting mechancis to  prevent/ reduce low back pain and referral symptoms    Time  3    Period  Weeks      PT SHORT TERM GOAL #3   Title  pt will reported >/= 4 days with no referred pain down the LLE to indicate improveing condition    Time  3    Period  Weeks    Status  Achieved        PT Long Term Goals - 01/11/18 1426      PT LONG TERM GOAL #1   Title  pt to increase trunk mobility by >/= 70 degrees flexion and >/= 20 degrees with ext/ bil side bending to promote functional trunk mobility for ADLs with </= 2/10 pain    Time  4    Period  Weeks    Status  Achieved      PT LONG TERM GOAL #2   Status  Partially Met      PT LONG TERM GOAL #3   Title  pt to be able to sit/ stand/ walk for >/= 30 min with </= 2/10 pain for functoinal endurance required for community amb and ADLs    Time  4    Status  Achieved      PT LONG TERM GOAL #4   Title  pt to be I with all HEP given as of last visit to maintain/ progress current level of function    Time  4    Period  Weeks    Status  Achieved            Plan - 01/11/18 1431    Clinical Impression Statement  pt has made great physical therapy increasing trunk mobility and hip strength, additionally she reports no pain. reviewed previously provided HEP which she is independent with. she met or partially met all goals today. Pt is able to maintain and progress her level of function indepedently and will be discharged from PT today.     PT Next Visit Plan  d/C    PT Home Exercise Plan  prone on elbows, piriformis stretching, posterior pelvic tilt, lower trunk rotation. posture handout, prone press-up, prone alternating UE/LE movement, bridges, standing extension  Consulted and Agree with Plan of Care  Patient       Patient will benefit from skilled therapeutic intervention in order to improve the following deficits and impairments:  Pain, Abnormal gait, Decreased activity tolerance, Postural dysfunction, Improper body mechanics, Decreased range of  motion, Decreased strength, Increased muscle spasms, Difficulty walking, Decreased endurance  Visit Diagnosis: Chronic bilateral low back pain with left-sided sciatica  Muscle weakness (generalized)  Abnormal posture  Other abnormalities of gait and mobility  Muscle spasm of back     Problem List Patient Active Problem List   Diagnosis Date Noted  . Acute left-sided low back pain with left-sided sciatica 11/27/2017  . Low back pain 10/30/2017  . Itching with irritation 06/23/2016  . Gastric reflux 05/04/2016  . Asthma 05/04/2016  . Essential hypertension 07/23/2015  . Mild depression (HCC) 07/23/2015  . Chest pain 06/06/2012  . Murmur, cardiac 06/06/2012   Kristoffer Leamon PT, DPT, LAT, ATC  01/11/18  2:46 PM      [facility] [address] [place], [place], [address] Phone: [phone]   Fax:  [phone]  Name: Kandas Galligan MRN: [id number] Date of Birth: 10/29/1965       PHYSICAL THERAPY DISCHARGE SUMMARY  Visits from Start of Care: 11  Current functional level related to goals / functional outcomes: See goals   Remaining deficits: Occasional soreness in the L calf which she reports is fleeting with exercise. See above assessment   Education / Equipment: HEP, theraband, posture, lifting mechanics.   Plan: Patient agrees to discharge.  Patient goals were met. Patient is being discharged due to meeting the stated rehab goals.  ?????          Kristoffer Leamon PT, DPT, LAT, ATC  01/11/18  2:47 PM      

## 2018-01-29 ENCOUNTER — Ambulatory Visit: Payer: Medicaid Other | Attending: Family Medicine | Admitting: Family Medicine

## 2018-01-29 ENCOUNTER — Encounter: Payer: Self-pay | Admitting: Family Medicine

## 2018-01-29 VITALS — BP 136/89 | HR 63 | Temp 98.2°F | Ht 64.0 in | Wt 235.0 lb

## 2018-01-29 DIAGNOSIS — Z90710 Acquired absence of both cervix and uterus: Secondary | ICD-10-CM | POA: Insufficient documentation

## 2018-01-29 DIAGNOSIS — J329 Chronic sinusitis, unspecified: Secondary | ICD-10-CM | POA: Diagnosis not present

## 2018-01-29 DIAGNOSIS — Z9889 Other specified postprocedural states: Secondary | ICD-10-CM | POA: Diagnosis not present

## 2018-01-29 DIAGNOSIS — J4530 Mild persistent asthma, uncomplicated: Secondary | ICD-10-CM | POA: Diagnosis not present

## 2018-01-29 DIAGNOSIS — E669 Obesity, unspecified: Secondary | ICD-10-CM | POA: Insufficient documentation

## 2018-01-29 DIAGNOSIS — Z6841 Body Mass Index (BMI) 40.0 and over, adult: Secondary | ICD-10-CM | POA: Insufficient documentation

## 2018-01-29 DIAGNOSIS — Z9049 Acquired absence of other specified parts of digestive tract: Secondary | ICD-10-CM | POA: Diagnosis not present

## 2018-01-29 DIAGNOSIS — J328 Other chronic sinusitis: Secondary | ICD-10-CM | POA: Diagnosis not present

## 2018-01-29 DIAGNOSIS — E66813 Obesity, class 3: Secondary | ICD-10-CM

## 2018-01-29 DIAGNOSIS — I10 Essential (primary) hypertension: Secondary | ICD-10-CM | POA: Diagnosis not present

## 2018-01-29 DIAGNOSIS — Z79899 Other long term (current) drug therapy: Secondary | ICD-10-CM | POA: Insufficient documentation

## 2018-01-29 DIAGNOSIS — K219 Gastro-esophageal reflux disease without esophagitis: Secondary | ICD-10-CM | POA: Diagnosis not present

## 2018-01-29 MED ORDER — CETIRIZINE HCL 10 MG PO TABS: 10.0000 mg | ORAL_TABLET | Freq: Every day | ORAL | 2 refills | Status: DC

## 2018-01-29 MED ORDER — OMEPRAZOLE 20 MG PO CPDR: 40.0000 mg | DELAYED_RELEASE_CAPSULE | Freq: Every day | ORAL | 6 refills | Status: DC

## 2018-01-29 MED ORDER — BUDESONIDE-FORMOTEROL FUMARATE 160-4.5 MCG/ACT IN AERO: 2.0000 | INHALATION_SPRAY | Freq: Two times a day (BID) | RESPIRATORY_TRACT | 6 refills | Status: DC

## 2018-01-29 MED ORDER — ALBUTEROL SULFATE HFA 108 (90 BASE) MCG/ACT IN AERS: 2.0000 | INHALATION_SPRAY | Freq: Two times a day (BID) | RESPIRATORY_TRACT | 6 refills | Status: DC

## 2018-01-29 MED ORDER — METOPROLOL SUCCINATE ER 25 MG PO TB24: 12.5000 mg | ORAL_TABLET | Freq: Every day | ORAL | 1 refills | Status: DC

## 2018-01-29 NOTE — Patient Instructions (Signed)

## 2018-01-29 NOTE — Progress Notes (Signed)
Subjective:  Patient ID: [name], female    DOB: Aug 17, 1966  Age: 52 y.o. MRN: [id number]  CC: Hypertension   HPI [name] is a 52 year old female with a history of hypertension who comes in for a follow up of Hypertension, Asthma, GERD and low back pain. She reports resolution of her low back pain ever since she received an L4,5 translaminar injection by Dr Ernestina Patches and she has completed a course of PT. Last visit with Orthopedics, Dr Ninfa Linden was in 12/2017.  Her hypertension is controlled on her current regimen and she tolerates her medications with no adverse effects.She does not exercise regularly but adheres to a low sodium diet. She denies recent asthma exacerbation and is not having to use her MDI more than usual. Reflux symptoms are stable and she denies nausea, vomiting, abdominal pain. She has no additional concerns today.  Past Medical History:  Diagnosis Date  . Asthma   . Gastric reflux   . History of blood transfusion yrs agio, none recent  . Hypertension   . Osteoarthritis   . Sleep apnea    cpap machine has not used since 2014 due to machine/tubing in bad shape   . Syncope and collapse yrs ago    Past Surgical History:  Procedure Laterality Date  . CARDIAC CATHETERIZATION     more than 5 years ago  . CARDIAC CATHETERIZATION  06-07-2012   [facility]: Normal coronary arteries. No significant LVOT/aortic valve gradient  . CHOLECYSTECTOMY    . COLONOSCOPY WITH PROPOFOL N/A 02/27/2014   Procedure: COLONOSCOPY WITH PROPOFOL;  Surgeon: Garlan Fair, MD;  Location: [facility];  Service: Endoscopy;  Laterality: N/A;  . ESOPHAGOGASTRODUODENOSCOPY (EGD) WITH PROPOFOL N/A 02/27/2014   Procedure: ESOPHAGOGASTRODUODENOSCOPY (EGD) WITH PROPOFOL;  Surgeon: Garlan Fair, MD;  Location: [facility];  Service: Endoscopy;  Laterality: N/A;  . EXPLORATORY LAPAROTOMY  09/2001   ELAP, LOA, R partial oophorectomy, repair of bowel injury  . VAGINAL HYSTERECTOMY  2001   Adnexa left in place    Allergies  Allergen Reactions  . Tramadol Hives, Itching and Swelling    Pt states after medication intake she had to go to the ER.      Outpatient Medications Prior to Visit  Medication Sig Dispense Refill  . cyclobenzaprine (FLEXERIL) 10 MG tablet Take 1 tablet (10 mg total) by mouth 2 (two) times daily as needed for muscle spasms. 60 tablet 3  . hydrocortisone cream 1 % Apply to affected area 2 times daily 15 g 0  . naproxen (NAPROSYN) 500 MG tablet Take 1 tablet (500 mg total) by mouth 2 (two) times daily with a meal. 60 tablet 3  . acetaminophen-codeine (TYLENOL #3) 300-30 MG tablet Take 1 tablet by mouth every 12 (twelve) hours as needed for moderate pain. 60 tablet 1  . albuterol (PROVENTIL HFA;VENTOLIN HFA) 108 (90 Base) MCG/ACT inhaler Inhale 2 puffs into the lungs 2 (two) times daily. 1 Inhaler 3  . benzonatate (TESSALON) 100 MG capsule Take 1 capsule (100 mg total) by mouth 3 (three) times daily as needed for cough. 21 capsule 0  . budesonide-formoterol (SYMBICORT) 160-4.5 MCG/ACT inhaler Inhale 2 puffs into the lungs 2 (two) times daily. 1 Inhaler 3  . cetirizine (ZYRTEC) 10 MG tablet Take 1 tablet (10 mg total) by mouth daily. 30 tablet 2  . metoprolol succinate (TOPROL-XL) 25 MG 24 hr tablet Take 0.5 tablets (12.5 mg total) by mouth daily. 45 tablet 1  . diazepam (VALIUM) 5 MG tablet  Take 1 by mouth 1 to 2 hours pre-procedure. May repeat if necessary. (Patient not taking: Reported on 01/29/2018) 2 tablet 0  . omeprazole (PRILOSEC) 20 MG capsule Take 1 capsule (20 mg total) by mouth daily. (Patient taking differently: Take 40 mg by mouth daily. ) 30 capsule 3  . traMADol (ULTRAM) 50 MG tablet Take 2 tablets (100 mg total) by mouth every 6 (six) hours as needed. (Patient not taking: Reported on 12/12/2017) 60 tablet 0   No facility-administered medications prior to visit.     ROS Review of Systems  Constitutional: Negative for activity change, appetite  change and fatigue.  HENT: Negative for congestion, sinus pressure and sore throat.   Eyes: Negative for visual disturbance.  Respiratory: Negative for cough, chest tightness, shortness of breath and wheezing.   Cardiovascular: Negative for chest pain and palpitations.  Gastrointestinal: Negative for abdominal distention, abdominal pain and constipation.  Endocrine: Negative for polydipsia.  Genitourinary: Negative for dysuria and frequency.  Musculoskeletal: Negative for arthralgias and back pain.  Skin: Negative for rash.  Neurological: Negative for tremors, light-headedness and numbness.  Hematological: Does not bruise/bleed easily.  Psychiatric/Behavioral: Negative for agitation and behavioral problems.    Objective:  BP 136/89   Pulse 63   Temp 98.2 F (36.8 C) (Oral)   Ht '5\' 4"'$  (1.626 m)   Wt 235 lb (106.6 kg)   SpO2 100%   BMI 40.34 kg/m   BP/Weight 01/29/2018 10/11/9415 4/0/8144  Systolic BP 818 563 149  Diastolic BP 89 95 66  Wt. (Lbs) 235 - 220  BMI 40.34 - 37.76      Physical Exam  Constitutional: She is oriented to person, place, and time. She appears well-developed and well-nourished.  Cardiovascular: Normal rate and intact distal pulses.  Murmur (3/6 systolic) heard. Pulmonary/Chest: Effort normal and breath sounds normal. She has no wheezes. She has no rales. She exhibits no tenderness.  Abdominal: Soft. Bowel sounds are normal. She exhibits no distension and no mass. There is no tenderness.  Musculoskeletal: Normal range of motion. She exhibits no tenderness.  Negative straight leg raise b/l  Neurological: She is alert and oriented to person, place, and time.  Skin: Skin is warm and dry.     Assessment & Plan:   1. Mild persistent asthma without complication Controlled - budesonide-formoterol (SYMBICORT) 160-4.5 MCG/ACT inhaler; Inhale 2 puffs into the lungs 2 (two) times daily.  Dispense: 1 Inhaler; Refill: 6 - albuterol (PROVENTIL HFA;VENTOLIN HFA)  108 (90 Base) MCG/ACT inhaler; Inhale 2 puffs into the lungs 2 (two) times daily.  Dispense: 1 Inhaler; Refill: 6  2. Other chronic sinusitis Stable - cetirizine (ZYRTEC) 10 MG tablet; Take 1 tablet (10 mg total) by mouth daily.  Dispense: 30 tablet; Refill: 2  3. Gastroesophageal reflux disease, esophagitis presence not specified Controlled - omeprazole (PRILOSEC) 20 MG capsule; Take 2 capsules (40 mg total) by mouth daily.  Dispense: 30 capsule; Refill: 6  4. Essential hypertension Controlled Low sodium, DASH diet - metoprolol succinate (TOPROL-XL) 25 MG 24 hr tablet; Take 0.5 tablets (12.5 mg total) by mouth daily.  Dispense: 45 tablet; Refill: 1 - CMP14+EGFR - Lipid panel  5. Class 3 severe obesity due to excess calories without serious comorbidity with body mass index (BMI) of 40.0 to 44.9 in adult [facility]) Discussed reducing portion sizes, avoiding late meals 150 minutes of moderate intensity exercise/week   Meds ordered this encounter  Medications  . budesonide-formoterol (SYMBICORT) 160-4.5 MCG/ACT inhaler    Sig: Inhale  2 puffs into the lungs 2 (two) times daily.    Dispense:  1 Inhaler    Refill:  6  . cetirizine (ZYRTEC) 10 MG tablet    Sig: Take 1 tablet (10 mg total) by mouth daily.    Dispense:  30 tablet    Refill:  2  . metoprolol succinate (TOPROL-XL) 25 MG 24 hr tablet    Sig: Take 0.5 tablets (12.5 mg total) by mouth daily.    Dispense:  45 tablet    Refill:  1  . omeprazole (PRILOSEC) 20 MG capsule    Sig: Take 2 capsules (40 mg total) by mouth daily.    Dispense:  30 capsule    Refill:  6  . albuterol (PROVENTIL HFA;VENTOLIN HFA) 108 (90 Base) MCG/ACT inhaler    Sig: Inhale 2 puffs into the lungs 2 (two) times daily.    Dispense:  1 Inhaler    Refill:  6    Follow-up: Return in about 6 months (around 07/31/2018) for follow up of chronic medical conditions.   Charlott Rakes MD

## 2018-01-30 ENCOUNTER — Telehealth: Payer: Self-pay

## 2018-01-30 LAB — CMP14+EGFR
ALT: 52 IU/L — ABNORMAL HIGH (ref 0–32)
AST: 16 IU/L (ref 0–40)
Albumin/Globulin Ratio: 1.8 (ref 1.2–2.2)
Albumin: 4.1 g/dL (ref 3.5–5.5)
Alkaline Phosphatase: 103 IU/L (ref 39–117)
BUN/Creatinine Ratio: 27 — ABNORMAL HIGH (ref 9–23)
BUN: 14 mg/dL (ref 6–24)
Bilirubin Total: 0.5 mg/dL (ref 0.0–1.2)
CO2: 26 mmol/L (ref 20–29)
Calcium: 9.6 mg/dL (ref 8.7–10.2)
Chloride: 104 mmol/L (ref 96–106)
Creatinine, Ser: 0.52 mg/dL — ABNORMAL LOW (ref 0.57–1.00)
GFR calc Af Amer: 126 mL/min/{1.73_m2} (ref >59)
GFR calc non Af Amer: 109 mL/min/{1.73_m2} (ref >59)
Globulin, Total: 2.3 g/dL (ref 1.5–4.5)
Glucose: 73 mg/dL (ref 65–99)
Potassium: 3.7 mmol/L (ref 3.5–5.2)
Sodium: 144 mmol/L (ref 134–144)
Total Protein: 6.4 g/dL (ref 6.0–8.5)

## 2018-01-30 LAB — LIPID PANEL
Chol/HDL Ratio: 3.1 ratio (ref 0.0–4.4)
Cholesterol, Total: 173 mg/dL (ref 100–199)
HDL: 55 mg/dL
LDL Calculated: 104 mg/dL — ABNORMAL HIGH (ref 0–99)
Triglycerides: 70 mg/dL (ref 0–149)
VLDL Cholesterol Cal: 14 mg/dL (ref 5–40)

## 2018-01-30 NOTE — Telephone Encounter (Signed)
Patient was called and informed to contact office for lab results.   If patient returns phone call please inform patient of normal lab results.

## 2018-01-30 NOTE — Telephone Encounter (Signed)
Pt. Returned nurse call and was informed that her lab results came back normal. Pt. Had no questions.

## 2018-01-31 ENCOUNTER — Telehealth: Payer: Self-pay | Admitting: Family Medicine

## 2018-01-31 NOTE — Telephone Encounter (Signed)
Patient was called and informed to make an office visit to evaluate for a UTI.

## 2018-01-31 NOTE — Telephone Encounter (Signed)
Pt called to saying she started suffering back pain and a burning sensation when she pee's.since she was seen at the beginning of the week she just would like a call back with advice before scheduling an appt. Please follow up

## 2018-02-01 ENCOUNTER — Ambulatory Visit: Payer: Medicaid Other | Attending: Family Medicine | Admitting: *Deleted

## 2018-02-01 DIAGNOSIS — R300 Dysuria: Secondary | ICD-10-CM

## 2018-02-01 LAB — POCT URINALYSIS DIPSTICK
Appearance: NORMAL
Bilirubin, UA: NEGATIVE
Blood, UA: NEGATIVE
Glucose, UA: NEGATIVE
Ketones, UA: NEGATIVE
Leukocytes, UA: NEGATIVE
Nitrite, UA: NEGATIVE
Protein, UA: NEGATIVE
Spec Grav, UA: 1.01 (ref 1.010–1.025)
Urobilinogen, UA: 0.2 E.U./dL
pH, UA: 7 (ref 5.0–8.0)

## 2018-02-01 NOTE — Progress Notes (Signed)
Patient Triage Assessment Form Todays Date: 02/01/2018 Name: Jenna GipBetsy Morison DOB: [id number] Reason for walkin: Burning when urinating When did your symptoms start? 01/31/2018 Please list symptoms: Lower back pain, strong odor to urine and pain with urination. Are you having pain: yes: lower back  Urinalysis negative for UTI. Did not assess odor to urine. Urine appearance WNL. Send off for Urine culture.  No availability on provider schedule for the afternoon. Provider is unable to work patient in today. Advised patient to make appointment for tomorrow since urinalysis was negative.

## 2018-02-04 LAB — URINE CULTURE

## 2018-02-05 ENCOUNTER — Other Ambulatory Visit: Payer: Self-pay | Admitting: Family Medicine

## 2018-02-05 MED ORDER — CIPROFLOXACIN HCL 500 MG PO TABS
500.0000 mg | ORAL_TABLET | Freq: Two times a day (BID) | ORAL | 0 refills | Status: DC
Start: 2018-02-05 — End: 2018-02-28

## 2018-02-07 ENCOUNTER — Telehealth: Payer: Self-pay

## 2018-02-07 NOTE — Telephone Encounter (Signed)
Patient was called and a voicemail was left informing patient to return phone call for lab results. 

## 2018-02-09 NOTE — Telephone Encounter (Signed)
Patient was called and informed of lab results and medication being sent to pharmacy. 

## 2018-02-28 ENCOUNTER — Ambulatory Visit: Payer: Medicaid Other | Attending: Family Medicine | Admitting: Physician Assistant

## 2018-02-28 VITALS — BP 130/77 | HR 82 | Temp 98.5°F | Resp 16 | Wt 228.2 lb

## 2018-02-28 DIAGNOSIS — Z79899 Other long term (current) drug therapy: Secondary | ICD-10-CM | POA: Insufficient documentation

## 2018-02-28 DIAGNOSIS — J069 Acute upper respiratory infection, unspecified: Secondary | ICD-10-CM | POA: Diagnosis not present

## 2018-02-28 DIAGNOSIS — I10 Essential (primary) hypertension: Secondary | ICD-10-CM | POA: Diagnosis not present

## 2018-02-28 MED ORDER — AZITHROMYCIN 250 MG PO TABS: ORAL_TABLET | ORAL | 0 refills | Status: DC

## 2018-02-28 MED ORDER — BENZONATATE 100 MG PO CAPS: 200.0000 mg | ORAL_CAPSULE | Freq: Three times a day (TID) | ORAL | 0 refills | Status: DC | PRN

## 2018-02-28 MED ORDER — FLUCONAZOLE 150 MG PO TABS: 150.0000 mg | ORAL_TABLET | Freq: Once | ORAL | 0 refills | Status: AC

## 2018-02-28 NOTE — Progress Notes (Signed)
Patient ID: [name], female   DOB: 1966/08/28, 52 y.o.   MRN: [id number]      [name], is a 51 y.o. female  [id number]:[id number]  [id number]  DOB - May 23, 1966  Subjective:  Chief Complaint and HPI: [name] is a 52 y.o. female here today for URI s/sx.  She presented with Sinus pain and pressure then cough that all started about 2 weeks ago.  Sinus pressure has improved.  Mucus that she is coughing up is thick and green.  No fever.  She has coughed to the point that she has vomited at night, but denies feeling nauseated.    ROS:   Constitutional:  No f/c, No night sweats, No unexplained weight loss. EENT:  No vision changes, No blurry vision, No hearing changes. No additional mouth, throat, or ear problems.  Respiratory: + cough, No SOB Cardiac: No CP, no palpitations GI:  No abd pain, No N/V/D. GU: No Urinary s/sx Musculoskeletal: No joint pain Neuro: No headache, no dizziness, no motor weakness.  Skin: No rash Endocrine:  No polydipsia. No polyuria.  Psych: Denies SI/HI  No problems updated.  ALLERGIES: Allergies  Allergen Reactions  . Tramadol Hives, Itching and Swelling    Pt states after medication intake she had to go to the ER.     PAST MEDICAL HISTORY: Past Medical History:  Diagnosis Date  . Asthma   . Gastric reflux   . History of blood transfusion yrs agio, none recent  . Hypertension   . Osteoarthritis   . Sleep apnea    cpap machine has not used since 2014 due to machine/tubing in bad shape   . Syncope and collapse yrs ago    MEDICATIONS AT HOME: Prior to Admission medications   Medication Sig Start Date End Date Taking? Authorizing Provider  albuterol (PROVENTIL HFA;VENTOLIN HFA) 108 (90 Base) MCG/ACT inhaler Inhale 2 puffs into the lungs 2 (two) times daily. 01/29/18   Hoy Register, MD  azithromycin (ZITHROMAX) 250 MG tablet Take 2 today then 1 daily 02/28/18   Anders Simmonds, PA-C  benzonatate (TESSALON) 100 MG capsule Take 2  capsules (200 mg total) by mouth 3 (three) times daily as needed for cough. 02/28/18   Anders Simmonds, PA-C  budesonide-formoterol (SYMBICORT) 160-4.5 MCG/ACT inhaler Inhale 2 puffs into the lungs 2 (two) times daily. 01/29/18   Hoy Register, MD  cetirizine (ZYRTEC) 10 MG tablet Take 1 tablet (10 mg total) by mouth daily. 01/29/18   Hoy Register, MD  cyclobenzaprine (FLEXERIL) 10 MG tablet Take 1 tablet (10 mg total) by mouth 2 (two) times daily as needed for muscle spasms. 04/25/17   Hoy Register, MD  diazepam (VALIUM) 5 MG tablet Take 1 by mouth 1 to 2 hours pre-procedure. May repeat if necessary. Patient not taking: Reported on 01/29/2018 11/29/17   Tyrell Antonio, MD  fluconazole (DIFLUCAN) 150 MG tablet Take 1 tablet (150 mg total) by mouth once for 1 dose. If needed for yeast infection 02/28/18 02/28/18  Anders Simmonds, PA-C  hydrocortisone cream 1 % Apply to affected area 2 times daily 12/08/17   Couture, Cortni S, PA-C  metoprolol succinate (TOPROL-XL) 25 MG 24 hr tablet Take 0.5 tablets (12.5 mg total) by mouth daily. 01/29/18   Hoy Register, MD  naproxen (NAPROSYN) 500 MG tablet Take 1 tablet (500 mg total) by mouth 2 (two) times daily with a meal. 08/29/17   Hoy Register, MD  omeprazole (PRILOSEC) 20 MG capsule Take 2 capsules (40 mg  total) by mouth daily. 01/29/18 02/28/18  Hoy Register, MD     Objective:  EXAM:   Vitals:   02/28/18 1433  BP: 130/77  Pulse: 82  Resp: 16  Temp: 98.5 F (36.9 C)  TempSrc: Oral  SpO2: 95%  Weight: 228 lb 3.2 oz (103.5 kg)    General appearance : A&OX3. NAD. Non-toxic-appearing HEENT: Atraumatic and Normocephalic.  PERRLA. EOM intact.  TM full B. Mouth-MMM, post pharynx WNL w/ mild erythema, + PND. Neck: supple, no JVD. No cervical lymphadenopathy. No thyromegaly Chest/Lungs:  Breathing-non-labored, Good air entry bilaterally, breath sounds normal without rales, rhonchi, or wheezing  CVS: S1 S2 regular, no murmurs, gallops, rubs   Extremities: Bilateral Lower Ext shows no edema, both legs are warm to touch with = pulse throughout Neurology:  CN II-XII grossly intact, Non focal.   Psych:  TP linear. J/I WNL. Normal speech. Appropriate eye contact and affect.  Skin:  No Rash  Data Review Lab Results  Component Value Date   HGBA1C 5.4 05/29/2017   HGBA1C 5.5 12/10/2013     Assessment & Plan   1. Upper respiratory tract infection, unspecified type Will cover for atypicals due to length of illness - azithromycin (ZITHROMAX) 250 MG tablet; Take 2 today then 1 daily  Dispense: 6 tablet; Refill: 0 - fluconazole (DIFLUCAN) 150 MG tablet; Take 1 tablet (150 mg total) by mouth once for 1 dose. If needed for yeast infection  Dispense: 1 tablet; Refill: 0 - benzonatate (TESSALON) 100 MG capsule; Take 2 capsules (200 mg total) by mouth 3 (three) times daily as needed for cough.  Dispense: 40 capsule; Refill: 0  2. Essential hypertension Controlled-continue current regimen     Patient have been counseled extensively about nutrition and exercise  Return if symptoms worsen or fail to improve.  The patient was given clear instructions to go to ER or return to medical center if symptoms don't improve, worsen or new problems develop. The patient verbalized understanding. The patient was told to call to get lab results if they haven't heard anything in the next week.     Georgian Co, PA-C [facility] [place], [place] [phone]   02/28/2018, 2:59 PM

## 2018-06-14 ENCOUNTER — Emergency Department (HOSPITAL_COMMUNITY)
Admission: EM | Admit: 2018-06-14 | Discharge: 2018-06-14 | Disposition: A | Discharge status: Home / Self Care | Payer: Medicaid Other | Attending: Emergency Medicine | Admitting: Emergency Medicine

## 2018-06-14 ENCOUNTER — Emergency Department (HOSPITAL_COMMUNITY): Payer: Medicaid Other

## 2018-06-14 ENCOUNTER — Encounter (HOSPITAL_COMMUNITY): Payer: Self-pay | Admitting: Emergency Medicine

## 2018-06-14 DIAGNOSIS — I10 Essential (primary) hypertension: Secondary | ICD-10-CM | POA: Insufficient documentation

## 2018-06-14 DIAGNOSIS — Y999 Unspecified external cause status: Secondary | ICD-10-CM | POA: Diagnosis not present

## 2018-06-14 DIAGNOSIS — M25562 Pain in left knee: Secondary | ICD-10-CM | POA: Diagnosis present

## 2018-06-14 DIAGNOSIS — Y929 Unspecified place or not applicable: Secondary | ICD-10-CM | POA: Diagnosis not present

## 2018-06-14 DIAGNOSIS — J45909 Unspecified asthma, uncomplicated: Secondary | ICD-10-CM | POA: Insufficient documentation

## 2018-06-14 DIAGNOSIS — Y939 Activity, unspecified: Secondary | ICD-10-CM | POA: Insufficient documentation

## 2018-06-14 DIAGNOSIS — Z79899 Other long term (current) drug therapy: Secondary | ICD-10-CM | POA: Diagnosis not present

## 2018-06-14 DIAGNOSIS — W010XXA Fall on same level from slipping, tripping and stumbling without subsequent striking against object, initial encounter: Secondary | ICD-10-CM | POA: Insufficient documentation

## 2018-06-14 MED ORDER — ACETAMINOPHEN 325 MG PO TABS
650.0000 mg | ORAL_TABLET | Freq: Once | ORAL | Status: AC
  Administered 2018-06-14: 650 mg via ORAL
  Filled 2018-06-14: qty 2

## 2018-06-14 NOTE — ED Triage Notes (Signed)
Per EMS, from home, reports trip and fall x2 weeks ago with increased pain and edema in left knee since that time. Ambulatory with cane.

## 2018-06-14 NOTE — ED Provider Notes (Signed)
[facility] Note   CSN: [id number] Arrival date & time: 06/14/18  1911     History   Chief Complaint Chief Complaint  Patient presents with  . Fall  . Knee Pain    HPI [name] is a 52 y.o. female with hx of asthma, Gastric reflux, low back pain, HTN and osteoarthritis who presents to the ED via EMS for left knee pain. Patient reports she tripped and fell 2 weeks ago and has had continued pain to the left knee with swelling. Patient denies other injuries. Patient ambulates with a cane.   HPI  Past Medical History:  Diagnosis Date  . Asthma   . Gastric reflux   . History of blood transfusion yrs agio, none recent  . Hypertension   . Osteoarthritis   . Sleep apnea    cpap machine has not used since 2014 due to machine/tubing in bad shape   . Syncope and collapse yrs ago    Patient Active Problem List   Diagnosis Date Noted  . Obesity 01/29/2018  . Acute left-sided low back pain with left-sided sciatica 11/27/2017  . Low back pain 10/30/2017  . Itching with irritation 06/23/2016  . Gastric reflux 05/04/2016  . Asthma 05/04/2016  . Essential hypertension 07/23/2015  . Mild depression (HCC) 07/23/2015  . Chest pain 06/06/2012  . Murmur, cardiac 06/06/2012    Past Surgical History:  Procedure Laterality Date  . CARDIAC CATHETERIZATION     more than 5 years ago  . CARDIAC CATHETERIZATION  06-07-2012   [facility]: Normal coronary arteries. No significant LVOT/aortic valve gradient  . CHOLECYSTECTOMY    . COLONOSCOPY WITH PROPOFOL N/A 02/27/2014   Procedure: COLONOSCOPY WITH PROPOFOL;  Surgeon: Charolett Bumpers, MD;  Location: [facility];  Service: Endoscopy;  Laterality: N/A;  . ESOPHAGOGASTRODUODENOSCOPY (EGD) WITH PROPOFOL N/A 02/27/2014   Procedure: ESOPHAGOGASTRODUODENOSCOPY (EGD) WITH PROPOFOL;  Surgeon: Charolett Bumpers, MD;  Location: [facility];  Service: Endoscopy;  Laterality: N/A;  . EXPLORATORY LAPAROTOMY   09/2001   ELAP, LOA, R partial oophorectomy, repair of bowel injury  . VAGINAL HYSTERECTOMY  2001   Adnexa left in place     OB History    Gravida  2   Para  2   Term  2   Preterm      AB      Living  2     SAB      TAB      Ectopic      Multiple      Live Births               Home Medications    Prior to Admission medications   Medication Sig Start Date End Date Taking? Authorizing Provider  albuterol (PROVENTIL HFA;VENTOLIN HFA) 108 (90 Base) MCG/ACT inhaler Inhale 2 puffs into the lungs 2 (two) times daily. 01/29/18   Hoy Register, MD  azithromycin (ZITHROMAX) 250 MG tablet Take 2 today then 1 daily 02/28/18   Anders Simmonds, PA-C  benzonatate (TESSALON) 100 MG capsule Take 2 capsules (200 mg total) by mouth 3 (three) times daily as needed for cough. 02/28/18   Anders Simmonds, PA-C  budesonide-formoterol (SYMBICORT) 160-4.5 MCG/ACT inhaler Inhale 2 puffs into the lungs 2 (two) times daily. 01/29/18   Hoy Register, MD  cetirizine (ZYRTEC) 10 MG tablet Take 1 tablet (10 mg total) by mouth daily. 01/29/18   Hoy Register, MD  cyclobenzaprine (FLEXERIL) 10 MG tablet Take 1  tablet (10 mg total) by mouth 2 (two) times daily as needed for muscle spasms. 04/25/17   Hoy Register, MD  diazepam (VALIUM) 5 MG tablet Take 1 by mouth 1 to 2 hours pre-procedure. May repeat if necessary. Patient not taking: Reported on 01/29/2018 11/29/17   Tyrell Antonio, MD  hydrocortisone cream 1 % Apply to affected area 2 times daily 12/08/17   Couture, Cortni S, PA-C  metoprolol succinate (TOPROL-XL) 25 MG 24 hr tablet Take 0.5 tablets (12.5 mg total) by mouth daily. 01/29/18   Hoy Register, MD  naproxen (NAPROSYN) 500 MG tablet Take 1 tablet (500 mg total) by mouth 2 (two) times daily with a meal. 08/29/17   Hoy Register, MD  omeprazole (PRILOSEC) 20 MG capsule Take 2 capsules (40 mg total) by mouth daily. 01/29/18 02/28/18  Hoy Register, MD    Family History No family  history on file.  Social History Social History   Tobacco Use  . Smoking status: Never Smoker  . Smokeless tobacco: Never Used  Substance Use Topics  . Alcohol use: No  . Drug use: No     Allergies   Tramadol   Review of Systems Review of Systems  Musculoskeletal: Positive for arthralgias.       Left knee pain  All other systems reviewed and are negative.    Physical Exam Updated Vital Signs BP (!) 145/90 (BP Location: Left Arm)   Pulse 71   Temp 99.1 F (37.3 C) (Oral)   Resp 18   SpO2 100%   Physical Exam  Constitutional: She appears well-developed and well-nourished. No distress.  Eyes: EOM are normal.  Neck: Neck supple.  Cardiovascular: Normal rate.  Pulmonary/Chest: Effort normal.  Musculoskeletal:       Left knee: She exhibits swelling (mild). She exhibits no deformity, no erythema, normal alignment and normal patellar mobility. Decreased range of motion: due to pain. Tenderness found. MCL tenderness noted.  Pedal pulses 2+  Neurological: She is alert.  Skin: Skin is warm and dry.  Psychiatric: She has a normal mood and affect.  Nursing note and vitals reviewed.    ED Treatments / Results  Labs (all labs ordered are listed, but only abnormal results are displayed) Labs Reviewed - No data to display  Radiology Dg Knee Complete 4 Views Left  Result Date: 06/14/2018 CLINICAL DATA:  Status post fall 2 weeks ago with increasing pain in the left knee. EXAM: LEFT KNEE - COMPLETE 4+ VIEW COMPARISON:  None. FINDINGS: No evidence of fracture, dislocation, or joint effusion. Mild degenerative joint changes with osteophyte formation are noted. Soft tissues are unremarkable. IMPRESSION: No acute fracture or dislocation. Electronically Signed   By: Sherian Rein M.D.   On: 06/14/2018 19:50    Procedures Procedures (including critical care time)  Medications Ordered in ED Medications  acetaminophen (TYLENOL) tablet 650 mg (650 mg Oral Given 06/14/18 2133)      Initial Impression / Assessment and Plan / ED Course  I have reviewed the triage vital signs and the nursing notes. 52 y.o. female here with left knee pain s/p fall that occurred 2 weeks ago. Knee immobilizer applied and patient reports the knee feeling much better. Patient stable for d/c without fracture or dislocation noted on x-ray. Pain managed in the ED. Patient to f/u with PCP. Return precautions discussed.  Final Clinical Impressions(s) / ED Diagnoses   Final diagnoses:  Left knee pain, unspecified chronicity    ED Discharge Orders    None  Kerrie Buffalo [place], [place] 06/14/18 2140    Melene Plan, DO 06/14/18 2230

## 2018-06-14 NOTE — Discharge Instructions (Addendum)
Wear the knee brace for comfort, follow up with your doctor. Take tylenol and ibuprofen as needed for pain.

## 2018-06-28 ENCOUNTER — Ambulatory Visit: Payer: Medicaid Other | Attending: Family Medicine | Admitting: Physician Assistant

## 2018-06-28 VITALS — BP 115/80 | HR 70 | Temp 99.1°F | Resp 18 | Ht 64.0 in | Wt 222.6 lb

## 2018-06-28 DIAGNOSIS — Z79899 Other long term (current) drug therapy: Secondary | ICD-10-CM | POA: Insufficient documentation

## 2018-06-28 DIAGNOSIS — M5442 Lumbago with sciatica, left side: Secondary | ICD-10-CM | POA: Diagnosis not present

## 2018-06-28 DIAGNOSIS — M25562 Pain in left knee: Secondary | ICD-10-CM | POA: Diagnosis present

## 2018-06-28 DIAGNOSIS — I10 Essential (primary) hypertension: Secondary | ICD-10-CM | POA: Diagnosis not present

## 2018-06-28 DIAGNOSIS — W19XXXA Unspecified fall, initial encounter: Secondary | ICD-10-CM | POA: Insufficient documentation

## 2018-06-28 DIAGNOSIS — Z09 Encounter for follow-up examination after completed treatment for conditions other than malignant neoplasm: Secondary | ICD-10-CM | POA: Diagnosis not present

## 2018-06-28 DIAGNOSIS — G8929 Other chronic pain: Secondary | ICD-10-CM

## 2018-06-28 DIAGNOSIS — Z888 Allergy status to other drugs, medicaments and biological substances status: Secondary | ICD-10-CM | POA: Insufficient documentation

## 2018-06-28 MED ORDER — ACETAMINOPHEN-CODEINE #3 300-30 MG PO TABS: 1.0000 | ORAL_TABLET | ORAL | 0 refills | Status: DC | PRN

## 2018-06-28 MED ORDER — NAPROXEN 500 MG PO TABS: 500.0000 mg | ORAL_TABLET | Freq: Two times a day (BID) | ORAL | 1 refills | Status: DC

## 2018-06-28 MED ORDER — DICLOFENAC SODIUM 1 % TD GEL: 2.0000 g | Freq: Four times a day (QID) | TRANSDERMAL | 1 refills | Status: DC

## 2018-06-28 MED FILL — NAPROXEN 500 MG TABLET: 500 | 30 days supply | Qty: 60 | Fill #0

## 2018-06-28 MED FILL — VOLTAREN 1% GEL: 1 | 17 days supply | Qty: 100 | Fill #0

## 2018-06-28 NOTE — Progress Notes (Signed)
Patient ID: [name], female   DOB: 30-Dec-1965, 52 y.o.   MRN: [id number]    Zoriyah Scheidegger, is a 52 y.o. female  [id number]  [id number]  DOB - 1965-12-23  Subjective:  Chief Complaint and HPI: [name] is a 52 y.o. female here today Seen in ED 06/14/2018 for L knee pain for 2 weeks s/p fall.  Arthritic changes on xray but no fracture.  Still having pain.  Still wearing immoboilizer.  Tylenol and ibuprofen not helping pain.    From A/P: I have reviewed the triage vital signs and the nursing notes. 52 y.o. female here with left knee pain s/p fall that occurred 2 weeks ago. Knee immobilizer applied and patient reports the knee feeling much better. Patient stable for d/c without fracture or dislocation noted on x-ray. Pain managed in the ED. Patient to f/u with PCP. Return precautions discussed.  ROS:   Constitutional:  No f/c, No night sweats, No unexplained weight loss. EENT:  No vision changes, No blurry vision, No hearing changes. No mouth, throat, or ear problems.  Respiratory: No cough, No SOB Cardiac: No CP, no palpitations GI:  No abd pain, No N/V/D. GU: No Urinary s/sx Musculoskeletal: knee pain Neuro: No headache, no dizziness, no motor weakness.  Skin: No rash Endocrine:  No polydipsia. No polyuria.  Psych: Denies SI/HI  No problems updated.  ALLERGIES: Allergies  Allergen Reactions  . Tramadol Hives, Itching and Swelling    Pt states after medication intake she had to go to the ER.     PAST MEDICAL HISTORY: Past Medical History:  Diagnosis Date  . Asthma   . Gastric reflux   . History of blood transfusion yrs agio, none recent  . Hypertension   . Osteoarthritis   . Sleep apnea    cpap machine has not used since 2014 due to machine/tubing in bad shape   . Syncope and collapse yrs ago    MEDICATIONS AT HOME: Prior to Admission medications   Medication Sig Start Date End Date Taking? Authorizing Provider  albuterol (PROVENTIL HFA;VENTOLIN  HFA) 108 (90 Base) MCG/ACT inhaler Inhale 2 puffs into the lungs 2 (two) times daily. 01/29/18  Yes Hoy Register, MD  budesonide-formoterol (SYMBICORT) 160-4.5 MCG/ACT inhaler Inhale 2 puffs into the lungs 2 (two) times daily. 01/29/18  Yes Hoy Register, MD  cetirizine (ZYRTEC) 10 MG tablet Take 1 tablet (10 mg total) by mouth daily. 01/29/18  Yes Hoy Register, MD  cyclobenzaprine (FLEXERIL) 10 MG tablet Take 1 tablet (10 mg total) by mouth 2 (two) times daily as needed for muscle spasms. 04/25/17  Yes Hoy Register, MD  hydrocortisone cream 1 % Apply to affected area 2 times daily 12/08/17  Yes Couture, Cortni S, PA-C  metoprolol succinate (TOPROL-XL) 25 MG 24 hr tablet Take 0.5 tablets (12.5 mg total) by mouth daily. 01/29/18  Yes Hoy Register, MD  acetaminophen-codeine (TYLENOL #3) 300-30 MG tablet Take 1 tablet by mouth every 4 (four) hours as needed for moderate pain. 06/28/18   Anders Simmonds, PA-C  diazepam (VALIUM) 5 MG tablet Take 1 by mouth 1 to 2 hours pre-procedure. May repeat if necessary. Patient not taking: Reported on 01/29/2018 11/29/17   Tyrell Antonio, MD  diclofenac sodium (VOLTAREN) 1 % GEL Apply 2 g topically 4 (four) times daily. 06/28/18   Anders Simmonds, PA-C  naproxen (NAPROSYN) 500 MG tablet Take 1 tablet (500 mg total) by mouth 2 (two) times daily with a meal. 06/28/18   Georgian Co  M, PA-C  omeprazole (PRILOSEC) 20 MG capsule Take 2 capsules (40 mg total) by mouth daily. 01/29/18 02/28/18  Hoy RegisterNewlin, Enobong, MD     Objective:  EXAM:   Vitals:   06/28/18 1121  BP: 115/80  Pulse: 70  Resp: 18  Temp: 99.1 F (37.3 C)  TempSrc: Oral  SpO2: 99%  Weight: 222 lb 9.6 oz (101 kg)  Height: 5\' 4"  (1.626 m)    General appearance : A&OX3. NAD. Non-toxic-appearing HEENT: Atraumatic and Normocephalic.  PERRLA. EOM intact.   Neck: supple, no JVD. No cervical lymphadenopathy. No thyromegaly Chest/Lungs:  Breathing-non-labored, Good air entry bilaterally, breath  sounds normal without rales, rhonchi, or wheezing  CVS: S1 S2 regular, no murmurs, gallops, rubs  L knee-no ballotment, erythema, or effusion.  Ligaments stable.   Extremities: Bilateral Lower Ext shows no edema, both legs are warm to touch with = pulse throughout Neurology:  CN II-XII grossly intact, Non focal.   Psych:  TP linear. J/I WNL. Normal speech. Appropriate eye contact and affect.  Skin:  No Rash  Data Review Lab Results  Component Value Date   HGBA1C 5.4 05/29/2017   HGBA1C 5.5 12/10/2013     Assessment & Plan   1. Pain in lateral portion of left knee-s/p fall.  ?ligamnet/meniscus damage? - naproxen (NAPROSYN) 500 MG tablet; Take 1 tablet (500 mg total) by mouth 2 (two) times daily with a meal.  Dispense: 60 tablet; Refill: 1 - diclofenac sodium (VOLTAREN) 1 % GEL; Apply 2 g topically 4 (four) times daily.  Dispense: 100 g; Refill: 1 - acetaminophen-codeine (TYLENOL #3) 300-30 MG tablet; Take 1 tablet by mouth every 4 (four) hours as needed for moderate pain.  Dispense: 20 tablet; Refill: 0 - Ambulatory referral to Orthopedic Surgery  2. Encounter for examination following treatment at hospital  Patient have been counseled extensively about nutrition and exercise  Return for keep 9/25 appt with Dr Alvis LemmingsNewlin.  The patient was given clear instructions to go to ER or return to medical center if symptoms don't improve, worsen or new problems develop. The patient verbalized understanding. The patient was told to call to get lab results if they haven't heard anything in the next week.     Georgian CoAngela Tiaria Biby, PA-C [facility] [place], [place] [phone]   06/28/2018, 11:33 AM

## 2018-07-02 ENCOUNTER — Encounter (INDEPENDENT_AMBULATORY_CARE_PROVIDER_SITE_OTHER): Payer: Self-pay | Admitting: Physician Assistant

## 2018-07-02 ENCOUNTER — Ambulatory Visit (INDEPENDENT_AMBULATORY_CARE_PROVIDER_SITE_OTHER): Payer: Medicaid Other | Admitting: Physician Assistant

## 2018-07-02 DIAGNOSIS — M25562 Pain in left knee: Secondary | ICD-10-CM | POA: Diagnosis not present

## 2018-07-02 MED ORDER — METHYLPREDNISOLONE ACETATE 40 MG/ML IJ SUSP
40.0000 mg | INTRAMUSCULAR | Status: AC | PRN
  Administered 2018-07-02: 40 mg via INTRA_ARTICULAR

## 2018-07-02 MED ORDER — LIDOCAINE HCL 1 % IJ SOLN
3.0000 mL | INTRAMUSCULAR | Status: AC | PRN
  Administered 2018-07-02: 3 mL

## 2018-07-02 NOTE — Progress Notes (Signed)
Office Visit Note   Patient: [name]           Date of Birth: 09/16/1966           MRN: [id number] Visit Date: 07/02/2018              Requested by: Anders SimmondsMcClung, Angela M, PA-C [address] [place], [place] [address] PCP: Hoy RegisterNewlin, Enobong, MD   Assessment & Plan: Visit Diagnoses:  1. Acute pain of left knee     Plan: She will work on gentle range of motion the knee.  Be mindful of any catching locking giving way or painful popping.  See her back in 2 weeks to see what type of response she had to the injection.  Pain continues despite these conservative measures obtain an MRI to rule out internal derangement.  Follow-Up Instructions: Return in about 2 weeks (around 07/16/2018).   Orders:  Orders Placed This Encounter  Procedures  . Large Joint Inj   No orders of the defined types were placed in this encounter.     Procedures: Large Joint Inj: L knee on 07/02/2018 11:10 AM Indications: pain Details: 22 G 1.5 in needle, anterolateral approach  Arthrogram: No  Medications: 3 mL lidocaine 1 %; 40 mg methylPREDNISolone acetate 40 MG/ML Outcome: tolerated well, no immediate complications Procedure, treatment alternatives, risks and benefits explained, specific risks discussed. Consent was given by the patient. Immediately prior to procedure a time out was called to verify the correct patient, procedure, equipment, support staff and site/side marked as required. Patient was prepped and draped in the usual sterile fashion.       Clinical Data: No additional findings.   Subjective: Chief Complaint  Patient presents with  . Left Knee - Pain    HPI [name] [age] year old female comes in today with left knee pain for now 5 weeks.  She reports that she was sitting on chair when the chair gave way causing her fall on her left knee.  She went to the ER on 06/14/2018 where radiographs of her left knee were obtained.  These were independently reviewed and show no acute  fracture.  Mild to moderate medial compartmental arthritic changes and moderate patellofemoral changes.  Lateral compartment appears well-preserved.  She states that she continues to have giving way weakness of the knee.'s been taking naproxen.  She notes no swelling of the knee. Review of Systems Denies fevers chills shortness of breath chest pain  Objective: Vital Signs: There were no vitals taken for this visit.  Physical Exam  Constitutional: She appears well-developed and well-nourished. No distress.  Cardiovascular: Intact distal pulses.  Pulmonary/Chest: Effort normal.  Neurological: She is alert.  Skin: She is not diaphoretic.    Ortho Exam Bilateral knees good range of motion without pain.  She has tenderness along medial joint line of the left knee.  No instability valgus varus stressing of either knee.  McMurray's negative on the right.  Left knee McMurray is equivocal due to patient's pain.  No abnormal warmth erythema or effusion of either knee.  She is able to do a straight leg raise on the left.  Left calf supple nontender. Specialty Comments:  No specialty comments available.  Imaging: No results found.   PMFS History: Patient Active Problem List   Diagnosis Date Noted  . Obesity 01/29/2018  . Acute left-sided low back pain with left-sided sciatica 11/27/2017  . Low back pain 10/30/2017  . Itching with irritation 06/23/2016  . Gastric reflux 05/04/2016  .  Asthma 05/04/2016  . Essential hypertension 07/23/2015  . Mild depression (HCC) 07/23/2015  . Chest pain 06/06/2012  . Murmur, cardiac 06/06/2012   Past Medical History:  Diagnosis Date  . Asthma   . Gastric reflux   . History of blood transfusion yrs agio, none recent  . Hypertension   . Osteoarthritis   . Sleep apnea    cpap machine has not used since 2014 due to machine/tubing in bad shape   . Syncope and collapse yrs ago    History reviewed. No pertinent family history.  Past Surgical History:    Procedure Laterality Date  . CARDIAC CATHETERIZATION     more than 5 years ago  . CARDIAC CATHETERIZATION  06-07-2012   [facility]: Normal coronary arteries. No significant LVOT/aortic valve gradient  . CHOLECYSTECTOMY    . COLONOSCOPY WITH PROPOFOL N/A 02/27/2014   Procedure: COLONOSCOPY WITH PROPOFOL;  Surgeon: Charolett Bumpers, MD;  Location: [facility];  Service: Endoscopy;  Laterality: N/A;  . ESOPHAGOGASTRODUODENOSCOPY (EGD) WITH PROPOFOL N/A 02/27/2014   Procedure: ESOPHAGOGASTRODUODENOSCOPY (EGD) WITH PROPOFOL;  Surgeon: Charolett Bumpers, MD;  Location: [facility];  Service: Endoscopy;  Laterality: N/A;  . EXPLORATORY LAPAROTOMY  09/2001   ELAP, LOA, R partial oophorectomy, repair of bowel injury  . VAGINAL HYSTERECTOMY  2001   Adnexa left in place   Social History   Occupational History  . Not on file  Tobacco Use  . Smoking status: Never Smoker  . Smokeless tobacco: Never Used  Substance and Sexual Activity  . Alcohol use: No  . Drug use: No  . Sexual activity: Never    Birth control/protection: Surgical

## 2018-07-04 ENCOUNTER — Ambulatory Visit: Payer: Medicaid Other | Admitting: Family Medicine

## 2018-07-16 ENCOUNTER — Ambulatory Visit (INDEPENDENT_AMBULATORY_CARE_PROVIDER_SITE_OTHER): Payer: Medicaid Other | Admitting: Physician Assistant

## 2018-10-04 ENCOUNTER — Emergency Department (HOSPITAL_COMMUNITY)
Admission: EM | Admit: 2018-10-04 | Discharge: 2018-10-04 | Disposition: A | Discharge status: Home / Self Care | Payer: Medicaid Other | Attending: Emergency Medicine | Admitting: Emergency Medicine

## 2018-10-04 ENCOUNTER — Emergency Department (HOSPITAL_COMMUNITY): Payer: Medicaid Other

## 2018-10-04 ENCOUNTER — Other Ambulatory Visit: Payer: Self-pay

## 2018-10-04 ENCOUNTER — Encounter (HOSPITAL_COMMUNITY): Payer: Self-pay

## 2018-10-04 DIAGNOSIS — R05 Cough: Secondary | ICD-10-CM | POA: Diagnosis present

## 2018-10-04 DIAGNOSIS — J4520 Mild intermittent asthma, uncomplicated: Secondary | ICD-10-CM

## 2018-10-04 DIAGNOSIS — J45998 Other asthma: Secondary | ICD-10-CM | POA: Insufficient documentation

## 2018-10-04 DIAGNOSIS — I10 Essential (primary) hypertension: Secondary | ICD-10-CM | POA: Diagnosis not present

## 2018-10-04 DIAGNOSIS — Z79899 Other long term (current) drug therapy: Secondary | ICD-10-CM | POA: Diagnosis not present

## 2018-10-04 LAB — INFLUENZA PANEL BY PCR (TYPE A & B)
Influenza A By PCR: NEGATIVE
Influenza B By PCR: NEGATIVE

## 2018-10-04 MED ORDER — IPRATROPIUM-ALBUTEROL 0.5-2.5 (3) MG/3ML IN SOLN
3.0000 mL | Freq: Once | RESPIRATORY_TRACT | Status: AC
  Administered 2018-10-04: 3 mL via RESPIRATORY_TRACT
  Filled 2018-10-04: qty 3

## 2018-10-04 MED ORDER — ALBUTEROL SULFATE (2.5 MG/3ML) 0.083% IN NEBU
2.5000 mg | INHALATION_SOLUTION | Freq: Once | RESPIRATORY_TRACT | Status: AC
  Administered 2018-10-04: 2.5 mg via RESPIRATORY_TRACT
  Filled 2018-10-04: qty 3

## 2018-10-04 MED ORDER — PREDNISONE 50 MG PO TABS: ORAL_TABLET | ORAL | 0 refills | Status: DC

## 2018-10-04 MED ORDER — BENZONATATE 100 MG PO CAPS: 100.0000 mg | ORAL_CAPSULE | Freq: Three times a day (TID) | ORAL | 0 refills | Status: DC

## 2018-10-04 MED ORDER — PREDNISONE 20 MG PO TABS
60.0000 mg | ORAL_TABLET | Freq: Once | ORAL | Status: AC
  Administered 2018-10-04: 60 mg via ORAL
  Filled 2018-10-04: qty 3

## 2018-10-04 MED ORDER — ALBUTEROL SULFATE HFA 108 (90 BASE) MCG/ACT IN AERS
2.0000 | INHALATION_SPRAY | RESPIRATORY_TRACT | Status: DC | PRN
  Administered 2018-10-04: 2 via RESPIRATORY_TRACT
  Filled 2018-10-04: qty 6.7

## 2018-10-04 NOTE — Discharge Instructions (Addendum)
Use your home medications in addition to the medications we give you. Call your doctor tomorrow for follow up. Return here for worsening symptoms.

## 2018-10-04 NOTE — ED Triage Notes (Signed)
Per EMS- patient c/o fatigue and a cough x 3 days. Lungs clear. Ambulatory.

## 2018-10-04 NOTE — ED Provider Notes (Signed)
[facility] Note   CSN: [id number] Arrival date & time: 10/04/18  1606     History   Chief Complaint Chief Complaint  Patient presents with  . Fatigue  . Cough    HPI [name] is a 52 y.o. female who presents to the ED via EMS for cough and fatigue x 2 weeks. Patient reports she has been taking OTC medications at home but over the past 2 days symptoms have gotten much worse. Patient states she has trouble sleeping at night due to the cough. Patient reports using her inhaler without relief.   The history is provided by the patient. No language interpreter was used.  Cough  This is a new problem. The current episode started more than 1 week ago. The cough is productive of purulent sputum. Associated symptoms include headaches, rhinorrhea, sore throat, myalgias, shortness of breath, wheezing and eye redness. Pertinent negatives include no chills and no ear pain. She is not a smoker. Her past medical history is significant for asthma.    Past Medical History:  Diagnosis Date  . Asthma   . Gastric reflux   . History of blood transfusion yrs agio, none recent  . Hypertension   . Osteoarthritis   . Sleep apnea    cpap machine has not used since 2014 due to machine/tubing in bad shape   . Syncope and collapse yrs ago    Patient Active Problem List   Diagnosis Date Noted  . Obesity 01/29/2018  . Acute left-sided low back pain with left-sided sciatica 11/27/2017  . Low back pain 10/30/2017  . Itching with irritation 06/23/2016  . Gastric reflux 05/04/2016  . Asthma 05/04/2016  . Essential hypertension 07/23/2015  . Mild depression (HCC) 07/23/2015  . Chest pain 06/06/2012  . Murmur, cardiac 06/06/2012    Past Surgical History:  Procedure Laterality Date  . CARDIAC CATHETERIZATION     more than 5 years ago  . CARDIAC CATHETERIZATION  06-07-2012   [facility]: Normal coronary arteries. No significant LVOT/aortic valve gradient  .  CHOLECYSTECTOMY    . COLONOSCOPY WITH PROPOFOL N/A 02/27/2014   Procedure: COLONOSCOPY WITH PROPOFOL;  Surgeon: Charolett BumpersMartin K Johnson, MD;  Location: [facility];  Service: Endoscopy;  Laterality: N/A;  . ESOPHAGOGASTRODUODENOSCOPY (EGD) WITH PROPOFOL N/A 02/27/2014   Procedure: ESOPHAGOGASTRODUODENOSCOPY (EGD) WITH PROPOFOL;  Surgeon: Charolett BumpersMartin K Johnson, MD;  Location: [facility];  Service: Endoscopy;  Laterality: N/A;  . EXPLORATORY LAPAROTOMY  09/2001   ELAP, LOA, R partial oophorectomy, repair of bowel injury  . VAGINAL HYSTERECTOMY  2001   Adnexa left in place     OB History    Gravida  2   Para  2   Term  2   Preterm      AB      Living  2     SAB      TAB      Ectopic      Multiple      Live Births               Home Medications    Prior to Admission medications   Medication Sig Start Date End Date Taking? Authorizing Provider  acetaminophen-codeine (TYLENOL #3) 300-30 MG tablet Take 1 tablet by mouth every 4 (four) hours as needed for moderate pain. 06/28/18   Anders SimmondsMcClung, Angela M, PA-C  albuterol (PROVENTIL HFA;VENTOLIN HFA) 108 (90 Base) MCG/ACT inhaler Inhale 2 puffs into the lungs 2 (two) times daily. 01/29/18  Hoy Register, MD  benzonatate (TESSALON) 100 MG capsule Take 1 capsule (100 mg total) by mouth every 8 (eight) hours. 10/04/18   Janne Napoleon, NP  budesonide-formoterol [facility]) 160-4.5 MCG/ACT inhaler Inhale 2 puffs into the lungs 2 (two) times daily. 01/29/18   Hoy Register, MD  cetirizine (ZYRTEC) 10 MG tablet Take 1 tablet (10 mg total) by mouth daily. 01/29/18   Hoy Register, MD  diazepam (VALIUM) 5 MG tablet Take 1 by mouth 1 to 2 hours pre-procedure. May repeat if necessary. Patient not taking: Reported on 01/29/2018 11/29/17   Tyrell Antonio, MD  hydrocortisone cream 1 % Apply to affected area 2 times daily 12/08/17   Couture, Cortni S, PA-C  metoprolol succinate (TOPROL-XL) 25 MG 24 hr tablet Take 0.5 tablets (12.5 mg total) by mouth  daily. 01/29/18   Hoy Register, MD  naproxen (NAPROSYN) 500 MG tablet Take 1 tablet (500 mg total) by mouth 2 (two) times daily with a meal. 06/28/18   McClung, Marzella Schlein, PA-C  omeprazole (PRILOSEC) 20 MG capsule Take 2 capsules (40 mg total) by mouth daily. 01/29/18 02/28/18  Hoy Register, MD  predniSONE (DELTASONE) 50 MG tablet Starting 10/05/2018 take one tablet PO daily 10/04/18   Janne Napoleon, NP  sertraline (ZOLOFT) 50 MG tablet TK 1 T PO IN THE MORNING 04/27/18   [provider]    Family History History reviewed. No pertinent family history.  Social History Social History   Tobacco Use  . Smoking status: Never Smoker  . Smokeless tobacco: Never Used  Substance Use Topics  . Alcohol use: No  . Drug use: No     Allergies   Tramadol   Review of Systems Review of Systems  Constitutional: Positive for fatigue. Negative for chills.  HENT: Positive for rhinorrhea and sore throat. Negative for ear pain.   Eyes: Positive for redness.  Respiratory: Positive for cough, shortness of breath and wheezing.   Gastrointestinal: Positive for abdominal pain. Negative for nausea and vomiting.  Genitourinary: Negative for dysuria, frequency and urgency.  Musculoskeletal: Positive for myalgias.  Skin: Negative for rash.  Neurological: Positive for headaches.  Psychiatric/Behavioral: Negative for confusion.     Physical Exam Updated Vital Signs BP (!) 153/82 (BP Location: Right Arm)   Pulse 80   Temp 98.3 F (36.8 C) (Oral)   Resp 17   Ht 5\' 4"  (1.626 m)   Wt 99.8 kg   SpO2 100%   BMI 37.76 kg/m   Physical Exam Vitals signs and nursing note reviewed.  Constitutional:      General: She is not in acute distress.    Appearance: She is well-developed.  HENT:     Right Ear: Tympanic membrane normal.     Left Ear: Tympanic membrane normal.     Nose: Congestion present.     Mouth/Throat:     Pharynx: Oropharynx is clear.  Eyes:     Extraocular Movements:  Extraocular movements intact.     Conjunctiva/sclera:     Right eye: Right conjunctiva is injected.  Neck:     Musculoskeletal: Neck supple.  Cardiovascular:     Rate and Rhythm: Normal rate and regular rhythm.  Pulmonary:     Breath sounds: Decreased air movement present. Wheezing present.     Comments: Inspiratory and expiratory wheezes bilateral Abdominal:     Palpations: Abdomen is soft.     Tenderness: There is no abdominal tenderness.  Musculoskeletal: Normal range of motion.  Skin:  General: Skin is warm and dry.  Neurological:     Mental Status: She is alert and oriented to person, place, and time.     Cranial Nerves: No cranial nerve deficit.  Psychiatric:        Mood and Affect: Mood normal.      ED Treatments / Results  Labs (all labs ordered are listed, but only abnormal results are displayed) Labs Reviewed  INFLUENZA PANEL BY PCR (TYPE A & B)   Radiology Dg Chest 2 View  Result Date: 10/04/2018 CLINICAL DATA:  Fatigue and cough for 3 days EXAM: CHEST - 2 VIEW COMPARISON:  06/18/2017 FINDINGS: The heart size and mediastinal contours are within normal limits. Both lungs are clear. The visualized skeletal structures are unremarkable. IMPRESSION: No active cardiopulmonary disease. Electronically Signed   By: Alcide CleverMark  Lukens M.D.   On: 10/04/2018 19:27    Procedures Procedures (including critical care time)  Medications Ordered in ED Medications  albuterol (PROVENTIL HFA;VENTOLIN HFA) 108 (90 Base) MCG/ACT inhaler 2 puff (2 puffs Inhalation Given 10/04/18 2033)  ipratropium-albuterol (DUONEB) 0.5-2.5 (3) MG/3ML nebulizer solution 3 mL (3 mLs Nebulization Given 10/04/18 1910)  predniSONE (DELTASONE) tablet 60 mg (60 mg Oral Given 10/04/18 1909)  albuterol (PROVENTIL) (2.5 MG/3ML) 0.083% nebulizer solution 2.5 mg (2.5 mg Nebulization Given 10/04/18 2033)   After neb treatments patient reports feeling much better. Re examined after first neb and wheezing has  improved and after second neb no wheezing.   Initial Impression / Assessment and Plan / ED Course  I have reviewed the triage vital signs and the nursing notes. 52 y.o. female here with cough and wheezing stable for d/c significant improvement with treatment in the ED and O2 Sat 100% on R/A. Will treat with prednisone and tessalon and patient will continue her home medications for her asthma. Return precautions discussed.   Final Clinical Impressions(s) / ED Diagnoses   Final diagnoses:  Mild intermittent asthmatic bronchitis without complication    ED Discharge Orders         Ordered    predniSONE (DELTASONE) 50 MG tablet     10/04/18 2123    benzonatate (TESSALON) 100 MG capsule  Every 8 hours     10/04/18 2123           Kerrie Buffaloeese, Vaness Jelinski BartowM, NP 10/04/18 2130    Jacalyn LefevreHaviland, Julie, MD 10/04/18 2238

## 2018-10-15 ENCOUNTER — Other Ambulatory Visit: Payer: Self-pay | Admitting: Family Medicine

## 2018-10-15 DIAGNOSIS — I10 Essential (primary) hypertension: Secondary | ICD-10-CM

## 2018-10-16 ENCOUNTER — Other Ambulatory Visit: Payer: Self-pay | Admitting: Family Medicine

## 2018-10-16 DIAGNOSIS — I10 Essential (primary) hypertension: Secondary | ICD-10-CM

## 2018-11-22 ENCOUNTER — Telehealth: Payer: Self-pay | Admitting: Family Medicine

## 2018-11-22 NOTE — Telephone Encounter (Signed)
Patient would like to know what she should do for the pain.

## 2018-11-22 NOTE — Telephone Encounter (Signed)
Will route to PCP for review. 

## 2018-11-22 NOTE — Telephone Encounter (Signed)
Call was placed to patient and a voicemail was left informing patient to contact ortho for medication for knee pain.

## 2018-11-22 NOTE — Telephone Encounter (Signed)
She is currently on naproxen and her chart indicates allergy to tramadol.  She might have to get in touch with her orthopedics.

## 2018-11-22 NOTE — Telephone Encounter (Signed)
Patient called because she says she is beginning to have trouble with her knees again. Patient states she can barely walk on it and is feeling pain. Next available appt is 03/05 advised to go to the ED or UC if needing to be seen sooner. Please follow up.

## 2018-11-26 ENCOUNTER — Ambulatory Visit (INDEPENDENT_AMBULATORY_CARE_PROVIDER_SITE_OTHER): Payer: Medicaid Other | Admitting: Physician Assistant

## 2018-11-26 ENCOUNTER — Ambulatory Visit (INDEPENDENT_AMBULATORY_CARE_PROVIDER_SITE_OTHER): Payer: Medicaid Other

## 2018-11-26 ENCOUNTER — Encounter (INDEPENDENT_AMBULATORY_CARE_PROVIDER_SITE_OTHER): Payer: Self-pay | Admitting: Physician Assistant

## 2018-11-26 DIAGNOSIS — M25561 Pain in right knee: Secondary | ICD-10-CM | POA: Diagnosis not present

## 2018-11-26 DIAGNOSIS — M170 Bilateral primary osteoarthritis of knee: Secondary | ICD-10-CM

## 2018-11-26 MED ORDER — LIDOCAINE HCL 1 % IJ SOLN
3.0000 mL | INTRAMUSCULAR | Status: AC | PRN
  Administered 2018-11-26: 3 mL

## 2018-11-26 MED ORDER — METHYLPREDNISOLONE ACETATE 40 MG/ML IJ SUSP
40.0000 mg | INTRAMUSCULAR | Status: AC | PRN
  Administered 2018-11-26: 40 mg via INTRA_ARTICULAR

## 2018-11-26 NOTE — Progress Notes (Signed)
Office Visit Note   Patient: [name]           Date of Birth: 02-02-1966           MRN: [id number] Visit Date: 11/26/2018              Requested by: Hoy Register, MD [address] [place], [place] [address] PCP: Hoy Register, MD   Assessment & Plan: Visit Diagnoses:  1. Right knee pain, unspecified chronicity   2. Primary osteoarthritis of both knees     Plan: She will continue work on quad strengthening both knees.  Follow-up with [place] as needed basis.  She understands that she can have cortisone injections in the knees no more often than every 3 months.  Follow-Up Instructions: Return if symptoms worsen or fail to improve.   Orders:  Orders Placed This Encounter  Procedures  . Large Joint Inj: bilateral knee  . XR Knee 1-2 Views Right   No orders of the defined types were placed in this encounter.     Procedures: Large Joint Inj: bilateral knee on 11/26/2018 1:17 PM Indications: pain Details: 22 G 1.5 in needle, anterolateral approach  Arthrogram: No  Medications (Right): 3 mL lidocaine 1 %; 40 mg methylPREDNISolone acetate 40 MG/ML Medications (Left): 3 mL lidocaine 1 %; 40 mg methylPREDNISolone acetate 40 MG/ML Outcome: tolerated well, no immediate complications Procedure, treatment alternatives, risks and benefits explained, specific risks discussed. Consent was given by the patient. Immediately prior to procedure a time out was called to verify the correct patient, procedure, equipment, support staff and site/side marked as required. Patient was prepped and draped in the usual sterile fashion.       Clinical Data: No additional findings.   Subjective: Chief Complaint  Patient presents with  . Left Knee - Pain  . Right Knee - Pain    HPI [name] is well-known.by my service comes in today for left knee pain which she has known osteoarthritis involving mainly medial compartment.  She had an injection on 07/02/2018 which she states this  helped.  She is also now having right knee pain.  She is asking for injections in both knees today.  She is nondiabetic.  She had no pain no injury to either knee. Review of Systems Please see HPI  Objective: Vital Signs: There were no vitals taken for this visit.  Physical Exam Constitutional:      Appearance: She is not ill-appearing or diaphoretic.  Pulmonary:     Effort: Pulmonary effort is normal.  Neurological:     Mental Status: She is alert and oriented to person, place, and time.     Ortho Exam  Good range of motion bilateral knees.  Tenderness along medial joint line of both knees.  No effusion abnormal warmth of either knee. Specialty Comments:  No specialty comments available.  Imaging: Xr Knee 1-2 Views Right  Result Date: 11/26/2018 Right knee AP lateral views mild narrowing the medial joint line.  Mild to moderate patellofemoral changes.  Lateral compartment well-maintained.  No subluxation dislocation knee.  No bony abnormalities otherwise.    PMFS History: Patient Active Problem List   Diagnosis Date Noted  . Obesity 01/29/2018  . Acute left-sided low back pain with left-sided sciatica 11/27/2017  . Low back pain 10/30/2017  . Itching with irritation 06/23/2016  . Gastric reflux 05/04/2016  . Asthma 05/04/2016  . Essential hypertension 07/23/2015  . Mild depression (HCC) 07/23/2015  . Chest pain 06/06/2012  .  Murmur, cardiac 06/06/2012   Past Medical History:  Diagnosis Date  . Asthma   . Gastric reflux   . History of blood transfusion yrs agio, none recent  . Hypertension   . Osteoarthritis   . Sleep apnea    cpap machine has not used since 2014 due to machine/tubing in bad shape   . Syncope and collapse yrs ago    No family history on file.  Past Surgical History:  Procedure Laterality Date  . CARDIAC CATHETERIZATION     more than 5 years ago  . CARDIAC CATHETERIZATION  06-07-2012   [facility]: Normal coronary arteries. No significant  LVOT/aortic valve gradient  . CHOLECYSTECTOMY    . COLONOSCOPY WITH PROPOFOL N/A 02/27/2014   Procedure: COLONOSCOPY WITH PROPOFOL;  Surgeon: Charolett Bumpers, MD;  Location: [facility];  Service: Endoscopy;  Laterality: N/A;  . ESOPHAGOGASTRODUODENOSCOPY (EGD) WITH PROPOFOL N/A 02/27/2014   Procedure: ESOPHAGOGASTRODUODENOSCOPY (EGD) WITH PROPOFOL;  Surgeon: Charolett Bumpers, MD;  Location: [facility];  Service: Endoscopy;  Laterality: N/A;  . EXPLORATORY LAPAROTOMY  09/2001   ELAP, LOA, R partial oophorectomy, repair of bowel injury  . VAGINAL HYSTERECTOMY  2001   Adnexa left in place   Social History   Occupational History  . Not on file  Tobacco Use  . Smoking status: Never Smoker  . Smokeless tobacco: Never Used  Substance and Sexual Activity  . Alcohol use: No  . Drug use: No  . Sexual activity: Never    Birth control/protection: Surgical

## 2018-11-28 ENCOUNTER — Other Ambulatory Visit: Payer: Self-pay | Admitting: Family Medicine

## 2018-11-28 DIAGNOSIS — I10 Essential (primary) hypertension: Secondary | ICD-10-CM

## 2018-12-13 ENCOUNTER — Ambulatory Visit: Payer: Medicaid Other | Attending: Family Medicine | Admitting: Family Medicine

## 2018-12-13 ENCOUNTER — Other Ambulatory Visit: Payer: Self-pay | Admitting: Family Medicine

## 2018-12-13 ENCOUNTER — Encounter: Payer: Self-pay | Admitting: Family Medicine

## 2018-12-13 ENCOUNTER — Other Ambulatory Visit: Payer: Self-pay

## 2018-12-13 VITALS — BP 113/74 | HR 63 | Temp 97.6°F | Ht 64.0 in | Wt 234.0 lb

## 2018-12-13 DIAGNOSIS — F320 Major depressive disorder, single episode, mild: Secondary | ICD-10-CM

## 2018-12-13 DIAGNOSIS — K219 Gastro-esophageal reflux disease without esophagitis: Secondary | ICD-10-CM | POA: Diagnosis not present

## 2018-12-13 DIAGNOSIS — Z131 Encounter for screening for diabetes mellitus: Secondary | ICD-10-CM | POA: Diagnosis not present

## 2018-12-13 DIAGNOSIS — F32A Depression, unspecified: Secondary | ICD-10-CM

## 2018-12-13 DIAGNOSIS — J4530 Mild persistent asthma, uncomplicated: Secondary | ICD-10-CM | POA: Diagnosis not present

## 2018-12-13 DIAGNOSIS — I10 Essential (primary) hypertension: Secondary | ICD-10-CM

## 2018-12-13 DIAGNOSIS — M79652 Pain in left thigh: Secondary | ICD-10-CM

## 2018-12-13 LAB — POCT GLYCOSYLATED HEMOGLOBIN (HGB A1C): HbA1c, POC (controlled diabetic range): 5.8 % (ref 0.0–7.0)

## 2018-12-13 MED ORDER — BUDESONIDE-FORMOTEROL FUMARATE 160-4.5 MCG/ACT IN AERO: 2.0000 | INHALATION_SPRAY | Freq: Two times a day (BID) | RESPIRATORY_TRACT | 6 refills | Status: DC

## 2018-12-13 MED ORDER — METOPROLOL SUCCINATE ER 25 MG PO TB24: ORAL_TABLET | ORAL | 6 refills | Status: DC

## 2018-12-13 MED ORDER — TIZANIDINE HCL 4 MG PO TABS: 4.0000 mg | ORAL_TABLET | Freq: Three times a day (TID) | ORAL | 2 refills | Status: DC | PRN

## 2018-12-13 MED ORDER — OMEPRAZOLE 20 MG PO CPDR: 40.0000 mg | DELAYED_RELEASE_CAPSULE | Freq: Every day | ORAL | 1 refills | Status: DC

## 2018-12-13 MED ORDER — ALBUTEROL SULFATE HFA 108 (90 BASE) MCG/ACT IN AERS: 2.0000 | INHALATION_SPRAY | Freq: Two times a day (BID) | RESPIRATORY_TRACT | 6 refills | Status: DC

## 2018-12-13 NOTE — Patient Instructions (Signed)
Persistent Depressive Disorder  Persistent depressive disorder (PDD) is a mental health condition. PDD causes symptoms of low-level depression for 2 years or longer. It may also be called long-term (chronic) depression or dysthymia. PDD may include episodes of more severe depression that last for about 2 weeks (major depressive disorder or MDD). PDD can affect the way you think, feel, and sleep. This condition may also affect your relationships. You may be more likely to get sick if you have PDD. Symptoms of PDD occur for most of the day and may include:  Feeling tired (fatigue).  Low energy.  Eating too much or too little.  Sleeping too much or too little.  Feeling restless or agitated.  Feeling hopeless.  Feeling worthless or guilty.  Feeling worried or nervous (anxiety).  Trouble concentrating or making decisions.  Low self-esteem.  A negative way of looking at things (outlook).  Not being able to have fun or feel pleasure.  Avoiding interacting with people.  Getting angry or annoyed easily (irritability).  Acting aggressive or angry. Follow these instructions at home: Activity  Go back to your normal activities as told by your doctor.  Exercise regularly as told by your doctor. General instructions  Take over-the-counter and prescription medicines only as told by your doctor.  Do not drink alcohol. Or, limit how much alcohol you drink to no more than 1 drink a day for nonpregnant women and 2 drinks a day for men. One drink equals 12 oz of beer, 5 oz of wine, or 1 oz of hard liquor. Alcohol can affect any antidepressant medicines you are taking. Talk with your doctor about your alcohol use.  Eat a healthy diet and get plenty of sleep.  Find activities that you enjoy each day.  Consider joining a support group. Your doctor may be able to suggest a support group.  Keep all follow-up visits as told by your doctor. This is important. Where to find more  information National Alliance on Mental Illness  www.nami.org [place] National Institute of Mental Health  www.nimh.nih.gov National Suicide Prevention Lifeline  (1-800-273-8255).  This is free, 24-hour help. Contact a doctor if:  Your symptoms get worse.  You have new symptoms.  You have trouble sleeping or doing your daily activities. Get help right away if:  You self-harm.  You have serious thoughts about hurting yourself or others.  You see, hear, taste, smell, or feel things that are not there (hallucinate). This information is not intended to replace advice given to you by your health care provider. Make sure you discuss any questions you have with your health care provider. Document Released: 09/07/2015 Document Revised: 05/20/2016 Document Reviewed: 05/20/2016 Elsevier Interactive Patient Education  2019 [employer]  

## 2018-12-13 NOTE — Progress Notes (Signed)
Subjective:  Patient ID: [name], female    DOB: 02-Jul-1966  Age: 53 y.o. MRN: [id number]  CC: Knee Pain   HPI Thyra Yinger is a 53 year old female with a history of hypertension who comes in for a follow up of Hypertension, Asthma, GERD and low back pain. She received bilateral injections at her visit with orthopedics last month and reports improvement in knee pain. Today she complains of left thigh cramps which are worse at night and last a couple of minutes.  It improves with walking around and moving but returns when she lies down.  Tylenol provides some relief.  She has enrolled in an exercise class which she attends with a friend with him of losing weight. Compliant with her antihypertensive.  Asthma has been controlled with no recent exacerbations and her back pain is stable. Reflux symptoms have been controlled but she uses omeprazole as needed. She endorses intermittent depressive symptoms but denies suicidal ideations or intent.  Previously followed by psychiatry and a therapist and was on medications but is unable to recall the name.  Past Medical History:  Diagnosis Date  . Asthma   . Gastric reflux   . History of blood transfusion yrs agio, none recent  . Hypertension   . Osteoarthritis   . Sleep apnea    cpap machine has not used since 2014 due to machine/tubing in bad shape   . Syncope and collapse yrs ago    Past Surgical History:  Procedure Laterality Date  . CARDIAC CATHETERIZATION     more than 5 years ago  . CARDIAC CATHETERIZATION  06-07-2012   [facility]: Normal coronary arteries. No significant LVOT/aortic valve gradient  . CHOLECYSTECTOMY    . COLONOSCOPY WITH PROPOFOL N/A 02/27/2014   Procedure: COLONOSCOPY WITH PROPOFOL;  Surgeon: Garlan Fair, MD;  Location: [facility];  Service: Endoscopy;  Laterality: N/A;  . ESOPHAGOGASTRODUODENOSCOPY (EGD) WITH PROPOFOL N/A 02/27/2014   Procedure: ESOPHAGOGASTRODUODENOSCOPY (EGD) WITH PROPOFOL;  Surgeon:  Garlan Fair, MD;  Location: [facility];  Service: Endoscopy;  Laterality: N/A;  . EXPLORATORY LAPAROTOMY  09/2001   ELAP, LOA, R partial oophorectomy, repair of bowel injury  . VAGINAL HYSTERECTOMY  2001   Adnexa left in place    History reviewed. No pertinent family history.  Allergies  Allergen Reactions  . Tramadol Hives, Itching and Swelling    Pt states after medication intake she had to go to the ER.     Outpatient Medications Prior to Visit  Medication Sig Dispense Refill  . cetirizine (ZYRTEC) 10 MG tablet Take 1 tablet (10 mg total) by mouth daily. 30 tablet 2  . albuterol (PROVENTIL HFA;VENTOLIN HFA) 108 (90 Base) MCG/ACT inhaler Inhale 2 puffs into the lungs 2 (two) times daily. 1 Inhaler 6  . budesonide-formoterol (SYMBICORT) 160-4.5 MCG/ACT inhaler Inhale 2 puffs into the lungs 2 (two) times daily. 1 Inhaler 6  . metoprolol succinate (TOPROL-XL) 25 MG 24 hr tablet TAKE 1/2 TABLET(12.5 MG) BY MOUTH DAILY... MUST MAKE APPT FOR REFILLS 15 tablet 0  . hydrocortisone cream 1 % Apply to affected area 2 times daily (Patient not taking: Reported on 12/13/2018) 15 g 0  . naproxen (NAPROSYN) 500 MG tablet Take 1 tablet (500 mg total) by mouth 2 (two) times daily with a meal. (Patient not taking: Reported on 12/13/2018) 60 tablet 1  . predniSONE (DELTASONE) 50 MG tablet Starting 10/05/2018 take one tablet PO daily (Patient not taking: Reported on 12/13/2018) 5 tablet 0  . sertraline (ZOLOFT)  50 MG tablet TK 1 T PO IN THE MORNING  0  . acetaminophen-codeine (TYLENOL #3) 300-30 MG tablet Take 1 tablet by mouth every 4 (four) hours as needed for moderate pain. (Patient not taking: Reported on 12/13/2018) 20 tablet 0  . benzonatate (TESSALON) 100 MG capsule Take 1 capsule (100 mg total) by mouth every 8 (eight) hours. (Patient not taking: Reported on 12/13/2018) 21 capsule 0  . diazepam (VALIUM) 5 MG tablet Take 1 by mouth 1 to 2 hours pre-procedure. May repeat if necessary. (Patient not  taking: Reported on 01/29/2018) 2 tablet 0  . omeprazole (PRILOSEC) 20 MG capsule Take 2 capsules (40 mg total) by mouth daily. 30 capsule 6   No facility-administered medications prior to visit.      ROS Review of Systems  Constitutional: Negative for activity change, appetite change and fatigue.  HENT: Negative for congestion, sinus pressure and sore throat.   Eyes: Negative for visual disturbance.  Respiratory: Negative for cough, chest tightness, shortness of breath and wheezing.   Cardiovascular: Negative for chest pain and palpitations.  Gastrointestinal: Negative for abdominal distention, abdominal pain and constipation.  Endocrine: Negative for polydipsia.  Genitourinary: Negative for dysuria and frequency.  Musculoskeletal:       See Hpi  Skin: Negative for rash.  Neurological: Negative for tremors, light-headedness and numbness.  Hematological: Does not bruise/bleed easily.  Psychiatric/Behavioral: Negative for agitation, behavioral problems and suicidal ideas.       Positive for depression    Objective:  BP 113/74   Pulse 63   Temp 97.6 F (36.4 C) (Oral)   Ht 5' 4" (1.626 m)   Wt 234 lb (106.1 kg)   SpO2 98%   BMI 40.17 kg/m   BP/Weight 12/13/2018 10/04/2018 5/67/2091  Systolic BP 980 221 798  Diastolic BP 74 81 80  Wt. (Lbs) 234 220 222.6  BMI 40.17 37.76 38.21      Physical Exam Constitutional:      Appearance: She is well-developed.  Cardiovascular:     Rate and Rhythm: Normal rate.     Heart sounds: Normal heart sounds. No murmur.  Pulmonary:     Effort: Pulmonary effort is normal.     Breath sounds: Normal breath sounds. No wheezing or rales.  Chest:     Chest wall: No tenderness.  Abdominal:     General: Bowel sounds are normal. There is no distension.     Palpations: Abdomen is soft. There is no mass.     Tenderness: There is no abdominal tenderness.  Musculoskeletal: Normal range of motion.        General: Tenderness (TTP of left thigh  muscles) present.  Neurological:     Mental Status: She is alert and oriented to person, place, and time.  Psychiatric:        Mood and Affect: Mood normal.     CMP Latest Ref Rng & Units 01/29/2018 09/05/2017 06/29/2017  Glucose 65 - 99 mg/dL 73 63(L) 89  BUN 6 - 24 mg/dL _0 Creatinine 0.57 - 1.00 mg/dL 0.52(L) 0.55(L) 0.50  Sodium 134 - 144 mmol/L 144 146(H) 145  Potassium 3.5 - 5.2 mmol/L 3.7 3.8 3.2(L)  Chloride 96 - 106 mmol/L 104 106 106  CO2 20 - 29 mmol/L 26 26 -  Calcium 8.7 - 10.2 mg/dL 9.6 9.7 -  Total Protein 6.0 - 8.5 g/dL 6.4 - -  Total Bilirubin 0.0 - 1.2 mg/dL 0.5 - -  Alkaline Phos 39 -  117 IU/L 103 - -  AST 0 - 40 IU/L 16 - -  ALT 0 - 32 IU/L 52(H) - -    Lipid Panel     Component Value Date/Time   CHOL 173 01/29/2018 1433   CHOL 167 03/18/2012 0522   TRIG 70 01/29/2018 1433   TRIG 58 03/18/2012 0522   HDL 55 01/29/2018 1433   HDL 60 03/18/2012 0522   CHOLHDL 3.1 01/29/2018 1433   CHOLHDL 3.4 05/04/2016 1004   VLDL 18 05/04/2016 1004   VLDL 12 03/18/2012 0522   LDLCALC 104 (H) 01/29/2018 1433   LDLCALC 95 03/18/2012 0522    CBC    Component Value Date/Time   WBC 6.4 11/14/2016 1400   RBC 4.64 11/14/2016 1400   HGB 12.2 06/29/2017 0142   HGB 11.9 (L) 05/19/2013 2105   HCT 36.0 06/29/2017 0142   HCT 35.4 05/19/2013 2105   PLT 294 11/14/2016 1400   PLT 285 05/19/2013 2105   MCV 83.2 11/14/2016 1400   MCV 81 05/19/2013 2105   MCH 27.2 11/14/2016 1400   MCHC 32.6 11/14/2016 1400   RDW 14.2 11/14/2016 1400   RDW 13.8 05/19/2013 2105   LYMPHSABS 2,048 11/14/2016 1400   LYMPHSABS 1.1 06/04/2012 1136   LYMPHSABS 1.4 03/17/2012 0549   MONOABS 448 11/14/2016 1400   MONOABS 0.3 03/17/2012 0549   EOSABS 128 11/14/2016 1400   EOSABS 0.0 06/04/2012 1136   EOSABS 0.1 03/17/2012 0549   BASOSABS 64 11/14/2016 1400   BASOSABS 0.0 06/04/2012 1136   BASOSABS 0.0 03/17/2012 0549    Lab Results  Component Value Date   HGBA1C 5.4 05/29/2017      Assessment & Plan:   1. Mild persistent asthma without complication Controlled - albuterol (PROVENTIL HFA;VENTOLIN HFA) 108 (90 Base) MCG/ACT inhaler; Inhale 2 puffs into the lungs 2 (two) times daily.  Dispense: 1 Inhaler; Refill: 6 - budesonide-formoterol (SYMBICORT) 160-4.5 MCG/ACT inhaler; Inhale 2 puffs into the lungs 2 (two) times daily.  Dispense: 1 Inhaler; Refill: 6  2. Diabetes mellitus screening Normal - POCT glycosylated hemoglobin (Hb A1C)  3. Essential hypertension Controlled Counseled on blood pressure goal of less than 130/80, low-sodium, DASH diet, medication compliance, 150 minutes of moderate intensity exercise per week. Discussed medication compliance, adverse effects. - metoprolol succinate (TOPROL-XL) 25 MG 24 hr tablet; TAKE 1/2 TABLET(12.5 MG) BY MOUTH DAILY.  Dispense: 15 tablet; Refill: 6 - CMP14+EGFR - Lipid panel  4. Gastroesophageal reflux disease, esophagitis presence not specified Stable Uses Prilosec as needed - omeprazole (PRILOSEC) 20 MG capsule; Take 2 capsules (40 mg total) by mouth daily for 30 days.  Dispense: 30 capsule; Refill: 1  5. Mild depression ([place]) Uncontrolled Currently not on antidepressant but promises to make appointment with her psychiatrist and therapist  6. Pain of left thigh Likely muscle spasm Symptoms are controlled on Tylenol OTC Tizanidine added - tiZANidine (ZANAFLEX) 4 MG tablet; Take 1 tablet (4 mg total) by mouth every 8 (eight) hours as needed for muscle spasms.  Dispense: 60 tablet; Refill: 2   Meds ordered this encounter  Medications  . tiZANidine (ZANAFLEX) 4 MG tablet    Sig: Take 1 tablet (4 mg total) by mouth every 8 (eight) hours as needed for muscle spasms.    Dispense:  60 tablet    Refill:  2  . albuterol (PROVENTIL HFA;VENTOLIN HFA) 108 (90 Base) MCG/ACT inhaler    Sig: Inhale 2 puffs into the lungs 2 (two) times daily.  Dispense:  1 Inhaler    Refill:  6  . budesonide-formoterol  (SYMBICORT) 160-4.5 MCG/ACT inhaler    Sig: Inhale 2 puffs into the lungs 2 (two) times daily.    Dispense:  1 Inhaler    Refill:  6  . metoprolol succinate (TOPROL-XL) 25 MG 24 hr tablet    Sig: TAKE 1/2 TABLET(12.5 MG) BY MOUTH DAILY.    Dispense:  15 tablet    Refill:  6  . omeprazole (PRILOSEC) 20 MG capsule    Sig: Take 2 capsules (40 mg total) by mouth daily for 30 days.    Dispense:  30 capsule    Refill:  1    Follow-up: Return in about 6 months (around 06/15/2019) for Follow-up of chronic medical conditions.       Charlott Rakes, MD, FAAFP. [facility] and [place] [place], [place]   12/13/2018, 10:59 AM

## 2018-12-14 ENCOUNTER — Telehealth: Payer: Self-pay

## 2018-12-14 LAB — LIPID PANEL
Chol/HDL Ratio: 2.8 ratio (ref 0.0–4.4)
Cholesterol, Total: 181 mg/dL (ref 100–199)
HDL: 64 mg/dL (ref >39)
LDL Calculated: 105 mg/dL — ABNORMAL HIGH (ref 0–99)
Triglycerides: 60 mg/dL (ref 0–149)
VLDL Cholesterol Cal: 12 mg/dL (ref 5–40)

## 2018-12-14 LAB — CMP14+EGFR
ALT: 33 IU/L — ABNORMAL HIGH (ref 0–32)
AST: 14 IU/L (ref 0–40)
Albumin/Globulin Ratio: 1.9 (ref 1.2–2.2)
Albumin: 4.2 g/dL (ref 3.8–4.9)
Alkaline Phosphatase: 116 IU/L (ref 39–117)
BUN/Creatinine Ratio: 21 (ref 9–23)
BUN: 11 mg/dL (ref 6–24)
Bilirubin Total: 0.4 mg/dL (ref 0.0–1.2)
CO2: 25 mmol/L (ref 20–29)
Calcium: 9.7 mg/dL (ref 8.7–10.2)
Chloride: 101 mmol/L (ref 96–106)
Creatinine, Ser: 0.53 mg/dL — ABNORMAL LOW (ref 0.57–1.00)
GFR calc Af Amer: 124 mL/min/{1.73_m2} (ref >59)
GFR calc non Af Amer: 108 mL/min/{1.73_m2} (ref >59)
Globulin, Total: 2.2 g/dL (ref 1.5–4.5)
Glucose: 79 mg/dL (ref 65–99)
Potassium: 3.4 mmol/L — ABNORMAL LOW (ref 3.5–5.2)
Sodium: 141 mmol/L (ref 134–144)
Total Protein: 6.4 g/dL (ref 6.0–8.5)

## 2018-12-14 NOTE — Telephone Encounter (Signed)
Patient was called and informed of lab results. 

## 2018-12-14 NOTE — Telephone Encounter (Signed)
-----   Message from Hoy Register, MD sent at 12/14/2018  9:15 AM EST ----- Potassium is slightly low.  Please encourage intake of bananas and potassium rich foods.  Other labs are stable

## 2018-12-21 ENCOUNTER — Telehealth: Payer: Self-pay | Admitting: Family Medicine

## 2018-12-21 NOTE — Telephone Encounter (Signed)
[name] with [facility] called on the behalf of the patient states the patient has BP concerns and would like to see if they could be given a bp monitor to watch their bp from home. Please follow up.

## 2018-12-21 NOTE — Telephone Encounter (Signed)
Will route to PCP for review. 

## 2018-12-24 MED ORDER — BLOOD PRESSURE KIT: PACK | 0 refills | Status: DC

## 2018-12-24 NOTE — Telephone Encounter (Signed)
Patient was called and informed of script being ready for pick up °

## 2018-12-24 NOTE — Telephone Encounter (Signed)
Prescription written.  Not sure if Medicaid will cover this but if they do not she might have to obtain one OTC.

## 2019-01-10 ENCOUNTER — Telehealth: Payer: Self-pay | Admitting: Family Medicine

## 2019-01-10 NOTE — Telephone Encounter (Signed)
Can you send this to the triage nurse.

## 2019-01-10 NOTE — Telephone Encounter (Signed)
Patient called stating she is having headaches and loss of appetite and would like to know what she can do. Please follow up.

## 2019-01-10 NOTE — Telephone Encounter (Signed)
Pt called back stating she was returning the missedcall please follow up

## 2019-01-10 NOTE — Telephone Encounter (Signed)
rerouting

## 2019-01-10 NOTE — Telephone Encounter (Signed)
Patients call returned.  Patient identified by name and date of birth. Patient is complaining of a headache for a day and a half.  Patient states pain is severe.  Patient also has ringing in ear.  Patient taking tylenol and benadryl without relief  Patient advised to continue medication regiment and this message would be forwarded to Doctor.

## 2019-01-11 NOTE — Telephone Encounter (Signed)
Please schedule her with any available clinician for telephone visit.

## 2019-01-11 NOTE — Telephone Encounter (Signed)
Patients name and MRN number given to registration who will schedule an appointment.

## 2019-01-14 ENCOUNTER — Encounter: Payer: Self-pay | Admitting: Family Medicine

## 2019-01-14 ENCOUNTER — Ambulatory Visit: Payer: Medicaid Other | Attending: Family Medicine | Admitting: Family Medicine

## 2019-01-14 ENCOUNTER — Other Ambulatory Visit: Payer: Self-pay

## 2019-01-14 DIAGNOSIS — R51 Headache: Secondary | ICD-10-CM | POA: Diagnosis not present

## 2019-01-14 DIAGNOSIS — R519 Headache, unspecified: Secondary | ICD-10-CM

## 2019-01-14 MED ORDER — FLUTICASONE PROPIONATE 50 MCG/ACT NA SUSP: 2.0000 | Freq: Every day | NASAL | 1 refills | Status: DC

## 2019-01-14 NOTE — Progress Notes (Signed)
Virtual Visit via Telephone Note  I connected with [name] on 01/14/19 at  9:30 AM EDT by telephone and verified that I am speaking with the correct person using two identifiers.   I discussed the limitations, risks, security and privacy concerns of performing an evaluation and management service by telephone and the availability of in person appointments. I also discussed with the patient that there may be a patient responsible charge related to this service. The patient expressed understanding and agreed to proceed.   History of Present Illness: [name] is a 53 year old female with a history of Hypertension, Asthma, GERD and low back pain. She complains of headaches for the last 2 weeks which have been intermittent and occur in the central aspect of her head and does not radiate.  She denies visual concerns, nausea, vomiting, dizziness or gait abnormalities.  Symptoms improved with the use of Benadryl and Tylenol.  She denies postnasal drip, cough, rhinorrhea, fever. Previously had tinnitus which has resolved. While speaking to her the telephone line went dead and I was unable to reach her despite calling back several times but prior to the disconnection I had informed her I would place her on Flonase for this.  Observations/Objective: Alert, awake, oriented x3 Not in acute distress  Assessment and Plan: 1. Sinus headache Advised to continue Zyrtec Discussed that if symptoms worsen she will need to present to the ED as this visit is limited by its virtual nature - fluticasone (FLONASE) 50 MCG/ACT nasal spray; Place 2 sprays into both nostrils daily.  Dispense: 16 g; Refill: 1 .   Follow Up Instructions:     I discussed the assessment and treatment plan with the patient. The patient was provided an opportunity to ask questions and all were answered. The patient agreed with the plan and demonstrated an understanding of the instructions.   The patient was advised to call  back or seek an in-person evaluation if the symptoms worsen or if the condition fails to improve as anticipated.  I provided 9 minutes of non-face-to-face time during this encounter.   Hoy Register, MD

## 2019-01-14 NOTE — Progress Notes (Signed)
Patient has been called and DOB has been verified. Patient has been screened and transferred to PCP to start phone visit.  C/C: headaches  Refills:

## 2019-06-20 ENCOUNTER — Ambulatory Visit: Payer: Medicaid Other | Admitting: Physician Assistant

## 2019-07-15 ENCOUNTER — Encounter: Payer: Self-pay | Admitting: Family Medicine

## 2019-07-15 ENCOUNTER — Other Ambulatory Visit: Payer: Self-pay | Admitting: Family Medicine

## 2019-07-15 ENCOUNTER — Ambulatory Visit: Payer: Medicaid Other | Attending: Family Medicine | Admitting: Family Medicine

## 2019-07-15 ENCOUNTER — Other Ambulatory Visit: Payer: Self-pay

## 2019-07-15 VITALS — BP 117/77 | HR 65 | Temp 98.0°F | Ht 64.0 in | Wt 224.0 lb

## 2019-07-15 DIAGNOSIS — Z0000 Encounter for general adult medical examination without abnormal findings: Secondary | ICD-10-CM | POA: Diagnosis not present

## 2019-07-15 DIAGNOSIS — Z9049 Acquired absence of other specified parts of digestive tract: Secondary | ICD-10-CM | POA: Diagnosis not present

## 2019-07-15 DIAGNOSIS — R5383 Other fatigue: Secondary | ICD-10-CM | POA: Insufficient documentation

## 2019-07-15 DIAGNOSIS — M1990 Unspecified osteoarthritis, unspecified site: Secondary | ICD-10-CM | POA: Diagnosis not present

## 2019-07-15 DIAGNOSIS — K219 Gastro-esophageal reflux disease without esophagitis: Secondary | ICD-10-CM | POA: Insufficient documentation

## 2019-07-15 DIAGNOSIS — I10 Essential (primary) hypertension: Secondary | ICD-10-CM | POA: Diagnosis not present

## 2019-07-15 DIAGNOSIS — Z888 Allergy status to other drugs, medicaments and biological substances status: Secondary | ICD-10-CM | POA: Insufficient documentation

## 2019-07-15 DIAGNOSIS — Z23 Encounter for immunization: Secondary | ICD-10-CM | POA: Diagnosis not present

## 2019-07-15 DIAGNOSIS — Z79899 Other long term (current) drug therapy: Secondary | ICD-10-CM | POA: Diagnosis not present

## 2019-07-15 DIAGNOSIS — Z1231 Encounter for screening mammogram for malignant neoplasm of breast: Secondary | ICD-10-CM

## 2019-07-15 DIAGNOSIS — R519 Headache, unspecified: Secondary | ICD-10-CM | POA: Diagnosis not present

## 2019-07-15 DIAGNOSIS — Z90710 Acquired absence of both cervix and uterus: Secondary | ICD-10-CM | POA: Diagnosis not present

## 2019-07-15 DIAGNOSIS — J4530 Mild persistent asthma, uncomplicated: Secondary | ICD-10-CM | POA: Insufficient documentation

## 2019-07-15 DIAGNOSIS — Z0001 Encounter for general adult medical examination with abnormal findings: Secondary | ICD-10-CM | POA: Diagnosis not present

## 2019-07-15 DIAGNOSIS — Z7951 Long term (current) use of inhaled steroids: Secondary | ICD-10-CM | POA: Diagnosis not present

## 2019-07-15 DIAGNOSIS — G4730 Sleep apnea, unspecified: Secondary | ICD-10-CM | POA: Insufficient documentation

## 2019-07-15 MED ORDER — METOPROLOL SUCCINATE ER 25 MG PO TB24: ORAL_TABLET | ORAL | 6 refills | Status: DC

## 2019-07-15 MED ORDER — BUDESONIDE-FORMOTEROL FUMARATE 160-4.5 MCG/ACT IN AERO: 2.0000 | INHALATION_SPRAY | Freq: Two times a day (BID) | RESPIRATORY_TRACT | 6 refills | Status: DC

## 2019-07-15 MED ORDER — FLUTICASONE PROPIONATE 50 MCG/ACT NA SUSP: 2.0000 | Freq: Every day | NASAL | 1 refills | Status: DC

## 2019-07-15 MED ORDER — BLOOD PRESSURE KIT: PACK | 0 refills | Status: AC

## 2019-07-15 NOTE — Patient Instructions (Signed)
Health Maintenance, Female Adopting a healthy lifestyle and getting preventive care are important in promoting health and wellness. Ask your health care provider about:  The right schedule for you to have regular tests and exams.  Things you can do on your own to prevent diseases and keep yourself healthy. What should I know about diet, weight, and exercise? Eat a healthy diet   Eat a diet that includes plenty of vegetables, fruits, low-fat dairy products, and lean protein.  Do not eat a lot of foods that are high in solid fats, added sugars, or sodium. Maintain a healthy weight Body mass index (BMI) is used to identify weight problems. It estimates body fat based on height and weight. Your health care provider can help determine your BMI and help you achieve or maintain a healthy weight. Get regular exercise Get regular exercise. This is one of the most important things you can do for your health. Most adults should:  Exercise for at least 150 minutes each week. The exercise should increase your heart rate and make you sweat (moderate-intensity exercise).  Do strengthening exercises at least twice a week. This is in addition to the moderate-intensity exercise.  Spend less time sitting. Even light physical activity can be beneficial. Watch cholesterol and blood lipids Have your blood tested for lipids and cholesterol at 55 years of age, then have this test every 5 years. Have your cholesterol levels checked more often if:  Your lipid or cholesterol levels are high.  You are older than 55 years of age.  You are at high risk for heart disease. What should I know about cancer screening? Depending on your health history and family history, you may need to have cancer screening at various ages. This may include screening for:  Breast cancer.  Cervical cancer.  Colorectal cancer.  Skin cancer.  Lung cancer. What should I know about heart disease, diabetes, and high blood  pressure? Blood pressure and heart disease  High blood pressure causes heart disease and increases the risk of stroke. This is more likely to develop in people who have high blood pressure readings, are of African descent, or are overweight.  Have your blood pressure checked: ? Every 3-5 years if you are 18-39 years of age. ? Every year if you are 40 years old or older. Diabetes Have regular diabetes screenings. This checks your fasting blood sugar level. Have the screening done:  Once every three years after age 40 if you are at a normal weight and have a low risk for diabetes.  More often and at a younger age if you are overweight or have a high risk for diabetes. What should I know about preventing infection? Hepatitis B If you have a higher risk for hepatitis B, you should be screened for this virus. Talk with your health care provider to find out if you are at risk for hepatitis B infection. Hepatitis C Testing is recommended for:  Everyone born from 1945 through 1965.  Anyone with known risk factors for hepatitis C. Sexually transmitted infections (STIs)  Get screened for STIs, including gonorrhea and chlamydia, if: ? You are sexually active and are younger than 55 years of age. ? You are older than 55 years of age and your health care provider tells you that you are at risk for this type of infection. ? Your sexual activity has changed since you were last screened, and you are at increased risk for chlamydia or gonorrhea. Ask your health care provider if   you are at risk.  Ask your health care provider about whether you are at high risk for HIV. Your health care provider may recommend a prescription medicine to help prevent HIV infection. If you choose to take medicine to prevent HIV, you should first get tested for HIV. You should then be tested every 3 months for as long as you are taking the medicine. Pregnancy  If you are about to stop having your period (premenopausal) and  you may become pregnant, seek counseling before you get pregnant.  Take 400 to 800 micrograms (mcg) of folic acid every day if you become pregnant.  Ask for birth control (contraception) if you want to prevent pregnancy. Osteoporosis and menopause Osteoporosis is a disease in which the bones lose minerals and strength with aging. This can result in bone fractures. If you are 65 years old or older, or if you are at risk for osteoporosis and fractures, ask your health care provider if you should:  Be screened for bone loss.  Take a calcium or vitamin D supplement to lower your risk of fractures.  Be given hormone replacement therapy (HRT) to treat symptoms of menopause. Follow these instructions at home: Lifestyle  Do not use any products that contain nicotine or tobacco, such as cigarettes, e-cigarettes, and chewing tobacco. If you need help quitting, ask your health care provider.  Do not use street drugs.  Do not share needles.  Ask your health care provider for help if you need support or information about quitting drugs. Alcohol use  Do not drink alcohol if: ? Your health care provider tells you not to drink. ? You are pregnant, may be pregnant, or are planning to become pregnant.  If you drink alcohol: ? Limit how much you use to 0-1 drink a day. ? Limit intake if you are breastfeeding.  Be aware of how much alcohol is in your drink. In the [place] one drink equals one 12 oz bottle of beer (355 mL), one 5 oz glass of wine (148 mL), or one 1 oz glass of hard liquor (44 mL). General instructions  Schedule regular health, dental, and eye exams.  Stay current with your vaccines.  Tell your health care provider if: ? You often feel depressed. ? You have ever been abused or do not feel safe at home. Summary  Adopting a healthy lifestyle and getting preventive care are important in promoting health and wellness.  Follow your health care provider's instructions about healthy  diet, exercising, and getting tested or screened for diseases.  Follow your health care provider's instructions on monitoring your cholesterol and blood pressure. This information is not intended to replace advice given to you by your health care provider. Make sure you discuss any questions you have with your health care provider. Document Released: 04/11/2011 Document Revised: 09/19/2018 Document Reviewed: 09/19/2018 Elsevier Patient Education  2020 [employer]  

## 2019-07-16 ENCOUNTER — Other Ambulatory Visit: Payer: Self-pay | Admitting: Family Medicine

## 2019-07-16 LAB — CMP14+EGFR
ALT: 26 IU/L (ref 0–32)
AST: 14 IU/L (ref 0–40)
Albumin/Globulin Ratio: 1.6 (ref 1.2–2.2)
Albumin: 4.1 g/dL (ref 3.8–4.9)
Alkaline Phosphatase: 119 IU/L — ABNORMAL HIGH (ref 39–117)
BUN/Creatinine Ratio: 17 (ref 9–23)
BUN: 9 mg/dL (ref 6–24)
Bilirubin Total: 0.5 mg/dL (ref 0.0–1.2)
CO2: 25 mmol/L (ref 20–29)
Calcium: 9.2 mg/dL (ref 8.7–10.2)
Chloride: 108 mmol/L — ABNORMAL HIGH (ref 96–106)
Creatinine, Ser: 0.54 mg/dL — ABNORMAL LOW (ref 0.57–1.00)
GFR calc Af Amer: 123 mL/min/{1.73_m2} (ref >59)
GFR calc non Af Amer: 107 mL/min/{1.73_m2} (ref >59)
Globulin, Total: 2.6 g/dL (ref 1.5–4.5)
Glucose: 80 mg/dL (ref 65–99)
Potassium: 3.7 mmol/L (ref 3.5–5.2)
Sodium: 143 mmol/L (ref 134–144)
Total Protein: 6.7 g/dL (ref 6.0–8.5)

## 2019-07-16 LAB — LIPID PANEL
Chol/HDL Ratio: 3.4 ratio (ref 0.0–4.4)
Cholesterol, Total: 193 mg/dL (ref 100–199)
HDL: 56 mg/dL (ref >39)
LDL Chol Calc (NIH): 125 mg/dL — ABNORMAL HIGH (ref 0–99)
Triglycerides: 63 mg/dL (ref 0–149)
VLDL Cholesterol Cal: 12 mg/dL (ref 5–40)

## 2019-07-16 LAB — VITAMIN D 25 HYDROXY (VIT D DEFICIENCY, FRACTURES): Vit D, 25-Hydroxy: 15.5 ng/mL — ABNORMAL LOW (ref 30.0–100.0)

## 2019-07-16 MED ORDER — ERGOCALCIFEROL 1.25 MG (50000 UT) PO CAPS: 50000.0000 [IU] | ORAL_CAPSULE | ORAL | 1 refills | Status: DC

## 2019-07-16 NOTE — Progress Notes (Signed)
Subjective:  Patient ID: [name], female    DOB: 11-19-1965  Age: 53 y.o. MRN: [id number]  CC: Annual Exam   HPI [name] presents for an annual physical exam She is due for mammogram Pap smear not indicated due to previous hysterectomy. She complains of fatigue which occurs regardless of activity which has been ongoing for a while now.  Past Medical History:  Diagnosis Date  . Asthma   . Gastric reflux   . History of blood transfusion yrs agio, none recent  . Hypertension   . Osteoarthritis   . Sleep apnea    cpap machine has not used since 2014 due to machine/tubing in bad shape   . Syncope and collapse yrs ago    Past Surgical History:  Procedure Laterality Date  . CARDIAC CATHETERIZATION     more than 5 years ago  . CARDIAC CATHETERIZATION  06-07-2012   [facility]: Normal coronary arteries. No significant LVOT/aortic valve gradient  . CHOLECYSTECTOMY    . COLONOSCOPY WITH PROPOFOL N/A 02/27/2014   Procedure: COLONOSCOPY WITH PROPOFOL;  Surgeon: Garlan Fair, MD;  Location: [facility];  Service: Endoscopy;  Laterality: N/A;  . ESOPHAGOGASTRODUODENOSCOPY (EGD) WITH PROPOFOL N/A 02/27/2014   Procedure: ESOPHAGOGASTRODUODENOSCOPY (EGD) WITH PROPOFOL;  Surgeon: Garlan Fair, MD;  Location: [facility];  Service: Endoscopy;  Laterality: N/A;  . EXPLORATORY LAPAROTOMY  09/2001   ELAP, LOA, R partial oophorectomy, repair of bowel injury  . VAGINAL HYSTERECTOMY  2001   Adnexa left in place    History reviewed. No pertinent family history.  Allergies  Allergen Reactions  . Tramadol Hives, Itching and Swelling    Pt states after medication intake she had to go to the ER.     Outpatient Medications Prior to Visit  Medication Sig Dispense Refill  . albuterol (PROVENTIL HFA;VENTOLIN HFA) 108 (90 Base) MCG/ACT inhaler Inhale 2 puffs into the lungs 2 (two) times daily. 1 Inhaler 6  . cetirizine (ZYRTEC) 10 MG tablet Take 1 tablet (10 mg total) by mouth daily.  30 tablet 2  . sertraline (ZOLOFT) 50 MG tablet TK 1 T PO IN THE MORNING  0  . tiZANidine (ZANAFLEX) 4 MG tablet Take 1 tablet (4 mg total) by mouth every 8 (eight) hours as needed for muscle spasms. 60 tablet 2  . Blood Pressure KIT Use as instructed Dx: Hypertension 1 each 0  . budesonide-formoterol (SYMBICORT) 160-4.5 MCG/ACT inhaler Inhale 2 puffs into the lungs 2 (two) times daily. 1 Inhaler 6  . fluticasone (FLONASE) 50 MCG/ACT nasal spray Place 2 sprays into both nostrils daily. 16 g 1  . metoprolol succinate (TOPROL-XL) 25 MG 24 hr tablet TAKE 1/2 TABLET(12.5 MG) BY MOUTH DAILY. 15 tablet 6  . omeprazole (PRILOSEC) 20 MG capsule Take 2 capsules (40 mg total) by mouth daily for 30 days. 30 capsule 1   No facility-administered medications prior to visit.      ROS Review of Systems  Constitutional: Positive for fatigue. Negative for activity change and appetite change.  HENT: Negative for congestion, sinus pressure and sore throat.   Eyes: Negative for visual disturbance.  Respiratory: Negative for cough, chest tightness, shortness of breath and wheezing.   Cardiovascular: Negative for chest pain and palpitations.  Gastrointestinal: Negative for abdominal distention, abdominal pain and constipation.  Endocrine: Negative for polydipsia.  Genitourinary: Negative for dysuria and frequency.  Musculoskeletal: Negative for arthralgias and back pain.  Skin: Negative for rash.  Neurological: Negative for tremors, light-headedness and numbness.  Hematological: Does not bruise/bleed easily.  Psychiatric/Behavioral: Negative for agitation and behavioral problems.    Objective:  BP 117/77   Pulse 65   Temp 98 F (36.7 C) (Oral)   Ht _0  (1.626 m)   Wt 224 lb (101.6 kg)   SpO2 95%   BMI 38.45 kg/m   BP/Weight 07/15/2019 12/13/2018 71/21/9758  Systolic BP 832 549 826  Diastolic BP 77 74 81  Wt. (Lbs) 224 234 220  BMI 38.45 40.17 37.76      Physical Exam Constitutional:       General: She is not in acute distress.    Appearance: She is well-developed. She is not diaphoretic.  HENT:     Head: Normocephalic.     Right Ear: External ear normal.     Left Ear: External ear normal.     Nose: Nose normal.  Eyes:     Conjunctiva/sclera: Conjunctivae normal.     Pupils: Pupils are equal, round, and reactive to light.  Neck:     Musculoskeletal: Normal range of motion.     Vascular: No JVD.  Cardiovascular:     Rate and Rhythm: Normal rate and regular rhythm.     Heart sounds: Murmur present. No gallop.   Pulmonary:     Effort: Pulmonary effort is normal. No respiratory distress.     Breath sounds: Normal breath sounds. No wheezing or rales.  Chest:     Chest wall: No tenderness.     Breasts:        Right: Normal.   Abdominal:     General: Bowel sounds are normal. There is no distension.     Palpations: Abdomen is soft. There is no mass.     Tenderness: There is no abdominal tenderness.  Musculoskeletal: Normal range of motion.        General: No tenderness.     Right lower leg: No edema.     Left lower leg: No edema.  Skin:    General: Skin is warm and dry.  Neurological:     Mental Status: She is alert and oriented to person, place, and time.     Deep Tendon Reflexes: Reflexes are normal and symmetric.  Psychiatric:        Mood and Affect: Mood normal.     CMP Latest Ref Rng & Units 07/15/2019 12/13/2018 01/29/2018  Glucose 65 - 99 mg/dL 80 79 73  BUN 6 - 24 mg/dL _1 Creatinine 0.57 - 1.00 mg/dL 0.54(L) 0.53(L) 0.52(L)  Sodium 134 - 144 mmol/L 143 141 144  Potassium 3.5 - 5.2 mmol/L 3.7 3.4(L) 3.7  Chloride 96 - 106 mmol/L 108(H) 101 104  CO2 20 - 29 mmol/L _2 Calcium 8.7 - 10.2 mg/dL 9.2 9.7 9.6  Total Protein 6.0 - 8.5 g/dL 6.7 6.4 6.4  Total Bilirubin 0.0 - 1.2 mg/dL 0.5 0.4 0.5  Alkaline Phos 39 - 117 IU/L 119(H) 116 103  AST 0 - 40 IU/L _3 ALT 0 - 32 IU/L 26 33(H) 52(H)    Lipid Panel     Component Value Date/Time    CHOL 193 07/15/2019 1141   CHOL 167 03/18/2012 0522   TRIG 63 07/15/2019 1141   TRIG 58 03/18/2012 0522   HDL 56 07/15/2019 1141   HDL 60 03/18/2012 0522   CHOLHDL 3.4 07/15/2019 1141   CHOLHDL 3.4 05/04/2016 1004   VLDL 18 05/04/2016 1004   VLDL 12 03/18/2012 0522  LDLCALC 125 (H) 07/15/2019 1141   LDLCALC 95 03/18/2012 0522    CBC    Component Value Date/Time   WBC 6.4 11/14/2016 1400   RBC 4.64 11/14/2016 1400   HGB 12.2 06/29/2017 0142   HGB 11.9 (L) 05/19/2013 2105   HCT 36.0 06/29/2017 0142   HCT 35.4 05/19/2013 2105   PLT 294 11/14/2016 1400   PLT 285 05/19/2013 2105   MCV 83.2 11/14/2016 1400   MCV 81 05/19/2013 2105   MCH 27.2 11/14/2016 1400   MCHC 32.6 11/14/2016 1400   RDW 14.2 11/14/2016 1400   RDW 13.8 05/19/2013 2105   LYMPHSABS 2,048 11/14/2016 1400   LYMPHSABS 1.1 06/04/2012 1136   LYMPHSABS 1.4 03/17/2012 0549   MONOABS 448 11/14/2016 1400   MONOABS 0.3 03/17/2012 0549   EOSABS 128 11/14/2016 1400   EOSABS 0.0 06/04/2012 1136   EOSABS 0.1 03/17/2012 0549   BASOSABS 64 11/14/2016 1400   BASOSABS 0.0 06/04/2012 1136   BASOSABS 0.0 03/17/2012 0549    Lab Results  Component Value Date   HGBA1C 5.8 12/13/2018    Assessment & Plan:   1. Annual physical exam Counseled on 150 minutes of exercise per week, healthy eating (including decreased daily intake of saturated fats, cholesterol, added sugars, sodium), STI prevention, routine healthcare maintenance. - Flu Vaccine QUAD 36+ mos IM - Comprehensive metabolic panel - Lipid panel - CMP14+EGFR  2. Other fatigue - VITAMIN D 25 Hydroxy (Vit-D Deficiency, Fractures)  3. Encounter for screening mammogram for malignant neoplasm of breast - MM DIGITAL SCREENING BILATERAL; Future  4. Mild persistent asthma without complication Stable - budesonide-formoterol (SYMBICORT) 160-4.5 MCG/ACT inhaler; Inhale 2 puffs into the lungs 2 (two) times daily.  Dispense: 1 Inhaler; Refill: 6  5. Sinus headache  - fluticasone (FLONASE) 50 MCG/ACT nasal spray; Place 2 sprays into both nostrils daily.  Dispense: 16 g; Refill: 1  6. Essential hypertension Controlled - Blood Pressure KIT; Use as instructed Dx: Hypertension  Dispense: 1 kit; Refill: 0 - metoprolol succinate (TOPROL-XL) 25 MG 24 hr tablet; TAKE 1/2 TABLET(12.5 MG) BY MOUTH DAILY.  Dispense: 15 tablet; Refill: 6   Meds ordered this encounter  Medications  . Blood Pressure KIT    Sig: Use as instructed Dx: Hypertension    Dispense:  1 kit    Refill:  0  . budesonide-formoterol (SYMBICORT) 160-4.5 MCG/ACT inhaler    Sig: Inhale 2 puffs into the lungs 2 (two) times daily.    Dispense:  1 Inhaler    Refill:  6  . fluticasone (FLONASE) 50 MCG/ACT nasal spray    Sig: Place 2 sprays into both nostrils daily.    Dispense:  16 g    Refill:  1  . metoprolol succinate (TOPROL-XL) 25 MG 24 hr tablet    Sig: TAKE 1/2 TABLET(12.5 MG) BY MOUTH DAILY.    Dispense:  15 tablet    Refill:  6    Follow-up: Return in about 6 months (around 01/13/2020) for medical conditions -virtual.       Charlott Rakes, MD, FAAFP. [facility] and [place], [place]   07/16/2019, 1:16 PM

## 2019-07-18 ENCOUNTER — Telehealth: Payer: Self-pay

## 2019-07-18 NOTE — Telephone Encounter (Signed)
Patient was called and a voicemail was left informing patient to return phone call for lab results. 

## 2019-07-18 NOTE — Telephone Encounter (Signed)
-----   Message from Enobong Newlin, MD sent at 07/16/2019  5:55 PM EDT ----- Labs reveal normal cholesterol, low vitamin D and I have sent a prescription for vitamin D replacement to the pharmacy.  Other labs are stable 

## 2019-07-23 ENCOUNTER — Telehealth: Payer: Self-pay

## 2019-07-23 NOTE — Telephone Encounter (Signed)
-----   Message from Charlott Rakes, MD sent at 07/16/2019  5:55 PM EDT ----- Labs reveal normal cholesterol, low vitamin D and I have sent a prescription for vitamin D replacement to the pharmacy.  Other labs are stable

## 2019-07-23 NOTE — Telephone Encounter (Signed)
Patient was called and a voicemail was left informing patient to return phone call for lab results. 

## 2019-08-28 ENCOUNTER — Ambulatory Visit: Payer: Medicaid Other

## 2019-09-23 ENCOUNTER — Other Ambulatory Visit (HOSPITAL_COMMUNITY)
Admission: RE | Admit: 2019-09-23 | Discharge: 2019-09-23 | Disposition: A | Discharge status: Home / Self Care | Payer: Medicaid Other | Source: Ambulatory Visit | Attending: Family Medicine | Admitting: Family Medicine

## 2019-09-23 ENCOUNTER — Ambulatory Visit: Payer: Medicaid Other | Attending: Family Medicine | Admitting: Family Medicine

## 2019-09-23 ENCOUNTER — Encounter: Payer: Self-pay | Admitting: Family Medicine

## 2019-09-23 ENCOUNTER — Other Ambulatory Visit: Payer: Self-pay

## 2019-09-23 VITALS — BP 155/82 | HR 75 | Temp 98.2°F | Ht 64.0 in | Wt 227.0 lb

## 2019-09-23 DIAGNOSIS — N898 Other specified noninflammatory disorders of vagina: Secondary | ICD-10-CM | POA: Diagnosis present

## 2019-09-23 DIAGNOSIS — M25561 Pain in right knee: Secondary | ICD-10-CM | POA: Diagnosis present

## 2019-09-23 DIAGNOSIS — I10 Essential (primary) hypertension: Secondary | ICD-10-CM | POA: Diagnosis not present

## 2019-09-23 DIAGNOSIS — Z79899 Other long term (current) drug therapy: Secondary | ICD-10-CM | POA: Insufficient documentation

## 2019-09-23 DIAGNOSIS — K219 Gastro-esophageal reflux disease without esophagitis: Secondary | ICD-10-CM | POA: Insufficient documentation

## 2019-09-23 DIAGNOSIS — H918X3 Other specified hearing loss, bilateral: Secondary | ICD-10-CM | POA: Insufficient documentation

## 2019-09-23 DIAGNOSIS — J45909 Unspecified asthma, uncomplicated: Secondary | ICD-10-CM | POA: Diagnosis not present

## 2019-09-23 DIAGNOSIS — Z7901 Long term (current) use of anticoagulants: Secondary | ICD-10-CM | POA: Insufficient documentation

## 2019-09-23 DIAGNOSIS — G8929 Other chronic pain: Secondary | ICD-10-CM | POA: Insufficient documentation

## 2019-09-23 DIAGNOSIS — G4730 Sleep apnea, unspecified: Secondary | ICD-10-CM | POA: Diagnosis not present

## 2019-09-23 DIAGNOSIS — M1990 Unspecified osteoarthritis, unspecified site: Secondary | ICD-10-CM | POA: Diagnosis not present

## 2019-09-23 MED ORDER — MELOXICAM 7.5 MG PO TABS: 7.5000 mg | ORAL_TABLET | Freq: Every day | ORAL | 1 refills | Status: DC

## 2019-09-23 NOTE — Patient Instructions (Signed)
Acute Knee Pain, Adult °Acute knee pain is sudden and may be caused by damage, swelling, or irritation of the muscles and tissues that support your knee. The injury may result from: °· A fall. °· An injury to your knee from twisting motions. °· A hit to the knee. °· Infection. °Acute knee pain may go away on its own with time and rest. If it does not, your health care provider may order tests to find the cause of the pain. These may include: °· Imaging tests, such as an X-ray, MRI, or ultrasound. °· Joint aspiration. In this test, fluid is removed from the knee. °· Arthroscopy. In this test, a lighted tube is inserted into the knee and an image is projected onto a TV screen. °· Biopsy. In this test, a sample of tissue is removed from the body and studied under a microscope. °Follow these instructions at home: °Pay attention to any changes in your symptoms. Take these actions to relieve your pain. °If you have a knee sleeve or brace: ° °· Wear the sleeve or brace as told by your health care provider. Remove it only as told by your health care provider. °· Loosen the sleeve or brace if your toes tingle, become numb, or turn cold and blue. °· Keep the sleeve or brace clean. °· If the sleeve or brace is not waterproof: °? Do not let it get wet. °? Cover it with a watertight covering when you take a bath or shower. °Activity °· Rest your knee. °· Do not do things that cause pain or make pain worse. °· Avoid high-impact activities or exercises, such as running, jumping rope, or doing jumping jacks. °· Work with a physical therapist to make a safe exercise program, as recommended by your health care provider. Do exercises as told by your physical therapist. °Managing pain, stiffness, and swelling ° °· If directed, put ice on the knee: °? Put ice in a plastic bag. °? Place a towel between your skin and the bag. °? Leave the ice on for 20 minutes, 2-3 times a day. °· If directed, use an elastic bandage to put pressure  (compression) on your injured knee. This may control swelling, give support, and help with discomfort. °General instructions °· Take over-the-counter and prescription medicines only as told by your health care provider. °· Raise (elevate) your knee above the level of your heart when you are sitting or lying down. °· Sleep with a pillow under your knee. °· Do not use any products that contain nicotine or tobacco, such as cigarettes, e-cigarettes, and chewing tobacco. These can delay healing. If you need help quitting, ask your health care provider. °· If you are overweight, work with your health care provider and a dietitian to set a weight-loss goal that is healthy and reasonable for you. Extra weight can put pressure on your knee. °· Keep all follow-up visits as told by your health care provider. This is important. °Contact a health care provider if: °· Your knee pain continues, changes, or gets worse. °· You have a fever along with knee pain. °· Your knee feels warm to the touch. °· Your knee buckles or locks up. °Get help right away if: °· Your knee swells, and the swelling becomes worse. °· You cannot move your knee. °· You have severe pain in your knee. °Summary °· Acute knee pain can be caused by a fall, an injury, an infection, or damage, swelling, or irritation of the tissues that support your knee. °·   Your health care provider may perform tests to find out the cause of the pain. °· Pay attention to any changes in your symptoms. Relieve your pain with rest, medicines, light activity, and use of ice. °· Get help if your pain continues or becomes worse, your knee swells, or you cannot move your knee. °This information is not intended to replace advice given to you by your health care provider. Make sure you discuss any questions you have with your health care provider. °Document Released: 07/24/2007 Document Revised: 03/08/2018 Document Reviewed: 03/08/2018 °Elsevier Patient Education © 2020 [employer] ° °

## 2019-09-23 NOTE — Progress Notes (Signed)
Subjective:  Patient ID: [name], female    DOB: [birth year]  Age: 53 y.o. MRN: [id number]  CC: Knee Pain (right knee )   HPI [name] is a 53 year old female with a history of Hypertension, Asthma, GERD and low back pain here with her disability advocate - Sharyn Lull as 'the patient forgets a lot'.  She has a knot on her R knee that is present all the time and is associated with pain.  Of note she received a right cortisone injection 10 months ago in her right knee.  Currently does not take any analgesics and rates pain as a 10/10. Also has  "scent that I don't like" and some vaginal  discharge which is milky, denies itching, not sexually active. Her disability advocate also adds that she needs to have her hearing checked as she has noticed some hearing loss.  Past Medical History:  Diagnosis Date  . Asthma   . Gastric reflux   . History of blood transfusion yrs agio, none recent  . Hypertension   . Osteoarthritis   . Sleep apnea    cpap machine has not used since 2014 due to machine/tubing in bad shape   . Syncope and collapse yrs ago    Past Surgical History:  Procedure Laterality Date  . CARDIAC CATHETERIZATION     more than 5 years ago  . CARDIAC CATHETERIZATION  06-07-2012   [facility]: Normal coronary arteries. No significant LVOT/aortic valve gradient  . CHOLECYSTECTOMY    . COLONOSCOPY WITH PROPOFOL N/A 02/27/2014   Procedure: COLONOSCOPY WITH PROPOFOL;  Surgeon: Garlan Fair, MD;  Location: [facility];  Service: Endoscopy;  Laterality: N/A;  . ESOPHAGOGASTRODUODENOSCOPY (EGD) WITH PROPOFOL N/A 02/27/2014   Procedure: ESOPHAGOGASTRODUODENOSCOPY (EGD) WITH PROPOFOL;  Surgeon: Garlan Fair, MD;  Location: [facility];  Service: Endoscopy;  Laterality: N/A;  . EXPLORATORY LAPAROTOMY  09/2001   ELAP, LOA, R partial oophorectomy, repair of bowel injury  . VAGINAL HYSTERECTOMY  2001   Adnexa left in place    No family history on file.  Allergies  Allergen  Reactions  . Tramadol Hives, Itching and Swelling    Pt states after medication intake she had to go to the ER.     Outpatient Medications Prior to Visit  Medication Sig Dispense Refill  . albuterol (PROVENTIL HFA;VENTOLIN HFA) 108 (90 Base) MCG/ACT inhaler Inhale 2 puffs into the lungs 2 (two) times daily. 1 Inhaler 6  . Blood Pressure KIT Use as instructed Dx: Hypertension 1 kit 0  . budesonide-formoterol (SYMBICORT) 160-4.5 MCG/ACT inhaler Inhale 2 puffs into the lungs 2 (two) times daily. 1 Inhaler 6  . cetirizine (ZYRTEC) 10 MG tablet Take 1 tablet (10 mg total) by mouth daily. 30 tablet 2  . ergocalciferol (DRISDOL) 1.25 MG (50000 UT) capsule Take 1 capsule (50,000 Units total) by mouth once a week. 9 capsule 1  . metoprolol succinate (TOPROL-XL) 25 MG 24 hr tablet TAKE 1/2 TABLET(12.5 MG) BY MOUTH DAILY. 15 tablet 6  . sertraline (ZOLOFT) 50 MG tablet TK 1 T PO IN THE MORNING  0  . fluticasone (FLONASE) 50 MCG/ACT nasal spray Place 2 sprays into both nostrils daily. (Patient not taking: Reported on 09/23/2019) 16 g 1  . omeprazole (PRILOSEC) 20 MG capsule Take 2 capsules (40 mg total) by mouth daily for 30 days. 30 capsule 1  . tiZANidine (ZANAFLEX) 4 MG tablet Take 1 tablet (4 mg total) by mouth every 8 (eight) hours as needed for muscle spasms. (  Patient not taking: Reported on 09/23/2019) 60 tablet 2   No facility-administered medications prior to visit.     ROS Review of Systems  Constitutional: Negative for activity change, appetite change and fatigue.  HENT: Negative for congestion, sinus pressure and sore throat.   Eyes: Negative for visual disturbance.  Respiratory: Negative for cough, chest tightness, shortness of breath and wheezing.   Cardiovascular: Negative for chest pain and palpitations.  Gastrointestinal: Negative for abdominal distention, abdominal pain and constipation.  Endocrine: Negative for polydipsia.  Genitourinary: Negative for dysuria and frequency.    Musculoskeletal: Negative for arthralgias and back pain.       See HPI  Skin: Negative for rash.  Neurological: Negative for tremors, light-headedness and numbness.  Hematological: Does not bruise/bleed easily.  Psychiatric/Behavioral: Negative for agitation and behavioral problems.    Objective:  BP (!) 155/82   Pulse 75   Temp 98.2 F (36.8 C) (Oral)   Ht '5\' 4"'$  (1.626 m)   Wt 227 lb (103 kg)   SpO2 96%   BMI 38.96 kg/m   BP/Weight 09/23/2019 89/12/7340 05/16/6810  Systolic BP 572 620 355  Diastolic BP 82 77 74  Wt. (Lbs) 227 224 234  BMI 38.96 38.45 40.17      Physical Exam Constitutional:      Appearance: She is well-developed.  HENT:     Right Ear: Tympanic membrane normal.     Left Ear: Tympanic membrane normal.  Neck:     Vascular: No JVD.  Cardiovascular:     Rate and Rhythm: Normal rate.     Heart sounds: Normal heart sounds. No murmur.  Pulmonary:     Effort: Pulmonary effort is normal.     Breath sounds: Normal breath sounds. No wheezing or rales.  Chest:     Chest wall: No tenderness.  Abdominal:     General: Bowel sounds are normal. There is no distension.     Palpations: Abdomen is soft. There is no mass.     Tenderness: There is no abdominal tenderness.  Musculoskeletal:        General: Normal range of motion.     Right lower leg: No edema.     Left lower leg: No edema.     Comments: Bony protrusion prominent on anterior aspect of right flexed knee with associated tenderness to palpation No edema of right knee Left knee is normal  Neurological:     Mental Status: She is alert and oriented to person, place, and time.  Psychiatric:        Mood and Affect: Mood normal.     CMP Latest Ref Rng & Units 07/15/2019 12/13/2018 01/29/2018  Glucose 65 - 99 mg/dL 80 79 73  BUN 6 - 24 mg/dL '9 11 14  '$ Creatinine 0.57 - 1.00 mg/dL 0.54(L) 0.53(L) 0.52(L)  Sodium 134 - 144 mmol/L 143 141 144  Potassium 3.5 - 5.2 mmol/L 3.7 3.4(L) 3.7  Chloride 96 - 106  mmol/L 108(H) 101 104  CO2 20 - 29 mmol/L '25 25 26  '$ Calcium 8.7 - 10.2 mg/dL 9.2 9.7 9.6  Total Protein 6.0 - 8.5 g/dL 6.7 6.4 6.4  Total Bilirubin 0.0 - 1.2 mg/dL 0.5 0.4 0.5  Alkaline Phos 39 - 117 IU/L 119(H) 116 103  AST 0 - 40 IU/L '14 14 16  '$ ALT 0 - 32 IU/L 26 33(H) 52(H)    Lipid Panel     Component Value Date/Time   CHOL 193 07/15/2019 1141   CHOL 167  03/18/2012 0522   TRIG 63 07/15/2019 1141   TRIG 58 03/18/2012 0522   HDL 56 07/15/2019 1141   HDL 60 03/18/2012 0522   CHOLHDL 3.4 07/15/2019 1141   CHOLHDL 3.4 05/04/2016 1004   VLDL 18 05/04/2016 1004   VLDL 12 03/18/2012 0522   LDLCALC 125 (H) 07/15/2019 1141   LDLCALC 95 03/18/2012 0522    CBC    Component Value Date/Time   WBC 6.4 11/14/2016 1400   RBC 4.64 11/14/2016 1400   HGB 12.2 06/29/2017 0142   HGB 11.9 (L) 05/19/2013 2105   HCT 36.0 06/29/2017 0142   HCT 35.4 05/19/2013 2105   PLT 294 11/14/2016 1400   PLT 285 05/19/2013 2105   MCV 83.2 11/14/2016 1400   MCV 81 05/19/2013 2105   MCH 27.2 11/14/2016 1400   MCHC 32.6 11/14/2016 1400   RDW 14.2 11/14/2016 1400   RDW 13.8 05/19/2013 2105   LYMPHSABS 2,048 11/14/2016 1400   LYMPHSABS 1.1 06/04/2012 1136   LYMPHSABS 1.4 03/17/2012 0549   MONOABS 448 11/14/2016 1400   MONOABS 0.3 03/17/2012 0549   EOSABS 128 11/14/2016 1400   EOSABS 0.0 06/04/2012 1136   EOSABS 0.1 03/17/2012 0549   BASOSABS 64 11/14/2016 1400   BASOSABS 0.0 06/04/2012 1136   BASOSABS 0.0 03/17/2012 0549    Lab Results  Component Value Date   HGBA1C 5.8 12/13/2018    Assessment & Plan:  1. Vaginal discharge - Cervicovaginal ancillary only  2. Other specified hearing loss of both ears Absence of cerumen on exam - Ambulatory referral to Audiology  3. Chronic pain of right knee Weight loss will be beneficial Status post bilateral knee injection in 11/2018 Use knee brace Will initiate NSAID - meloxicam (MOBIC) 7.5 MG tablet; Take 1 tablet (7.5 mg total) by mouth daily.   Dispense: 30 tablet; Refill: 1   Health Care Maintenance: Up-to-date on Pap smear; mammogram ordered at last visit  No orders of the defined types were placed in this encounter.   Follow-up: No follow-ups on file.       Charlott Rakes, MD, FAAFP. [facility] and [facility], [place]   09/23/2019, 2:14 PM

## 2019-09-23 NOTE — Progress Notes (Signed)
Patient is having pain in right knee.  Patient states that she has knot on her knee that comes and goes.  Patient is having vaginal discharge with no burning while urination.

## 2019-09-26 LAB — CERVICOVAGINAL ANCILLARY ONLY
Bacterial Vaginitis (gardnerella): NEGATIVE
Candida Glabrata: NEGATIVE
Candida Vaginitis: NEGATIVE
Chlamydia: NEGATIVE
Comment: NEGATIVE
Comment: NEGATIVE
Comment: NEGATIVE
Comment: NEGATIVE
Comment: NEGATIVE
Comment: NORMAL
Neisseria Gonorrhea: NEGATIVE
Trichomonas: POSITIVE — AB

## 2019-09-27 ENCOUNTER — Other Ambulatory Visit: Payer: Self-pay | Admitting: Family Medicine

## 2019-09-27 MED ORDER — METRONIDAZOLE 500 MG PO TABS: 500.0000 mg | ORAL_TABLET | Freq: Two times a day (BID) | ORAL | 0 refills | Status: DC

## 2019-10-21 ENCOUNTER — Other Ambulatory Visit (HOSPITAL_COMMUNITY)
Admission: RE | Admit: 2019-10-21 | Discharge: 2019-10-21 | Disposition: A | Discharge status: Home / Self Care | Payer: Medicaid Other | Source: Ambulatory Visit | Attending: Family | Admitting: Family

## 2019-10-21 ENCOUNTER — Ambulatory Visit: Payer: Medicaid Other | Attending: Family Medicine | Admitting: Family

## 2019-10-21 ENCOUNTER — Other Ambulatory Visit: Payer: Self-pay

## 2019-10-21 DIAGNOSIS — J45909 Unspecified asthma, uncomplicated: Secondary | ICD-10-CM | POA: Insufficient documentation

## 2019-10-21 DIAGNOSIS — G4730 Sleep apnea, unspecified: Secondary | ICD-10-CM | POA: Diagnosis not present

## 2019-10-21 DIAGNOSIS — M1990 Unspecified osteoarthritis, unspecified site: Secondary | ICD-10-CM | POA: Diagnosis not present

## 2019-10-21 DIAGNOSIS — A599 Trichomoniasis, unspecified: Secondary | ICD-10-CM | POA: Diagnosis present

## 2019-10-21 DIAGNOSIS — K219 Gastro-esophageal reflux disease without esophagitis: Secondary | ICD-10-CM | POA: Insufficient documentation

## 2019-10-21 DIAGNOSIS — Z79899 Other long term (current) drug therapy: Secondary | ICD-10-CM | POA: Insufficient documentation

## 2019-10-21 DIAGNOSIS — A5909 Other urogenital trichomoniasis: Secondary | ICD-10-CM | POA: Diagnosis present

## 2019-10-21 DIAGNOSIS — Z7951 Long term (current) use of inhaled steroids: Secondary | ICD-10-CM | POA: Diagnosis not present

## 2019-10-21 DIAGNOSIS — I10 Essential (primary) hypertension: Secondary | ICD-10-CM | POA: Diagnosis not present

## 2019-10-21 NOTE — Progress Notes (Signed)
Pt is requesting a recheck for std

## 2019-10-22 NOTE — Progress Notes (Signed)
Virtual Visit via Telephone Note  I connected with [name], on 10/21/2019 at 3:15 PM by telephone due to the COVID-19 pandemic and verified that I am speaking with the correct person using two identifiers.   Consent: I discussed the limitations, risks, security and privacy concerns of performing an evaluation and management service by telephone and the availability of in person appointments. I also discussed with the patient that there may be a patient responsible charge related to this service. The patient expressed understanding and agreed to proceed.  Due to current restrictions/limitations of in-office visits due to the COVID-19 pandemic, this scheduled clinical appointment was converted to a telehealth visit.  Location of Patient: Home  Location of Provider: Clinic  Persons participating in Telemedicine visit: Kenley Rettinger, [place] Durene Fruits, NP  History of Present Illness: Patient following-up on complaint of vaginal discharge in December 2020. Cervicovaginal ancillary testing was completed and resulted positive for trichomoniasis September 23, 2019. Prescribed Metronidazole (FLAGYL); take 1 tablet (500 mg total) by mouth 2 (two) times daily. Completed medication without missing any doses. Denies vaginal discharge. Denies itching. Denies perineum rash. Denies burning sensation. Denies painful urination. Denies low back pain. Denies low abdominal pain. Denies malodor. Denies being sexually after. Unsure if her partner was tested. Presented to the office on today for repeat cervicovaginal ancillary testing.    Past Medical History:  Diagnosis Date  . Asthma   . Gastric reflux   . History of blood transfusion yrs agio, none recent  . Hypertension   . Osteoarthritis   . Sleep apnea    cpap machine has not used since 2014 due to machine/tubing in bad shape   . Syncope and collapse yrs ago   Allergies  Allergen Reactions  . Tramadol Hives, Itching and Swelling     Pt states after medication intake she had to go to the ER.     Current Outpatient Medications on File Prior to Visit  Medication Sig Dispense Refill  . albuterol (PROVENTIL HFA;VENTOLIN HFA) 108 (90 Base) MCG/ACT inhaler Inhale 2 puffs into the lungs 2 (two) times daily. 1 Inhaler 6  . Blood Pressure KIT Use as instructed Dx: Hypertension 1 kit 0  . budesonide-formoterol (SYMBICORT) 160-4.5 MCG/ACT inhaler Inhale 2 puffs into the lungs 2 (two) times daily. 1 Inhaler 6  . cetirizine (ZYRTEC) 10 MG tablet Take 1 tablet (10 mg total) by mouth daily. 30 tablet 2  . ergocalciferol (DRISDOL) 1.25 MG (50000 UT) capsule Take 1 capsule (50,000 Units total) by mouth once a week. 9 capsule 1  . fluticasone (FLONASE) 50 MCG/ACT nasal spray Place 2 sprays into both nostrils daily. (Patient not taking: Reported on 09/23/2019) 16 g 1  . meloxicam (MOBIC) 7.5 MG tablet Take 1 tablet (7.5 mg total) by mouth daily. 30 tablet 1  . metoprolol succinate (TOPROL-XL) 25 MG 24 hr tablet TAKE 1/2 TABLET(12.5 MG) BY MOUTH DAILY. 15 tablet 6  . metroNIDAZOLE (FLAGYL) 500 MG tablet Take 1 tablet (500 mg total) by mouth 2 (two) times daily. (Patient not taking: Reported on 10/21/2019) 14 tablet 0  . omeprazole (PRILOSEC) 20 MG capsule Take 2 capsules (40 mg total) by mouth daily for 30 days. 30 capsule 1  . sertraline (ZOLOFT) 50 MG tablet TK 1 T PO IN THE MORNING  0  . tiZANidine (ZANAFLEX) 4 MG tablet Take 1 tablet (4 mg total) by mouth every 8 (eight) hours as needed for muscle spasms. (Patient not taking: Reported on 09/23/2019) 60  tablet 2   No current facility-administered medications on file prior to visit.    Observations/Objective: Alert and oriented x 3. Not in acute distress.  Assessment and Plan: 1. Trichomoniasis:   Repeat cervicovaginal testing today. Results pending.  Follow Up Instructions: Follow-up with attending physician Dr. Margarita Rana if symptoms return.   I discussed the assessment and  treatment plan with the patient. The patient was provided an opportunity to ask questions and all were answered. The patient agreed with the plan and demonstrated an understanding of the instructions.   The patient was advised to call back or seek an in-person evaluation if the symptoms worsen or if the condition fails to improve as anticipated.     I provided 15 minutes total of non-face-to-face time during this encounter including median intraservice time, reviewing previous notes, labs, imaging, medications, management and patient verbalized understanding.    Camillia Herter, NP  [facility] [place], [place]   10/22/2019, 9:19 AM

## 2019-10-23 ENCOUNTER — Other Ambulatory Visit: Payer: Self-pay

## 2019-10-23 ENCOUNTER — Ambulatory Visit: Payer: Medicaid Other | Attending: Family Medicine | Admitting: Audiology

## 2019-10-23 DIAGNOSIS — H9313 Tinnitus, bilateral: Secondary | ICD-10-CM | POA: Diagnosis present

## 2019-10-23 DIAGNOSIS — H9041 Sensorineural hearing loss, unilateral, right ear, with unrestricted hearing on the contralateral side: Secondary | ICD-10-CM | POA: Diagnosis not present

## 2019-10-23 LAB — CERVICOVAGINAL ANCILLARY ONLY
Bacterial Vaginitis (gardnerella): NEGATIVE
Candida Glabrata: NEGATIVE
Candida Vaginitis: NEGATIVE
Chlamydia: NEGATIVE
Comment: NEGATIVE
Comment: NEGATIVE
Comment: NEGATIVE
Comment: NEGATIVE
Comment: NEGATIVE
Comment: NORMAL
Neisseria Gonorrhea: NEGATIVE
Trichomonas: POSITIVE — AB

## 2019-10-23 NOTE — Procedures (Signed)
  Outpatient Audiology and [facility] [address] 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 She reports increased difficulty hearing on her cell phone. [name] reports intermittent tinnitus and otalgia. She denies dizziness and aural fullness.   Evaluation:   Otoscopy showed a clear view of the tympanic membranes, bilaterally  Tympanometry results were consistent with normal middle ear function in the left ear and reduced tympanic membrane mobility, in the right ear.   Ipsilateral Acoustic Reflex Thresholds (ARTs) were present and within the normal range, bilaterally.  Audiometric testing was completed using Conventional Audiometry techniques with insert earphones and TDH headphones. Results are consistent with normal hearing sensitivity, bilaterally, with the exception of a mild sensorineural hearing loss at 8000 Hz in the right ear. Asymmetry noted at 8000 Hz, worse in the right ear. A Speech Recognition Threshold (SRT) was obtained at 15 dB HL, bilaterally. Word recognition testing was completed at 70 dB HL masked and [name] scored 100%, bilaterally.   Results:  Normal hearing sensitivity with the exception of a mild sensorineural hearing loss at 8000 Hz in the right ear. [name] will have communication difficulties in adverse listening situations. She will benefit from the use of good communication strategies. [name] was given a handout on effective communication strategies and a handout on tips for talking to  individuals with hearing loss. The test results and recommendations were reviewed with [facility] and her care giver.   Recommendations: 1.   Monitor Hearing Sensitivity in 2-3 years    [employer], Au.D., CCC-A

## 2019-10-23 NOTE — Patient Instructions (Signed)
Outpatient Audiology and [facility] [address] [place], [place]  [address] [phone] __________________________________________________________________________________________________________  Tips for Talking to Hard of Hearing Persons   1. Face the hard of hearing person directly.  2. Lighting should be directed on the speaker's face.  3. Avoid talking from another room.  4. Be aware that anyone will have more difficulty concentrating when fatigued or ill.  5. Speak naturally. It is more important to speak more slowly rather than more loudly.  6. Keep your hands away from your mouth while talking.  7. If you are eating, chewing, smoking, smiling, while talking, your speech will be more difficult to understand.  8. If a person has difficulty understanding some particular phrase or word, try to find a different way of saying the same thing; rephrase, rather than repeat the original words.  9. Avoid using sentences that go on too long. Slow down, and wait to make sure that you have been understood before continuing.  10. If you are giving specific information, such as time or place, ask the hard of hearing person to repeat what you said.  11. Avoid sudden changes of topic.  12. Don't drop your voice at the end of sentences.       Outpatient Audiology and [facility] [address] [place], [place]  [address] [phone] __________________________________________________________________________________________________________  Suggestions for Effective Communication   A. Pay Attention to Your Environment 1. Try to keep background noise to a minimum. 2. Position yourself within six to ten feet and where you can see the speaker's face. If you have a better-hearing ear, try to keep it towards the speaker. 3. Let the speaker know you have a hearing loss and that you will understand the conversation better if he or she is facing  you. 4. Ask the person to speak clearly and distinctly. Overly loud or exaggerated speech is not easier to understand. 5. Remind your family and friends to be sure that they have your attention before speaking to you. Ask them to be in the same room with you and not to shout across a long distance.   B. Be Alert and Develop Good Listening Habits 1. Use your hearing aid. Sometimes you may not notice how much it helps, but others will. 2. Follow along with the speaker and as you become familiar with the rhythm of his or her speech, you will pick up key words that will allow you to make good guesses about parts of the conversation you have missed. Use the following suggestions if you are still not understanding: a) Restate what you heard to eliminate misunderstandings. b) When you did not hear enough to use a key word and the person has repeated the message, ask him or her to say the same thing again using different words. c) If you don't understand a word or a name, even after repetition, ask the speaker to spell it or write it down. d) In group situations, position yourself so that you can see everyone and are not too far away. 3. Be Realistic! You might not always be sure of what was said; but, by using some of these suggestions you should do better. No one, even those with normal hearing, hears everything all the time.   C. Tips for Difficult Listening Situations 1. Radio or television: These are difficult because you can't always see the faces of the people talking and there may be music accompanying the action. Reduce competing noises as much as possible and consider  using assistive devices or closed captioning. 2. Telephone: Most hearing aids have a feature that helps with the telephone or you can purchase a telephone amplifier. Some people use both. Your audiologist will help you decide which arrangement is best for you. 3. Public places (such as house of worship, theaters, etc.): For  single-speaker situations, try to sit about six rows from the front and in line with the speaker's face. At entertainment venues, try to sit close enough to the screen or stage to optimize visual cues. Avoid sitting near noisy children and try to avoid sitting under a balcony. Assistive listening devices should be available.

## 2019-10-24 MED ORDER — METRONIDAZOLE 500 MG PO TABS: 2000.0000 mg | ORAL_TABLET | Freq: Two times a day (BID) | ORAL | 0 refills | Status: DC

## 2019-10-24 NOTE — Addendum Note (Signed)
Addended by: Rema Fendt on: 10/24/2019 09:14 PM   Modules accepted: Orders

## 2019-10-25 ENCOUNTER — Telehealth: Payer: Self-pay | Admitting: Family Medicine

## 2019-10-25 MED ORDER — METRONIDAZOLE 500 MG PO TABS: 2000.0000 mg | ORAL_TABLET | Freq: Every day | ORAL | 0 refills | Status: AC

## 2019-10-25 NOTE — Telephone Encounter (Signed)
Patient called and requested for her most recent lab results. Patient was informed of most recent lab work results. Patient verbalized understanding and asked if pcp would be prescribing her any medications due to her lab results. If so please send to [facility] drug store [phone]. Patient had no further questions.

## 2019-10-25 NOTE — Addendum Note (Signed)
Addended by: Rema Fendt on: 10/25/2019 08:39 AM   Modules accepted: Orders

## 2019-10-28 ENCOUNTER — Ambulatory Visit: Payer: Medicaid Other | Admitting: Family Medicine

## 2019-10-28 ENCOUNTER — Other Ambulatory Visit: Payer: Medicaid Other

## 2019-10-30 ENCOUNTER — Other Ambulatory Visit: Payer: Medicaid Other

## 2019-10-30 NOTE — Telephone Encounter (Signed)
Patient was called and a voicemail was left informing patient to return phone call. 

## 2019-10-30 NOTE — Telephone Encounter (Signed)
Patient stats that she has picked up the medication from the pharmacy.

## 2019-11-21 ENCOUNTER — Encounter: Payer: Self-pay | Admitting: Physician Assistant

## 2019-11-21 ENCOUNTER — Other Ambulatory Visit: Payer: Self-pay

## 2019-11-21 ENCOUNTER — Ambulatory Visit (INDEPENDENT_AMBULATORY_CARE_PROVIDER_SITE_OTHER): Payer: Medicaid Other | Admitting: Physician Assistant

## 2019-11-21 ENCOUNTER — Ambulatory Visit
Admission: EM | Admit: 2019-11-21 | Discharge: 2019-11-21 | Disposition: A | Discharge status: Home / Self Care | Payer: Medicaid Other | Attending: Family Medicine | Admitting: Family Medicine

## 2019-11-21 DIAGNOSIS — I10 Essential (primary) hypertension: Secondary | ICD-10-CM

## 2019-11-21 DIAGNOSIS — Z20822 Contact with and (suspected) exposure to covid-19: Secondary | ICD-10-CM

## 2019-11-21 DIAGNOSIS — M1711 Unilateral primary osteoarthritis, right knee: Secondary | ICD-10-CM

## 2019-11-21 DIAGNOSIS — J320 Chronic maxillary sinusitis: Secondary | ICD-10-CM

## 2019-11-21 DIAGNOSIS — R0982 Postnasal drip: Secondary | ICD-10-CM | POA: Diagnosis not present

## 2019-11-21 DIAGNOSIS — R05 Cough: Secondary | ICD-10-CM | POA: Diagnosis not present

## 2019-11-21 DIAGNOSIS — M1712 Unilateral primary osteoarthritis, left knee: Secondary | ICD-10-CM

## 2019-11-21 DIAGNOSIS — R059 Cough, unspecified: Secondary | ICD-10-CM

## 2019-11-21 MED ORDER — METHYLPREDNISOLONE ACETATE 40 MG/ML IJ SUSP
40.0000 mg | INTRAMUSCULAR | Status: AC | PRN
  Administered 2019-11-21: 40 mg via INTRA_ARTICULAR

## 2019-11-21 MED ORDER — LIDOCAINE HCL 1 % IJ SOLN
0.5000 mL | INTRAMUSCULAR | Status: AC | PRN
  Administered 2019-11-21: .5 mL

## 2019-11-21 MED ORDER — BENZONATATE 100 MG PO CAPS: 100.0000 mg | ORAL_CAPSULE | Freq: Three times a day (TID) | ORAL | 0 refills | Status: DC

## 2019-11-21 MED ORDER — AMOXICILLIN 500 MG PO CAPS: 500.0000 mg | ORAL_CAPSULE | Freq: Three times a day (TID) | ORAL | 0 refills | Status: DC

## 2019-11-21 NOTE — ED Provider Notes (Signed)
[name]    CSN: [id number] Arrival date & time: 11/21/19  1818      History   Chief Complaint Chief Complaint  Patient presents with  . Cough    HPI [name] is a 54 y.o. female.   Patient has had cough that is occasionally productive for the past week.  Also has some headache and sinus pressure and complains of postnasal drainage.  Denies any fever.  HPI  Past Medical History:  Diagnosis Date  . Asthma   . Gastric reflux   . History of blood transfusion yrs agio, none recent  . Hypertension   . Osteoarthritis   . Sleep apnea    cpap machine has not used since 2014 due to machine/tubing in bad shape   . Syncope and collapse yrs ago    Patient Active Problem List   Diagnosis Date Noted  . Obesity 01/29/2018  . Acute left-sided low back pain with left-sided sciatica 11/27/2017  . Low back pain 10/30/2017  . Itching with irritation 06/23/2016  . Gastric reflux 05/04/2016  . Asthma 05/04/2016  . Essential hypertension 07/23/2015  . Mild depression ([place]) 07/23/2015  . Chest pain 06/06/2012  . Murmur, cardiac 06/06/2012    Past Surgical History:  Procedure Laterality Date  . CARDIAC CATHETERIZATION     more than 5 years ago  . CARDIAC CATHETERIZATION  06-07-2012   [facility]: Normal coronary arteries. No significant LVOT/aortic valve gradient  . CHOLECYSTECTOMY    . COLONOSCOPY WITH PROPOFOL N/A 02/27/2014   Procedure: COLONOSCOPY WITH PROPOFOL;  Surgeon: Garlan Fair, MD;  Location: [facility];  Service: Endoscopy;  Laterality: N/A;  . ESOPHAGOGASTRODUODENOSCOPY (EGD) WITH PROPOFOL N/A 02/27/2014   Procedure: ESOPHAGOGASTRODUODENOSCOPY (EGD) WITH PROPOFOL;  Surgeon: Garlan Fair, MD;  Location: [facility];  Service: Endoscopy;  Laterality: N/A;  . EXPLORATORY LAPAROTOMY  09/2001   ELAP, LOA, R partial oophorectomy, repair of bowel injury  . VAGINAL HYSTERECTOMY  2001   Adnexa left in place    OB History    Gravida  2   Para  2     Term  2   Preterm      AB      Living  2     SAB      TAB      Ectopic      Multiple      Live Births               Home Medications    Prior to Admission medications   Medication Sig Start Date End Date Taking? Authorizing Provider  albuterol (PROVENTIL HFA;VENTOLIN HFA) 108 (90 Base) MCG/ACT inhaler Inhale 2 puffs into the lungs 2 (two) times daily. 12/13/18   Charlott Rakes, MD  Blood Pressure KIT Use as instructed Dx: Hypertension 07/15/19   Charlott Rakes, MD  budesonide-formoterol (SYMBICORT) 160-4.5 MCG/ACT inhaler Inhale 2 puffs into the lungs 2 (two) times daily. 07/15/19   Charlott Rakes, MD  cetirizine (ZYRTEC) 10 MG tablet Take 1 tablet (10 mg total) by mouth daily. 01/29/18   Charlott Rakes, MD  ergocalciferol (DRISDOL) 1.25 MG (50000 UT) capsule Take 1 capsule (50,000 Units total) by mouth once a week. 07/16/19   Charlott Rakes, MD  fluticasone (FLONASE) 50 MCG/ACT nasal spray Place 2 sprays into both nostrils daily. 07/15/19   Charlott Rakes, MD  meloxicam (MOBIC) 7.5 MG tablet Take 1 tablet (7.5 mg total) by mouth daily. 09/23/19   Charlott Rakes, MD  metoprolol succinate (  TOPROL-XL) 25 MG 24 hr tablet TAKE 1/2 TABLET(12.5 MG) BY MOUTH DAILY. 07/15/19   Charlott Rakes, MD  omeprazole (PRILOSEC) 20 MG capsule Take 2 capsules (40 mg total) by mouth daily for 30 days. 12/13/18 01/12/19  Charlott Rakes, MD  sertraline (ZOLOFT) 50 MG tablet TK 1 T PO IN THE MORNING 04/27/18   [provider]  tiZANidine (ZANAFLEX) 4 MG tablet Take 1 tablet (4 mg total) by mouth every 8 (eight) hours as needed for muscle spasms. 12/13/18   Charlott Rakes, MD    Family History No family history on file.  Social History Social History   Tobacco Use  . Smoking status: Never Smoker  . Smokeless tobacco: Never Used  Substance Use Topics  . Alcohol use: No  . Drug use: No     Allergies   Tramadol   Review of Systems Review of Systems  HENT: Positive for sinus  pressure.   Respiratory: Positive for cough.   All other systems reviewed and are negative.    Physical Exam Triage Vital Signs ED Triage Vitals [11/21/19 1834]  Enc Vitals Group     BP (!) 131/91     Pulse Rate 98     Resp 18     Temp 98.9 F (37.2 C)     Temp Source Oral     SpO2 95 %     Weight      Height      Head Circumference      Peak Flow      Pain Score 0     Pain Loc      Pain Edu?      Excl. in [place]?    No data found.  Updated Vital Signs BP (!) 131/91 (BP Location: Left Arm)   Pulse 98   Temp 98.9 F (37.2 C) (Oral)   Resp 18   SpO2 95%   Visual Acuity Right Eye Distance:   Left Eye Distance:   Bilateral Distance:    Right Eye Near:   Left Eye Near:    Bilateral Near:     Physical Exam Vitals and nursing note reviewed.  Constitutional:      Appearance: Normal appearance. She is obese.  HENT:     Head: Normocephalic.     Comments: Tenderness to percussion over the frontal maxillary sinuses    Right Ear: Tympanic membrane normal.     Left Ear: Tympanic membrane normal.     Nose: Nose normal.     Mouth/Throat:     Mouth: Mucous membranes are moist.  Cardiovascular:     Rate and Rhythm: Normal rate and regular rhythm.  Pulmonary:     Effort: Pulmonary effort is normal.     Breath sounds: Normal breath sounds.  Musculoskeletal:     Cervical back: Normal range of motion.  Neurological:     General: No focal deficit present.     Mental Status: She is alert and oriented to person, place, and time.      UC Treatments / Results  Labs (all labs ordered are listed, but only abnormal results are displayed) Labs Reviewed - No data to display  EKG   Radiology No results found.  Procedures Procedures (including critical care time)  Medications Ordered in UC Medications - No data to display  Initial Impression / Assessment and Plan / UC Course  I have reviewed the triage vital signs and the nursing notes.  Pertinent labs & imaging  results that were available  during my care of the patient were reviewed by me and considered in my medical decision making (see chart for details).     Probable sinusitis with postnasal drainage cough Final Clinical Impressions(s) / UC Diagnoses   Final diagnoses:  None   Discharge Instructions   None    ED Prescriptions    None     PDMP not reviewed this encounter.   Wardell Honour, MD 11/21/19 Lurena Nida

## 2019-11-21 NOTE — Progress Notes (Signed)
Office Visit Note   Patient: [name]           Date of Birth: May 19, 1966           MRN: [id number] Visit Date: 11/21/2019              Requested by: Hoy Register, MD [address] [place],  [place] [address] PCP: Hoy Register, MD   Assessment & Plan: Visit Diagnoses:  1. Primary osteoarthritis of right knee   2. Primary osteoarthritis of left knee     Plan: She will continue to work on quad strengthening both knees.  She understands she needs to wait at least 3 months between injections.  Questions encouraged and answered.  Follow-Up Instructions: No follow-ups on file.   Orders:  No orders of the defined types were placed in this encounter.  No orders of the defined types were placed in this encounter.     Procedures: Large Joint Inj: bilateral knee on 11/21/2019 12:48 PM Indications: pain Details: 22 G 1.5 in needle, anterolateral approach  Arthrogram: No  Medications (Right): 0.5 mL lidocaine 1 %; 40 mg methylPREDNISolone acetate 40 MG/ML Medications (Left): 0.5 mL lidocaine 1 %; 40 mg methylPREDNISolone acetate 40 MG/ML Outcome: tolerated well, no immediate complications Procedure, treatment alternatives, risks and benefits explained, specific risks discussed. Consent was given by the patient. Immediately prior to procedure a time out was called to verify the correct patient, procedure, equipment, support staff and site/side marked as required. Patient was prepped and draped in the usual sterile fashion.       Clinical Data: No additional findings.   Subjective: No chief complaint on file.   HPI [name] returns today requesting injection in both knees.  She has known osteoarthritis of both knees.  Right knee is worse than the left.  She has constant aching pain in both knees with popping.  She has trouble going up and down steps.  She has had no new injury to either knee.  She states the last injections gave her relief for 2 to 3 months.   Patient is nondiabetic.  Review of Systems Denies any fevers, chills, shortness of breath or chest pain.  Objective: Vital Signs: There were no vitals taken for this visit.  Physical Exam Constitutional:      Appearance: She is not ill-appearing or diaphoretic.  Pulmonary:     Effort: Pulmonary effort is normal.  Neurological:     Mental Status: She is alert and oriented to person, place, and time.     Ortho Exam Bilateral knees full range of motion.  Significant patellofemoral crepitus bilaterally.  Tenderness along medial joint line the right knee only.  No instability with valgus varus stressing of either knee.  No abnormal warmth erythema of either knee.  Both knees slightly hyperextend. Specialty Comments:  No specialty comments available.  Imaging: No results found.   PMFS History: Patient Active Problem List   Diagnosis Date Noted  . Obesity 01/29/2018  . Acute left-sided low back pain with left-sided sciatica 11/27/2017  . Low back pain 10/30/2017  . Itching with irritation 06/23/2016  . Gastric reflux 05/04/2016  . Asthma 05/04/2016  . Essential hypertension 07/23/2015  . Mild depression ([facility]) 07/23/2015  . Chest pain 06/06/2012  . Murmur, cardiac 06/06/2012   Past Medical History:  Diagnosis Date  . Asthma   . Gastric reflux   . History of blood transfusion yrs agio, none recent  . Hypertension   . Osteoarthritis   .  Sleep apnea    cpap machine has not used since 2014 due to machine/tubing in bad shape   . Syncope and collapse yrs ago    No family history on file.  Past Surgical History:  Procedure Laterality Date  . CARDIAC CATHETERIZATION     more than 5 years ago  . CARDIAC CATHETERIZATION  06-07-2012   [facility]: Normal coronary arteries. No significant LVOT/aortic valve gradient  . CHOLECYSTECTOMY    . COLONOSCOPY WITH PROPOFOL N/A 02/27/2014   Procedure: COLONOSCOPY WITH PROPOFOL;  Surgeon: Garlan Fair, MD;  Location: [facility];  Service:  Endoscopy;  Laterality: N/A;  . ESOPHAGOGASTRODUODENOSCOPY (EGD) WITH PROPOFOL N/A 02/27/2014   Procedure: ESOPHAGOGASTRODUODENOSCOPY (EGD) WITH PROPOFOL;  Surgeon: Garlan Fair, MD;  Location: [facility];  Service: Endoscopy;  Laterality: N/A;  . EXPLORATORY LAPAROTOMY  09/2001   ELAP, LOA, R partial oophorectomy, repair of bowel injury  . VAGINAL HYSTERECTOMY  2001   Adnexa left in place   Social History   Occupational History  . Not on file  Tobacco Use  . Smoking status: Never Smoker  . Smokeless tobacco: Never Used  Substance and Sexual Activity  . Alcohol use: No  . Drug use: No  . Sexual activity: Never    Birth control/protection: Surgical

## 2019-11-21 NOTE — ED Triage Notes (Signed)
Pt c/o cough and sneezing x4 days. States unable to sleep d/t to cough.

## 2019-11-21 NOTE — Discharge Instructions (Signed)
Pick up Mucinex over-the-counter to help loosen cough

## 2019-11-24 LAB — NOVEL CORONAVIRUS, NAA: SARS-CoV-2, NAA: NOT DETECTED

## 2019-12-27 ENCOUNTER — Ambulatory Visit
Admission: RE | Admit: 2019-12-27 | Discharge: 2019-12-27 | Disposition: A | Discharge status: Home / Self Care | Payer: Medicaid Other | Source: Ambulatory Visit | Attending: Family Medicine | Admitting: Family Medicine

## 2019-12-27 ENCOUNTER — Other Ambulatory Visit: Payer: Self-pay

## 2019-12-27 DIAGNOSIS — Z1231 Encounter for screening mammogram for malignant neoplasm of breast: Secondary | ICD-10-CM

## 2020-01-02 ENCOUNTER — Telehealth: Payer: Self-pay

## 2020-01-02 NOTE — Telephone Encounter (Signed)
Patient name and DOB has been verified Patient was informed of lab results. Patient had no questions.  

## 2020-01-02 NOTE — Telephone Encounter (Signed)
-----   Message from Hoy Register, MD sent at 12/31/2019  5:59 PM EDT ----- Mammogram is negative for malignancy

## 2020-01-03 ENCOUNTER — Ambulatory Visit (INDEPENDENT_AMBULATORY_CARE_PROVIDER_SITE_OTHER): Payer: Medicaid Other

## 2020-01-03 ENCOUNTER — Ambulatory Visit
Admission: EM | Admit: 2020-01-03 | Discharge: 2020-01-03 | Disposition: A | Discharge status: Home / Self Care | Payer: Medicaid Other | Attending: Family Medicine | Admitting: Family Medicine

## 2020-01-03 DIAGNOSIS — K219 Gastro-esophageal reflux disease without esophagitis: Secondary | ICD-10-CM

## 2020-01-03 DIAGNOSIS — R05 Cough: Secondary | ICD-10-CM

## 2020-01-03 DIAGNOSIS — R0602 Shortness of breath: Secondary | ICD-10-CM

## 2020-01-03 DIAGNOSIS — R059 Cough, unspecified: Secondary | ICD-10-CM

## 2020-01-03 DIAGNOSIS — M7989 Other specified soft tissue disorders: Secondary | ICD-10-CM | POA: Diagnosis not present

## 2020-01-03 MED ORDER — OMEPRAZOLE 20 MG PO CPDR: 20.0000 mg | DELAYED_RELEASE_CAPSULE | Freq: Every day | ORAL | 1 refills | Status: DC

## 2020-01-03 MED ORDER — PREDNISONE 10 MG PO TABS: 20.0000 mg | ORAL_TABLET | Freq: Every day | ORAL | 0 refills | Status: AC

## 2020-01-03 MED ORDER — OMEPRAZOLE 20 MG PO CPDR: 40.0000 mg | DELAYED_RELEASE_CAPSULE | Freq: Every day | ORAL | 1 refills | Status: DC

## 2020-01-03 MED ORDER — PROMETHAZINE-DM 6.25-15 MG/5ML PO SYRP: 5.0000 mL | ORAL_SOLUTION | Freq: Three times a day (TID) | ORAL | 0 refills | Status: DC | PRN

## 2020-01-03 NOTE — ED Triage Notes (Signed)
Pt c/o cough, congestion and sore throat for over a month. States seen here and had a neg covid test. Pt states now having some SOB and wheezing. Pt also c/o rt ring finger swelling and tenderness since last night, denies injury.

## 2020-01-03 NOTE — ED Provider Notes (Signed)
[name]    CSN: [id number] Arrival date & time: 01/03/20  1309      History   Chief Complaint Chief Complaint  Patient presents with  . Cough    HPI [name] is a 54 y.o. female.   Patient is a 54 year old female past medical history of asthma, reflux, HTN, OA, sleep apnea. She presents today with continued cough, sore throat, mild SOB and wheezing for about 1 month. Symptoms have been waxing and waning. She has been taking zyrtec, OTC meds without much relief. Hx of seasonal allergies. She also had GERD and reports that she has been taking her omeprazole. Cough is worse at night. mildly productive. She has also bee using her albuterol inhaler. No fever, chills, body aches, loss of taste or smell. Had a negative covid test here recently. Was also treated with antibiotics and given tessalon pearls for URI.  She also has right ring finger redness and swelling since last night. No injury. No bug bites that she knows of. mildy painful with limited ROM.   ROS per HPI      Past Medical History:  Diagnosis Date  . Asthma   . Gastric reflux   . History of blood transfusion yrs agio, none recent  . Hypertension   . Osteoarthritis   . Sleep apnea    cpap machine has not used since 2014 due to machine/tubing in bad shape   . Syncope and collapse yrs ago    Patient Active Problem List   Diagnosis Date Noted  . Obesity 01/29/2018  . Acute left-sided low back pain with left-sided sciatica 11/27/2017  . Low back pain 10/30/2017  . Itching with irritation 06/23/2016  . Gastric reflux 05/04/2016  . Asthma 05/04/2016  . Essential hypertension 07/23/2015  . Mild depression ([place]) 07/23/2015  . Chest pain 06/06/2012  . Murmur, cardiac 06/06/2012    Past Surgical History:  Procedure Laterality Date  . CARDIAC CATHETERIZATION     more than 5 years ago  . CARDIAC CATHETERIZATION  06-07-2012   [facility]: Normal coronary arteries. No significant LVOT/aortic valve  gradient  . CHOLECYSTECTOMY    . COLONOSCOPY WITH PROPOFOL N/A 02/27/2014   Procedure: COLONOSCOPY WITH PROPOFOL;  Surgeon: Garlan Fair, MD;  Location: [facility];  Service: Endoscopy;  Laterality: N/A;  . ESOPHAGOGASTRODUODENOSCOPY (EGD) WITH PROPOFOL N/A 02/27/2014   Procedure: ESOPHAGOGASTRODUODENOSCOPY (EGD) WITH PROPOFOL;  Surgeon: Garlan Fair, MD;  Location: [facility];  Service: Endoscopy;  Laterality: N/A;  . EXPLORATORY LAPAROTOMY  09/2001   ELAP, LOA, R partial oophorectomy, repair of bowel injury  . VAGINAL HYSTERECTOMY  2001   Adnexa left in place    OB History    Gravida  2   Para  2   Term  2   Preterm      AB      Living  2     SAB      TAB      Ectopic      Multiple      Live Births               Home Medications    Prior to Admission medications   Medication Sig Start Date End Date Taking? Authorizing Provider  albuterol (PROVENTIL HFA;VENTOLIN HFA) 108 (90 Base) MCG/ACT inhaler Inhale 2 puffs into the lungs 2 (two) times daily. 12/13/18   Charlott Rakes, MD  Blood Pressure KIT Use as instructed Dx: Hypertension 07/15/19   Charlott Rakes, MD  budesonide-formoterol (SYMBICORT) 160-4.5 MCG/ACT inhaler Inhale 2 puffs into the lungs 2 (two) times daily. 07/15/19   Charlott Rakes, MD  cetirizine (ZYRTEC) 10 MG tablet Take 1 tablet (10 mg total) by mouth daily. 01/29/18   Charlott Rakes, MD  ergocalciferol (DRISDOL) 1.25 MG (50000 UT) capsule Take 1 capsule (50,000 Units total) by mouth once a week. 07/16/19   Charlott Rakes, MD  fluticasone (FLONASE) 50 MCG/ACT nasal spray Place 2 sprays into both nostrils daily. 07/15/19   Charlott Rakes, MD  meloxicam (MOBIC) 7.5 MG tablet Take 1 tablet (7.5 mg total) by mouth daily. 09/23/19   Charlott Rakes, MD  metoprolol succinate (TOPROL-XL) 25 MG 24 hr tablet TAKE 1/2 TABLET(12.5 MG) BY MOUTH DAILY. 07/15/19   Charlott Rakes, MD  omeprazole (PRILOSEC) 20 MG capsule Take 1 capsule (20 mg total) by  mouth daily. 01/03/20   Loura Halt A, NP  predniSONE (DELTASONE) 10 MG tablet Take 2 tablets (20 mg total) by mouth daily for 5 days. 01/03/20 01/08/20  Loura Halt A, NP  promethazine-dextromethorphan (PROMETHAZINE-DM) 6.25-15 MG/5ML syrup Take 5 mLs by mouth 3 (three) times daily as needed for cough. 01/03/20   Loura Halt A, NP  sertraline (ZOLOFT) 50 MG tablet TK 1 T PO IN THE MORNING 04/27/18   [provider]  tiZANidine (ZANAFLEX) 4 MG tablet Take 1 tablet (4 mg total) by mouth every 8 (eight) hours as needed for muscle spasms. 12/13/18   Charlott Rakes, MD    Family History No family history on file.  Social History Social History   Tobacco Use  . Smoking status: Never Smoker  . Smokeless tobacco: Never Used  Substance Use Topics  . Alcohol use: No  . Drug use: No     Allergies   Tramadol   Review of Systems Review of Systems   Physical Exam Triage Vital Signs ED Triage Vitals [01/03/20 1324]  Enc Vitals Group     BP 128/84     Pulse Rate 97     Resp 20     Temp 100 F (37.8 C)     Temp Source Oral     SpO2 96 %     Weight      Height      Head Circumference      Peak Flow      Pain Score      Pain Loc      Pain Edu?      Excl. in [place]?    No data found.  Updated Vital Signs BP 128/84 (BP Location: Left Arm)   Pulse 97   Temp 100 F (37.8 C) (Oral)   Resp 20   SpO2 96%   Visual Acuity Right Eye Distance:   Left Eye Distance:   Bilateral Distance:    Right Eye Near:   Left Eye Near:    Bilateral Near:     Physical Exam Vitals and nursing note reviewed.  Constitutional:      General: She is not in acute distress.    Appearance: Normal appearance. She is not ill-appearing, toxic-appearing or diaphoretic.  HENT:     Head: Normocephalic.     Right Ear: Tympanic membrane and ear canal normal.     Left Ear: Tympanic membrane and ear canal normal.     Nose: Nose normal.     Mouth/Throat:     Pharynx: Oropharynx is clear.  Eyes:      Conjunctiva/sclera: Conjunctivae normal.  Cardiovascular:     Rate  and Rhythm: Normal rate and regular rhythm.  Pulmonary:     Effort: Pulmonary effort is normal.     Breath sounds: Normal breath sounds.  Abdominal:     Palpations: Abdomen is soft.     Tenderness: There is no abdominal tenderness.  Musculoskeletal:        General: Normal range of motion.     Cervical back: Normal range of motion.     Comments: Right ring finger erythema and swelling, generalized.   Skin:    General: Skin is warm and dry.     Findings: No rash.  Neurological:     Mental Status: She is alert.  Psychiatric:        Mood and Affect: Mood normal.      UC Treatments / Results  Labs (all labs ordered are listed, but only abnormal results are displayed) Labs Reviewed - No data to display  EKG   Radiology No results found.  Procedures Procedures (including critical care time)  Medications Ordered in UC Medications - No data to display  Initial Impression / Assessment and Plan / UC Course  I have reviewed the triage vital signs and the nursing notes.  Pertinent labs & imaging results that were available during my care of the patient were reviewed by me and considered in my medical decision making (see chart for details).     Cough- most likely allergy or reflux related Recommended continue her zyrtec and omeprazole.  Chest x ray normal Prescribed cough medication.  Prednisone burst  Albuterol as needed.   Finger swelling- most likely arthritis vs insect bite.  Does not appear to be infected and she did not injure it.  Prednisone should help if this is allergic response or arthritis Monitor.  Final Clinical Impressions(s) / UC Diagnoses   Final diagnoses:  Cough     Discharge Instructions     Your x ray was normal Keep taking the allergy medication daily.  Keep using the albuterol as needed.  This may be some allergy induced asthma exacerbation. We will give you a few days  worth of prednisone.  This will also help with the finger swelling and inflammation If your finger gets worse you need to follow-up You can also use the Flonase you have for extra allergy relief.  Warm saltwater gargles to the throat Warm teas  Follow-up with your primary care for any continued or worsening problems    ED Prescriptions    Medication Sig Dispense Auth. Provider   predniSONE (DELTASONE) 10 MG tablet Take 2 tablets (20 mg total) by mouth daily for 5 days. 10 tablet Claudean Leavelle A, NP   promethazine-dextromethorphan (PROMETHAZINE-DM) 6.25-15 MG/5ML syrup Take 5 mLs by mouth 3 (three) times daily as needed for cough. 118 mL Jonpaul Lumm A, NP   omeprazole (PRILOSEC) 20 MG capsule Take 1 capsule (20 mg total) by mouth daily. 30 capsule Hodari Chuba A, NP   omeprazole (PRILOSEC) 20 MG capsule  (Status: Discontinued) Take 2 capsules (40 mg total) by mouth daily. 30 capsule Ismeal Heider A, NP     I have reviewed the PDMP during this encounter.   Loura Halt A, NP 01/05/20 1438

## 2020-01-03 NOTE — Discharge Instructions (Addendum)
Your x ray was normal Keep taking the allergy medication daily.  Keep using the albuterol as needed.  This may be some allergy induced asthma exacerbation. We will give you a few days worth of prednisone.  This will also help with the finger swelling and inflammation If your finger gets worse you need to follow-up You can also use the Flonase you have for extra allergy relief.  Warm saltwater gargles to the throat Warm teas  Follow-up with your primary care for any continued or worsening problems

## 2020-01-09 ENCOUNTER — Ambulatory Visit: Payer: Medicaid Other | Attending: Family

## 2020-01-09 DIAGNOSIS — Z23 Encounter for immunization: Secondary | ICD-10-CM

## 2020-01-09 NOTE — Progress Notes (Signed)
   Covid-19 Vaccination Clinic  Name:  [name]    MRN: [id number] DOB: 30-Apr-1966  01/09/2020  [name] was observed post Covid-19 immunization for 15 minutes without incident. She was provided with Vaccine Information Sheet and instruction to access the V-Safe system.   [name] was instructed to call 911 with any severe reactions post vaccine: Marland Kitchen Difficulty breathing  . Swelling of face and throat  . A fast heartbeat  . A bad rash all over body  . Dizziness and weakness   Immunizations Administered    Name Date Dose VIS Date Route   Moderna COVID-19 Vaccine 01/09/2020 12:40 PM 0.5 mL 09/10/2019 Intramuscular   Manufacturer: Moderna   Lot: [id number]   NDC: 03128-118-86

## 2020-01-16 ENCOUNTER — Encounter: Payer: Self-pay | Admitting: Orthopaedic Surgery

## 2020-01-16 ENCOUNTER — Ambulatory Visit (INDEPENDENT_AMBULATORY_CARE_PROVIDER_SITE_OTHER): Payer: Medicaid Other | Admitting: Orthopaedic Surgery

## 2020-01-16 ENCOUNTER — Other Ambulatory Visit: Payer: Self-pay | Admitting: Radiology

## 2020-01-16 ENCOUNTER — Other Ambulatory Visit: Payer: Self-pay

## 2020-01-16 DIAGNOSIS — G8929 Other chronic pain: Secondary | ICD-10-CM

## 2020-01-16 DIAGNOSIS — M25552 Pain in left hip: Secondary | ICD-10-CM | POA: Diagnosis not present

## 2020-01-16 DIAGNOSIS — M25562 Pain in left knee: Secondary | ICD-10-CM

## 2020-01-16 MED ORDER — DICLOFENAC SODIUM 75 MG PO TBEC: 75.0000 mg | DELAYED_RELEASE_TABLET | Freq: Two times a day (BID) | ORAL | 1 refills | Status: DC | PRN

## 2020-01-16 NOTE — Progress Notes (Signed)
The patient is a very pleasant 54 year old female that we are seeing for second time now as it relates to severe left knee pain but also left hip and groin pain.  She does ambulate with a cane.  She has a patient advocate/caregiver who is with her due to some mental health issues.  She has tried failed other forms of conservative treatment.  We have tried steroid injections as well and is not helping.  Her pain is become severe.  He is requesting an anti-inflammatory and then what ever we can do to help diagnose her situation.  On exam she has a lot of guarding when I put her left hip through range of motion and a lot of guarding we put her left knee through range of motion.  The exam is certainly difficult secondary to her developmental delay as well as her guarding from her pain.  Previous x-rays were unremarkable of the knee as well as the pelvis and hip.  At this point I will try diclofenac as an anti-inflammatory because she did take 1 diclofenac of a family member and I did help some.  We will send this into her pharmacy.  Based on the severity of her pain I would like to obtain an MRI of her left hip and an MRI of her left knee to assess for any type of internal derangement that is contributing to severity of her pain.  All question concerns were answered and addressed otherwise.

## 2020-01-21 ENCOUNTER — Encounter: Payer: Self-pay | Admitting: Family Medicine

## 2020-01-21 ENCOUNTER — Ambulatory Visit: Payer: Medicaid Other | Attending: Family Medicine | Admitting: Family Medicine

## 2020-01-21 ENCOUNTER — Other Ambulatory Visit: Payer: Self-pay

## 2020-01-21 ENCOUNTER — Other Ambulatory Visit: Payer: Self-pay | Admitting: Family Medicine

## 2020-01-21 DIAGNOSIS — F320 Major depressive disorder, single episode, mild: Secondary | ICD-10-CM | POA: Diagnosis not present

## 2020-01-21 DIAGNOSIS — J4530 Mild persistent asthma, uncomplicated: Secondary | ICD-10-CM | POA: Diagnosis not present

## 2020-01-21 DIAGNOSIS — I10 Essential (primary) hypertension: Secondary | ICD-10-CM | POA: Diagnosis not present

## 2020-01-21 DIAGNOSIS — F32A Depression, unspecified: Secondary | ICD-10-CM

## 2020-01-21 MED ORDER — METOPROLOL SUCCINATE ER 25 MG PO TB24: ORAL_TABLET | ORAL | 6 refills | Status: DC

## 2020-01-21 MED ORDER — ALBUTEROL SULFATE HFA 108 (90 BASE) MCG/ACT IN AERS: 2.0000 | INHALATION_SPRAY | Freq: Four times a day (QID) | RESPIRATORY_TRACT | 1 refills | Status: DC | PRN

## 2020-01-21 MED ORDER — BUDESONIDE-FORMOTEROL FUMARATE 160-4.5 MCG/ACT IN AERO: 2.0000 | INHALATION_SPRAY | Freq: Two times a day (BID) | RESPIRATORY_TRACT | 6 refills | Status: DC

## 2020-01-21 NOTE — Progress Notes (Signed)
6 mo f/u Per pt she did not really want to discuss anything with the provider at this time.

## 2020-01-21 NOTE — Progress Notes (Addendum)
Virtual Visit via Telephone Note  I connected with [name], on 01/21/2020 at 1:35 PM by telephone due to the COVID-19 pandemic and verified that I am speaking with the correct person using two identifiers.   Consent: I discussed the limitations, risks, security and privacy concerns of performing an evaluation and management service by telephone and the availability of in person appointments. I also discussed with the patient that there may be a patient responsible charge related to this service. The patient expressed understanding and agreed to proceed.   Location of Patient: Home  Location of Provider: Clinic   Persons participating in Telemedicine visit: Rainie Crenshaw Richard-CMA Dr. Margarita Rana     History of Present Illness: [name] is a 54 year old female with a history of Hypertension, Asthma, GERD, Depression and low back pain seen for a follow up visit. Her back and knees are doing well and she has no pain at the moment. She sees mental health where she is prescribed Zoloft and she feels her Depression is controlled. Asthma is stable with no flares, dyspnea or wheezing and her reflux symptoms are controlled. She does not check her blood pressure at home but has been compliant with her antihypertensive and her blood pressure at her last office visit was 155/82 but prior to that was 117/77 and 113/74.  She walks regularly for exercise and is compliant with a low-sodium diet. She has no additional concerns today. Past Medical History:  Diagnosis Date  . Asthma   . Gastric reflux   . History of blood transfusion yrs agio, none recent  . Hypertension   . Osteoarthritis   . Sleep apnea    cpap machine has not used since 2014 due to machine/tubing in bad shape   . Syncope and collapse yrs ago   Allergies  Allergen Reactions  . Tramadol Hives, Itching and Swelling    Pt states after medication intake she had to go to the ER.     Current Outpatient  Medications on File Prior to Visit  Medication Sig Dispense Refill  . albuterol (PROVENTIL HFA;VENTOLIN HFA) 108 (90 Base) MCG/ACT inhaler Inhale 2 puffs into the lungs 2 (two) times daily. 1 Inhaler 6  . Blood Pressure KIT Use as instructed Dx: Hypertension 1 kit 0  . budesonide-formoterol (SYMBICORT) 160-4.5 MCG/ACT inhaler Inhale 2 puffs into the lungs 2 (two) times daily. 1 Inhaler 6  . cetirizine (ZYRTEC) 10 MG tablet Take 1 tablet (10 mg total) by mouth daily. 30 tablet 2  . diclofenac (VOLTAREN) 75 MG EC tablet Take 1 tablet (75 mg total) by mouth 2 (two) times daily as needed. 60 tablet 1  . ergocalciferol (DRISDOL) 1.25 MG (50000 UT) capsule Take 1 capsule (50,000 Units total) by mouth once a week. 9 capsule 1  . fluticasone (FLONASE) 50 MCG/ACT nasal spray Place 2 sprays into both nostrils daily. 16 g 1  . meloxicam (MOBIC) 7.5 MG tablet Take 1 tablet (7.5 mg total) by mouth daily. 30 tablet 1  . metoprolol succinate (TOPROL-XL) 25 MG 24 hr tablet TAKE 1/2 TABLET(12.5 MG) BY MOUTH DAILY. 15 tablet 6  . omeprazole (PRILOSEC) 20 MG capsule Take 1 capsule (20 mg total) by mouth daily. 30 capsule 1  . promethazine-dextromethorphan (PROMETHAZINE-DM) 6.25-15 MG/5ML syrup Take 5 mLs by mouth 3 (three) times daily as needed for cough. 118 mL 0  . sertraline (ZOLOFT) 50 MG tablet TK 1 T PO IN THE MORNING  0  . tiZANidine (ZANAFLEX) 4 MG tablet Take  1 tablet (4 mg total) by mouth every 8 (eight) hours as needed for muscle spasms. 60 tablet 2   No current facility-administered medications on file prior to visit.    Observations/Objective: Awake, alert, oriented x3 Not in acute distress  CMP Latest Ref Rng & Units 07/15/2019 12/13/2018 01/29/2018  Glucose 65 - 99 mg/dL 80 79 73  BUN 6 - 24 mg/dL '9 11 14  '$ Creatinine 0.57 - 1.00 mg/dL 0.54(L) 0.53(L) 0.52(L)  Sodium 134 - 144 mmol/L 143 141 144  Potassium 3.5 - 5.2 mmol/L 3.7 3.4(L) 3.7  Chloride 96 - 106 mmol/L 108(H) 101 104  CO2 20 - 29  mmol/L '25 25 26  '$ Calcium 8.7 - 10.2 mg/dL 9.2 9.7 9.6  Total Protein 6.0 - 8.5 g/dL 6.7 6.4 6.4  Total Bilirubin 0.0 - 1.2 mg/dL 0.5 0.4 0.5  Alkaline Phos 39 - 117 IU/L 119(H) 116 103  AST 0 - 40 IU/L '14 14 16  '$ ALT 0 - 32 IU/L 26 33(H) 52(H)     Lipid Panel     Component Value Date/Time   CHOL 193 07/15/2019 1141   CHOL 167 03/18/2012 0522   TRIG 63 07/15/2019 1141   TRIG 58 03/18/2012 0522   HDL 56 07/15/2019 1141   HDL 60 03/18/2012 0522   CHOLHDL 3.4 07/15/2019 1141   CHOLHDL 3.4 05/04/2016 1004   VLDL 18 05/04/2016 1004   VLDL 12 03/18/2012 0522   LDLCALC 125 (H) 07/15/2019 1141   LDLCALC 95 03/18/2012 0522   LABVLDL 12 07/15/2019 1141     Assessment and Plan: 1. Mild persistent asthma without complication Controlled Continue current regimen - budesonide-formoterol (SYMBICORT) 160-4.5 MCG/ACT inhaler; Inhale 2 puffs into the lungs 2 (two) times daily.  Dispense: 1 Inhaler; Refill: 6 - albuterol (VENTOLIN HFA) 108 (90 Base) MCG/ACT inhaler; Inhale 2 puffs into the lungs every 6 (six) hours as needed for wheezing or shortness of breath.  Dispense: 18 g; Refill: 1  2. Essential hypertension Uncontrolled at last office visit but was previously controlled No regimen change today Counseled on blood pressure goal of less than 130/80, low-sodium, DASH diet, medication compliance, 150 minutes of moderate intensity exercise per week. Discussed medication compliance, adverse effects. - metoprolol succinate (TOPROL-XL) 25 MG 24 hr tablet; TAKE 1/2 TABLET(12.5 MG) BY MOUTH DAILY.  Dispense: 15 tablet; Refill: 6  3. Mild depression ([place]) Controlled on Zoloft   Follow Up Instructions: 3 months   I discussed the assessment and treatment plan with the patient. The patient was provided an opportunity to ask questions and all were answered. The patient agreed with the plan and demonstrated an understanding of the instructions.   The patient was advised to call back or seek an  in-person evaluation if the symptoms worsen or if the condition fails to improve as anticipated.     I provided 11 minutes total of non-face-to-face time during this encounter including median intraservice time, reviewing previous notes, investigations, ordering medications, medical decision making, coordinating care and patient verbalized understanding at the end of the visit.     Charlott Rakes, MD, FAAFP. [facility] [place], [place]   01/21/2020, 1:35 PM

## 2020-01-22 ENCOUNTER — Encounter: Payer: Self-pay | Admitting: Emergency Medicine

## 2020-01-22 ENCOUNTER — Ambulatory Visit
Admission: EM | Admit: 2020-01-22 | Discharge: 2020-01-22 | Disposition: A | Discharge status: Home / Self Care | Payer: Medicaid Other | Attending: Emergency Medicine | Admitting: Emergency Medicine

## 2020-01-22 ENCOUNTER — Other Ambulatory Visit: Payer: Self-pay

## 2020-01-22 DIAGNOSIS — L03211 Cellulitis of face: Secondary | ICD-10-CM

## 2020-01-22 MED ORDER — AMOXICILLIN-POT CLAVULANATE 875-125 MG PO TABS: 1.0000 | ORAL_TABLET | Freq: Two times a day (BID) | ORAL | 0 refills | Status: DC

## 2020-01-22 MED ORDER — SULFAMETHOXAZOLE-TRIMETHOPRIM 800-160 MG PO TABS: 1.0000 | ORAL_TABLET | Freq: Two times a day (BID) | ORAL | 0 refills | Status: AC

## 2020-01-22 NOTE — Discharge Instructions (Addendum)
Low-Cost [facility] Resources:  [place] [facility] Address: [address], [place], [place], [address] Phone: [phone]  - Dr[facility] Address: [address], [place], [place], [address] Phone: [phone] 

## 2020-01-22 NOTE — ED Triage Notes (Signed)
Left facial swelling that started this morning, left side of face is painful.  Patient says she has bumps in her nose.  Patient dopes not have any teeth

## 2020-01-22 NOTE — ED Provider Notes (Signed)
[name]    CSN: [id number] Arrival date & time: 01/22/20  1902      History   Chief Complaint Chief Complaint  Patient presents with  . Facial Pain    HPI [name] is a 54 y.o. female with history of asthma, GERD, hypertension, obesity presenting for left facial swelling since this morning.  Patient is endorsing pain.  Does not have any teeth left: Denying abdominal pain or pain with eating.  No ear pain, nose pain, throat pain.  Denies recent bug bite or trauma.   Past Medical History:  Diagnosis Date  . Asthma   . Gastric reflux   . History of blood transfusion yrs agio, none recent  . Hypertension   . Osteoarthritis   . Sleep apnea    cpap machine has not used since 2014 due to machine/tubing in bad shape   . Syncope and collapse yrs ago    Patient Active Problem List   Diagnosis Date Noted  . Obesity 01/29/2018  . Acute left-sided low back pain with left-sided sciatica 11/27/2017  . Low back pain 10/30/2017  . Itching with irritation 06/23/2016  . Gastric reflux 05/04/2016  . Asthma 05/04/2016  . Essential hypertension 07/23/2015  . Mild depression ([place]) 07/23/2015  . Chest pain 06/06/2012  . Murmur, cardiac 06/06/2012    Past Surgical History:  Procedure Laterality Date  . CARDIAC CATHETERIZATION     more than 5 years ago  . CARDIAC CATHETERIZATION  06-07-2012   [facility]: Normal coronary arteries. No significant LVOT/aortic valve gradient  . CHOLECYSTECTOMY    . COLONOSCOPY WITH PROPOFOL N/A 02/27/2014   Procedure: COLONOSCOPY WITH PROPOFOL;  Surgeon: Garlan Fair, MD;  Location: [facility];  Service: Endoscopy;  Laterality: N/A;  . ESOPHAGOGASTRODUODENOSCOPY (EGD) WITH PROPOFOL N/A 02/27/2014   Procedure: ESOPHAGOGASTRODUODENOSCOPY (EGD) WITH PROPOFOL;  Surgeon: Garlan Fair, MD;  Location: [facility];  Service: Endoscopy;  Laterality: N/A;  . EXPLORATORY LAPAROTOMY  09/2001   ELAP, LOA, R partial oophorectomy, repair of  bowel injury  . VAGINAL HYSTERECTOMY  2001   Adnexa left in place    OB History    Gravida  2   Para  2   Term  2   Preterm      AB      Living  2     SAB      TAB      Ectopic      Multiple      Live Births               Home Medications    Prior to Admission medications   Medication Sig Start Date End Date Taking? Authorizing Provider  albuterol (VENTOLIN HFA) 108 (90 Base) MCG/ACT inhaler INHALE 2 PUFFS INTO THE LUNGS EVERY 6 HOURS AS NEEDED FOR WHEEZING OR SHORTNESS OF BREATH 01/22/20   Charlott Rakes, MD  amoxicillin-clavulanate (AUGMENTIN) 875-125 MG tablet Take 1 tablet by mouth every 12 (twelve) hours. 01/22/20   Hall-Potvin, [place], PA-C  Blood Pressure KIT Use as instructed Dx: Hypertension 07/15/19   Charlott Rakes, MD  budesonide-formoterol (SYMBICORT) 160-4.5 MCG/ACT inhaler Inhale 2 puffs into the lungs 2 (two) times daily. 01/21/20   Charlott Rakes, MD  cetirizine (ZYRTEC) 10 MG tablet Take 1 tablet (10 mg total) by mouth daily. 01/29/18   Charlott Rakes, MD  diclofenac (VOLTAREN) 75 MG EC tablet Take 1 tablet (75 mg total) by mouth 2 (two) times daily as needed. 01/16/20   Ninfa Linden,  Lind Guest, MD  ergocalciferol (DRISDOL) 1.25 MG (50000 UT) capsule Take 1 capsule (50,000 Units total) by mouth once a week. 07/16/19   Charlott Rakes, MD  fluticasone (FLONASE) 50 MCG/ACT nasal spray Place 2 sprays into both nostrils daily. 07/15/19   Charlott Rakes, MD  meloxicam (MOBIC) 7.5 MG tablet Take 1 tablet (7.5 mg total) by mouth daily. 09/23/19   Charlott Rakes, MD  metoprolol succinate (TOPROL-XL) 25 MG 24 hr tablet TAKE 1/2 TABLET(12.5 MG) BY MOUTH DAILY. 01/21/20   Charlott Rakes, MD  omeprazole (PRILOSEC) 20 MG capsule Take 1 capsule (20 mg total) by mouth daily. 01/03/20   Loura Halt A, NP  promethazine-dextromethorphan (PROMETHAZINE-DM) 6.25-15 MG/5ML syrup Take 5 mLs by mouth 3 (three) times daily as needed for cough. 01/03/20   Loura Halt A, NP    sertraline (ZOLOFT) 50 MG tablet TK 1 T PO IN THE MORNING 04/27/18   [provider]  sulfamethoxazole-trimethoprim (BACTRIM DS) 800-160 MG tablet Take 1 tablet by mouth 2 (two) times daily for 7 days. 01/22/20 01/29/20  Hall-Potvin, [place], PA-C  tiZANidine (ZANAFLEX) 4 MG tablet Take 1 tablet (4 mg total) by mouth every 8 (eight) hours as needed for muscle spasms. 12/13/18   Charlott Rakes, MD    Family History No family history on file.  Social History Social History   Tobacco Use  . Smoking status: Never Smoker  . Smokeless tobacco: Never Used  Substance Use Topics  . Alcohol use: No  . Drug use: No     Allergies   Tramadol   Review of Systems As per HPI   Physical Exam Triage Vital Signs ED Triage Vitals  Enc Vitals Group     BP      Pulse      Resp      Temp      Temp src      SpO2      Weight      Height      Head Circumference      Peak Flow      Pain Score      Pain Loc      Pain Edu?      Excl. in [place]?    No data found.  Updated Vital Signs BP (!) 154/91 (BP Location: Left Arm)   Pulse 82   Temp 98.3 F (36.8 C) (Oral)   Resp 18   SpO2 94%   Visual Acuity Right Eye Distance:   Left Eye Distance:   Bilateral Distance:    Right Eye Near:   Left Eye Near:    Bilateral Near:     Physical Exam Constitutional:      General: She is not in acute distress. HENT:     Head: Normocephalic and atraumatic.     Jaw: There is normal jaw occlusion. No tenderness or pain on movement.     Right Ear: Hearing, tympanic membrane, ear canal and external ear normal. No tenderness. No mastoid tenderness.     Left Ear: Hearing, tympanic membrane, ear canal and external ear normal. No tenderness. No mastoid tenderness.     Nose: No nasal deformity, septal deviation or nasal tenderness.     Right Turbinates: Not swollen or pale.     Left Turbinates: Not swollen or pale.     Right Sinus: No maxillary sinus tenderness or frontal sinus tenderness.      Left Sinus: No maxillary sinus tenderness or frontal sinus tenderness.     Mouth/Throat:  Lips: Pink. No lesions.     Mouth: Mucous membranes are moist. No injury.     Pharynx: Oropharynx is clear. Uvula midline. No posterior oropharyngeal erythema or uvula swelling.     Comments: No teeth present.  No gingival tenderness of right upper or lower, left lower.  Patient does have left upper gingival tenderness without open wound, bleeding or fluctuance Eyes:     General: No scleral icterus.    Conjunctiva/sclera: Conjunctivae normal.     Pupils: Pupils are equal, round, and reactive to light.  Cardiovascular:     Rate and Rhythm: Normal rate.  Pulmonary:     Effort: Pulmonary effort is normal.  Musculoskeletal:     Cervical back: Normal range of motion and neck supple. No muscular tenderness.  Lymphadenopathy:     Cervical: No cervical adenopathy.  Skin:    Comments: Left facial swelling noted: induration above nasolab fold  Neurological:     Mental Status: She is alert and oriented to person, place, and time.      UC Treatments / Results  Labs (all labs ordered are listed, but only abnormal results are displayed) Labs Reviewed - No data to display  EKG   Radiology No results found.  Procedures Procedures (including critical care time)  Medications Ordered in UC Medications - No data to display  Initial Impression / Assessment and Plan / UC Course  I have reviewed the triage vital signs and the nursing notes.  Pertinent labs & imaging results that were available during my care of the patient were reviewed by me and considered in my medical decision making (see chart for details).     Patient afebrile, nontoxic in office today.  Patient does have significant facial swelling with induration near left lesion labial fold.  Will start antibiotics to cover for preseptal cellulitis given rapid growth and current location.  Patient to follow-up with dentist.  Return  precautions discussed, patient verbalized understanding and is agreeable to plan. Final Clinical Impressions(s) / UC Diagnoses   Final diagnoses:  Facial cellulitis     Discharge Instructions     [facility]: [facility] Address: [address], [place], [place], [address] Phone: [phone]  - Dr. Donn Pierini Address: [address] [place], [place], [address] Phone: [phone]    ED Prescriptions    Medication Sig Dispense Auth. Provider   amoxicillin-clavulanate (AUGMENTIN) 875-125 MG tablet Take 1 tablet by mouth every 12 (twelve) hours. 14 tablet Hall-Potvin, [place], PA-C   sulfamethoxazole-trimethoprim (BACTRIM DS) 800-160 MG tablet Take 1 tablet by mouth 2 (two) times daily for 7 days. 14 tablet Hall-Potvin, [place], PA-C     PDMP not reviewed this encounter.   Neldon Mc [place], [place] 01/24/20 1839

## 2020-02-18 ENCOUNTER — Ambulatory Visit: Payer: Medicaid Other | Attending: Family

## 2020-02-18 DIAGNOSIS — Z23 Encounter for immunization: Secondary | ICD-10-CM

## 2020-02-18 NOTE — Progress Notes (Signed)
   Covid-19 Vaccination Clinic  Name:  Dava Rensch    MRN: [id number] DOB: April 18, 1966  02/18/2020  Ms. Lehew was observed post Covid-19 immunization for 15 minutes without incident. She was provided with Vaccine Information Sheet and instruction to access the V-Safe system.   Ms. Calkin was instructed to call 911 with any severe reactions post vaccine: Marland Kitchen Difficulty breathing  . Swelling of face and throat  . A fast heartbeat  . A bad rash all over body  . Dizziness and weakness   Immunizations Administered    Name Date Dose VIS Date Route   Moderna COVID-19 Vaccine 02/18/2020 12:09 PM 0.5 mL 09/2019 Intramuscular   Manufacturer: Moderna   Lot: [id number]   NDC: 85927-639-43

## 2020-02-20 ENCOUNTER — Other Ambulatory Visit: Payer: Self-pay

## 2020-02-20 ENCOUNTER — Encounter: Payer: Self-pay | Admitting: Emergency Medicine

## 2020-02-20 ENCOUNTER — Ambulatory Visit
Admission: EM | Admit: 2020-02-20 | Discharge: 2020-02-20 | Disposition: A | Discharge status: Home / Self Care | Payer: Medicaid Other | Attending: Family Medicine | Admitting: Family Medicine

## 2020-02-20 DIAGNOSIS — R05 Cough: Secondary | ICD-10-CM

## 2020-02-20 DIAGNOSIS — R0789 Other chest pain: Secondary | ICD-10-CM

## 2020-02-20 DIAGNOSIS — R059 Cough, unspecified: Secondary | ICD-10-CM

## 2020-02-20 DIAGNOSIS — R5383 Other fatigue: Secondary | ICD-10-CM

## 2020-02-20 MED ORDER — DEXAMETHASONE SODIUM PHOSPHATE 10 MG/ML IJ SOLN
10.0000 mg | Freq: Once | INTRAMUSCULAR | Status: AC
  Administered 2020-02-20: 10 mg via INTRAMUSCULAR

## 2020-02-20 MED ORDER — PREDNISONE 10 MG (21) PO TBPK: ORAL_TABLET | Freq: Every day | ORAL | 0 refills | Status: AC

## 2020-02-20 NOTE — ED Provider Notes (Signed)
[facility]   [id number] 02/20/20 Arrival Time: 1927   CC: COVID symptoms  SUBJECTIVE: History from: patient.  [name] is a 54 y.o. female who presents with abrupt onset of nasal congestion, PND, and persistent dry cough for the last 3 days. Denies sick exposure to COVID, flu or strep. Reports that she has had both Covid vaccines. Denies recent travel. Has tried albuterol inahler without relief.  There are no aggravating symptoms. Reports previous symptoms in the past. Denies fever, chills, fatigue, sinus pain, rhinorrhea, sore throat, wheezing, chest pain, nausea, changes in bowel or bladder habits.    ROS: As per HPI.  All other pertinent ROS negative.     Past Medical History:  Diagnosis Date  . Asthma   . Gastric reflux   . History of blood transfusion yrs agio, none recent  . Hypertension   . Osteoarthritis   . Sleep apnea    cpap machine has not used since 2014 due to machine/tubing in bad shape   . Syncope and collapse yrs ago   Past Surgical History:  Procedure Laterality Date  . CARDIAC CATHETERIZATION     more than 5 years ago  . CARDIAC CATHETERIZATION  06-07-2012   [facility]: Normal coronary arteries. No significant LVOT/aortic valve gradient  . CHOLECYSTECTOMY    . COLONOSCOPY WITH PROPOFOL N/A 02/27/2014   Procedure: COLONOSCOPY WITH PROPOFOL;  Surgeon: Garlan Fair, MD;  Location: [facility];  Service: Endoscopy;  Laterality: N/A;  . ESOPHAGOGASTRODUODENOSCOPY (EGD) WITH PROPOFOL N/A 02/27/2014   Procedure: ESOPHAGOGASTRODUODENOSCOPY (EGD) WITH PROPOFOL;  Surgeon: Garlan Fair, MD;  Location: [facility];  Service: Endoscopy;  Laterality: N/A;  . EXPLORATORY LAPAROTOMY  09/2001   ELAP, LOA, R partial oophorectomy, repair of bowel injury  . VAGINAL HYSTERECTOMY  2001   Adnexa left in place   Allergies  Allergen Reactions  . Tramadol Hives, Itching and Swelling    Pt states after medication intake she had to go to the ER.    No current  facility-administered medications on file prior to encounter.   Current Outpatient Medications on File Prior to Encounter  Medication Sig Dispense Refill  . albuterol (VENTOLIN HFA) 108 (90 Base) MCG/ACT inhaler INHALE 2 PUFFS INTO THE LUNGS EVERY 6 HOURS AS NEEDED FOR WHEEZING OR SHORTNESS OF BREATH 54 g 0  . amoxicillin-clavulanate (AUGMENTIN) 875-125 MG tablet Take 1 tablet by mouth every 12 (twelve) hours. 14 tablet 0  . Blood Pressure KIT Use as instructed Dx: Hypertension 1 kit 0  . budesonide-formoterol (SYMBICORT) 160-4.5 MCG/ACT inhaler Inhale 2 puffs into the lungs 2 (two) times daily. 1 Inhaler 6  . cetirizine (ZYRTEC) 10 MG tablet Take 1 tablet (10 mg total) by mouth daily. 30 tablet 2  . diclofenac (VOLTAREN) 75 MG EC tablet Take 1 tablet (75 mg total) by mouth 2 (two) times daily as needed. 60 tablet 1  . ergocalciferol (DRISDOL) 1.25 MG (50000 UT) capsule Take 1 capsule (50,000 Units total) by mouth once a week. 9 capsule 1  . fluticasone (FLONASE) 50 MCG/ACT nasal spray Place 2 sprays into both nostrils daily. 16 g 1  . meloxicam (MOBIC) 7.5 MG tablet Take 1 tablet (7.5 mg total) by mouth daily. 30 tablet 1  . metoprolol succinate (TOPROL-XL) 25 MG 24 hr tablet TAKE 1/2 TABLET(12.5 MG) BY MOUTH DAILY. 15 tablet 6  . omeprazole (PRILOSEC) 20 MG capsule Take 1 capsule (20 mg total) by mouth daily. 30 capsule 1  . promethazine-dextromethorphan (PROMETHAZINE-DM) 6.25-15 MG/5ML  syrup Take 5 mLs by mouth 3 (three) times daily as needed for cough. 118 mL 0  . sertraline (ZOLOFT) 50 MG tablet TK 1 T PO IN THE MORNING  0  . tiZANidine (ZANAFLEX) 4 MG tablet Take 1 tablet (4 mg total) by mouth every 8 (eight) hours as needed for muscle spasms. 60 tablet 2   Social History   Socioeconomic History  . Marital status: Single    Spouse name: Not on file  . Number of children: Not on file  . Years of education: Not on file  . Highest education level: Not on file  Occupational History  .  Not on file  Tobacco Use  . Smoking status: Never Smoker  . Smokeless tobacco: Never Used  Substance and Sexual Activity  . Alcohol use: No  . Drug use: No  . Sexual activity: Never    Birth control/protection: Surgical  Other Topics Concern  . Not on file  Social History Narrative  . Not on file   Social Determinants of Health   Financial Resource Strain:   . Difficulty of Paying Living Expenses:   Food Insecurity:   . Worried About Charity fundraiser in the Last Year:   . Arboriculturist in the Last Year:   Transportation Needs:   . Film/video editor (Medical):   Marland Kitchen Lack of Transportation (Non-Medical):   Physical Activity:   . Days of Exercise per Week:   . Minutes of Exercise per Session:   Stress:   . Feeling of Stress :   Social Connections:   . Frequency of Communication with Friends and Family:   . Frequency of Social Gatherings with Friends and Family:   . Attends Religious Services:   . Active Member of Clubs or Organizations:   . Attends Archivist Meetings:   Marland Kitchen Marital Status:   Intimate Partner Violence:   . Fear of Current or Ex-Partner:   . Emotionally Abused:   Marland Kitchen Physically Abused:   . Sexually Abused:    No family history on file.  OBJECTIVE:  Vitals:   02/20/20 1940  BP: (!) 156/90  Pulse: 80  Resp: 18  Temp: 98.7 F (37.1 C)  TempSrc: Oral  SpO2: 97%     General appearance: alert; appears fatigued, but nontoxic; speaking in full sentences and tolerating own secretions HEENT: NCAT; Ears: EACs clear, TMs pearly gray; Eyes: PERRL.  EOM grossly intact. Sinuses: nontender; Nose: nares patent without rhinorrhea, Throat: oropharynx clear, tonsils non erythematous or enlarged, uvula midline  Neck: supple without LAD Lungs: unlabored respirations, symmetrical air entry; cough:present, dry; no respiratory distress; CTAB Heart: regular rate and rhythm.  Radial pulses 2+ symmetrical bilaterally Skin: warm and dry  Psychological:  alert and cooperative; normal mood and affect  LABS:  No results found for this or any previous visit (from the past 24 hour(s)).   ASSESSMENT & PLAN:  1. Cough   2. Other chest pain   3. Other fatigue     Meds ordered this encounter  Medications  . predniSONE (STERAPRED UNI-PAK 21 TAB) 10 MG (21) TBPK tablet    Sig: Take by mouth daily for 6 days. Take 6 tablets on day 1, 5 tablets on day 2, 4 tablets on day 3, 3 tablets on day 4, 2 tablets on day 5, 1 tablet on day 6    Dispense:  21 tablet    Refill:  0    Order Specific Question:  Supervising Provider    Answer:   Chase Picket [id number]  . dexamethasone (DECADRON) injection 10 mg       Patient should remain in quarantine until they have received culture results.  If negative you may resume normal activities (go back to work/school) while practicing hand hygiene, social distance, and mask wearing.  If positive, patient should remain in quarantine for 10 days from symptom onset AND greater than 72 hours after symptoms resolution (absence of fever without the use of fever-reducing medication and improvement in respiratory symptoms), whichever is longer Get plenty of rest and push fluids Use OTC zyrtec for nasal congestion, runny nose, and/or sore throat Use OTC flonase for nasal congestion and runny nose Use medications daily for symptom relief Use OTC medications like ibuprofen or tylenol as needed fever or pain Call or go to the ED if you have any new or worsening symptoms such as fever, worsening cough, shortness of breath, chest tightness, chest pain, turning blue, changes in mental status.  COVID testing ordered.  It will take between 5-7 days for test results.  Someone will contact you regarding abnormal results.     Reviewed expectations re: course of current medical issues. Questions answered. Outlined signs and symptoms indicating need for more acute intervention. Patient verbalized understanding. After Visit  Summary given.         Faustino Congress, NP 02/20/20 2000

## 2020-02-20 NOTE — Discharge Instructions (Addendum)
Your COVID test is pending.  You should self quarantine until the test result is back.    Take Tylenol as needed for fever or discomfort.  Rest and keep yourself hydrated.    Go to the emergency department if you develop shortness of breath, severe diarrhea, high fever not relieved by Tylenol or ibuprofen, or other concerning symptoms.    

## 2020-02-20 NOTE — ED Triage Notes (Addendum)
Coughing and wheezing started Sunday, 02/16/2020.  Patient having difficulty sleeping because cough is worse at night.    Received second covid shot on Friday 02/14/2020

## 2020-02-22 LAB — SARS-COV-2, NAA 2 DAY TAT

## 2020-02-22 LAB — NOVEL CORONAVIRUS, NAA: SARS-CoV-2, NAA: NOT DETECTED

## 2020-02-27 ENCOUNTER — Other Ambulatory Visit: Payer: Medicaid Other

## 2020-02-27 ENCOUNTER — Other Ambulatory Visit: Payer: Self-pay

## 2020-02-27 ENCOUNTER — Ambulatory Visit
Admission: RE | Admit: 2020-02-27 | Discharge: 2020-02-27 | Disposition: A | Discharge status: Home / Self Care | Payer: Medicaid Other | Source: Ambulatory Visit | Attending: Orthopaedic Surgery | Admitting: Orthopaedic Surgery

## 2020-02-27 DIAGNOSIS — G8929 Other chronic pain: Secondary | ICD-10-CM

## 2020-02-27 DIAGNOSIS — M25562 Pain in left knee: Secondary | ICD-10-CM

## 2020-04-01 ENCOUNTER — Other Ambulatory Visit: Payer: Self-pay

## 2020-04-01 ENCOUNTER — Ambulatory Visit
Admission: EM | Admit: 2020-04-01 | Discharge: 2020-04-01 | Disposition: A | Discharge status: Home / Self Care | Payer: Medicaid Other | Attending: Emergency Medicine | Admitting: Emergency Medicine

## 2020-04-01 ENCOUNTER — Encounter: Payer: Self-pay | Admitting: Emergency Medicine

## 2020-04-01 DIAGNOSIS — G8929 Other chronic pain: Secondary | ICD-10-CM

## 2020-04-01 DIAGNOSIS — R0789 Other chest pain: Secondary | ICD-10-CM | POA: Diagnosis not present

## 2020-04-01 DIAGNOSIS — M25561 Pain in right knee: Secondary | ICD-10-CM

## 2020-04-01 DIAGNOSIS — M79652 Pain in left thigh: Secondary | ICD-10-CM

## 2020-04-01 MED ORDER — OMEPRAZOLE 20 MG PO CPDR: 20.0000 mg | DELAYED_RELEASE_CAPSULE | Freq: Every day | ORAL | 0 refills | Status: DC

## 2020-04-01 MED ORDER — LIDOCAINE VISCOUS HCL 2 % MT SOLN
15.0000 mL | Freq: Once | OROMUCOSAL | Status: AC
  Administered 2020-04-01: 15 mL via ORAL

## 2020-04-01 MED ORDER — ALUM & MAG HYDROXIDE-SIMETH 200-200-20 MG/5ML PO SUSP
30.0000 mL | Freq: Once | ORAL | Status: AC
  Administered 2020-04-01: 30 mL via ORAL

## 2020-04-01 MED ORDER — TIZANIDINE HCL 4 MG PO TABS: 4.0000 mg | ORAL_TABLET | Freq: Three times a day (TID) | ORAL | 0 refills | Status: DC | PRN

## 2020-04-01 MED ORDER — MELOXICAM 7.5 MG PO TABS: 7.5000 mg | ORAL_TABLET | Freq: Every day | ORAL | 0 refills | Status: DC

## 2020-04-01 NOTE — ED Provider Notes (Signed)
[name]    CSN: [id number] Arrival date & time: 04/01/20  1924      History   Chief Complaint Chief Complaint  Patient presents with  . Chest Pain    HPI [name] is a 54 y.o. female with history of OA, sleep apnea, asthma, hypertension, GERD presenting for central chest pain x1 week.  Has been intermittent, nonradiating.  Patient is accompanied by her sister/caregiver who corroborates history: States that she has been mopping floors at work and states this is heavy.  No shoulder pain, neck pain, abdominal pain, nausea, vomiting, shortness of breath.  Denies palpitations, lightheadedness, dizziness or weakness.   Past Medical History:  Diagnosis Date  . Asthma   . Gastric reflux   . History of blood transfusion yrs agio, none recent  . Hypertension   . Osteoarthritis   . Sleep apnea    cpap machine has not used since 2014 due to machine/tubing in bad shape   . Syncope and collapse yrs ago    Patient Active Problem List   Diagnosis Date Noted  . Obesity 01/29/2018  . Acute left-sided low back pain with left-sided sciatica 11/27/2017  . Low back pain 10/30/2017  . Itching with irritation 06/23/2016  . Gastric reflux 05/04/2016  . Asthma 05/04/2016  . Essential hypertension 07/23/2015  . Mild depression ([place]) 07/23/2015  . Chest pain 06/06/2012  . Murmur, cardiac 06/06/2012    Past Surgical History:  Procedure Laterality Date  . CARDIAC CATHETERIZATION     more than 5 years ago  . CARDIAC CATHETERIZATION  06-07-2012   [facility]: Normal coronary arteries. No significant LVOT/aortic valve gradient  . CHOLECYSTECTOMY    . COLONOSCOPY WITH PROPOFOL N/A 02/27/2014   Procedure: COLONOSCOPY WITH PROPOFOL;  Surgeon: Garlan Fair, MD;  Location: [facility];  Service: Endoscopy;  Laterality: N/A;  . ESOPHAGOGASTRODUODENOSCOPY (EGD) WITH PROPOFOL N/A 02/27/2014   Procedure: ESOPHAGOGASTRODUODENOSCOPY (EGD) WITH PROPOFOL;  Surgeon: Garlan Fair, MD;   Location: [facility];  Service: Endoscopy;  Laterality: N/A;  . EXPLORATORY LAPAROTOMY  09/2001   ELAP, LOA, R partial oophorectomy, repair of bowel injury  . VAGINAL HYSTERECTOMY  2001   Adnexa left in place    OB History    Gravida  2   Para  2   Term  2   Preterm      AB      Living  2     SAB      TAB      Ectopic      Multiple      Live Births               Home Medications    Prior to Admission medications   Medication Sig Start Date End Date Taking? Authorizing Provider  albuterol (VENTOLIN HFA) 108 (90 Base) MCG/ACT inhaler INHALE 2 PUFFS INTO THE LUNGS EVERY 6 HOURS AS NEEDED FOR WHEEZING OR SHORTNESS OF BREATH 01/22/20  Yes Charlott Rakes, MD  diclofenac (VOLTAREN) 75 MG EC tablet Take 1 tablet (75 mg total) by mouth 2 (two) times daily as needed. 01/16/20  Yes Mcarthur Rossetti, MD  metoprolol succinate (TOPROL-XL) 25 MG 24 hr tablet TAKE 1/2 TABLET(12.5 MG) BY MOUTH DAILY. 01/21/20  Yes Charlott Rakes, MD  promethazine-dextromethorphan (PROMETHAZINE-DM) 6.25-15 MG/5ML syrup Take 5 mLs by mouth 3 (three) times daily as needed for cough. 01/03/20  Yes Bast, Traci A, NP  sertraline (ZOLOFT) 50 MG tablet TK 1 T PO IN THE  MORNING 04/27/18  Yes [provider]  Blood Pressure KIT Use as instructed Dx: Hypertension 07/15/19   Charlott Rakes, MD  budesonide-formoterol (SYMBICORT) 160-4.5 MCG/ACT inhaler Inhale 2 puffs into the lungs 2 (two) times daily. 01/21/20   Charlott Rakes, MD  cetirizine (ZYRTEC) 10 MG tablet Take 1 tablet (10 mg total) by mouth daily. 01/29/18   Charlott Rakes, MD  meloxicam (MOBIC) 7.5 MG tablet Take 1 tablet (7.5 mg total) by mouth daily. 04/01/20   Hall-Potvin, [place], PA-C  omeprazole (PRILOSEC) 20 MG capsule Take 1 capsule (20 mg total) by mouth daily. 04/01/20   Hall-Potvin, [place], PA-C  tiZANidine (ZANAFLEX) 4 MG tablet Take 1 tablet (4 mg total) by mouth every 8 (eight) hours as needed for muscle spasms. 04/01/20    Hall-Potvin, [place], PA-C  fluticasone (FLONASE) 50 MCG/ACT nasal spray Place 2 sprays into both nostrils daily. 07/15/19 04/01/20  Charlott Rakes, MD    Family History History reviewed. No pertinent family history.  Social History Social History   Tobacco Use  . Smoking status: Never Smoker  . Smokeless tobacco: Never Used  Vaping Use  . Vaping Use: Never used  Substance Use Topics  . Alcohol use: No  . Drug use: No     Allergies   Tramadol   Review of Systems As per HPI   Physical Exam Triage Vital Signs ED Triage Vitals  Enc Vitals Group     BP      Pulse      Resp      Temp      Temp src      SpO2      Weight      Height      Head Circumference      Peak Flow      Pain Score      Pain Loc      Pain Edu?      Excl. in [place]?    No data found.  Updated Vital Signs BP 139/81 (BP Location: Left Arm) Comment (BP Location): large cuff  Pulse (!) 107   Temp 98.2 F (36.8 C) (Oral)   Resp 18   SpO2 98%   Visual Acuity Right Eye Distance:   Left Eye Distance:   Bilateral Distance:    Right Eye Near:   Left Eye Near:    Bilateral Near:     Physical Exam Constitutional:      General: She is not in acute distress.    Appearance: She is obese. She is not ill-appearing or diaphoretic.  HENT:     Head: Normocephalic and atraumatic.     Mouth/Throat:     Mouth: Mucous membranes are moist.     Pharynx: Oropharynx is clear. No oropharyngeal exudate or posterior oropharyngeal erythema.  Eyes:     General: No scleral icterus.    Conjunctiva/sclera: Conjunctivae normal.     Pupils: Pupils are equal, round, and reactive to light.  Neck:     Comments: Trachea midline, negative JVD Cardiovascular:     Rate and Rhythm: Normal rate and regular rhythm.     Heart sounds: No murmur heard.  No gallop.   Pulmonary:     Effort: Pulmonary effort is normal. No respiratory distress.     Breath sounds: No wheezing, rhonchi or rales.  Chest:     Musculoskeletal:     Cervical back: Neck supple. No tenderness.     Comments: Full active ROM of upper extremities bilaterally.  No shoulder  or clavicular TTP.  Sternal chest pain worsened with abduction of upper extremities  Lymphadenopathy:     Cervical: No cervical adenopathy.  Skin:    Capillary Refill: Capillary refill takes less than 2 seconds.     Coloration: Skin is not jaundiced or pale.     Findings: No rash.  Neurological:     General: No focal deficit present.     Mental Status: She is alert and oriented to person, place, and time.      UC Treatments / Results  Labs (all labs ordered are listed, but only abnormal results are displayed) Labs Reviewed - No data to display  EKG   Radiology No results found.  Procedures Procedures (including critical [name] time)  Medications Ordered in UC Medications  alum & mag hydroxide-simeth (MAALOX/MYLANTA) 200-200-20 MG/5ML suspension 30 mL (30 mLs Oral Given 04/01/20 1940)    And  lidocaine (XYLOCAINE) 2 % viscous mouth solution 15 mL (15 mLs Oral Given 04/01/20 1938)    Initial Impression / Assessment and Plan / UC Course  I have reviewed the triage vital signs and the nursing notes.  Pertinent labs & imaging results that were available during my [name] of the patient were reviewed by me and considered in my medical decision making (see chart for details).     Patient febrile, nontoxic, in no acute distress.  Hemodynamically stable.  EKG done in office, reviewed by me compared to previous from 06/29/2017: NSR with particular 85 bpm.  No QTC prolongation, ST elevation or depression.  Waveforms stable in all leads: Nonacute EKG.  Reviewed findings with patient and caregiver who verbalized understanding.  Given duration of symptoms lower concern for acute process at this time.  Patient given GI cocktail with lidocaine which she tolerated well: Denied improvement in symptoms at that time.  H&P more concerning for  musculoskeletal chest pain at this time: We will start naproxen, increase compliance with PPI for underlying GERD.  Return precautions discussed, patient and caregiver/sister verbalized understanding and are agreeable to plan. Final Clinical Impressions(s) / UC Diagnoses   Final diagnoses:  Chest pain, musculoskeletal     Discharge Instructions     Take naproxen 2 times a day. Muscle relaxer before bed. Important to stretch and drink water! Take Prilosec every morning. Follow up with PCP in 2 weeks.    ED Prescriptions    Medication Sig Dispense Auth. Provider   omeprazole (PRILOSEC) 20 MG capsule Take 1 capsule (20 mg total) by mouth daily. 30 capsule Hall-Potvin, [place], PA-C   meloxicam (MOBIC) 7.5 MG tablet Take 1 tablet (7.5 mg total) by mouth daily. 30 tablet Hall-Potvin, [place], PA-C   tiZANidine (ZANAFLEX) 4 MG tablet Take 1 tablet (4 mg total) by mouth every 8 (eight) hours as needed for muscle spasms. 12 tablet Hall-Potvin, [place], PA-C     I have reviewed the PDMP during this encounter.   Hall-Potvin, [place], [place] 04/02/20 [phone]

## 2020-04-01 NOTE — ED Triage Notes (Signed)
Chest pain, center chest for 1 week.  Patient winces in pain when center chest is palpated

## 2020-04-01 NOTE — Discharge Instructions (Signed)
Take naproxen 2 times a day. Muscle relaxer before bed. Important to stretch and drink water! Take Prilosec every morning. Follow up with PCP in 2 weeks.

## 2020-04-02 ENCOUNTER — Encounter: Payer: Self-pay | Admitting: Emergency Medicine

## 2020-04-14 ENCOUNTER — Telehealth: Payer: Self-pay | Admitting: Orthopedic Surgery

## 2020-04-14 NOTE — Telephone Encounter (Signed)
Can we call her with results or do we need to have her make an appt?

## 2020-04-14 NOTE — Telephone Encounter (Signed)
Rinaldo Cloud called in with Ms. Sestak in the background giving permission for Rinaldo Cloud to "talk for her".  Rinaldo Cloud states that Ms. Schuelke had an MRI of her knee on 02/27/20 and has never received the results from that study.  Please call [phone].

## 2020-04-14 NOTE — Telephone Encounter (Signed)
I really need to see her in the office to go over the MRI with her.  You can let them know that it shows that she has a torn knee meniscus in the knee but her cartilage is thinning out so much that she is having much more advanced arthritis in the medial side of her knee that did show on x-rays.  I need to be able to show her the MRI to explain what her recommendation would be in terms of the type of surgery that may benefit her which is more of considering a knee replacement versus a knee arthroscopy.

## 2020-04-14 NOTE — Telephone Encounter (Signed)
Appt made

## 2020-04-15 ENCOUNTER — Encounter: Payer: Self-pay | Admitting: Physician Assistant

## 2020-04-15 ENCOUNTER — Other Ambulatory Visit: Payer: Self-pay

## 2020-04-15 ENCOUNTER — Ambulatory Visit (INDEPENDENT_AMBULATORY_CARE_PROVIDER_SITE_OTHER): Payer: Medicaid Other | Admitting: Physician Assistant

## 2020-04-15 VITALS — Ht 65.0 in | Wt 239.2 lb

## 2020-04-15 DIAGNOSIS — S83282D Other tear of lateral meniscus, current injury, left knee, subsequent encounter: Secondary | ICD-10-CM

## 2020-04-15 DIAGNOSIS — M1712 Unilateral primary osteoarthritis, left knee: Secondary | ICD-10-CM

## 2020-04-15 NOTE — Progress Notes (Signed)
HPI: [name] returns today to go over the MRI of her left knee. She does bring a caregiver with her today due to some mental health issues. She is ambulating with a cane. She has tried steroid injections in the knee which initially helped but it now failed to relieve her pain in the knee. MRI images are reviewed with the patient. MRI of the left knee dated 02/27/2020 showed complex tear medial meniscus at the root of the posterior horn with a radial component along the free edge and a horizontal component reaching the undersurface. Significant thinning of the medial compartmental cartilage with subchondral edema. Lateral compartment minimal degenerative changes. Patellofemoral compartment with mild changes involving the inferior central aspect of the trochlea.  Physical exam: Left knee good range of motion. She has tenderness along medial joint line. No abnormal warmth erythema or effusion no instability valgus varus stressing.  Impression: Left knee complex medial meniscal tear Significant medial compartmental arthritis  Plan the discussed with patient and her caregiver today options of knee arthroscopy versus total knee arthroplasty. Given patient's young age would recommend a knee arthroscopy with partial medial meniscectomy and debridement. Did discuss with her that she could have worsening pain postop or prolonged pain. Also discussed risks of infection and DVT/PE. She would like to proceed with left knee arthroscopy in the near future. She given [name] card. She will follow-up with [place] 1 month week postop. Questions were encouraged and answered.

## 2020-06-10 ENCOUNTER — Encounter: Payer: Self-pay | Admitting: Orthopaedic Surgery

## 2020-06-10 ENCOUNTER — Ambulatory Visit (INDEPENDENT_AMBULATORY_CARE_PROVIDER_SITE_OTHER): Payer: Medicaid Other | Admitting: Orthopaedic Surgery

## 2020-06-10 ENCOUNTER — Other Ambulatory Visit: Payer: Self-pay

## 2020-06-10 DIAGNOSIS — M25561 Pain in right knee: Secondary | ICD-10-CM

## 2020-06-10 NOTE — Progress Notes (Signed)
The patient is someone we have seen before.  She has been dealing with left hip and left knee pain and we have that well-documented in all of our office visits when we saw her last year and earlier this year.  It was on the left side and we have x-rays of her left and right knees as well as her hip and pelvis.  She does complain of the left knee hurting her and we eventually sent her for an MRI of the left knee.  The MRI of the left knee, which I confirmed by looking at it myself and the report, shows arthritic changes in the medial compartment of the knee and a complex medial meniscal tear.  When we saw her in July we recommended surgery on the left knee with an arthroscopy instead of an arthroplasty given her young age and the fact that she does have some mental health issues and we felt that this would help get her back to her job sooner.  Apparently that surgery did not get scheduled and she comes in today for [place] to look at her knee again.  Her caregiver is with her as well.  When I examined her today, it was her right knee that was bothering her and I did not pick up on the fact that it was not her left knee today.  I examined her right knee which she lacked full extension had some swelling and was very painful to me trying to move.  We talked about the MRI and I agreed that we should proceed with a knee arthroscopy and her caregiver agree with this as well.  However once they left the office I look closely at the MRI and our other office notes and it was her left knee that we had recommended the surgery on and not her right knee and it was her left knee that had always bother her.  Now it is her right knee bothering her.  With that being said, I need to actually not schedule surgery yet until we have an MRI of the right knee to assess that knee given now that his knee is hurting her and not her left knee.  I will get all the patient and let them know that as well that we will get the MRI as soon as we can  and go from there in terms of what is the appropriate next steps.

## 2020-06-11 ENCOUNTER — Other Ambulatory Visit: Payer: Self-pay

## 2020-06-11 DIAGNOSIS — M1711 Unilateral primary osteoarthritis, right knee: Secondary | ICD-10-CM

## 2020-07-04 ENCOUNTER — Other Ambulatory Visit: Payer: Medicaid Other

## 2020-07-06 ENCOUNTER — Other Ambulatory Visit: Payer: Medicaid Other

## 2020-07-07 DIAGNOSIS — R0600 Dyspnea, unspecified: Secondary | ICD-10-CM | POA: Insufficient documentation

## 2020-07-07 DIAGNOSIS — R0609 Other forms of dyspnea: Secondary | ICD-10-CM | POA: Insufficient documentation

## 2020-07-07 DIAGNOSIS — R9431 Abnormal electrocardiogram [ECG] [EKG]: Secondary | ICD-10-CM | POA: Insufficient documentation

## 2020-07-07 DIAGNOSIS — R7303 Prediabetes: Secondary | ICD-10-CM | POA: Insufficient documentation

## 2020-07-14 ENCOUNTER — Ambulatory Visit
Admission: RE | Admit: 2020-07-14 | Discharge: 2020-07-14 | Disposition: A | Discharge status: Home / Self Care | Payer: Medicaid Other | Source: Ambulatory Visit | Attending: Orthopaedic Surgery | Admitting: Orthopaedic Surgery

## 2020-07-14 DIAGNOSIS — M1711 Unilateral primary osteoarthritis, right knee: Secondary | ICD-10-CM

## 2020-07-27 ENCOUNTER — Ambulatory Visit (INDEPENDENT_AMBULATORY_CARE_PROVIDER_SITE_OTHER): Payer: Medicaid Other | Admitting: Orthopaedic Surgery

## 2020-07-27 ENCOUNTER — Encounter: Payer: Self-pay | Admitting: Orthopaedic Surgery

## 2020-07-27 DIAGNOSIS — M23321 Other meniscus derangements, posterior horn of medial meniscus, right knee: Secondary | ICD-10-CM | POA: Diagnosis not present

## 2020-07-27 NOTE — Progress Notes (Signed)
The patient comes in today to go over an MRI of her right knee.  She has been having pain with that knee and swelling as well as locking and catching.  This is all been along the medial joint line.  She has tried and failed other conservative treatment measures including steroid injection in that knee.  On examination she still has significant pain with flexion extension of the right knee and is on the medial compartment.  MRI of her knee does show a complex medial meniscal tear with only mild thinning of the articular cartilage.  There is also moderate effusion.  Given the continued symptoms of her medial meniscal tear we are recommending arthroscopic intervention for her right knee.  I explained in detail what this involves including the risk and benefits assessment and describing the surgery in detail.  All question concerns were answered and addressed.  This was discussed with her caregiver over the phone with FaceTime as well.  We will work on getting this scheduled in the near future and then see her back in 1 week postoperative.

## 2020-07-31 ENCOUNTER — Encounter: Payer: Self-pay | Admitting: Emergency Medicine

## 2020-07-31 ENCOUNTER — Ambulatory Visit
Admission: EM | Admit: 2020-07-31 | Discharge: 2020-07-31 | Disposition: A | Discharge status: Home / Self Care | Payer: Medicaid Other | Attending: Emergency Medicine | Admitting: Emergency Medicine

## 2020-07-31 DIAGNOSIS — Z20822 Contact with and (suspected) exposure to covid-19: Secondary | ICD-10-CM | POA: Diagnosis not present

## 2020-07-31 DIAGNOSIS — R053 Chronic cough: Secondary | ICD-10-CM

## 2020-07-31 MED ORDER — GUAIFENESIN-DM 100-10 MG/5ML PO SYRP: 5.0000 mL | ORAL_SOLUTION | ORAL | 0 refills | Status: DC | PRN

## 2020-07-31 NOTE — ED Triage Notes (Signed)
Pt c/o dry cough and nasal congestion for over a week. States cough is worse at night. Pt states having knee surgery next Thursday.

## 2020-07-31 NOTE — Discharge Instructions (Addendum)
Your COVID test is pending - it is important to quarantine / isolate at home until your results are back. °If you test positive and would like further evaluation for persistent or worsening symptoms, you may schedule an E-visit or virtual (video) visit throughout the [facility] MyChart app or website. ° °PLEASE NOTE: If you develop severe chest pain or shortness of breath please go to the ER or call 9-1-1 for further evaluation --> DO NOT schedule electronic or virtual visits for this. °Please call our office for further guidance / recommendations as needed. ° °For information about the Covid vaccine, please visit [email or web address] °

## 2020-07-31 NOTE — ED Provider Notes (Signed)
[name]    CSN: [id number] Arrival date & time: 07/31/20  1111      History   Chief Complaint Chief Complaint  Patient presents with  . Cough    HPI [name] is a 54 y.o. female  With history as below presenting for dry cough, nasal congestion for the last week.  States cough is worse at night.  Does have some postnasal drip.  Denies difficulty breathing, chest pain, productive cough, palpitations.  Does have history of "irregular heart rate ".  Last saw her cardiologist last month: EKG and echo done at that time: Reportedly WNL.  Largely concerned she is having knee surgery next Thursday.  Requesting Covid test.  Past Medical History:  Diagnosis Date  . Asthma   . Gastric reflux   . History of blood transfusion yrs agio, none recent  . Hypertension   . Osteoarthritis   . Sleep apnea    cpap machine has not used since 2014 due to machine/tubing in bad shape   . Syncope and collapse yrs ago    Patient Active Problem List   Diagnosis Date Noted  . Obesity 01/29/2018  . Acute left-sided low back pain with left-sided sciatica 11/27/2017  . Low back pain 10/30/2017  . Itching with irritation 06/23/2016  . Gastric reflux 05/04/2016  . Asthma 05/04/2016  . Essential hypertension 07/23/2015  . Mild depression ([place]) 07/23/2015  . Chest pain 06/06/2012  . Murmur, cardiac 06/06/2012    Past Surgical History:  Procedure Laterality Date  . CARDIAC CATHETERIZATION     more than 5 years ago  . CARDIAC CATHETERIZATION  06-07-2012   [facility]: Normal coronary arteries. No significant LVOT/aortic valve gradient  . CHOLECYSTECTOMY    . COLONOSCOPY WITH PROPOFOL N/A 02/27/2014   Procedure: COLONOSCOPY WITH PROPOFOL;  Surgeon: Garlan Fair, MD;  Location: [facility];  Service: Endoscopy;  Laterality: N/A;  . ESOPHAGOGASTRODUODENOSCOPY (EGD) WITH PROPOFOL N/A 02/27/2014   Procedure: ESOPHAGOGASTRODUODENOSCOPY (EGD) WITH PROPOFOL;  Surgeon: Garlan Fair,  MD;  Location: [facility];  Service: Endoscopy;  Laterality: N/A;  . EXPLORATORY LAPAROTOMY  09/2001   ELAP, LOA, R partial oophorectomy, repair of bowel injury  . VAGINAL HYSTERECTOMY  2001   Adnexa left in place    OB History    Gravida  2   Para  2   Term  2   Preterm      AB      Living  2     SAB      TAB      Ectopic      Multiple      Live Births               Home Medications    Prior to Admission medications   Medication Sig Start Date End Date Taking? Authorizing Provider  albuterol (VENTOLIN HFA) 108 (90 Base) MCG/ACT inhaler INHALE 2 PUFFS INTO THE LUNGS EVERY 6 HOURS AS NEEDED FOR WHEEZING OR SHORTNESS OF BREATH 01/22/20   Charlott Rakes, MD  Blood Pressure KIT Use as instructed Dx: Hypertension 07/15/19   Charlott Rakes, MD  budesonide-formoterol (SYMBICORT) 160-4.5 MCG/ACT inhaler Inhale 2 puffs into the lungs 2 (two) times daily. 01/21/20   Charlott Rakes, MD  cetirizine (ZYRTEC) 10 MG tablet Take 1 tablet (10 mg total) by mouth daily. 01/29/18   Charlott Rakes, MD  diclofenac (VOLTAREN) 75 MG EC tablet Take 1 tablet (75 mg total) by mouth 2 (two) times daily as needed.  01/16/20   Mcarthur Rossetti, MD  guaiFENesin-dextromethorphan (ROBITUSSIN DM) 100-10 MG/5ML syrup Take 5 mLs by mouth every 4 (four) hours as needed for cough. 07/31/20   Hall-Potvin, [place], PA-C  meloxicam (MOBIC) 7.5 MG tablet Take 1 tablet (7.5 mg total) by mouth daily. 04/01/20   Hall-Potvin, [place], PA-C  metoprolol succinate (TOPROL-XL) 25 MG 24 hr tablet TAKE 1/2 TABLET(12.5 MG) BY MOUTH DAILY. 01/21/20   Charlott Rakes, MD  omeprazole (PRILOSEC) 20 MG capsule Take 1 capsule (20 mg total) by mouth daily. 04/01/20   Hall-Potvin, [place], PA-C  sertraline (ZOLOFT) 50 MG tablet TK 1 T PO IN THE MORNING 04/27/18   [provider]  tiZANidine (ZANAFLEX) 4 MG tablet Take 1 tablet (4 mg total) by mouth every 8 (eight) hours as needed for muscle spasms. 04/01/20    Hall-Potvin, [place], PA-C  fluticasone (FLONASE) 50 MCG/ACT nasal spray Place 2 sprays into both nostrils daily. 07/15/19 04/01/20  Charlott Rakes, MD    Family History History reviewed. No pertinent family history.  Social History Social History   Tobacco Use  . Smoking status: Never Smoker  . Smokeless tobacco: Never Used  Vaping Use  . Vaping Use: Never used  Substance Use Topics  . Alcohol use: No  . Drug use: No     Allergies   Tramadol   Review of Systems As per HPI   Physical Exam Triage Vital Signs ED Triage Vitals  Enc Vitals Group     BP      Pulse      Resp      Temp      Temp src      SpO2      Weight      Height      Head Circumference      Peak Flow      Pain Score      Pain Loc      Pain Edu?      Excl. in [place]?    No data found.  Updated Vital Signs BP (!) 162/80 (BP Location: Left Arm)   Pulse 80   Temp 98.5 F (36.9 C) (Oral)   Resp 18   SpO2 97%   Visual Acuity Right Eye Distance:   Left Eye Distance:   Bilateral Distance:    Right Eye Near:   Left Eye Near:    Bilateral Near:     Physical Exam Constitutional:      General: She is not in acute distress.    Appearance: She is not ill-appearing or diaphoretic.  HENT:     Head: Normocephalic and atraumatic.     Mouth/Throat:     Mouth: Mucous membranes are moist.     Pharynx: Oropharynx is clear. No oropharyngeal exudate or posterior oropharyngeal erythema.  Eyes:     General: No scleral icterus.    Conjunctiva/sclera: Conjunctivae normal.     Pupils: Pupils are equal, round, and reactive to light.  Neck:     Comments: Trachea midline, negative JVD Cardiovascular:     Rate and Rhythm: Normal rate and regular rhythm.     Heart sounds: Normal heart sounds. No murmur heard.  No gallop.   Pulmonary:     Effort: Pulmonary effort is normal. No respiratory distress.     Breath sounds: No wheezing, rhonchi or rales.  Musculoskeletal:     Cervical back: Neck supple. No  tenderness.     Right lower leg: No edema.     Left  lower leg: No edema.  Lymphadenopathy:     Cervical: No cervical adenopathy.  Skin:    Capillary Refill: Capillary refill takes less than 2 seconds.     Coloration: Skin is not jaundiced or pale.     Findings: No rash.  Neurological:     General: No focal deficit present.     Mental Status: She is alert and oriented to person, place, and time.      UC Treatments / Results  Labs (all labs ordered are listed, but only abnormal results are displayed) Labs Reviewed  NOVEL CORONAVIRUS, NAA    EKG   Radiology No results found.  Procedures Procedures (including critical care time)  Medications Ordered in UC Medications - No data to display  Initial Impression / Assessment and Plan / UC Course  I have reviewed the triage vital signs and the nursing notes.  Pertinent labs & imaging results that were available during my care of the patient were reviewed by me and considered in my medical decision making (see chart for details).     Febrile, nontoxic in office today.  Patient does have persistent, dry cough that is worse at night.  Routinely followed by cardiologist.  Per chart review, patient had echo done on 07/16/2020: WNL.  No change in medications at that time.  Low concern for acute CHF.  Obtain Covid testing given she has surgery next week.  Return precautions discussed, pt verbalized understanding and is agreeable to plan. Final Clinical Impressions(s) / UC Diagnoses   Final diagnoses:  Encounter for screening laboratory testing for COVID-19 virus  Persistent dry cough     Discharge Instructions     Your COVID test is pending - it is important to quarantine / isolate at home until your results are back. If you test positive and would like further evaluation for persistent or worsening symptoms, you may schedule an E-visit or virtual (video) visit throughout the [facility] app or website.  PLEASE NOTE: If  you develop severe chest pain or shortness of breath please go to the ER or call 9-1-1 for further evaluation --> DO NOT schedule electronic or virtual visits for this. Please call our office for further guidance / recommendations as needed.  For information about the Covid vaccine, please visit [email or web address]    ED Prescriptions    Medication Sig Dispense Auth. Provider   guaiFENesin-dextromethorphan (ROBITUSSIN DM) 100-10 MG/5ML syrup Take 5 mLs by mouth every 4 (four) hours as needed for cough. 118 mL Hall-Potvin, [place], PA-C     PDMP not reviewed this encounter.   Hall-Potvin, [place], [place] 07/31/20 1237

## 2020-08-01 LAB — SARS-COV-2, NAA 2 DAY TAT

## 2020-08-01 LAB — NOVEL CORONAVIRUS, NAA: SARS-CoV-2, NAA: NOT DETECTED

## 2020-08-06 ENCOUNTER — Other Ambulatory Visit: Payer: Self-pay | Admitting: Orthopaedic Surgery

## 2020-08-06 ENCOUNTER — Encounter: Payer: Self-pay | Admitting: Orthopaedic Surgery

## 2020-08-06 DIAGNOSIS — S83231D Complex tear of medial meniscus, current injury, right knee, subsequent encounter: Secondary | ICD-10-CM | POA: Diagnosis not present

## 2020-08-06 MED ORDER — HYDROCODONE-ACETAMINOPHEN 5-325 MG PO TABS: 1.0000 | ORAL_TABLET | Freq: Four times a day (QID) | ORAL | 0 refills | Status: DC | PRN

## 2020-08-13 ENCOUNTER — Inpatient Hospital Stay: Payer: Medicaid Other | Admitting: Physician Assistant

## 2020-08-17 ENCOUNTER — Telehealth: Payer: Self-pay

## 2020-08-17 ENCOUNTER — Ambulatory Visit (INDEPENDENT_AMBULATORY_CARE_PROVIDER_SITE_OTHER): Payer: Medicaid Other | Admitting: Physician Assistant

## 2020-08-17 ENCOUNTER — Encounter: Payer: Self-pay | Admitting: Physician Assistant

## 2020-08-17 DIAGNOSIS — Z9889 Other specified postprocedural states: Secondary | ICD-10-CM

## 2020-08-17 NOTE — Progress Notes (Signed)
HPI: Mrs. Brafford returns today 1 week status post right knee arthroscopy she underwent debridement of complex medial meniscal tear and also debridement of grade 4 changes medial femoral condyle.  Lateral compartment is well-preserved.  She is overall doing okay.  She does have some stiffness at night.  States that her pain in the knee is better than it was prior to surgery.  Physical exam: Right knee she has full extension flexion to approximately 105 degrees.  Port sites are healing well no signs of infection.  Calf supple nontender.  Impression: Status post right knee arthroscopy 07/29/2020  Plan: She will work on range of motion strengthening the knee.  Discussed with her that she may benefit from supplemental injections in the knee in the future due to the arthritic changes involving the medial femoral condyle.  She is given a note to return to work next Monday.  She will follow-up with [place] in 1 month.

## 2020-08-27 ENCOUNTER — Telehealth: Payer: Self-pay

## 2020-09-01 ENCOUNTER — Other Ambulatory Visit: Payer: Self-pay

## 2020-09-01 ENCOUNTER — Ambulatory Visit (HOSPITAL_COMMUNITY)
Admission: EM | Admit: 2020-09-01 | Discharge: 2020-09-01 | Disposition: A | Discharge status: Home / Self Care | Payer: Medicaid Other | Attending: Family Medicine | Admitting: Family Medicine

## 2020-09-01 ENCOUNTER — Encounter (HOSPITAL_COMMUNITY): Payer: Self-pay | Admitting: Emergency Medicine

## 2020-09-01 DIAGNOSIS — T8141XA Infection following a procedure, superficial incisional surgical site, initial encounter: Secondary | ICD-10-CM

## 2020-09-01 MED ORDER — BACITRACIN ZINC 500 UNIT/GM EX OINT
TOPICAL_OINTMENT | CUTANEOUS | Status: AC
  Filled 2020-09-01: qty 3.6

## 2020-09-01 MED ORDER — CEPHALEXIN 500 MG PO CAPS: 500.0000 mg | ORAL_CAPSULE | Freq: Two times a day (BID) | ORAL | 0 refills | Status: DC

## 2020-09-01 NOTE — Discharge Instructions (Addendum)
Wash area 2 x a day and apply antibiotic ointment and a band aid Do this until it heals Take the antibiotic pills 2 x a day

## 2020-09-01 NOTE — ED Triage Notes (Addendum)
Pt states she had a right knee procedure done in October and she noticed some drainage and pus from the incision for the last week.

## 2020-09-01 NOTE — ED Provider Notes (Signed)
[facility]    CSN: [id number] Arrival date & time: 09/01/20  1611      History   Chief Complaint Chief Complaint  Patient presents with  . Knee Pain    HPI [name] is a 54 y.o. female.   HPI   Patient had knee surgery last month.  One of the wounds from her arthroscopic surgery has popped open and has pus.  She would like this checked.  Very little pain. She is to follow-up with her orthopedic next month  Past Medical History:  Diagnosis Date  . Asthma   . Gastric reflux   . History of blood transfusion yrs agio, none recent  . Hypertension   . Osteoarthritis   . Sleep apnea    cpap machine has not used since 2014 due to machine/tubing in bad shape   . Syncope and collapse yrs ago    Patient Active Problem List   Diagnosis Date Noted  . Pre-diabetes 07/07/2020  . Dyspnea on exertion 07/07/2020  . Abnormal ECG 07/07/2020  . Obesity 01/29/2018  . Acute left-sided low back pain with left-sided sciatica 11/27/2017  . Low back pain 10/30/2017  . Itching with irritation 06/23/2016  . Gastric reflux 05/04/2016  . Asthma 05/04/2016  . Essential hypertension 07/23/2015  . Mild depression ([place]) 07/23/2015  . Chest pain 06/06/2012  . Murmur, cardiac 06/06/2012    Past Surgical History:  Procedure Laterality Date  . CARDIAC CATHETERIZATION     more than 5 years ago  . CARDIAC CATHETERIZATION  06-07-2012   [facility]: Normal coronary arteries. No significant LVOT/aortic valve gradient  . CHOLECYSTECTOMY    . COLONOSCOPY WITH PROPOFOL N/A 02/27/2014   Procedure: COLONOSCOPY WITH PROPOFOL;  Surgeon: Garlan Fair, MD;  Location: [facility];  Service: Endoscopy;  Laterality: N/A;  . ESOPHAGOGASTRODUODENOSCOPY (EGD) WITH PROPOFOL N/A 02/27/2014   Procedure: ESOPHAGOGASTRODUODENOSCOPY (EGD) WITH PROPOFOL;  Surgeon: Garlan Fair, MD;  Location: [facility];  Service: Endoscopy;  Laterality: N/A;  . EXPLORATORY LAPAROTOMY  09/2001   ELAP, LOA, R  partial oophorectomy, repair of bowel injury  . VAGINAL HYSTERECTOMY  2001   Adnexa left in place    OB History    Gravida  2   Para  2   Term  2   Preterm      AB      Living  2     SAB      TAB      Ectopic      Multiple      Live Births               Home Medications    Prior to Admission medications   Medication Sig Start Date End Date Taking? Authorizing Provider  albuterol (VENTOLIN HFA) 108 (90 Base) MCG/ACT inhaler INHALE 2 PUFFS INTO THE LUNGS EVERY 6 HOURS AS NEEDED FOR WHEEZING OR SHORTNESS OF BREATH 01/22/20   Charlott Rakes, MD  Blood Pressure KIT Use as instructed Dx: Hypertension 07/15/19   Charlott Rakes, MD  budesonide-formoterol (SYMBICORT) 160-4.5 MCG/ACT inhaler Inhale 2 puffs into the lungs 2 (two) times daily. 01/21/20   Charlott Rakes, MD  cephALEXin (KEFLEX) 500 MG capsule Take 1 capsule (500 mg total) by mouth 2 (two) times daily. 09/01/20   Raylene Everts, MD  cetirizine (ZYRTEC) 10 MG tablet Take 1 tablet (10 mg total) by mouth daily. 01/29/18   Charlott Rakes, MD  diclofenac (VOLTAREN) 75 MG EC tablet Take 1 tablet (75  mg total) by mouth 2 (two) times daily as needed. 01/16/20   Mcarthur Rossetti, MD  guaiFENesin-dextromethorphan (ROBITUSSIN DM) 100-10 MG/5ML syrup Take 5 mLs by mouth every 4 (four) hours as needed for cough. 07/31/20   Hall-Potvin, [place], PA-C  HYDROcodone-acetaminophen (NORCO/VICODIN) 5-325 MG tablet Take 1-2 tablets by mouth every 6 (six) hours as needed for moderate pain. 08/06/20   Mcarthur Rossetti, MD  meloxicam (MOBIC) 7.5 MG tablet Take 1 tablet (7.5 mg total) by mouth daily. 04/01/20   Hall-Potvin, [place], PA-C  metoprolol succinate (TOPROL-XL) 25 MG 24 hr tablet TAKE 1/2 TABLET(12.5 MG) BY MOUTH DAILY. 01/21/20   Charlott Rakes, MD  omeprazole (PRILOSEC) 20 MG capsule Take 1 capsule (20 mg total) by mouth daily. 04/01/20   Hall-Potvin, [place], PA-C  rosuvastatin (CRESTOR) 10 MG tablet Take by  mouth. 07/07/20 07/07/21  [provider]  sertraline (ZOLOFT) 50 MG tablet TK 1 T PO IN THE MORNING 04/27/18   [provider]  tiZANidine (ZANAFLEX) 4 MG tablet Take 1 tablet (4 mg total) by mouth every 8 (eight) hours as needed for muscle spasms. 04/01/20   Hall-Potvin, [place], PA-C  fluticasone (FLONASE) 50 MCG/ACT nasal spray Place 2 sprays into both nostrils daily. 07/15/19 04/01/20  Charlott Rakes, MD    Family History History reviewed. No pertinent family history.  Social History Social History   Tobacco Use  . Smoking status: Never Smoker  . Smokeless tobacco: Never Used  Vaping Use  . Vaping Use: Never used  Substance Use Topics  . Alcohol use: No  . Drug use: No     Allergies   Tramadol   Review of Systems Review of Systems See HPI  Physical Exam Triage Vital Signs ED Triage Vitals  Enc Vitals Group     BP 09/01/20 1644 (!) 152/96     Pulse Rate 09/01/20 1644 95     Resp --      Temp 09/01/20 1644 98.9 F (37.2 C)     Temp Source 09/01/20 1644 Oral     SpO2 09/01/20 1644 96 %     Weight --      Height --      Head Circumference --      Peak Flow --      Pain Score 09/01/20 1639 10     Pain Loc --      Pain Edu? --      Excl. in [place]? --    No data found.  Updated Vital Signs BP (!) 152/96 (BP Location: Left Arm)   Pulse 95   Temp 98.9 F (37.2 C) (Oral)   SpO2 96%      Physical Exam Constitutional:      General: She is not in acute distress.    Appearance: She is well-developed. She is obese.  HENT:     Head: Normocephalic and atraumatic.     Ears:     Comments: Mask is in place Eyes:     Conjunctiva/sclera: Conjunctivae normal.     Pupils: Pupils are equal, round, and reactive to light.  Cardiovascular:     Rate and Rhythm: Normal rate.  Pulmonary:     Effort: Pulmonary effort is normal. No respiratory distress.  Abdominal:     General: There is no distension.     Palpations: Abdomen is soft.  Musculoskeletal:          General: Normal range of motion.     Cervical back: Normal range of motion.  Comments: Right knee is examined.  Full range of motion.  Mild joint line tenderness.  Mild warmth.  The medial arthroscopic portal has 2 cm of erythema surrounding, no induration.  Minimal tenderness.  Stitch abscess present  Skin:    General: Skin is warm and dry.  Neurological:     Mental Status: She is alert.  Psychiatric:        Behavior: Behavior normal.      UC Treatments / Results  Labs (all labs ordered are listed, but only abnormal results are displayed) Labs Reviewed - No data to display  EKG   Radiology No results found.  Procedures Procedures (including critical care time)  Medications Ordered in UC Medications - No data to display  Initial Impression / Assessment and Plan / UC Course  I have reviewed the triage vital signs and the nursing notes.  Pertinent labs & imaging results that were available during my care of the patient were reviewed by me and considered in my medical decision making (see chart for details).     Told patient this is a minor infection.  It does not involve the joint.  Will not complicate her surgery.  Warm compresses.  Antibiotics.  Return as needed Final Clinical Impressions(s) / UC Diagnoses   Final diagnoses:  Infection involving stitch with abscess     Discharge Instructions     Wash area 2 x a day and apply antibiotic ointment and a band aid Do this until it heals Take the antibiotic pills 2 x a day   ED Prescriptions    Medication Sig Dispense Auth. Provider   cephALEXin (KEFLEX) 500 MG capsule Take 1 capsule (500 mg total) by mouth 2 (two) times daily. 10 capsule Raylene Everts, MD     PDMP not reviewed this encounter.   Raylene Everts, MD 09/01/20 [phone]

## 2020-09-14 ENCOUNTER — Ambulatory Visit (INDEPENDENT_AMBULATORY_CARE_PROVIDER_SITE_OTHER): Payer: Medicaid Other | Admitting: Physician Assistant

## 2020-09-14 ENCOUNTER — Encounter: Payer: Self-pay | Admitting: Physician Assistant

## 2020-09-14 DIAGNOSIS — Z9889 Other specified postprocedural states: Secondary | ICD-10-CM

## 2020-09-14 MED ORDER — HYDROCODONE-ACETAMINOPHEN 5-325 MG PO TABS: 1.0000 | ORAL_TABLET | Freq: Four times a day (QID) | ORAL | 0 refills | Status: DC | PRN

## 2020-09-14 NOTE — Progress Notes (Signed)
                                                      HPI: [name] returns today follow-up of her right knee arthroscopy.  She states her knee was impacted went to the ED on 09/01/2020.  She was treated for stitch abscess.  She is placed on Keflex.  She denies any fevers chills.  She is having significant amount of pain in the knee particularly in the evenings.  Has been taking Tylenol for the pain is requesting pain medication   Physical exam: Right knee port sites are healing well.  No signs of infection.  Single nylon sutures present medial port site.  Right knee full extension flexion beyond 90 degrees.  Calf supple nontender.   Impression: Status post right knee arthroscopy 07/29/2020  Plan: She will work on range of motion strengthening the knee.  Scar tissue mobilization encouraged.  She will follow-up with [place] in a month to see how she is doing overall.  Prescription was written for Norco she is to use it sparingly.  She may benefit from a cortisone injection in the knee in the future.  Questions were encouraged and answered

## 2020-09-17 DIAGNOSIS — R6250 Unspecified lack of expected normal physiological development in childhood: Secondary | ICD-10-CM | POA: Insufficient documentation

## 2020-10-12 ENCOUNTER — Encounter: Payer: Self-pay | Admitting: Physician Assistant

## 2020-10-12 ENCOUNTER — Ambulatory Visit (INDEPENDENT_AMBULATORY_CARE_PROVIDER_SITE_OTHER): Payer: Medicaid Other | Admitting: Physician Assistant

## 2020-10-12 DIAGNOSIS — Z9889 Other specified postprocedural states: Secondary | ICD-10-CM

## 2020-10-12 DIAGNOSIS — M25561 Pain in right knee: Secondary | ICD-10-CM

## 2020-10-12 MED ORDER — LIDOCAINE HCL 1 % IJ SOLN
3.0000 mL | INTRAMUSCULAR | Status: AC | PRN
  Administered 2020-10-12: 3 mL

## 2020-10-12 MED ORDER — METHYLPREDNISOLONE ACETATE 40 MG/ML IJ SUSP
40.0000 mg | INTRAMUSCULAR | Status: AC | PRN
  Administered 2020-10-12: 40 mg via INTRA_ARTICULAR

## 2020-10-12 NOTE — Progress Notes (Signed)
   Procedure Note  Patient: [name]             Date of Birth: 07-May-1966           MRN: [id number]             Visit Date: 10/12/2020 HPI: [name] returns today status post right knee arthroscopy 07/29/2020.  She states she is having some pain whenever she bends the knee.  Otherwise she does feel range of motion and strength are improving.  She has had no fevers chills.  Physical exam: Right knee port sites well-healed no signs of infection.  Good range of motion of the right knee no abnormal warmth erythema or effusion.  Procedures: Visit Diagnoses:  1. S/P right knee arthroscopy     Large Joint Inj: R knee on 10/12/2020 8:49 AM Indications: pain Details: 22 G 1.5 in needle, anterolateral approach  Arthrogram: No  Medications: 3 mL lidocaine 1 %; 40 mg methylPREDNISolone acetate 40 MG/ML Outcome: tolerated well, no immediate complications Procedure, treatment alternatives, risks and benefits explained, specific risks discussed. Consent was given by the patient. Immediately prior to procedure a time out was called to verify the correct patient, procedure, equipment, support staff and site/side marked as required. Patient was prepped and draped in the usual sterile fashion.    Plan: She will continue work on range of motion strengthening the knee.  We will see her back in 4 weeks to see what type of response she had to the injection.  Continue work on scar tissue mobilization.

## 2020-11-09 ENCOUNTER — Encounter: Payer: Self-pay | Admitting: Physician Assistant

## 2020-11-09 ENCOUNTER — Ambulatory Visit (INDEPENDENT_AMBULATORY_CARE_PROVIDER_SITE_OTHER): Payer: Medicaid Other | Admitting: Physician Assistant

## 2020-11-09 ENCOUNTER — Telehealth: Payer: Self-pay

## 2020-11-09 DIAGNOSIS — M1711 Unilateral primary osteoarthritis, right knee: Secondary | ICD-10-CM | POA: Diagnosis not present

## 2020-11-09 DIAGNOSIS — Z9889 Other specified postprocedural states: Secondary | ICD-10-CM | POA: Diagnosis not present

## 2020-11-09 NOTE — Telephone Encounter (Signed)
Per Bronson Curb need to order Right knee gel inj. Pt has medicaid which unfortunately doesn't cover it. I will contact pt to discuss other options

## 2020-11-09 NOTE — Progress Notes (Signed)
HPI: [name] returns today status post right knee arthroscopy 07/29/2020. Again she had a complex medial meniscal tear was found to have grade IV chondromalacia medial femoral condyle. She states that the cortisone injection she was given last time did help with the pain but she still has pain in the medial aspect of the knee when ambulating.  Physical exam: Right knee she has good range of motion of the knee. No abnormal warmth erythema. Tenderness medial joint line.  Impression: Right knee status post knee arthroscopy 07/29/2020 Right knee osteoarthritis  Plan: Reviewed with patient and her friend who was on the phone today knee friendly exercises. We'll try to gain approval for supplemental injection in the knee and have her back once this is available. In the interim she'll work on weight loss and strengthening of the right knee. Questions were encouraged and answered at length.

## 2020-11-10 ENCOUNTER — Telehealth: Payer: Self-pay | Admitting: Orthopaedic Surgery

## 2020-11-10 NOTE — Telephone Encounter (Signed)
Patient called back. I explained to her that her insurance will not cover gel injections.

## 2020-11-10 NOTE — Telephone Encounter (Signed)
Called pt and discussed [employer]. Pt wants to proceed. Paperwork mailed to her. Pt understands she needs to completed paperwork and return it back to the office.

## 2020-11-10 NOTE — Telephone Encounter (Signed)
Lvm for pt to call back to discuss  

## 2020-11-19 ENCOUNTER — Telehealth: Payer: Self-pay | Admitting: Physician Assistant

## 2020-11-19 NOTE — Telephone Encounter (Signed)
Rinaldo Cloud called. She says patient is still in pain. Would like to have surgery. You can call Rinaldo Cloud back at [phone]

## 2020-11-19 NOTE — Telephone Encounter (Signed)
Please have her come in to talk about options.

## 2020-11-19 NOTE — Telephone Encounter (Signed)
Please advise 

## 2020-11-20 NOTE — Telephone Encounter (Signed)
Called pt. Appointment with dr.blackman was scheduled

## 2020-12-01 ENCOUNTER — Ambulatory Visit (INDEPENDENT_AMBULATORY_CARE_PROVIDER_SITE_OTHER): Payer: Medicaid Other | Admitting: Orthopaedic Surgery

## 2020-12-01 ENCOUNTER — Encounter: Payer: Self-pay | Admitting: Orthopaedic Surgery

## 2020-12-01 VITALS — Ht 65.0 in | Wt 243.0 lb

## 2020-12-01 DIAGNOSIS — M25562 Pain in left knee: Secondary | ICD-10-CM

## 2020-12-01 DIAGNOSIS — M1711 Unilateral primary osteoarthritis, right knee: Secondary | ICD-10-CM

## 2020-12-01 DIAGNOSIS — M1712 Unilateral primary osteoarthritis, left knee: Secondary | ICD-10-CM

## 2020-12-01 DIAGNOSIS — G8929 Other chronic pain: Secondary | ICD-10-CM

## 2020-12-01 DIAGNOSIS — M25561 Pain in right knee: Secondary | ICD-10-CM

## 2020-12-01 NOTE — Progress Notes (Signed)
Office Visit Note   Patient: [name]           Date of Birth: 1966/04/05           MRN: [id number] Visit Date: 12/01/2020              Requested by: Hoy Register, MD [address] [place],  [place] [address] PCP: Hoy Register, MD   Assessment & Plan: Visit Diagnoses:  1. Primary osteoarthritis of right knee   2. Primary osteoarthritis of left knee   3. Chronic pain of left knee   4. Chronic pain of right knee     Plan: She would need to lose a little bit more weight before we can schedule the surgery and she understands that fully.  I would like to see her back in 6 weeks for repeat weight and BMI calculation.  She is aware of what is close and even be put in paper shorts before she has weight and stand up straight.  I did show her knee replacement model explained in detail what the surgery involves.  All question concerns were answered addressed.  Again we will see her back in 6 weeks  Follow-Up Instructions: Return in about 6 weeks (around 01/12/2021).   Orders:  No orders of the defined types were placed in this encounter.  No orders of the defined types were placed in this encounter.     Procedures: No procedures performed   Clinical Data: No additional findings.   Subjective: No chief complaint on file. The patient is well-known to me.  She continues to have severe pain with her right knee and no significant pain with her left knee.  She has known osteoarthritis of both knees and the left looks worse on x-ray but the right is always been worse clinically.  She has tried failed all forms of conservative treatment with the right knee including even knee arthroscopy.  Even the last injection has not helped.  She ambulates with a cane.  Her BMI today is 40.44.  She is interested in knee replacement surgery.  She would need to stay in the skilled nursing facility short-term afterwards.  She does not have any family support and lives alone.  She does have a  healthcare advocate is worth her due to some developmental delay.  She is a diabetic but has good control of her diabetes.  HPI  Review of Systems She currently denies any headache, chest pain, shortness of breath, fever, chills, nausea, vomiting  Objective: Vital Signs: Ht 5\' 5"  (1.651 m)   Wt 243 lb (110.2 kg)   BMI 40.44 kg/m   Physical Exam She is alert and orient x3 and in no acute distress Ortho Exam Examination of her right knee shows global tenderness especially on the medial joint line and patellofemoral crepitation. Specialty Comments:  No specialty comments available.  Imaging: No results found.   PMFS History: Patient Active Problem List   Diagnosis Date Noted  . Developmental delay 09/17/2020  . Pre-diabetes 07/07/2020  . Dyspnea on exertion 07/07/2020  . Abnormal ECG 07/07/2020  . Obesity 01/29/2018  . Acute left-sided low back pain with left-sided sciatica 11/27/2017  . Low back pain 10/30/2017  . Itching with irritation 06/23/2016  . Gastric reflux 05/04/2016  . Asthma 05/04/2016  . Essential hypertension 07/23/2015  . Mild depression ([facility]) 07/23/2015  . Chest pain 06/06/2012  . Murmur, cardiac 06/06/2012   Past Medical History:  Diagnosis Date  . Asthma   .  Gastric reflux   . History of blood transfusion yrs agio, none recent  . Hypertension   . Osteoarthritis   . Sleep apnea    cpap machine has not used since 2014 due to machine/tubing in bad shape   . Syncope and collapse yrs ago    History reviewed. No pertinent family history.  Past Surgical History:  Procedure Laterality Date  . CARDIAC CATHETERIZATION     more than 5 years ago  . CARDIAC CATHETERIZATION  06-07-2012   [facility]: Normal coronary arteries. No significant LVOT/aortic valve gradient  . CHOLECYSTECTOMY    . COLONOSCOPY WITH PROPOFOL N/A 02/27/2014   Procedure: COLONOSCOPY WITH PROPOFOL;  Surgeon: Charolett Bumpers, MD;  Location: [facility];  Service: Endoscopy;  Laterality:  N/A;  . ESOPHAGOGASTRODUODENOSCOPY (EGD) WITH PROPOFOL N/A 02/27/2014   Procedure: ESOPHAGOGASTRODUODENOSCOPY (EGD) WITH PROPOFOL;  Surgeon: Charolett Bumpers, MD;  Location: [facility];  Service: Endoscopy;  Laterality: N/A;  . EXPLORATORY LAPAROTOMY  09/2001   ELAP, LOA, R partial oophorectomy, repair of bowel injury  . VAGINAL HYSTERECTOMY  2001   Adnexa left in place   Social History   Occupational History  . Not on file  Tobacco Use  . Smoking status: Never Smoker  . Smokeless tobacco: Never Used  Vaping Use  . Vaping Use: Never used  Substance and Sexual Activity  . Alcohol use: No  . Drug use: No  . Sexual activity: Never    Birth control/protection: Surgical

## 2020-12-11 ENCOUNTER — Other Ambulatory Visit: Payer: Self-pay | Admitting: Internal Medicine

## 2020-12-11 DIAGNOSIS — Z1231 Encounter for screening mammogram for malignant neoplasm of breast: Secondary | ICD-10-CM

## 2021-01-12 ENCOUNTER — Ambulatory Visit: Payer: Medicaid Other | Admitting: Orthopaedic Surgery

## 2021-01-13 ENCOUNTER — Ambulatory Visit: Payer: Medicaid Other | Admitting: Orthopaedic Surgery

## 2021-02-02 ENCOUNTER — Ambulatory Visit
Admission: RE | Admit: 2021-02-02 | Discharge: 2021-02-02 | Disposition: A | Discharge status: Home / Self Care | Payer: Medicaid Other | Source: Ambulatory Visit | Attending: Internal Medicine | Admitting: Internal Medicine

## 2021-02-02 ENCOUNTER — Other Ambulatory Visit: Payer: Self-pay

## 2021-02-02 DIAGNOSIS — Z1231 Encounter for screening mammogram for malignant neoplasm of breast: Secondary | ICD-10-CM

## 2021-02-04 ENCOUNTER — Other Ambulatory Visit: Payer: Self-pay | Admitting: *Deleted

## 2021-02-04 ENCOUNTER — Other Ambulatory Visit: Payer: Self-pay | Admitting: Internal Medicine

## 2021-02-04 DIAGNOSIS — R928 Other abnormal and inconclusive findings on diagnostic imaging of breast: Secondary | ICD-10-CM

## 2021-02-05 ENCOUNTER — Other Ambulatory Visit: Payer: Self-pay | Admitting: Internal Medicine

## 2021-02-05 ENCOUNTER — Other Ambulatory Visit: Payer: Self-pay | Admitting: Physician Assistant

## 2021-02-05 DIAGNOSIS — R928 Other abnormal and inconclusive findings on diagnostic imaging of breast: Secondary | ICD-10-CM

## 2021-02-26 ENCOUNTER — Other Ambulatory Visit: Payer: Medicaid Other

## 2021-03-12 ENCOUNTER — Other Ambulatory Visit: Payer: Self-pay | Admitting: Orthopaedic Surgery

## 2021-03-12 DIAGNOSIS — M25552 Pain in left hip: Secondary | ICD-10-CM

## 2021-03-22 ENCOUNTER — Other Ambulatory Visit: Payer: Medicaid Other

## 2021-03-24 ENCOUNTER — Ambulatory Visit (INDEPENDENT_AMBULATORY_CARE_PROVIDER_SITE_OTHER): Payer: Medicaid Other | Admitting: Orthopaedic Surgery

## 2021-03-24 ENCOUNTER — Ambulatory Visit (INDEPENDENT_AMBULATORY_CARE_PROVIDER_SITE_OTHER): Payer: Medicaid Other

## 2021-03-24 ENCOUNTER — Telehealth: Payer: Self-pay

## 2021-03-24 ENCOUNTER — Encounter: Payer: Self-pay | Admitting: Orthopaedic Surgery

## 2021-03-24 VITALS — Ht 65.0 in | Wt 241.0 lb

## 2021-03-24 DIAGNOSIS — Z6841 Body Mass Index (BMI) 40.0 and over, adult: Secondary | ICD-10-CM

## 2021-03-24 DIAGNOSIS — M1711 Unilateral primary osteoarthritis, right knee: Secondary | ICD-10-CM | POA: Diagnosis not present

## 2021-03-24 MED ORDER — ACETAMINOPHEN-CODEINE #3 300-30 MG PO TABS: 1.0000 | ORAL_TABLET | ORAL | 0 refills | Status: DC | PRN

## 2021-03-24 NOTE — Progress Notes (Signed)
HPI: [name] returns today for BMI check.  She is working on weight loss.  She states she is walking for pain.  However she states she is having severe pain in the right knee.  Some pain in the left knee.  She has tried cortisone injections in the past have given her no relief.  She is asking for pain medication today due to the knee pain.  Notes she is applying Voltaren gel over the knee.  She is diabetic with with reported good control.  Review of systems see HPI otherwise negative.  Physical exam: General well-developed well-nourished female no acute distress ambulates without any assistive device.Bilateral knees she has crepitus with passive range of motion both knees.  Full extension both knees.  Flexion to approximately 90 to 100 degrees bilateral knees.  Global tenderness both knees.  No abnormal warmth erythema or effusion in either knee.  Radiographs: Right knee 2 views: No acute fracture.  Tricompartmental arthritic changes with mild changes lateral compartment.  Moderate narrowing medial joint line.  Patellofemoral arthritis changes moderate to severe.  Knee is well located.  Impression: Bilateral knee osteoarthritis Obesity  Plan: Discussed with her quad strengthening exercises.  She needs to continue to work on weight loss.  We will try to gain approval for supplemental injection right knee.  [employer] paperwork was given to the patient today.  We did give her small amount of Tylenol 3 which she is to use sparingly for her knee pain.  She will continue the Voltaren gel over the knee.  We will see back in 3 months for weight check and reevaluation or sooner if she is approved for supplemental injection.  Questions were encouraged and answered by Dr. Magnus Ivan myself.

## 2021-03-24 NOTE — Telephone Encounter (Signed)
Faxed completed [employer] application to [phone] for Monovisc, bilateral knee. 

## 2021-03-30 ENCOUNTER — Other Ambulatory Visit: Payer: Medicaid Other

## 2021-04-01 ENCOUNTER — Telehealth: Payer: Self-pay

## 2021-04-01 NOTE — Telephone Encounter (Signed)
Faxed completed PRF form to [employer] at [phone] for Monovisc, bilateral knee.

## 2021-04-06 ENCOUNTER — Other Ambulatory Visit: Payer: Medicaid Other

## 2021-04-08 ENCOUNTER — Telehealth: Payer: Self-pay | Admitting: Orthopaedic Surgery

## 2021-04-08 NOTE — Telephone Encounter (Signed)
Pt called asking if the insurance company told us if she would be approved or not. Pt would like a CB to be updated on how much longer the wait might be.   [phone]

## 2021-04-09 ENCOUNTER — Telehealth: Payer: Self-pay

## 2021-04-09 NOTE — Telephone Encounter (Signed)
Talked with patient and appointment has been scheduled for gel injection.  

## 2021-04-09 NOTE — Telephone Encounter (Signed)
Received Monovisc injection for bilateral knee through [employer] patient assistance.

## 2021-04-19 ENCOUNTER — Ambulatory Visit (INDEPENDENT_AMBULATORY_CARE_PROVIDER_SITE_OTHER): Payer: Medicaid Other | Admitting: Physician Assistant

## 2021-04-19 ENCOUNTER — Encounter: Payer: Self-pay | Admitting: Physician Assistant

## 2021-04-19 DIAGNOSIS — M1711 Unilateral primary osteoarthritis, right knee: Secondary | ICD-10-CM | POA: Diagnosis not present

## 2021-04-19 DIAGNOSIS — M1712 Unilateral primary osteoarthritis, left knee: Secondary | ICD-10-CM

## 2021-04-19 MED ORDER — HYALURONAN 88 MG/4ML IX SOSY
88.0000 mg | PREFILLED_SYRINGE | INTRA_ARTICULAR | Status: AC | PRN
Start: 2021-04-19 — End: 2021-04-19
  Administered 2021-04-19: 88 mg via INTRA_ARTICULAR

## 2021-04-19 MED ORDER — HYALURONAN 88 MG/4ML IX SOSY
88.0000 mg | PREFILLED_SYRINGE | INTRA_ARTICULAR | Status: AC | PRN
  Administered 2021-04-19: 88 mg via INTRA_ARTICULAR

## 2021-04-19 NOTE — Progress Notes (Signed)
   Procedure Note  Patient: [name]             Date of Birth: 1966-07-01           MRN: [id number]             Visit Date: 04/19/2021 HPI: [name] is here today for Monovisc injections both knees.  Patient has known osteoarthritis both knees.  Radiographs of both knees show arthritic changes.  Patient is failed conservative treatment.  She has no planned knee surgery within the next 6 months.  Review of systems: Denies any fevers chills shortness of breath.  Denies any new injury to either knee. Physical exam Bilateral knees: No abnormal warmth erythema good range of motion both knees.  Patellofemoral crepitus with passive range of motion both knees. Procedures: Visit Diagnoses:  1. Primary osteoarthritis of right knee   2. Primary osteoarthritis of left knee     Large Joint Inj: bilateral knee on 04/19/2021 11:29 AM Indications: pain Details: 22 G 1.5 in needle, superolateral approach  Arthrogram: No  Medications (Right): 88 mg Hyaluronan 88 MG/4ML Medications (Left): 88 mg Hyaluronan 88 MG/4ML Outcome: tolerated well, no immediate complications Procedure, treatment alternatives, risks and benefits explained, specific risks discussed. Consent was given by the patient. Immediately prior to procedure a time out was called to verify the correct patient, procedure, equipment, support staff and site/side marked as required. Patient was prepped and draped in the usual sterile fashion.   Plan: She will continue work on weight loss and quad strengthening.  Follow-up with [place] in 8 weeks to see what type of response she had to the Monovisc injections both knees.  Paperwork was filled filled out for transmit the wording to gain approval for transportation.  Questions were encouraged and answered at length.

## 2021-05-26 ENCOUNTER — Telehealth: Payer: Self-pay | Admitting: Physician Assistant

## 2021-05-26 NOTE — Telephone Encounter (Signed)
Patient states [facility] wondering if she is having knee surgery I told her to have them call me with questions

## 2021-05-26 NOTE — Telephone Encounter (Signed)
Pt called and wants to directly speak with gil. She wouldn't say anything else.   CB [phone]

## 2021-06-21 ENCOUNTER — Encounter: Payer: Self-pay | Admitting: Physician Assistant

## 2021-06-21 ENCOUNTER — Ambulatory Visit: Payer: Medicaid Other | Admitting: Physician Assistant

## 2021-06-21 ENCOUNTER — Other Ambulatory Visit: Payer: Self-pay

## 2021-06-24 ENCOUNTER — Ambulatory Visit: Payer: Medicaid Other | Admitting: Orthopaedic Surgery

## 2021-07-01 ENCOUNTER — Other Ambulatory Visit: Payer: Self-pay

## 2021-07-01 ENCOUNTER — Encounter: Payer: Self-pay | Admitting: Physician Assistant

## 2021-07-01 ENCOUNTER — Ambulatory Visit (INDEPENDENT_AMBULATORY_CARE_PROVIDER_SITE_OTHER): Payer: Medicaid Other | Admitting: Physician Assistant

## 2021-07-01 DIAGNOSIS — M1712 Unilateral primary osteoarthritis, left knee: Secondary | ICD-10-CM

## 2021-07-01 DIAGNOSIS — M1711 Unilateral primary osteoarthritis, right knee: Secondary | ICD-10-CM | POA: Diagnosis not present

## 2021-07-01 MED ORDER — METHYLPREDNISOLONE ACETATE 40 MG/ML IJ SUSP
40.0000 mg | INTRAMUSCULAR | Status: AC | PRN
  Administered 2021-07-01: 40 mg via INTRA_ARTICULAR

## 2021-07-01 MED ORDER — LIDOCAINE HCL 1 % IJ SOLN
3.0000 mL | INTRAMUSCULAR | Status: AC | PRN
Start: 2021-07-01 — End: 2021-07-01
  Administered 2021-07-01: 3 mL

## 2021-07-01 NOTE — Progress Notes (Signed)
HPI: [name] comes in today follow-up status post bilateral Monovisc injections.  She states the left knee feels better.  Her right knee however she continues to have pain feels as if it locks up on her.  She is status post right knee arthroscopy 07/29/2020.  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  She continues to work on strengthening the knee.  She also is requesting knee brace.  She is using Voltaren gel once daily on the knee.  Physical exam: Bilateral knees good range of motion without significant pain.  Right knee tenderness along medial joint line.  No abnormal warmth erythema or effusion in either knee.  Impression: Left knee osteoarthritis Right knee osteoarthritis  Plan: Patient would like to continue with any type of conservative treatment to keep her from having knee surgery.  Therefore we will send her to formal therapy.  She does ask about a knee brace she is given a hinged knee brace.  Also try repeat cortisone injection.  She will begin using Voltaren gel on the knee 4 g 4 times daily.  Follow-up in 4 weeks to see how she is doing overall.         Procedure Note  Patient: [name]             Date of Birth: 12/24/1965           MRN: [id number]             Visit Date: 07/01/2021  Procedures: Visit Diagnoses:  1. Primary osteoarthritis of right knee   2. Primary osteoarthritis of left knee     Large Joint Inj: R knee on 07/01/2021 3:27 PM Indications: pain Details: 22 G 1.5 in needle, anterolateral approach  Arthrogram: No  Medications: 3 mL lidocaine 1 %; 40 mg methylPREDNISolone acetate 40 MG/ML Outcome: tolerated well, no immediate complications Procedure, treatment alternatives, risks and benefits explained, specific risks discussed. Consent was given by the patient. Immediately prior to procedure a time out was called to verify the correct patient, procedure, equipment, support  staff and site/side marked as required. Patient was prepped and draped in the usual sterile fashion.

## 2021-07-29 ENCOUNTER — Ambulatory Visit: Payer: Medicaid Other | Admitting: Physician Assistant

## 2021-08-26 ENCOUNTER — Other Ambulatory Visit: Payer: Self-pay

## 2021-08-26 ENCOUNTER — Encounter: Payer: Self-pay | Admitting: Physician Assistant

## 2021-08-26 ENCOUNTER — Ambulatory Visit (INDEPENDENT_AMBULATORY_CARE_PROVIDER_SITE_OTHER): Payer: Medicaid Other | Admitting: Physician Assistant

## 2021-08-26 VITALS — Ht 65.0 in | Wt 236.4 lb

## 2021-08-26 DIAGNOSIS — M722 Plantar fascial fibromatosis: Secondary | ICD-10-CM

## 2021-08-26 NOTE — Progress Notes (Signed)
Office Visit Note   Patient: [name]           Date of Birth: Oct 07, 1966           MRN: [id number] Visit Date: 08/26/2021              Requested by: Hoy Register, MD [address] [place],  [place] [address] PCP: Hoy Register, MD   Assessment & Plan: Visit Diagnoses:  1. Plantar fasciitis, bilateral     Plan:  She will change her shoes out daily.  Silicone heel cups were given that she can change in 2 her shoes daily.  Discussed gastrocsoleus stretching exercises and these are shown.  She does not have time for formal therapy.  We will not place her on a Medrol Dosepak at this point in time or injections in either heel given the fact that she does not know what her glucose levels are like at this point in time.  Voltaren gel 4 g 4 times daily to the medial tubercle of the calcaneus bilateral heels.  She will follow-up with [place] on an as-needed basis pain persist or becomes worse.  In regards to her knee she will let us know if she wishes to proceed with knee replacement as she has failed conservative treatment.  She definitely would need to see her primary care physician prior to scheduling surgery as she has not seen her general practitioner in some time.  Follow-Up Instructions: Return if symptoms worsen or fail to improve.   Orders:  No orders of the defined types were placed in this encounter.  No orders of the defined types were placed in this encounter.     Procedures: No procedures performed   Clinical Data: No additional findings.   Subjective: Chief Complaint  Patient presents with   Right Knee - Pain, Follow-up    HPI [name] comes in today for scheduled follow-up for right knee status post cortisone injection 07/01/2021 she states the knee still bothering her.  She started a new job where she stands 9-5 daily.  She notes at this point time that her heels are bothering her more than her knees.  She is having burning sensation both heels and  has been ongoing for the past 2 months no known injury.  She has not seen a primary care doctor in some time has been told she is prediabetic in the past but does not know what her glucose levels are.  She ranks the heel pain burning in both feet to be 10 out of 10 at worst.  She states the first step of the morning is very painful.  She tried some insoles which made her heel pain worse.  She wears a sling.  She is daily to work.  Review of Systems See HPI otherwise negative  Objective: Vital Signs: Ht 5\' 5"  (1.651 m)   Wt 236 lb 6.4 oz (107.2 kg)   BMI 39.34 kg/m   Physical Exam General: Well-developed well-nourished female no acute distress mood and affect appropriate Ortho Exam Bilateral feet sensation intact dorsal pedal pulses 2+ no rashes skin lesions ulcerations or erythema.  She has tenderness over the medial tubercle of the calcaneus bilaterally.  Remainder of bilateral feet are nontender.  Tight gastrocs bilaterally. Specialty Comments:  No specialty comments available.  Imaging: No results found.   PMFS History: Patient Active Problem List   Diagnosis Date Noted   Primary osteoarthritis of right knee 07/01/2021   Primary osteoarthritis of left  knee 07/01/2021   Developmental delay 09/17/2020   Pre-diabetes 07/07/2020   Dyspnea on exertion 07/07/2020   Abnormal ECG 07/07/2020   Obesity 01/29/2018   Acute left-sided low back pain with left-sided sciatica 11/27/2017   Low back pain 10/30/2017   Itching with irritation 06/23/2016   Gastric reflux 05/04/2016   Asthma 05/04/2016   Essential hypertension 07/23/2015   Mild depression 07/23/2015   Chest pain 06/06/2012   Murmur, cardiac 06/06/2012   Past Medical History:  Diagnosis Date   Asthma    Gastric reflux    History of blood transfusion yrs agio, none recent   Hypertension    Osteoarthritis    Sleep apnea    cpap machine has not used since 2014 due to machine/tubing in bad shape    Syncope and collapse yrs  ago    History reviewed. No pertinent family history.  Past Surgical History:  Procedure Laterality Date   CARDIAC CATHETERIZATION     more than 5 years ago   CARDIAC CATHETERIZATION  06-07-2012   [facility]: Normal coronary arteries. No significant LVOT/aortic valve gradient   CHOLECYSTECTOMY     COLONOSCOPY WITH PROPOFOL N/A 02/27/2014   Procedure: COLONOSCOPY WITH PROPOFOL;  Surgeon: Charolett Bumpers, MD;  Location: [facility];  Service: Endoscopy;  Laterality: N/A;   ESOPHAGOGASTRODUODENOSCOPY (EGD) WITH PROPOFOL N/A 02/27/2014   Procedure: ESOPHAGOGASTRODUODENOSCOPY (EGD) WITH PROPOFOL;  Surgeon: Charolett Bumpers, MD;  Location: [facility];  Service: Endoscopy;  Laterality: N/A;   EXPLORATORY LAPAROTOMY  09/2001   ELAP, LOA, R partial oophorectomy, repair of bowel injury   VAGINAL HYSTERECTOMY  2001   Adnexa left in place   Social History   Occupational History   Not on file  Tobacco Use   Smoking status: Never   Smokeless tobacco: Never  Vaping Use   Vaping Use: Never used  Substance and Sexual Activity   Alcohol use: No   Drug use: No   Sexual activity: Never    Birth control/protection: Surgical

## 2021-10-28 ENCOUNTER — Other Ambulatory Visit: Payer: Self-pay

## 2021-10-28 ENCOUNTER — Encounter: Payer: Self-pay | Admitting: Physician Assistant

## 2021-10-28 ENCOUNTER — Ambulatory Visit: Payer: Medicaid Other | Attending: Physician Assistant | Admitting: Physician Assistant

## 2021-10-28 VITALS — BP 115/79 | HR 68 | Resp 16 | Wt 232.6 lb

## 2021-10-28 DIAGNOSIS — M5442 Lumbago with sciatica, left side: Secondary | ICD-10-CM

## 2021-10-28 DIAGNOSIS — M1711 Unilateral primary osteoarthritis, right knee: Secondary | ICD-10-CM

## 2021-10-28 DIAGNOSIS — J4530 Mild persistent asthma, uncomplicated: Secondary | ICD-10-CM

## 2021-10-28 DIAGNOSIS — R202 Paresthesia of skin: Secondary | ICD-10-CM

## 2021-10-28 DIAGNOSIS — R7303 Prediabetes: Secondary | ICD-10-CM | POA: Diagnosis not present

## 2021-10-28 DIAGNOSIS — I10 Essential (primary) hypertension: Secondary | ICD-10-CM | POA: Diagnosis not present

## 2021-10-28 DIAGNOSIS — G8929 Other chronic pain: Secondary | ICD-10-CM

## 2021-10-28 DIAGNOSIS — E785 Hyperlipidemia, unspecified: Secondary | ICD-10-CM

## 2021-10-28 MED ORDER — DICLOFENAC SODIUM 1 % EX GEL: 2.0000 g | Freq: Four times a day (QID) | CUTANEOUS | 3 refills | Status: DC

## 2021-10-28 MED ORDER — MELOXICAM 15 MG PO TABS: 15.0000 mg | ORAL_TABLET | Freq: Every day | ORAL | 0 refills | Status: DC

## 2021-10-28 MED ORDER — ROSUVASTATIN CALCIUM 10 MG PO TABS: 10.0000 mg | ORAL_TABLET | Freq: Every day | ORAL | 1 refills | Status: DC

## 2021-10-28 MED ORDER — ALBUTEROL SULFATE HFA 108 (90 BASE) MCG/ACT IN AERS: 2.0000 | INHALATION_SPRAY | Freq: Four times a day (QID) | RESPIRATORY_TRACT | 3 refills | Status: DC | PRN

## 2021-10-28 MED ORDER — METOPROLOL SUCCINATE ER 25 MG PO TB24: ORAL_TABLET | ORAL | 6 refills | Status: DC

## 2021-10-28 MED ORDER — BUDESONIDE-FORMOTEROL FUMARATE 160-4.5 MCG/ACT IN AERO: 2.0000 | INHALATION_SPRAY | Freq: Two times a day (BID) | RESPIRATORY_TRACT | 6 refills | Status: DC

## 2021-10-28 NOTE — Progress Notes (Signed)
Patient ID: [name], female   DOB: [birth year], 56 y.o.   MRN: [id number]   [name], is a 56 y.o. female  [id number]  [id number]  DOB - Mar 17, 1966  Chief Complaint  Patient presents with   Back Pain    lower   Knee Pain    right       Subjective:   [name] is a 56 y.o. female here today for med RF and wants to see if she has developed diabetes.  She has been having some tingling in her feet at times at work.  This is not persistent or consistent.  Occurs occasionally.  She also has had a couple of times she felt weak and sweaty at work and ate a peppermint and immediately felt better.  No polyuria/polydipsia.  Last A1C=5.8.    She continues to have R knee pain and low back pain.  She has an orthopedist.  She is going to make an appt but needs something for discomfort until she can get it scheduled bc tylenol isn't working.  NKI.  These are long-standing problems with no changes  Patient has No headache, No chest pain, No abdominal pain - No Nausea, No new weakness tingling or numbness, No Cough - SOB.  No problems updated.  ALLERGIES: Allergies  Allergen Reactions   Tramadol Hives, Itching and Swelling    [name] states after medication intake she had to go to the ER.     PAST MEDICAL HISTORY: Past Medical History:  Diagnosis Date   Asthma    Gastric reflux    History of blood transfusion yrs agio, none recent   Hypertension    Osteoarthritis    Sleep apnea    cpap machine has not used since 2014 due to machine/tubing in bad shape    Syncope and collapse yrs ago    MEDICATIONS AT HOME: Prior to Admission medications   Medication Sig Start Date End Date Taking? Authorizing Provider  diclofenac Sodium (VOLTAREN) 1 % GEL Apply 2 g topically 4 (four) times daily. 10/28/21  Yes Denene Alamillo, Dionne Bucy, PA-C  meloxicam (MOBIC) 15 MG tablet Take 1 tablet (15 mg total) by mouth daily. 10/28/21  Yes Orla Estrin, Dionne Bucy, PA-C  acetaminophen-codeine (TYLENOL #3)  300-30 MG tablet Take 1 tablet by mouth every 4 (four) hours as needed for moderate pain. 03/24/21   Pete Pelt, PA-C  albuterol (VENTOLIN HFA) 108 (90 Base) MCG/ACT inhaler Inhale 2 puffs into the lungs every 6 (six) hours as needed for wheezing or shortness of breath. 10/28/21   Argentina Donovan, PA-C  Blood Pressure KIT Use as instructed Dx: Hypertension 07/15/19   Charlott Rakes, MD  budesonide-formoterol (SYMBICORT) 160-4.5 MCG/ACT inhaler Inhale 2 puffs into the lungs 2 (two) times daily. 10/28/21   Argentina Donovan, PA-C  cetirizine (ZYRTEC) 10 MG tablet Take 1 tablet (10 mg total) by mouth daily. 01/29/18   Charlott Rakes, MD  metoprolol succinate (TOPROL-XL) 25 MG 24 hr tablet TAKE 1/2 TABLET(12.5 MG) BY MOUTH DAILY. 10/28/21   Argentina Donovan, PA-C  rosuvastatin (CRESTOR) 10 MG tablet Take 1 tablet (10 mg total) by mouth daily. 10/28/21 10/28/22  Argentina Donovan, PA-C  fluticasone (FLONASE) 50 MCG/ACT nasal spray Place 2 sprays into both nostrils daily. 07/15/19 04/01/20  Charlott Rakes, MD    ROS: Neg HEENT Neg resp Neg cardiac Neg GI Neg GU Neg psych Neg neuro  Objective:   Vitals:   10/28/21 0837  BP: 115/79  Pulse: 68  Resp: 16  SpO2: 98%  Weight: 232 lb 9.6 oz (105.5 kg)   Exam General appearance : Awake, alert, not in any distress. Speech Clear. Not toxic looking HEENT: Atraumatic and Normocephalic Neck: Supple, no JVD. No cervical lymphadenopathy.  Chest: Good air entry bilaterally, CTAB.  No rales/rhonchi/wheezing CVS: S1 S2 regular, no murmurs.  Back-neg SLRB.  R knee-ligaments stable Extremities: B/L Lower Ext shows no edema, both legs are warm to touch Neurology: Awake alert, and oriented X 3, CN II-XII intact, Non focal Skin: No Rash  Data Review Lab Results  Component Value Date   HGBA1C 5.8 12/13/2018   HGBA1C 5.4 05/29/2017   HGBA1C 5.5 12/10/2013    Assessment & Plan   1. Essential hypertension Controlled-continue - metoprolol  succinate (TOPROL-XL) 25 MG 24 hr tablet; TAKE 1/2 TABLET(12.5 MG) BY MOUTH DAILY.  Dispense: 15 tablet; Refill: 6 - Comprehensive metabolic panel - CBC with Differential/Platelet  2. Mild persistent asthma without complication stable - budesonide-formoterol (SYMBICORT) 160-4.5 MCG/ACT inhaler; Inhale 2 puffs into the lungs 2 (two) times daily.  Dispense: 1 each; Refill: 6 - albuterol (VENTOLIN HFA) 108 (90 Base) MCG/ACT inhaler; Inhale 2 puffs into the lungs every 6 (six) hours as needed for wheezing or shortness of breath.  Dispense: 18 g; Refill: 3  3. Primary osteoarthritis of right knee - diclofenac Sodium (VOLTAREN) 1 % GEL; Apply 2 g topically 4 (four) times daily.  Dispense: 100 g; Refill: 3 -meloxicam  4. Pre-diabetes I have had a lengthy discussion and provided education about insulin resistance and the intake of too much sugar/refined carbohydrates.  I have advised the patient to work at a goal of eliminating sugary drinks, candy, desserts, sweets, refined sugars, processed foods, and white carbohydrates.  The patient expresses understanding.  - Hemoglobin A1c - Comprehensive metabolic panel - CBC with Differential/Platelet  5. Chronic midline low back pain with left-sided sciatica No red flags-she will schedule with ortho - diclofenac Sodium (VOLTAREN) 1 % GEL; Apply 2 g topically 4 (four) times daily.  Dispense: 100 g; Refill: 3  6. Hyperlipidemia, unspecified hyperlipidemia type - Lipid panel - Comprehensive metabolic panel - CBC with Differential/Platelet  7. Paresthesias - Thyroid Panel With TSH - CBC with Differential/Platelet    Patient have been counseled extensively about nutrition and exercise. Other issues discussed during this visit include: low cholesterol diet, weight control and daily exercise, foot care, annual eye examinations at Ophthalmology, importance of adherence with medications and regular follow-up. We also discussed long term complications of  uncontrolled diabetes and hypertension.   Return in about 6 months (around 04/27/2022) for with PCP/chroic conditions.  The patient was given clear instructions to go to ER or return to medical center if symptoms don't improve, worsen or new problems develop. The patient verbalized understanding. The patient was told to call to get lab results if they haven't heard anything in the next week.      Freeman Caldron, PA-C [facility] [place], [place]   10/28/2021, 8:52 AM

## 2021-10-29 LAB — LIPID PANEL
Chol/HDL Ratio: 3.6 ratio (ref 0.0–4.4)
Cholesterol, Total: 164 mg/dL (ref 100–199)
HDL: 45 mg/dL (ref >39)
LDL Chol Calc (NIH): 103 mg/dL — ABNORMAL HIGH (ref 0–99)
Triglycerides: 88 mg/dL (ref 0–149)
VLDL Cholesterol Cal: 16 mg/dL (ref 5–40)

## 2021-10-29 LAB — COMPREHENSIVE METABOLIC PANEL
ALT: 31 IU/L (ref 0–32)
AST: 22 IU/L (ref 0–40)
Albumin/Globulin Ratio: 1.7 (ref 1.2–2.2)
Albumin: 4.2 g/dL (ref 3.8–4.9)
Alkaline Phosphatase: 134 IU/L — ABNORMAL HIGH (ref 44–121)
BUN/Creatinine Ratio: 23 (ref 9–23)
BUN: 13 mg/dL (ref 6–24)
Bilirubin Total: 0.4 mg/dL (ref 0.0–1.2)
CO2: 25 mmol/L (ref 20–29)
Calcium: 9.4 mg/dL (ref 8.7–10.2)
Chloride: 104 mmol/L (ref 96–106)
Creatinine, Ser: 0.57 mg/dL (ref 0.57–1.00)
Globulin, Total: 2.5 g/dL (ref 1.5–4.5)
Glucose: 83 mg/dL (ref 70–99)
Potassium: 3.7 mmol/L (ref 3.5–5.2)
Sodium: 144 mmol/L (ref 134–144)
Total Protein: 6.7 g/dL (ref 6.0–8.5)
eGFR: 106 mL/min/{1.73_m2} (ref >59)

## 2021-10-29 LAB — CBC WITH DIFFERENTIAL/PLATELET
Basophils Absolute: 0 10*3/uL (ref 0.0–0.2)
Basos: 1 %
EOS (ABSOLUTE): 0.1 10*3/uL (ref 0.0–0.4)
Eos: 3 %
Hematocrit: 38.4 % (ref 34.0–46.6)
Hemoglobin: 12.8 g/dL (ref 11.1–15.9)
Immature Grans (Abs): 0 10*3/uL (ref 0.0–0.1)
Immature Granulocytes: 0 %
Lymphocytes Absolute: 2 10*3/uL (ref 0.7–3.1)
Lymphs: 37 %
MCH: 27.3 pg (ref 26.6–33.0)
MCHC: 33.3 g/dL (ref 31.5–35.7)
MCV: 82 fL (ref 79–97)
Monocytes Absolute: 0.5 10*3/uL (ref 0.1–0.9)
Monocytes: 10 %
Neutrophils Absolute: 2.7 10*3/uL (ref 1.4–7.0)
Neutrophils: 49 %
Platelets: 297 10*3/uL (ref 150–450)
RBC: 4.69 x10E6/uL (ref 3.77–5.28)
RDW: 13.1 % (ref 11.7–15.4)
WBC: 5.5 10*3/uL (ref 3.4–10.8)

## 2021-10-29 LAB — THYROID PANEL WITH TSH
Free Thyroxine Index: 1.9 (ref 1.2–4.9)
T3 Uptake Ratio: 23 % — ABNORMAL LOW (ref 24–39)
T4, Total: 8.4 ug/dL (ref 4.5–12.0)
TSH: 1.89 u[IU]/mL (ref 0.450–4.500)

## 2021-10-29 LAB — HEMOGLOBIN A1C
Est. average glucose Bld gHb Est-mCnc: 123 mg/dL
Hgb A1c MFr Bld: 5.9 % — ABNORMAL HIGH (ref 4.8–5.6)

## 2021-11-01 ENCOUNTER — Encounter: Payer: Self-pay | Admitting: *Deleted

## 2021-11-02 ENCOUNTER — Ambulatory Visit: Payer: Self-pay | Admitting: *Deleted

## 2021-11-02 NOTE — Telephone Encounter (Signed)
Pt called in and was given the lab result message from [facility], PA-C dated 10/31/2021 at 5:32 PM.  No questions from pt. Reason for Disposition  Health Information question, no triage required and triager able to answer question  Answer Assessment - Initial Assessment Questions 1. REASON FOR CALL or QUESTION: "What is your reason for calling today?" or "How can I best help you?" or "What question do you have that I can help answer?"     Lab result message given.  Protocols used: Information Only Call - No Triage-A-AH

## 2022-01-02 ENCOUNTER — Encounter: Payer: Self-pay | Admitting: *Deleted

## 2022-01-02 NOTE — Congregational Nurse Program (Signed)
Needs help setting up My chart a link was sent to her phone but needs additional help with setting up my chart. Advised to contact her MD office tomorrow to set up a medication appt for her concerns, gave the the phone number. States she goes to [facility] on [address]. Dewain Penning M ? ?

## 2022-01-03 ENCOUNTER — Encounter: Payer: Self-pay | Admitting: *Deleted

## 2022-01-03 ENCOUNTER — Telehealth: Payer: Self-pay

## 2022-01-03 NOTE — Telephone Encounter (Signed)
Can you please schedule a virtual visit for her and we can discuss this and available options?  Thank you ?

## 2022-01-03 NOTE — Telephone Encounter (Signed)
Message received from [facility], RN/CNP: ? ?she expressed having some side effects of there medication she recently started (Mobic and Crestor). She does not quite understand the Mobic and believes it's causing her to gain weight, become tired. She also reported that her Crestor is keeping her up at night. She requested, and I suggested, she communicate potential side effects of the medication, especially Crestor with her provider as she is not consent with taking her medication due to her beliefs.  ? ? ? ? ? ?Dr Alvis Lemmings, please advise. You have not seen her in almost 2 years.  She had an appointment with Georgian Co, PA on 10/28/2021. Do you want to see the patient in person, or would a telephone visit be sufficient? ? ?I called patient  # [phone] and left her a message requesting a call back  ?

## 2022-01-04 ENCOUNTER — Ambulatory Visit: Payer: Self-pay | Admitting: *Deleted

## 2022-01-04 NOTE — Telephone Encounter (Signed)
Summary: ? UTI  ? Patient experiencing back pain, and when she urinates it burns for awhile, symptoms have worsen and now experiencing side pain. Practice has no available appointments until April   ?  ? ?Reason for Disposition ? Age > 50 years ? ?Answer Assessment - Initial Assessment Questions ?1. SEVERITY: "How bad is the pain?"  (e.g., Scale 1-10; mild, moderate, or severe) ?  - MILD (1-3): complains slightly about urination hurting ?  - MODERATE (4-7): interferes with normal activities   ?  - SEVERE (8-10): excruciating, unwilling or unable to urinate because of the pain  ?    mild ?2. FREQUENCY: "How many times have you had painful urination today?"  ?    Getting worse- "strong urine", pain in back and sides ?3. PATTERN: "Is pain present every time you urinate or just sometimes?"  ?    Every time urinates ?4. ONSET: "When did the painful urination start?"  ?    Over 1 week ?5. FEVER: "Do you have a fever?" If Yes, ask: "What is your temperature, how was it measured, and when did it start?" ?    no ?6. PAST UTI: "Have you had a urine infection before?" If Yes, ask: "When was the last time?" and "What happened that time?"  ?    no ?7. CAUSE: "What do you think is causing the painful urination?"  (e.g., UTI, scratch, Herpes sore) ?    UTI possible ?8. OTHER SYMPTOMS: "Do you have any other symptoms?" (e.g., flank pain, vaginal discharge, genital sores, urgency, blood in urine) ?    Flank pain, urinary pain, dark urine ?9. PREGNANCY: "Is there any chance you are pregnant?" "When was your last menstrual period?" ?    *No Answer* ? ?Protocols used: Urination Pain - Female-A-AH ? ?

## 2022-01-04 NOTE — Telephone Encounter (Signed)
?  Chief Complaint: painful urination ?Symptoms: pain with urination, dark urine, back pain ?Frequency: every time- symptoms getting worse ?Pertinent Negatives: Patient denies fever ?Disposition: [] ED /[] Urgent Care (no appt availability in office) / [x] Appointment(In office/virtual)/ []  [facility]/ [] Home Care/ [] Refused Recommended Disposition /[] [facility]/ []  Follow-up with PCP ?Additional Notes: contacted [facility]-  appointment scheduled ?

## 2022-01-04 NOTE — Telephone Encounter (Signed)
I called her this morning and left a message requesting a call back.  ? ?I see now that she called [facility] this morning to report UTI symptoms and she has been scheduled for an appointment with you tomorrow- 01/05/2022  ?

## 2022-01-05 ENCOUNTER — Ambulatory Visit: Payer: Medicaid Other | Attending: Family Medicine | Admitting: Family Medicine

## 2022-01-05 ENCOUNTER — Other Ambulatory Visit: Payer: Self-pay

## 2022-01-05 VITALS — BP 122/79 | HR 76 | Ht 65.0 in | Wt 239.0 lb

## 2022-01-05 DIAGNOSIS — M5442 Lumbago with sciatica, left side: Secondary | ICD-10-CM | POA: Diagnosis not present

## 2022-01-05 DIAGNOSIS — Z9189 Other specified personal risk factors, not elsewhere classified: Secondary | ICD-10-CM | POA: Diagnosis not present

## 2022-01-05 DIAGNOSIS — R300 Dysuria: Secondary | ICD-10-CM | POA: Diagnosis not present

## 2022-01-05 DIAGNOSIS — G8929 Other chronic pain: Secondary | ICD-10-CM

## 2022-01-05 DIAGNOSIS — R011 Cardiac murmur, unspecified: Secondary | ICD-10-CM

## 2022-01-05 DIAGNOSIS — R3129 Other microscopic hematuria: Secondary | ICD-10-CM

## 2022-01-05 LAB — POCT URINALYSIS DIP (CLINITEK)
Bilirubin, UA: NEGATIVE
Glucose, UA: NEGATIVE mg/dL
Ketones, POC UA: NEGATIVE mg/dL
Leukocytes, UA: NEGATIVE
Nitrite, UA: NEGATIVE
POC PROTEIN,UA: NEGATIVE
Spec Grav, UA: >=1.03 — AB (ref 1.010–1.025)
Urobilinogen, UA: 0.2 E.U./dL
pH, UA: 5.5 (ref 5.0–8.0)

## 2022-01-05 MED ORDER — NAPROXEN 500 MG PO TABS
500.0000 mg | ORAL_TABLET | Freq: Two times a day (BID) | ORAL | 3 refills | Status: DC
  Filled 2022-01-05: qty 60, 30d supply, fill #0

## 2022-01-05 MED ORDER — ATORVASTATIN CALCIUM 20 MG PO TABS
20.0000 mg | ORAL_TABLET | Freq: Every day | ORAL | 3 refills | Status: DC
  Filled 2022-01-05: qty 30, 30d supply, fill #0

## 2022-01-05 NOTE — Patient Instructions (Signed)

## 2022-01-05 NOTE — Progress Notes (Signed)
? ?Subjective:  ?Patient ID: [name], female    DOB: [birth year]  Age: 56 y.o. MRN: [id number] ? ?CC: Urinary Tract Infection ? ? ?HPI ?[name] is a 56 y.o. year old female with a history of Hypertension, Asthma, GERD, Depression and low back pain seen for an acute visit.  She had a visit for chronic disease management 2 months ago with the physician assistant. ? ?Interval History: ?She complains of of dysuria  and urinary frequency for 2-3 weeks.  Also complains of mid lumbar spine pain which is chronic. ?Low back pain does not radiate. ?Complains that Crestor and Meloxicam make her feel fatigued and when she stopped taking them her energy level improved. ?Past Medical History:  ?Diagnosis Date  ? Asthma   ? Gastric reflux   ? History of blood transfusion yrs agio, none recent  ? Hypertension   ? Osteoarthritis   ? Sleep apnea   ? cpap machine has not used since 2014 due to machine/tubing in bad shape   ? Syncope and collapse yrs ago  ? ? ?Past Surgical History:  ?Procedure Laterality Date  ? CARDIAC CATHETERIZATION    ? more than 5 years ago  ? CARDIAC CATHETERIZATION  06-07-2012  ? [facility]: Normal coronary arteries. No significant LVOT/aortic valve gradient  ? CHOLECYSTECTOMY    ? COLONOSCOPY WITH PROPOFOL N/A 02/27/2014  ? Procedure: COLONOSCOPY WITH PROPOFOL;  Surgeon: Garlan Fair, MD;  Location: [facility];  Service: Endoscopy;  Laterality: N/A;  ? ESOPHAGOGASTRODUODENOSCOPY (EGD) WITH PROPOFOL N/A 02/27/2014  ? Procedure: ESOPHAGOGASTRODUODENOSCOPY (EGD) WITH PROPOFOL;  Surgeon: Garlan Fair, MD;  Location: [facility];  Service: Endoscopy;  Laterality: N/A;  ? EXPLORATORY LAPAROTOMY  09/2001  ? ELAP, LOA, R partial oophorectomy, repair of bowel injury  ? VAGINAL HYSTERECTOMY  2001  ? Adnexa left in place  ? ? ?No family history on file. ? ?Social History  ? ?Socioeconomic History  ? Marital status: Single  ?  Spouse name: Not on file  ? Number of children: Not on file  ? Years of education:  Not on file  ? Highest education level: Not on file  ?Occupational History  ? Not on file  ?Tobacco Use  ? Smoking status: Never  ? Smokeless tobacco: Never  ?Vaping Use  ? Vaping Use: Never used  ?Substance and Sexual Activity  ? Alcohol use: No  ? Drug use: No  ? Sexual activity: Never  ?  Birth control/protection: Surgical  ?Other Topics Concern  ? Not on file  ?Social History Narrative  ? Not on file  ? ?Social Determinants of Health  ? ?Financial Resource Strain: Not on file  ?Food Insecurity: Not on file  ?Transportation Needs: Not on file  ?Physical Activity: Inactive  ? Days of Exercise per Week: 0 days  ? Minutes of Exercise per Session: 0 min  ?Stress: Not on file  ?Social Connections: Not on file  ? ? ?Allergies  ?Allergen Reactions  ? Tramadol Hives, Itching and Swelling  ?  Pt states after medication intake she had to go to the ER.   ? ? ?Outpatient Medications Prior to Visit  ?Medication Sig Dispense Refill  ? acetaminophen-codeine (TYLENOL #3) 300-30 MG tablet Take 1 tablet by mouth every 4 (four) hours as needed for moderate pain. 30 tablet 0  ? albuterol (VENTOLIN HFA) 108 (90 Base) MCG/ACT inhaler Inhale 2 puffs into the lungs every 6 (six) hours as needed for wheezing or shortness of breath. 18 g 3  ?  Blood Pressure KIT Use as instructed ?Dx: Hypertension 1 kit 0  ? budesonide-formoterol (SYMBICORT) 160-4.5 MCG/ACT inhaler Inhale 2 puffs into the lungs 2 (two) times daily. 1 each 6  ? cetirizine (ZYRTEC) 10 MG tablet Take 1 tablet (10 mg total) by mouth daily. 30 tablet 2  ? diclofenac Sodium (VOLTAREN) 1 % GEL Apply 2 g topically 4 (four) times daily. 100 g 3  ? metoprolol succinate (TOPROL-XL) 25 MG 24 hr tablet TAKE 1/2 TABLET(12.5 MG) BY MOUTH DAILY. 15 tablet 6  ? meloxicam (MOBIC) 15 MG tablet Take 1 tablet (15 mg total) by mouth daily. 30 tablet 0  ? rosuvastatin (CRESTOR) 10 MG tablet Take 1 tablet (10 mg total) by mouth daily. 90 tablet 1  ? ?No facility-administered medications prior to  visit.  ? ? ? ?ROS ?Review of Systems  ?Constitutional:  Negative for activity change, appetite change and fatigue.  ?HENT:  Negative for congestion, sinus pressure and sore throat.   ?Eyes:  Negative for visual disturbance.  ?Respiratory:  Negative for cough, chest tightness, shortness of breath and wheezing.   ?Cardiovascular:  Negative for chest pain and palpitations.  ?Gastrointestinal:  Negative for abdominal distention, abdominal pain and constipation.  ?Endocrine: Negative for polydipsia.  ?Genitourinary:  Positive for dysuria and frequency.  ?Musculoskeletal:  Positive for back pain. Negative for arthralgias.  ?Skin:  Negative for rash.  ?Neurological:  Negative for tremors, light-headedness and numbness.  ?Hematological:  Does not bruise/bleed easily.  ?Psychiatric/Behavioral:  Negative for agitation and behavioral problems.   ? ?Objective:  ?BP 122/79   Pulse 76   Ht $R'5\' 5"'Baxter$  (1.651 m)   Wt 239 lb (108.4 kg)   SpO2 100%   BMI 39.77 kg/m?  ? ? ?  01/05/2022  ?  1:39 PM 10/28/2021  ?  8:37 AM 08/26/2021  ?  3:09 PM  ?BP/Weight  ?Systolic BP 163 845   ?Diastolic BP 79 79   ?Wt. (Lbs) 239 232.6 236.4  ?BMI 39.77 kg/m2 38.71 kg/m2 39.34 kg/m2  ? ? ? ? ?Physical Exam ?Constitutional:   ?   Appearance: She is well-developed.  ?Cardiovascular:  ?   Rate and Rhythm: Normal rate.  ?   Heart sounds: Murmur heard.  ?Pulmonary:  ?   Effort: Pulmonary effort is normal.  ?   Breath sounds: Normal breath sounds. No wheezing or rales.  ?Chest:  ?   Chest wall: No tenderness.  ?Abdominal:  ?   General: Bowel sounds are normal. There is no distension.  ?   Palpations: Abdomen is soft. There is no mass.  ?   Tenderness: There is no abdominal tenderness. There is right CVA tenderness and left CVA tenderness.  ?Musculoskeletal:     ?   General: Normal range of motion.  ?   Right lower leg: No edema.  ?   Left lower leg: No edema.  ?   Comments: Slightly mid lumbar spine tenderness; negative straight leg raise bilaterally   ?Neurological:  ?   Mental Status: She is alert and oriented to person, place, and time.  ?Psychiatric:     ?   Mood and Affect: Mood normal.  ? ? ? ?  Latest Ref Rng & Units 10/28/2021  ?  8:56 AM 07/15/2019  ? 11:41 AM 12/13/2018  ? 11:11 AM  ?CMP  ?Glucose 70 - 99 mg/dL 83   80   79    ?BUN 6 - 24 mg/dL $Remove'13   9   11    'secoWnh$ ?  Creatinine 0.57 - 1.00 mg/dL 0.57   0.54   0.53    ?Sodium 134 - 144 mmol/L 144   143   141    ?Potassium 3.5 - 5.2 mmol/L 3.7   3.7   3.4    ?Chloride 96 - 106 mmol/L 104   108   101    ?CO2 20 - 29 mmol/L $RemoveB'25   25   25    'uZEJmVpR$ ?Calcium 8.7 - 10.2 mg/dL 9.4   9.2   9.7    ?Total Protein 6.0 - 8.5 g/dL 6.7   6.7   6.4    ?Total Bilirubin 0.0 - 1.2 mg/dL 0.4   0.5   0.4    ?Alkaline Phos 44 - 121 IU/L 134   119   116    ?AST 0 - 40 IU/L $Remov'22   14   14    'TJrIYQ$ ?ALT 0 - 32 IU/L 31   26   33    ? ? ?Lipid Panel  ?   ?Component Value Date/Time  ? CHOL 164 10/28/2021 0856  ? CHOL 167 03/18/2012 0522  ? TRIG 88 10/28/2021 0856  ? TRIG 58 03/18/2012 0522  ? HDL 45 10/28/2021 0856  ? HDL 60 03/18/2012 0522  ? CHOLHDL 3.6 10/28/2021 0856  ? CHOLHDL 3.4 05/04/2016 1004  ? VLDL 18 05/04/2016 1004  ? VLDL 12 03/18/2012 0522  ? [place] 103 (H) 10/28/2021 0856  ? [place] 95 03/18/2012 0522  ? ? ?CBC ?   ?Component Value Date/Time  ? WBC 5.5 10/28/2021 0856  ? WBC 6.4 11/14/2016 1400  ? RBC 4.69 10/28/2021 0856  ? RBC 4.64 11/14/2016 1400  ? HGB 12.8 10/28/2021 0856  ? HCT 38.4 10/28/2021 0856  ? PLT 297 10/28/2021 0856  ? MCV 82 10/28/2021 0856  ? MCV 81 05/19/2013 2105  ? MCH 27.3 10/28/2021 0856  ? MCH 27.2 11/14/2016 1400  ? MCHC 33.3 10/28/2021 0856  ? MCHC 32.6 11/14/2016 1400  ? RDW 13.1 10/28/2021 0856  ? RDW 13.8 05/19/2013 2105  ? LYMPHSABS 2.0 10/28/2021 0856  ? LYMPHSABS 1.4 03/17/2012 0549  ? MONOABS 448 11/14/2016 1400  ? MONOABS 0.3 03/17/2012 0549  ? EOSABS 0.1 10/28/2021 0856  ? EOSABS 0.1 03/17/2012 0549  ? BASOSABS 0.0 10/28/2021 0856  ? BASOSABS 0.0 03/17/2012 0549  ? ? ?Lab Results  ?Component Value Date  ?  HGBA1C 5.9 (H) 10/28/2021  ? ? ?The 10-year ASCVD risk score (Arnett DK, et al., 2019) is: 11% ?  Values used to calculate the score: ?    Age: 6 years ?    Sex: Female ?    Is Non-Hispanic African American: Yes

## 2022-01-12 ENCOUNTER — Telehealth: Payer: Self-pay | Admitting: Family Medicine

## 2022-01-12 NOTE — Telephone Encounter (Signed)
Copied from [place] [id number]. Topic: General - Other >> Jan 11, 2022  4:45 PM Antonieta Iba C wrote: Reason for CRM: Latonya with imaging called in for assistance. She says that pt has imaging is scheduled for 4/7 and needs authorization.   Please assist further.

## 2022-01-12 NOTE — Telephone Encounter (Signed)
Pre Cert was called and PA will be done by [facility]. ?

## 2022-01-14 ENCOUNTER — Ambulatory Visit
Admission: RE | Admit: 2022-01-14 | Discharge: 2022-01-14 | Disposition: A | Discharge status: Home / Self Care | Payer: Medicaid Other | Source: Ambulatory Visit | Attending: Family Medicine | Admitting: Family Medicine

## 2022-01-14 DIAGNOSIS — R3129 Other microscopic hematuria: Secondary | ICD-10-CM

## 2022-02-23 ENCOUNTER — Encounter: Payer: Self-pay | Admitting: Orthopaedic Surgery

## 2022-02-23 ENCOUNTER — Ambulatory Visit (INDEPENDENT_AMBULATORY_CARE_PROVIDER_SITE_OTHER): Payer: Medicaid Other | Admitting: Orthopaedic Surgery

## 2022-02-23 ENCOUNTER — Telehealth: Payer: Self-pay

## 2022-02-23 VITALS — Wt 232.0 lb

## 2022-02-23 DIAGNOSIS — M170 Bilateral primary osteoarthritis of knee: Secondary | ICD-10-CM

## 2022-02-23 DIAGNOSIS — M25561 Pain in right knee: Secondary | ICD-10-CM

## 2022-02-23 DIAGNOSIS — G8929 Other chronic pain: Secondary | ICD-10-CM | POA: Diagnosis not present

## 2022-02-23 DIAGNOSIS — M1712 Unilateral primary osteoarthritis, left knee: Secondary | ICD-10-CM

## 2022-02-23 DIAGNOSIS — M25562 Pain in left knee: Secondary | ICD-10-CM | POA: Diagnosis not present

## 2022-02-23 DIAGNOSIS — M1711 Unilateral primary osteoarthritis, right knee: Secondary | ICD-10-CM

## 2022-02-23 MED ORDER — METHYLPREDNISOLONE ACETATE 40 MG/ML IJ SUSP
40.0000 mg | INTRAMUSCULAR | Status: AC | PRN
  Administered 2022-02-23: 40 mg via INTRA_ARTICULAR

## 2022-02-23 MED ORDER — LIDOCAINE HCL 1 % IJ SOLN
3.0000 mL | INTRAMUSCULAR | Status: AC | PRN
  Administered 2022-02-23: 3 mL

## 2022-02-23 NOTE — Telephone Encounter (Signed)
Noted  

## 2022-02-23 NOTE — Progress Notes (Signed)
? ?Office Visit Note ?  ?Patient: [name]           ?Date of Birth: 1966/08/20           ?MRN: [id number] ?Visit Date: 02/23/2022 ?             ?Requested by: Hoy Register, MD ?[address] [place] ?[address] ?[place],  [place] [address] ?PCP: Hoy Register, MD ? ? ?Assessment & Plan: ?Visit Diagnoses:  ?1. Primary osteoarthritis of right knee   ?2. Primary osteoarthritis of left knee   ?3. Chronic pain of left knee   ?4. Chronic pain of right knee   ? ? ?Plan: Per the patient's wish I did provide a steroid injection in both knees which he tolerated well.  I recommended just over-the-counter knee sleeves for her knees which she can get at [facility] or [facility] or even online.  She is a good candidate for hyaluronic acid for her knees and she has had this in the past and has been well over 6 months.  We will order this for both knees today to treat the pain from osteoarthritis and see her in follow-up once those are hopefully improved. ? ?Follow-Up Instructions: No follow-ups on file.  ? ?Orders:  ?Orders Placed This Encounter  ?Procedures  ? Large Joint Inj: R knee  ? Large Joint Inj: L knee  ? ?No orders of the defined types were placed in this encounter. ? ? ? ? Procedures: ?Large Joint Inj: R knee on 02/23/2022 8:32 AM ?Indications: diagnostic evaluation and pain ?Details: 22 G 1.5 in needle, superolateral approach ? ?Arthrogram: No ? ?Medications: 3 mL lidocaine 1 %; 40 mg methylPREDNISolone acetate 40 MG/ML ?Outcome: tolerated well, no immediate complications ?Procedure, treatment alternatives, risks and benefits explained, specific risks discussed. Consent was given by the patient. Immediately prior to procedure a time out was called to verify the correct patient, procedure, equipment, support staff and site/side marked as required. Patient was prepped and draped in the usual sterile fashion.  ? ? ?Large Joint Inj: L knee on 02/23/2022 8:32 AM ?Indications: diagnostic evaluation and pain ?Details: 22 G 1.5 in  needle, superolateral approach ? ?Arthrogram: No ? ?Medications: 3 mL lidocaine 1 %; 40 mg methylPREDNISolone acetate 40 MG/ML ?Outcome: tolerated well, no immediate complications ?Procedure, treatment alternatives, risks and benefits explained, specific risks discussed. Consent was given by the patient. Immediately prior to procedure a time out was called to verify the correct patient, procedure, equipment, support staff and site/side marked as required. Patient was prepped and draped in the usual sterile fashion.  ? ? ? ? ?Clinical Data: ?No additional findings. ? ? ?Subjective: ?Chief Complaint  ?Patient presents with  ? Right Knee - Pain  ? Left Knee - Pain  ?The patient is well-known to [place].  She has chronic bilateral knee pain and known osteoarthritis in both her knees.  She has been on a weight loss journey and before her BMI was well over 40.  Today is down to 38.61.  She does have chronic pain in both her knees and is requesting steroid injections today.  She is continue to lose weight.  She has had no acute change in her medical status.  She is talked about the possibility knee braces for her knees. ? ?HPI ? ?Review of Systems ? ? ?Objective: ?Vital Signs: Wt 232 lb (105.2 kg)   BMI 38.61 kg/m?  ? ?Physical Exam ?She is alert and orient x3 and in no acute distress ?Ortho  Exam ?Examination of both her knees shows slight varus malalignment and global tenderness but no effusion.  She has excellent range of motion of both knees but they are painful. ?Specialty Comments:  ?No specialty comments available. ? ?Imaging: ?No results found. ? ? ?PMFS History: ?Patient Active Problem List  ? Diagnosis Date Noted  ? Primary osteoarthritis of right knee 07/01/2021  ? Primary osteoarthritis of left knee 07/01/2021  ? Developmental delay 09/17/2020  ? Pre-diabetes 07/07/2020  ? Dyspnea on exertion 07/07/2020  ? Abnormal ECG 07/07/2020  ? Obesity 01/29/2018  ? Acute left-sided low back pain with left-sided sciatica  11/27/2017  ? Low back pain 10/30/2017  ? Itching with irritation 06/23/2016  ? Gastric reflux 05/04/2016  ? Asthma 05/04/2016  ? Essential hypertension 07/23/2015  ? Mild depression 07/23/2015  ? Chest pain 06/06/2012  ? Murmur, cardiac 06/06/2012  ? ?Past Medical History:  ?Diagnosis Date  ? Asthma   ? Gastric reflux   ? History of blood transfusion yrs agio, none recent  ? Hypertension   ? Osteoarthritis   ? Sleep apnea   ? cpap machine has not used since 2014 due to machine/tubing in bad shape   ? Syncope and collapse yrs ago  ?  ?History reviewed. No pertinent family history.  ?Past Surgical History:  ?Procedure Laterality Date  ? CARDIAC CATHETERIZATION    ? more than 5 years ago  ? CARDIAC CATHETERIZATION  06-07-2012  ? [facility]: Normal coronary arteries. No significant LVOT/aortic valve gradient  ? CHOLECYSTECTOMY    ? COLONOSCOPY WITH PROPOFOL N/A 02/27/2014  ? Procedure: COLONOSCOPY WITH PROPOFOL;  Surgeon: Charolett Bumpers, MD;  Location: [facility];  Service: Endoscopy;  Laterality: N/A;  ? ESOPHAGOGASTRODUODENOSCOPY (EGD) WITH PROPOFOL N/A 02/27/2014  ? Procedure: ESOPHAGOGASTRODUODENOSCOPY (EGD) WITH PROPOFOL;  Surgeon: Charolett Bumpers, MD;  Location: [facility];  Service: Endoscopy;  Laterality: N/A;  ? EXPLORATORY LAPAROTOMY  09/2001  ? ELAP, LOA, R partial oophorectomy, repair of bowel injury  ? VAGINAL HYSTERECTOMY  2001  ? Adnexa left in place  ? ?Social History  ? ?Occupational History  ? Not on file  ?Tobacco Use  ? Smoking status: Never  ? Smokeless tobacco: Never  ?Vaping Use  ? Vaping Use: Never used  ?Substance and Sexual Activity  ? Alcohol use: No  ? Drug use: No  ? Sexual activity: Never  ?  Birth control/protection: Surgical  ? ? ? ? ? ? ?

## 2022-02-23 NOTE — Telephone Encounter (Signed)
Bilateral knee gel injections 

## 2022-03-16 ENCOUNTER — Ambulatory Visit: Payer: Self-pay

## 2022-03-16 ENCOUNTER — Telehealth: Payer: Medicaid Other | Admitting: Physician Assistant

## 2022-03-16 ENCOUNTER — Telehealth: Payer: Medicaid Other | Admitting: Family Medicine

## 2022-03-16 DIAGNOSIS — B9689 Other specified bacterial agents as the cause of diseases classified elsewhere: Secondary | ICD-10-CM | POA: Diagnosis not present

## 2022-03-16 DIAGNOSIS — J019 Acute sinusitis, unspecified: Secondary | ICD-10-CM

## 2022-03-16 MED ORDER — DOXYCYCLINE HYCLATE 100 MG PO TABS: 100.0000 mg | ORAL_TABLET | Freq: Two times a day (BID) | ORAL | 0 refills | Status: DC

## 2022-03-16 MED ORDER — BENZONATATE 100 MG PO CAPS: 100.0000 mg | ORAL_CAPSULE | Freq: Three times a day (TID) | ORAL | 0 refills | Status: DC | PRN

## 2022-03-16 MED ORDER — IPRATROPIUM BROMIDE 0.03 % NA SOLN: 2.0000 | Freq: Two times a day (BID) | NASAL | 0 refills | Status: DC

## 2022-03-16 NOTE — Progress Notes (Signed)
The patient no-showed for appointment despite this provider sending direct link, reaching out via phone with no response and waiting for at least 10 minutes from appointment time for patient to join. They will be marked as a NS for this appointment/time.   Zaviyar Rahal M Hurley Sobel, NP    

## 2022-03-16 NOTE — Progress Notes (Signed)
Virtual Visit Consent   [name], you are scheduled for a virtual visit with a [facility] provider today. Just as with appointments in the office, your consent must be obtained to participate. Your consent will be active for this visit and any virtual visit you may have with one of our providers in the next 365 days. If you have a MyChart account, a copy of this consent can be sent to you electronically.  As this is a virtual visit, video technology does not allow for your provider to perform a traditional examination. This may limit your provider's ability to fully assess your condition. If your provider identifies any concerns that need to be evaluated in person or the need to arrange testing (such as labs, EKG, etc.), we will make arrangements to do so. Although advances in technology are sophisticated, we cannot ensure that it will always work on either your end or our end. If the connection with a video visit is poor, the visit may have to be switched to a telephone visit. With either a video or telephone visit, we are not always able to ensure that we have a secure connection.  By engaging in this virtual visit, you consent to the provision of healthcare and authorize for your insurance to be billed (if applicable) for the services provided during this visit. Depending on your insurance coverage, you may receive a charge related to this service.  I need to obtain your verbal consent now. Are you willing to proceed with your visit today? [name] has provided verbal consent on 03/16/2022 for a virtual visit (video or telephone). Leeanne Rio, [place]  Date: 03/16/2022 1:24 PM  Virtual Visit via Video Note   I, Leeanne Rio, connected with  [name]  ([id number], 11-06-1965) on 03/16/22 at  1:15 PM EDT by a video-enabled telemedicine application and verified that I am speaking with the correct person using two identifiers.  Location: Patient: Virtual Visit Location  Patient: Home Provider: Virtual Visit Location Provider: Home Office   I discussed the limitations of evaluation and management by telemedicine and the availability of in person appointments. The patient expressed understanding and agreed to proceed.    History of Present Illness: [name] is a 56 y.o. who identifies as a female who was assigned female at birth, and is being seen today for URI symptoms starting over the past week. Notes initially with nasal congestion, runny nose and sore throat that has progressed with more sinus congestion and some sinus and ear pain. Denies fever, chills. Has taken OTC Zyrtec, Elderberry, Vitamin C.   HPI: HPI  Problems:  Patient Active Problem List   Diagnosis Date Noted   Primary osteoarthritis of right knee 07/01/2021   Primary osteoarthritis of left knee 07/01/2021   Developmental delay 09/17/2020   Pre-diabetes 07/07/2020   Dyspnea on exertion 07/07/2020   Abnormal ECG 07/07/2020   Obesity 01/29/2018   Acute left-sided low back pain with left-sided sciatica 11/27/2017   Low back pain 10/30/2017   Itching with irritation 06/23/2016   Gastric reflux 05/04/2016   Asthma 05/04/2016   Essential hypertension 07/23/2015   Mild depression 07/23/2015   Chest pain 06/06/2012   Murmur, cardiac 06/06/2012    Allergies:  Allergies  Allergen Reactions   Tramadol Hives, Itching and Swelling    Pt states after medication intake she had to go to the ER.    Medications:  Current Outpatient Medications:    benzonatate (TESSALON) 100 MG capsule, Take  1 capsule (100 mg total) by mouth 3 (three) times daily as needed for cough., Disp: 30 capsule, Rfl: 0   doxycycline (VIBRA-TABS) 100 MG tablet, Take 1 tablet (100 mg total) by mouth 2 (two) times daily., Disp: 20 tablet, Rfl: 0   ipratropium (ATROVENT) 0.03 % nasal spray, Place 2 sprays into both nostrils every 12 (twelve) hours., Disp: 30 mL, Rfl: 0   acetaminophen-codeine (TYLENOL #3) 300-30 MG  tablet, Take 1 tablet by mouth every 4 (four) hours as needed for moderate pain., Disp: 30 tablet, Rfl: 0   albuterol (VENTOLIN HFA) 108 (90 Base) MCG/ACT inhaler, Inhale 2 puffs into the lungs every 6 (six) hours as needed for wheezing or shortness of breath., Disp: 18 g, Rfl: 3   atorvastatin (LIPITOR) 20 MG tablet, Take 1 tablet (20 mg total) by mouth daily., Disp: 30 tablet, Rfl: 3   Blood Pressure KIT, Use as instructed Dx: Hypertension, Disp: 1 kit, Rfl: 0   budesonide-formoterol (SYMBICORT) 160-4.5 MCG/ACT inhaler, Inhale 2 puffs into the lungs 2 (two) times daily., Disp: 1 each, Rfl: 6   cetirizine (ZYRTEC) 10 MG tablet, Take 1 tablet (10 mg total) by mouth daily., Disp: 30 tablet, Rfl: 2   diclofenac Sodium (VOLTAREN) 1 % GEL, Apply 2 g topically 4 (four) times daily., Disp: 100 g, Rfl: 3   metoprolol succinate (TOPROL-XL) 25 MG 24 hr tablet, TAKE 1/2 TABLET(12.5 MG) BY MOUTH DAILY., Disp: 15 tablet, Rfl: 6   naproxen (NAPROSYN) 500 MG tablet, Take 1 tablet (500 mg total) by mouth 2 (two) times daily with a meal., Disp: 60 tablet, Rfl: 3  Observations/Objective: Patient is well-developed, well-nourished in no acute distress.  Resting comfortably at home.  Head is normocephalic, atraumatic.  No labored breathing. Speech is clear and coherent with logical content.  Patient is alert and oriented at baseline.   Assessment and Plan: 1. Acute bacterial sinusitis - doxycycline (VIBRA-TABS) 100 MG tablet; Take 1 tablet (100 mg total) by mouth 2 (two) times daily.  Dispense: 20 tablet; Refill: 0 - benzonatate (TESSALON) 100 MG capsule; Take 1 capsule (100 mg total) by mouth 3 (three) times daily as needed for cough.  Dispense: 30 capsule; Refill: 0 - ipratropium (ATROVENT) 0.03 % nasal spray; Place 2 sprays into both nostrils every 12 (twelve) hours.  Dispense: 30 mL; Refill: 0  Rx Doxycycline.  Increase fluids.  Rest.  Saline nasal spray.  Probiotic.  Mucinex as directed.  Humidifier in  bedroom. Tessalon and Atrovent spray per orders.  Call or return to clinic if symptoms are not improving.   Follow Up Instructions: I discussed the assessment and treatment plan with the patient. The patient was provided an opportunity to ask questions and all were answered. The patient agreed with the plan and demonstrated an understanding of the instructions.  A copy of instructions were sent to the patient via MyChart unless otherwise noted below.    The patient was advised to call back or seek an in-person evaluation if the symptoms worsen or if the condition fails to improve as anticipated.  Time:  I spent 8 minutes with the patient via telehealth technology discussing the above problems/concerns.    William Cody Martin, PA-C 

## 2022-03-16 NOTE — Telephone Encounter (Signed)
Pt has headache, sore throat, and her ears hurt. Anting appt today to be seen.  Please advise, as there are no appts.      Chief Complaint: Sore throat,ear pain Symptoms: Above Frequency: 2 weeks ago Pertinent Negatives: Patient denies fever Disposition: [] ED /[] Urgent Care (no appt availability in office) / [] Appointment(In office/virtual)/ [x]  [facility]/ [] Home Care/ [] Refused Recommended Disposition /[] [facility]/ []  Follow-up with PCP Additional Notes:   Reason for Disposition  Earache also present  Answer Assessment - Initial Assessment Questions 1. ONSET: "When did the throat start hurting?" (Hours or days ago)      2 weeks ago 2. SEVERITY: "How bad is the sore throat?" (Scale 1-10; mild, moderate or severe)   - MILD (1-3):  doesn't interfere with eating or normal activities   - MODERATE (4-7): interferes with eating some solids and normal activities   - SEVERE (8-10):  excruciating pain, interferes with most normal activities   - SEVERE DYSPHAGIA: can't swallow liquids, drooling     Moderate 3. STREP EXPOSURE: "Has there been any exposure to strep within the past week?" If Yes, ask: "What type of contact occurred?"      No 4.  VIRAL SYMPTOMS: "Are there any symptoms of a cold, such as a runny nose, cough, hoarse voice or red eyes?"      Headache, ear pain 5. FEVER: "Do you have a fever?" If Yes, ask: "What is your temperature, how was it measured, and when did it start?"     No 6. PUS ON THE TONSILS: "Is there pus on the tonsils in the back of your throat?"     No 7. OTHER SYMPTOMS: "Do you have any other symptoms?" (e.g., difficulty breathing, headache, rash)     N/a 8. PREGNANCY: "Is there any chance you are pregnant?" "When was your last menstrual period?"     no  Protocols used: Sore Throat-A-AH

## 2022-03-16 NOTE — Patient Instructions (Signed)
[name], thank you for joining Leeanne Rio, PA-C for today's virtual visit.  While this provider is not your primary care provider (PCP), if your PCP is located in our provider database this encounter information will be shared with them immediately following your visit.  Consent: (Patient) [name] provided verbal consent for this virtual visit at the beginning of the encounter.  Current Medications:  Current Outpatient Medications:    acetaminophen-codeine (TYLENOL #3) 300-30 MG tablet, Take 1 tablet by mouth every 4 (four) hours as needed for moderate pain., Disp: 30 tablet, Rfl: 0   albuterol (VENTOLIN HFA) 108 (90 Base) MCG/ACT inhaler, Inhale 2 puffs into the lungs every 6 (six) hours as needed for wheezing or shortness of breath., Disp: 18 g, Rfl: 3   atorvastatin (LIPITOR) 20 MG tablet, Take 1 tablet (20 mg total) by mouth daily., Disp: 30 tablet, Rfl: 3   Blood Pressure KIT, Use as instructed Dx: Hypertension, Disp: 1 kit, Rfl: 0   budesonide-formoterol (SYMBICORT) 160-4.5 MCG/ACT inhaler, Inhale 2 puffs into the lungs 2 (two) times daily., Disp: 1 each, Rfl: 6   cetirizine (ZYRTEC) 10 MG tablet, Take 1 tablet (10 mg total) by mouth daily., Disp: 30 tablet, Rfl: 2   diclofenac Sodium (VOLTAREN) 1 % GEL, Apply 2 g topically 4 (four) times daily., Disp: 100 g, Rfl: 3   metoprolol succinate (TOPROL-XL) 25 MG 24 hr tablet, TAKE 1/2 TABLET(12.5 MG) BY MOUTH DAILY., Disp: 15 tablet, Rfl: 6   naproxen (NAPROSYN) 500 MG tablet, Take 1 tablet (500 mg total) by mouth 2 (two) times daily with a meal., Disp: 60 tablet, Rfl: 3   Medications ordered in this encounter:  No orders of the defined types were placed in this encounter.    *If you need refills on other medications prior to your next appointment, please contact your pharmacy*  Follow-Up: Call back or seek an in-person evaluation if the symptoms worsen or if the condition fails to improve as anticipated.  Other  Instructions Please take antibiotic as directed.  Increase fluid intake.  Use Saline nasal spray.  Take a daily multivitamin. Continue the Zyrtec. Start the prescription nasal spray and the cough medication as directed..  Place a humidifier in the bedroom.  Please call or return clinic if symptoms are not improving.  Sinusitis Sinusitis is redness, soreness, and swelling (inflammation) of the paranasal sinuses. Paranasal sinuses are air pockets within the bones of your face (beneath the eyes, the middle of the forehead, or above the eyes). In healthy paranasal sinuses, mucus is able to drain out, and air is able to circulate through them by way of your nose. However, when your paranasal sinuses are inflamed, mucus and air can become trapped. This can allow bacteria and other germs to grow and cause infection. Sinusitis can develop quickly and last only a short time (acute) or continue over a long period (chronic). Sinusitis that lasts for more than 12 weeks is considered chronic.  CAUSES  Causes of sinusitis include: Allergies. Structural abnormalities, such as displacement of the cartilage that separates your nostrils (deviated septum), which can decrease the air flow through your nose and sinuses and affect sinus drainage. Functional abnormalities, such as when the small hairs (cilia) that line your sinuses and help remove mucus do not work properly or are not present. SYMPTOMS  Symptoms of acute and chronic sinusitis are the same. The primary symptoms are pain and pressure around the affected sinuses. Other symptoms include: Upper toothache. Earache. Headache.  Bad breath. Decreased sense of smell and taste. A cough, which worsens when you are lying flat. Fatigue. Fever. Thick drainage from your nose, which often is green and may contain pus (purulent). Swelling and warmth over the affected sinuses. DIAGNOSIS  Your caregiver will perform a physical exam. During the exam, your caregiver  may: Look in your nose for signs of abnormal growths in your nostrils (nasal polyps). Tap over the affected sinus to check for signs of infection. View the inside of your sinuses (endoscopy) with a special imaging device with a light attached (endoscope), which is inserted into your sinuses. If your caregiver suspects that you have chronic sinusitis, one or more of the following tests may be recommended: Allergy tests. Nasal culture A sample of mucus is taken from your nose and sent to a lab and screened for bacteria. Nasal cytology A sample of mucus is taken from your nose and examined by your caregiver to determine if your sinusitis is related to an allergy. TREATMENT  Most cases of acute sinusitis are related to a viral infection and will resolve on their own within 10 days. Sometimes medicines are prescribed to help relieve symptoms (pain medicine, decongestants, nasal steroid sprays, or saline sprays).  However, for sinusitis related to a bacterial infection, your caregiver will prescribe antibiotic medicines. These are medicines that will help kill the bacteria causing the infection.  Rarely, sinusitis is caused by a fungal infection. In theses cases, your caregiver will prescribe antifungal medicine. For some cases of chronic sinusitis, surgery is needed. Generally, these are cases in which sinusitis recurs more than 3 times per year, despite other treatments. HOME CARE INSTRUCTIONS  Drink plenty of water. Water helps thin the mucus so your sinuses can drain more easily. Use a humidifier. Inhale steam 3 to 4 times a day (for example, sit in the bathroom with the shower running). Apply a warm, moist washcloth to your face 3 to 4 times a day, or as directed by your caregiver. Use saline nasal sprays to help moisten and clean your sinuses. Take over-the-counter or prescription medicines for pain, discomfort, or fever only as directed by your caregiver. SEEK IMMEDIATE MEDICAL CARE IF: You  have increasing pain or severe headaches. You have nausea, vomiting, or drowsiness. You have swelling around your face. You have vision problems. You have a stiff neck. You have difficulty breathing. MAKE SURE YOU:  Understand these instructions. Will watch your condition. Will get help right away if you are not doing well or get worse. Document Released: 09/26/2005 Document Revised: 12/19/2011 Document Reviewed: 10/11/2011 [facility] Patient Information 2014 [place], [place].    If you have been instructed to have an in-person evaluation today at a local Urgent Care facility, please use the link below. It will take you to a list of all of our available [facility], including address, phone number and hours of operation. Please do not delay care.  [facility]  If you or a family member do not have a primary care provider, use the link below to schedule a visit and establish care. When you choose a [facility] primary care physician or advanced practice provider, you gain a long-term partner in health. Find a Primary Care Provider  Learn more about [facility]'s in-office and virtual care options: [facility]

## 2022-04-06 ENCOUNTER — Ambulatory Visit: Payer: Self-pay

## 2022-04-06 NOTE — Telephone Encounter (Signed)
  Chief Complaint: Headache Symptoms: pt feels off and funny, HA constant Frequency: unsure how long Pertinent Negatives: Patient denies numbness, tingling, weakness Disposition: [x] ED /[] Urgent Care (no appt availability in office) / [] Appointment(In office/virtual)/ []  [facility]/ [] Home Care/ [] Refused Recommended Disposition /[] [facility]/ []  Follow-up with PCP Additional Notes: pt said she is unsure if BP elevated or what but just feels different and confused at times. She doesn't have a way to monitor BP. Pt said she also has HA that is constant. Advised her to go to ED d/t symptoms. Pt said she would go to [facility].   Reason for Disposition  Headache or vomiting  Answer Assessment - Initial Assessment Questions 1. LEVEL OF CONSCIOUSNESS: "How is he (she, the patient) acting right now?" (e.g., alert-oriented, confused, lethargic, stuporous, comatose)     Just feeling off and confused  2. ONSET: "When did the confusion start?"  (minutes, hours, days)     unsure 3. PATTERN "Does this come and go, or has it been constant since it started?"  "Is it present now?"     Constant HA 5. NARCOTIC MEDICATIONS: "Has he been receiving any narcotic medications?" (e.g., morphine, Vicodin)     No 7. OTHER SYMPTOMS: "Are there any other symptoms?" (e.g., difficulty breathing, headache, fever, weakness)     headache  Protocols used: Confusion - Delirium-A-AH

## 2022-04-06 NOTE — Telephone Encounter (Signed)
Noted patient going to ed

## 2022-04-11 ENCOUNTER — Other Ambulatory Visit: Payer: Self-pay | Admitting: Family Medicine

## 2022-04-11 DIAGNOSIS — I10 Essential (primary) hypertension: Secondary | ICD-10-CM

## 2022-04-11 DIAGNOSIS — R519 Headache, unspecified: Secondary | ICD-10-CM

## 2022-04-11 NOTE — Telephone Encounter (Signed)
Medication Refill - Medication: metoprolol succinate (TOPROL-XL) 25 MG 24 hr tablet   Has the patient contacted their pharmacy? Yes.   (Agent: If no, request that the patient contact the pharmacy for the refill. If patient does not wish to contact the pharmacy document the reason why and proceed with request.) (Agent: If yes, when and what did the pharmacy advise?) new Rx needed   Preferred Pharmacy (with phone number or street name): [facility] [phone] - [place], [place] - [address] [place] [address] Phone:  [phone]  Fax:  [phone]     Has the patient been seen for an appointment in the last year OR does the patient have an upcoming appointment? Yes.    Agent: Please be advised that RX refills may take up to 3 business days. We ask that you follow-up with your pharmacy.

## 2022-04-13 MED ORDER — METOPROLOL SUCCINATE ER 25 MG PO TB24: ORAL_TABLET | ORAL | 0 refills | Status: DC

## 2022-04-13 NOTE — Telephone Encounter (Signed)
Requested medication (s) are due for refill today:   Yes  Requested medication (s) are on the active medication list:   Yes  Future visit scheduled:   Yes in 1 wk   Last ordered: 10/28/2021 #15, 6 refills  (takes 1/2 tablet)  Returned because new rx needed   Requested Prescriptions  Pending Prescriptions Disp Refills   metoprolol succinate (TOPROL-XL) 25 MG 24 hr tablet 15 tablet 6    Sig: TAKE 1/2 TABLET(12.5 MG) BY MOUTH DAILY.     Cardiovascular:  Beta Blockers Passed - 04/11/2022  2:04 PM      Passed - Last BP in normal range    BP Readings from Last 1 Encounters:  01/05/22 122/79         Passed - Last Heart Rate in normal range    Pulse Readings from Last 1 Encounters:  01/05/22 76         Passed - Valid encounter within last 6 months    Recent Outpatient Visits           3 months ago Dysuria   [facility] Hoy Register, MD   5 months ago Primary osteoarthritis of right knee   [facility] [place], [place], [place]   2 years ago Mild depression [facility])   [facility] Hoy Register, MD   2 years ago Trichimoniasis   [facility] [place], [place], [place]   2 years ago Vaginal discharge   [facility] Hoy Register, MD       Future Appointments             In 1 week Hoy Register, MD [facility]

## 2022-04-20 ENCOUNTER — Encounter: Payer: Self-pay | Admitting: Family Medicine

## 2022-04-20 ENCOUNTER — Other Ambulatory Visit: Payer: Self-pay

## 2022-04-20 ENCOUNTER — Ambulatory Visit: Payer: Medicaid Other | Attending: Family Medicine | Admitting: Family Medicine

## 2022-04-20 VITALS — BP 121/81 | HR 77 | Temp 98.2°F | Ht 65.0 in | Wt 231.0 lb

## 2022-04-20 DIAGNOSIS — J328 Other chronic sinusitis: Secondary | ICD-10-CM | POA: Diagnosis not present

## 2022-04-20 DIAGNOSIS — Z9189 Other specified personal risk factors, not elsewhere classified: Secondary | ICD-10-CM | POA: Diagnosis not present

## 2022-04-20 DIAGNOSIS — J4530 Mild persistent asthma, uncomplicated: Secondary | ICD-10-CM | POA: Diagnosis not present

## 2022-04-20 DIAGNOSIS — E559 Vitamin D deficiency, unspecified: Secondary | ICD-10-CM | POA: Diagnosis not present

## 2022-04-20 DIAGNOSIS — I10 Essential (primary) hypertension: Secondary | ICD-10-CM

## 2022-04-20 DIAGNOSIS — R29818 Other symptoms and signs involving the nervous system: Secondary | ICD-10-CM

## 2022-04-20 DIAGNOSIS — E668 Other obesity: Secondary | ICD-10-CM

## 2022-04-20 MED ORDER — CETIRIZINE HCL 10 MG PO TABS
10.0000 mg | ORAL_TABLET | Freq: Every day | ORAL | 2 refills | Status: DC
  Filled 2022-04-20: qty 30, 30d supply, fill #0

## 2022-04-20 MED ORDER — ATORVASTATIN CALCIUM 20 MG PO TABS
20.0000 mg | ORAL_TABLET | Freq: Every day | ORAL | 6 refills | Status: DC
  Filled 2022-04-20: qty 30, 30d supply, fill #0

## 2022-04-20 MED ORDER — METOPROLOL SUCCINATE ER 25 MG PO TB24
ORAL_TABLET | ORAL | 6 refills | Status: DC
  Filled 2022-04-20 (×2): qty 15, 30d supply, fill #0

## 2022-04-20 MED ORDER — BUDESONIDE-FORMOTEROL FUMARATE 160-4.5 MCG/ACT IN AERO
2.0000 | INHALATION_SPRAY | Freq: Two times a day (BID) | RESPIRATORY_TRACT | 6 refills | Status: DC
  Filled 2022-04-20: qty 10.2, 30d supply, fill #0

## 2022-04-20 MED ORDER — IPRATROPIUM BROMIDE 0.03 % NA SOLN
2.0000 | Freq: Two times a day (BID) | NASAL | 0 refills | Status: DC
  Filled 2022-04-20: qty 30, 30d supply, fill #0

## 2022-04-20 NOTE — Progress Notes (Signed)
Subjective:  Patient ID: [name], female    DOB: 30-Aug-1966  Age: 56 y.o. MRN: [id number]  CC: Obesity   HPI Noreen Mackintosh is a 56 y.o. year old female with a history of Hypertension, Asthma, GERD, Depression and low back pain   Interval History: She has a couple of complaints. Complains about insomnia and fatigue. Insomnia has been present for a while and she has a hard time falling asleep. She catches herself snoring and waking up being unable to breathe. She has no daytime somnolence but states she has headaches Her thyroid panel, CBC were normal earlier in the year but in 2020 she did have vitamin D deficiency with a level of 15.5  Also concerned about her weight as despite walking, cutting out sweets she has been unable to lose weight.  She has been out of her antihypertensive and is requesting a refill. Endorses adherence with her statin. Her asthma has been controlled with no flares. Past Medical History:  Diagnosis Date   Asthma    Gastric reflux    History of blood transfusion yrs agio, none recent   Hypertension    Osteoarthritis    Sleep apnea    cpap machine has not used since 2014 due to machine/tubing in bad shape    Syncope and collapse yrs ago    Past Surgical History:  Procedure Laterality Date   CARDIAC CATHETERIZATION     more than 5 years ago   CARDIAC CATHETERIZATION  06-07-2012   [facility]: Normal coronary arteries. No significant LVOT/aortic valve gradient   CHOLECYSTECTOMY     COLONOSCOPY WITH PROPOFOL N/A 02/27/2014   Procedure: COLONOSCOPY WITH PROPOFOL;  Surgeon: Garlan Fair, MD;  Location: [facility];  Service: Endoscopy;  Laterality: N/A;   ESOPHAGOGASTRODUODENOSCOPY (EGD) WITH PROPOFOL N/A 02/27/2014   Procedure: ESOPHAGOGASTRODUODENOSCOPY (EGD) WITH PROPOFOL;  Surgeon: Garlan Fair, MD;  Location: [facility];  Service: Endoscopy;  Laterality: N/A;   EXPLORATORY LAPAROTOMY  09/2001   ELAP, LOA, R partial oophorectomy, repair of  bowel injury   VAGINAL HYSTERECTOMY  2001   Adnexa left in place    History reviewed. No pertinent family history.  Social History   Socioeconomic History   Marital status: Single    Spouse name: Not on file   Number of children: Not on file   Years of education: Not on file   Highest education level: Not on file  Occupational History   Not on file  Tobacco Use   Smoking status: Never   Smokeless tobacco: Never  Vaping Use   Vaping Use: Never used  Substance and Sexual Activity   Alcohol use: No   Drug use: No   Sexual activity: Never    Birth control/protection: Surgical  Other Topics Concern   Not on file  Social History Narrative   Not on file   Social Determinants of Health   Financial Resource Strain: Not on file  Food Insecurity: Not on file  Transportation Needs: Not on file  Physical Activity: Inactive (01/02/2022)   Exercise Vital Sign    Days of Exercise per Week: 0 days    Minutes of Exercise per Session: 0 min  Stress: Not on file  Social Connections: Not on file    Allergies  Allergen Reactions   Tramadol Hives, Itching and Swelling    Pt states after medication intake she had to go to the ER.     Outpatient Medications Prior to Visit  Medication Sig Dispense Refill  acetaminophen-codeine (TYLENOL #3) 300-30 MG tablet Take 1 tablet by mouth every 4 (four) hours as needed for moderate pain. 30 tablet 0   albuterol (VENTOLIN HFA) 108 (90 Base) MCG/ACT inhaler Inhale 2 puffs into the lungs every 6 (six) hours as needed for wheezing or shortness of breath. 18 g 3   benzonatate (TESSALON) 100 MG capsule Take 1 capsule (100 mg total) by mouth 3 (three) times daily as needed for cough. 30 capsule 0   Blood Pressure KIT Use as instructed Dx: Hypertension 1 kit 0   diclofenac Sodium (VOLTAREN) 1 % GEL Apply 2 g topically 4 (four) times daily. 100 g 3   doxycycline (VIBRA-TABS) 100 MG tablet Take 1 tablet (100 mg total) by mouth 2 (two) times daily. 20  tablet 0   naproxen (NAPROSYN) 500 MG tablet Take 1 tablet (500 mg total) by mouth 2 (two) times daily with a meal. 60 tablet 3   atorvastatin (LIPITOR) 20 MG tablet Take 1 tablet (20 mg total) by mouth daily. 30 tablet 3   budesonide-formoterol (SYMBICORT) 160-4.5 MCG/ACT inhaler Inhale 2 puffs into the lungs 2 (two) times daily. 1 each 6   cetirizine (ZYRTEC) 10 MG tablet Take 1 tablet (10 mg total) by mouth daily. 30 tablet 2   ipratropium (ATROVENT) 0.03 % nasal spray Place 2 sprays into both nostrils every 12 (twelve) hours. 30 mL 0   metoprolol succinate (TOPROL-XL) 25 MG 24 hr tablet TAKE 1/2 TABLET(12.5 MG) BY MOUTH DAILY. 15 tablet 0   No facility-administered medications prior to visit.     ROS Review of Systems  Constitutional:  Positive for fatigue. Negative for activity change and appetite change.  HENT:  Negative for sinus pressure and sore throat.   Respiratory:  Negative for chest tightness, shortness of breath and wheezing.   Cardiovascular:  Negative for chest pain and palpitations.  Gastrointestinal:  Negative for abdominal distention, abdominal pain and constipation.  Genitourinary: Negative.   Musculoskeletal: Negative.   Psychiatric/Behavioral:  Positive for sleep disturbance. Negative for behavioral problems and dysphoric mood.     Objective:  BP 121/81   Pulse 77   Temp 98.2 F (36.8 C) (Oral)   Ht _0  (1.651 m)   Wt 231 lb (104.8 kg)   SpO2 97%   BMI 38.44 kg/m      04/20/2022    8:41 AM 02/23/2022    8:33 AM 01/05/2022    1:39 PM  BP/Weight  Systolic BP 947  096  Diastolic BP 81  79  Wt. (Lbs) 231 232 239  BMI 38.44 kg/m2 38.61 kg/m2 39.77 kg/m2      Physical Exam Constitutional:      Appearance: She is well-developed. She is obese.  Cardiovascular:     Rate and Rhythm: Normal rate.     Heart sounds: Murmur heard.  Pulmonary:     Effort: Pulmonary effort is normal.     Breath sounds: Normal breath sounds. No wheezing or rales.  Chest:      Chest wall: No tenderness.  Abdominal:     General: Bowel sounds are normal. There is no distension.     Palpations: Abdomen is soft. There is no mass.     Tenderness: There is no abdominal tenderness.  Musculoskeletal:        General: Normal range of motion.     Right lower leg: No edema.     Left lower leg: No edema.  Neurological:     Mental Status: She  is alert and oriented to person, place, and time.  Psychiatric:        Mood and Affect: Mood normal.        Latest Ref Rng & Units 10/28/2021    8:56 AM 07/15/2019   11:41 AM 12/13/2018   11:11 AM  CMP  Glucose 70 - 99 mg/dL 83  80  79   BUN 6 - 24 mg/dL _0 Creatinine 0.57 - 1.00 mg/dL 0.57  0.54  0.53   Sodium 134 - 144 mmol/L 144  143  141   Potassium 3.5 - 5.2 mmol/L 3.7  3.7  3.4   Chloride 96 - 106 mmol/L 104  108  101   CO2 20 - 29 mmol/L _1 Calcium 8.7 - 10.2 mg/dL 9.4  9.2  9.7   Total Protein 6.0 - 8.5 g/dL 6.7  6.7  6.4   Total Bilirubin 0.0 - 1.2 mg/dL 0.4  0.5  0.4   Alkaline Phos 44 - 121 IU/L 134  119  116   AST 0 - 40 IU/L _2 ALT 0 - 32 IU/L 31  26  33     Lipid Panel     Component Value Date/Time   CHOL 164 10/28/2021 0856   CHOL 167 03/18/2012 0522   TRIG 88 10/28/2021 0856   TRIG 58 03/18/2012 0522   HDL 45 10/28/2021 0856   HDL 60 03/18/2012 0522   CHOLHDL 3.6 10/28/2021 0856   CHOLHDL 3.4 05/04/2016 1004   VLDL 18 05/04/2016 1004   VLDL 12 03/18/2012 0522   LDLCALC 103 (H) 10/28/2021 0856   LDLCALC 95 03/18/2012 0522    CBC    Component Value Date/Time   WBC 5.5 10/28/2021 0856   WBC 6.4 11/14/2016 1400   RBC 4.69 10/28/2021 0856   RBC 4.64 11/14/2016 1400   HGB 12.8 10/28/2021 0856   HCT 38.4 10/28/2021 0856   PLT 297 10/28/2021 0856   MCV 82 10/28/2021 0856   MCV 81 05/19/2013 2105   MCH 27.3 10/28/2021 0856   MCH 27.2 11/14/2016 1400   MCHC 33.3 10/28/2021 0856   MCHC 32.6 11/14/2016 1400   RDW 13.1 10/28/2021 0856   RDW 13.8 05/19/2013 2105    LYMPHSABS 2.0 10/28/2021 0856   LYMPHSABS 1.4 03/17/2012 0549   MONOABS 448 11/14/2016 1400   MONOABS 0.3 03/17/2012 0549   EOSABS 0.1 10/28/2021 0856   EOSABS 0.1 03/17/2012 0549   BASOSABS 0.0 10/28/2021 0856   BASOSABS 0.0 03/17/2012 0549    Lab Results  Component Value Date   HGBA1C 5.9 (H) 10/28/2021    Lab Results  Component Value Date   TSH 1.890 10/28/2021    The 10-year ASCVD risk score (Arnett DK, et al., 2019) is: 10.7%   Values used to calculate the score:     Age: 17 years     Sex: Female     Is Non-Hispanic African American: Yes     Diabetic: Yes     Tobacco smoker: No     Systolic Blood Pressure: 546 mmHg     Is BP treated: Yes     HDL Cholesterol: 45 mg/dL     Total Cholesterol: 164 mg/dL  Assessment & Plan:  1. Vitamin D deficiency Previous history of vitamin D deficiency in 2020 This could also be contributing to fatigue We will check level again - VITAMIN D 25 Hydroxy (  Vit-D Deficiency, Fractures)  2. At increased risk for cardiovascular disease - atorvastatin (LIPITOR) 20 MG tablet; Take 1 tablet (20 mg total) by mouth daily.  Dispense: 30 tablet; Refill: 6  3. Mild persistent asthma without complication Controlled - budesonide-formoterol (SYMBICORT) 160-4.5 MCG/ACT inhaler; Inhale 2 puffs into the lungs 2 (two) times daily.  Dispense: 10.2 g; Refill: 6  4. Other chronic sinusitis Stable - cetirizine (ZYRTEC) 10 MG tablet; Take 1 tablet (10 mg total) by mouth daily.  Dispense: 30 tablet; Refill: 2 - ipratropium (ATROVENT) 0.03 % nasal spray; Place 2 sprays into both nostrils every 12 (twelve) hours.  Dispense: 30 mL; Refill: 0  5. Essential hypertension Controlled Counseled on blood pressure goal of less than 130/80, low-sodium, DASH diet, medication compliance, 150 minutes of moderate intensity exercise per week. Discussed medication compliance, adverse effects. - metoprolol succinate (TOPROL-XL) 25 MG 24 hr tablet; TAKE 1/2 TABLET(12.5  MG) BY MOUTH DAILY.  Dispense: 15 tablet; Refill: 6 - Basic Metabolic Panel  6. Moderate obesity Counseled on decreasing caloric intake, increasing physical activity - Amb Ref to Medical Weight Management  7. Suspected sleep apnea Could explain insomnia and fatigue - Split night study; Future - Home sleep test    Meds ordered this encounter  Medications   atorvastatin (LIPITOR) 20 MG tablet    Sig: Take 1 tablet (20 mg total) by mouth daily.    Dispense:  30 tablet    Refill:  6   budesonide-formoterol (SYMBICORT) 160-4.5 MCG/ACT inhaler    Sig: Inhale 2 puffs into the lungs 2 (two) times daily.    Dispense:  1 each    Refill:  6   cetirizine (ZYRTEC) 10 MG tablet    Sig: Take 1 tablet (10 mg total) by mouth daily.    Dispense:  30 tablet    Refill:  2   ipratropium (ATROVENT) 0.03 % nasal spray    Sig: Place 2 sprays into both nostrils every 12 (twelve) hours.    Dispense:  30 mL    Refill:  0   metoprolol succinate (TOPROL-XL) 25 MG 24 hr tablet    Sig: TAKE 1/2 TABLET(12.5 MG) BY MOUTH DAILY.    Dispense:  15 tablet    Refill:  6    Follow-up: Return in about 6 months (around 10/21/2022) for Chronic medical conditions.       Charlott Rakes, MD, FAAFP. [facility] [place], [place]   04/20/2022, 9:03 AM

## 2022-04-20 NOTE — Patient Instructions (Signed)

## 2022-04-20 NOTE — Progress Notes (Signed)
Concerned about weight No energy Not sleeping good at night. Medication refills.

## 2022-04-21 ENCOUNTER — Other Ambulatory Visit: Payer: Self-pay

## 2022-04-21 ENCOUNTER — Other Ambulatory Visit: Payer: Self-pay | Admitting: Family Medicine

## 2022-04-21 LAB — BASIC METABOLIC PANEL
BUN/Creatinine Ratio: 20 (ref 9–23)
BUN: 11 mg/dL (ref 6–24)
CO2: 24 mmol/L (ref 20–29)
Calcium: 9.6 mg/dL (ref 8.7–10.2)
Chloride: 109 mmol/L — ABNORMAL HIGH (ref 96–106)
Creatinine, Ser: 0.56 mg/dL — ABNORMAL LOW (ref 0.57–1.00)
Glucose: 102 mg/dL — ABNORMAL HIGH (ref 70–99)
Potassium: 3.2 mmol/L — ABNORMAL LOW (ref 3.5–5.2)
Sodium: 147 mmol/L — ABNORMAL HIGH (ref 134–144)
eGFR: 106 mL/min/{1.73_m2} (ref >59)

## 2022-04-21 LAB — VITAMIN D 25 HYDROXY (VIT D DEFICIENCY, FRACTURES): Vit D, 25-Hydroxy: 12.8 ng/mL — ABNORMAL LOW (ref 30.0–100.0)

## 2022-04-21 MED ORDER — POTASSIUM CHLORIDE ER 10 MEQ PO TBCR
10.0000 meq | EXTENDED_RELEASE_TABLET | Freq: Two times a day (BID) | ORAL | 3 refills | Status: DC
  Filled 2022-04-21: qty 30, 15d supply, fill #0

## 2022-04-21 MED ORDER — ERGOCALCIFEROL 1.25 MG (50000 UT) PO CAPS
50000.0000 [IU] | ORAL_CAPSULE | ORAL | 1 refills | Status: DC
  Filled 2022-04-21: qty 12, 84d supply, fill #0

## 2022-04-27 ENCOUNTER — Other Ambulatory Visit: Payer: Self-pay

## 2022-04-27 ENCOUNTER — Ambulatory Visit: Payer: Medicaid Other | Admitting: Family Medicine

## 2022-04-29 ENCOUNTER — Telehealth: Payer: Self-pay

## 2022-04-29 NOTE — Telephone Encounter (Signed)
Called and left a VM advising patient that [facility] no longer takes medicaid for gel injections and to call back with any questions.

## 2022-04-30 ENCOUNTER — Encounter: Payer: Self-pay | Admitting: *Deleted

## 2022-04-30 NOTE — Congregational Nurse Program (Signed)
  Dept: [phone]   Congregational Nurse Program Note  Date of Encounter: 04/30/2022  Past Medical History: Past Medical History:  Diagnosis Date   Asthma    Gastric reflux    History of blood transfusion yrs agio, none recent   Hypertension    Osteoarthritis    Sleep apnea    cpap machine has not used since 2014 due to machine/tubing in bad shape    Syncope and collapse yrs ago    Encounter Details:  CNP Questionnaire - 04/30/22 1052       Questionnaire   Do you give verbal consent to treat you today? Yes    Visit Setting Phone/Text/Email            Spoke to pt regarding her concern about a low Vit D and not having any energy. Reviewed all of her medications and she confirmed she's taking all of her medication just not as prescribed. Provided education on the importance of medication compliance. Education provided on managing hypertension and the importance of medication compliance. We will schedule an inperson meeting tomorrow to review her medication and set up a plan to become more compliant. She will also bring her pill box. She is will contact the pharmacy about her K+ rx and blood pressure machine. Consuello Masse

## 2022-05-02 ENCOUNTER — Telehealth: Payer: Self-pay | Admitting: Emergency Medicine

## 2022-05-02 NOTE — Telephone Encounter (Signed)
Copied from CRM [id number]. Topic: General - Other >> May 02, 2022  9:21 AM Everette C wrote: Reason for CRM: The patient has called to follow up on a previously discussed prescription for Vitamin D and Potasium  The patient was under the impression that based on their labs from 04/20/22 they would be prescribed vitamins   Please contact the patient further when possible

## 2022-05-05 ENCOUNTER — Other Ambulatory Visit: Payer: Self-pay

## 2022-05-05 ENCOUNTER — Other Ambulatory Visit: Payer: Self-pay | Admitting: Family Medicine

## 2022-05-05 MED ORDER — POTASSIUM CHLORIDE ER 10 MEQ PO TBCR: 10.0000 meq | EXTENDED_RELEASE_TABLET | Freq: Two times a day (BID) | ORAL | 3 refills | Status: DC

## 2022-05-05 MED ORDER — ERGOCALCIFEROL 1.25 MG (50000 UT) PO CAPS: 50000.0000 [IU] | ORAL_CAPSULE | ORAL | 1 refills | Status: DC

## 2022-05-05 NOTE — Addendum Note (Signed)
Addended by: Wilford Corner on: 05/05/2022 02:38 PM   Modules accepted: Orders

## 2022-05-05 NOTE — Addendum Note (Signed)
Addended by: Lois Huxley, Jeannett Senior L on: 05/05/2022 05:55 PM   Modules accepted: Orders

## 2022-05-05 NOTE — Telephone Encounter (Signed)
Patient called in medications that were ordered on 07/13, she hasnt' received at pharmacy yet, ergocalciferol (DRISDOL) 1.25 MG (50000 UT) capsule and  potassium chloride (KLOR-CON) 10 MEQ tablet Please call back with status. It was supposed t be sent to [address], [place], [place] [address], [phone]

## 2022-05-05 NOTE — Telephone Encounter (Signed)
Pt was called and informed that medication has been sent to her pharmacy.

## 2022-05-05 NOTE — Telephone Encounter (Signed)
[facility] at [facility] called, left VM to return the call to the office. Will need to know if the Rx was received on 04/21/22 for the potassium and drisdol that patient has requested.

## 2022-05-05 NOTE — Telephone Encounter (Signed)
Requested medication (s) are due for refill today: Yes  Requested medication (s) are on the active medication list: Yes  Last refill:  04/21/22 to wrong pharmacy  Future visit scheduled: No  Notes to clinic:  Unable to refill per protocol, cannot delegate. Send to another pharmacy     Requested Prescriptions  Pending Prescriptions Disp Refills   ergocalciferol (DRISDOL) 1.25 MG (50000 UT) capsule 12 capsule 1    Sig: Take 1 capsule (50,000 Units total) by mouth once a week.     Endocrinology:  Vitamins - Vitamin D Supplementation 2 Failed - 05/05/2022  2:42 PM      Failed - Manual Review: Route requests for 50,000 IU strength to the provider      Failed - Vitamin D in normal range and within 360 days    Vit D, 25-Hydroxy  Date Value Ref Range Status  04/20/2022 12.8 (L) 30.0 - 100.0 ng/mL Final    Comment:    Vitamin D deficiency has been defined by the Institute of Medicine and an Endocrine Society practice guideline as a level of serum 25-OH vitamin D less than 20 ng/mL (1,2). The Endocrine Society went on to further define vitamin D insufficiency as a level between 21 and 29 ng/mL (2). 1. IOM (Institute of Medicine). 2010. Dietary reference    intakes for calcium and D. [place]: The    [employer]. 2. Holick MF, Binkley Beadle, Bischoff-Ferrari HA, et al.    Evaluation, treatment, and prevention of vitamin D    deficiency: an Endocrine Society clinical practice    guideline. JCEM. 2011 Jul; 96(7):1911-30.          Passed - Ca in normal range and within 360 days    Calcium  Date Value Ref Range Status  04/20/2022 9.6 8.7 - 10.2 mg/dL Final   Calcium, Total  Date Value Ref Range Status  05/19/2013 9.2 8.5 - 10.1 mg/dL Final   Calcium, Ion  Date Value Ref Range Status  06/29/2017 1.20 1.15 - 1.40 mmol/L Final         Passed - Valid encounter within last 12 months    Recent Outpatient Visits           2 weeks ago Vitamin D deficiency   [facility] Hoy Register, MD   4 months ago Dysuria   [facility] Hoy Register, MD   6 months ago Primary osteoarthritis of right knee   [facility] [place], [place], [place]   2 years ago Mild depression [facility])   [facility] [place], Odette Horns, MD   2 years ago Trichimoniasis   [facility] [place], [place], [place]              Signed Prescriptions Disp Refills   potassium chloride (KLOR-CON) 10 MEQ tablet 30 tablet 3    Sig: Take 1 tablet (10 mEq total) by mouth 2 (two) times daily.     Endocrinology:  Minerals - Potassium Supplementation Failed - 05/05/2022  2:42 PM      Failed - K in normal range and within 360 days    Potassium  Date Value Ref Range Status  04/20/2022 3.2 (L) 3.5 - 5.2 mmol/L Final  05/19/2013 3.5 3.5 - 5.1 mmol/L Final         Failed - Cr in normal range and within 360 days  Creat  Date Value Ref Range Status  11/14/2016 0.54 0.50 - 1.05 mg/dL Final    Comment:      For patients > or = 58 years of age: The upper reference limit for Creatinine is approximately 13% higher for people identified as African-American.      Creatinine, Ser  Date Value Ref Range Status  04/20/2022 0.56 (L) 0.57 - 1.00 mg/dL Final         Passed - Valid encounter within last 12 months    Recent Outpatient Visits           2 weeks ago Vitamin D deficiency   [facility] Hoy Register, MD   4 months ago Dysuria   [facility] Hoy Register, MD   6 months ago Primary osteoarthritis of right knee   [facility] [place], [place], [place]   2 years ago Mild depression [facility])   [facility] Hoy Register, MD   2 years ago Trichimoniasis   [facility] [place], [place], [place]

## 2022-05-05 NOTE — Telephone Encounter (Signed)
[facility] at [facility] called and spoke to Lambert, Pensions consultant about the refill(s). He says she did not pick them up there.

## 2022-05-13 ENCOUNTER — Encounter: Payer: Self-pay | Admitting: Family Medicine

## 2022-05-13 NOTE — Telephone Encounter (Signed)
Erroneous encounter

## 2022-06-06 ENCOUNTER — Ambulatory Visit: Payer: Self-pay

## 2022-06-06 ENCOUNTER — Ambulatory Visit
Admission: EM | Admit: 2022-06-06 | Discharge: 2022-06-06 | Disposition: A | Discharge status: Home / Self Care | Payer: Medicaid Other | Attending: Physician Assistant | Admitting: Physician Assistant

## 2022-06-06 DIAGNOSIS — J069 Acute upper respiratory infection, unspecified: Secondary | ICD-10-CM

## 2022-06-06 DIAGNOSIS — U071 COVID-19: Secondary | ICD-10-CM | POA: Insufficient documentation

## 2022-06-06 NOTE — ED Triage Notes (Signed)
Pt c/o cough, nasal congestion, bilateral otalgia. Onset ~ Saturday.

## 2022-06-06 NOTE — ED Provider Notes (Signed)
[name]    CSN: [id number] Arrival date & time: 06/06/22  1044      History   Chief Complaint Chief Complaint  Patient presents with   Cough    HPI [name] is a 56 y.o. female.   Patient here today for evaluation of cough, congestion, bilateral ear pain that started 2 days ago. She has not had fever. She denies any nausea, vomiting or diarrhea.   Her friend [name] is on the phone at one point and requests refill of her metoprolol. Per patient and friend she has not had metoprolol the majority of this month.   The history is provided by the patient.  Cough Associated symptoms: ear pain   Associated symptoms: no chills, no eye discharge, no fever and no shortness of breath     Past Medical History:  Diagnosis Date   Asthma    Gastric reflux    History of blood transfusion yrs agio, none recent   Hypertension    Osteoarthritis    Sleep apnea    cpap machine has not used since 2014 due to machine/tubing in bad shape    Syncope and collapse yrs ago    Patient Active Problem List   Diagnosis Date Noted   Primary osteoarthritis of right knee 07/01/2021   Primary osteoarthritis of left knee 07/01/2021   Developmental delay 09/17/2020   Pre-diabetes 07/07/2020   Dyspnea on exertion 07/07/2020   Abnormal ECG 07/07/2020   Obesity 01/29/2018   Acute left-sided low back pain with left-sided sciatica 11/27/2017   Low back pain 10/30/2017   Itching with irritation 06/23/2016   Gastric reflux 05/04/2016   Asthma 05/04/2016   Essential hypertension 07/23/2015   Mild depression 07/23/2015   Chest pain 06/06/2012   Murmur, cardiac 06/06/2012    Past Surgical History:  Procedure Laterality Date   CARDIAC CATHETERIZATION     more than 5 years ago   CARDIAC CATHETERIZATION  06-07-2012   [facility]: Normal coronary arteries. No significant LVOT/aortic valve gradient   CHOLECYSTECTOMY     COLONOSCOPY WITH PROPOFOL N/A 02/27/2014   Procedure: COLONOSCOPY  WITH PROPOFOL;  Surgeon: Garlan Fair, MD;  Location: [facility];  Service: Endoscopy;  Laterality: N/A;   ESOPHAGOGASTRODUODENOSCOPY (EGD) WITH PROPOFOL N/A 02/27/2014   Procedure: ESOPHAGOGASTRODUODENOSCOPY (EGD) WITH PROPOFOL;  Surgeon: Garlan Fair, MD;  Location: [facility];  Service: Endoscopy;  Laterality: N/A;   EXPLORATORY LAPAROTOMY  09/2001   ELAP, LOA, R partial oophorectomy, repair of bowel injury   VAGINAL HYSTERECTOMY  2001   Adnexa left in place    OB History     Gravida  2   Para  2   Term  2   Preterm      AB      Living  2      SAB      IAB      Ectopic      Multiple      Live Births               Home Medications    Prior to Admission medications   Medication Sig Start Date End Date Taking? Authorizing Provider  acetaminophen-codeine (TYLENOL #3) 300-30 MG tablet Take 1 tablet by mouth every 4 (four) hours as needed for moderate pain. 03/24/21   Pete Pelt, PA-C  albuterol (VENTOLIN HFA) 108 (90 Base) MCG/ACT inhaler Inhale 2 puffs into the lungs every 6 (six) hours as needed for wheezing or shortness of  breath. 10/28/21   Argentina Donovan, PA-C  atorvastatin (LIPITOR) 20 MG tablet Take 1 tablet (20 mg total) by mouth daily. 04/20/22   Charlott Rakes, MD  benzonatate (TESSALON) 100 MG capsule Take 1 capsule (100 mg total) by mouth 3 (three) times daily as needed for cough. 03/16/22   Brunetta Jeans, PA-C  Blood Pressure KIT Use as instructed Dx: Hypertension 07/15/19   Charlott Rakes, MD  budesonide-formoterol (SYMBICORT) 160-4.5 MCG/ACT inhaler Inhale 2 puffs into the lungs 2 (two) times daily. 04/20/22   Charlott Rakes, MD  cetirizine (ZYRTEC) 10 MG tablet Take 1 tablet (10 mg total) by mouth daily. 04/20/22   Charlott Rakes, MD  diclofenac Sodium (VOLTAREN) 1 % GEL Apply 2 g topically 4 (four) times daily. 10/28/21   Argentina Donovan, PA-C  doxycycline (VIBRA-TABS) 100 MG tablet Take 1 tablet (100 mg total) by mouth 2 (two)  times daily. Patient not taking: Reported on 04/30/2022 03/16/22   Brunetta Jeans, PA-C  ergocalciferol (DRISDOL) 1.25 MG (50000 UT) capsule Take 1 capsule (50,000 Units total) by mouth once a week. 05/05/22   Charlott Rakes, MD  ipratropium (ATROVENT) 0.03 % nasal spray Place 2 sprays into both nostrils every 12 (twelve) hours. 04/20/22   Charlott Rakes, MD  metoprolol succinate (TOPROL-XL) 25 MG 24 hr tablet TAKE 1/2 TABLET(12.5 MG) BY MOUTH DAILY. 04/20/22   Charlott Rakes, MD  naproxen (NAPROSYN) 500 MG tablet Take 1 tablet (500 mg total) by mouth 2 (two) times daily with a meal. 01/05/22   Charlott Rakes, MD  potassium chloride (KLOR-CON) 10 MEQ tablet Take 1 tablet (10 mEq total) by mouth 2 (two) times daily. 05/05/22   Charlott Rakes, MD  fluticasone (FLONASE) 50 MCG/ACT nasal spray Place 2 sprays into both nostrils daily. 07/15/19 04/01/20  Charlott Rakes, MD  potassium chloride (K-DUR,KLOR-CON) 10 MEQ tablet Takes 1 tablet every other day.  06/04/12  [provider]    Family History History reviewed. No pertinent family history.  Social History Social History   Tobacco Use   Smoking status: Never   Smokeless tobacco: Never  Vaping Use   Vaping Use: Never used  Substance Use Topics   Alcohol use: No   Drug use: No     Allergies   Tramadol   Review of Systems Review of Systems  Constitutional:  Negative for chills and fever.  HENT:  Positive for congestion and ear pain.   Eyes:  Negative for discharge and redness.  Respiratory:  Positive for cough. Negative for shortness of breath.   Gastrointestinal:  Negative for diarrhea, nausea and vomiting.     Physical Exam Triage Vital Signs ED Triage Vitals  Enc Vitals Group     BP 06/06/22 1206 108/75     Pulse Rate 06/06/22 1206 86     Resp 06/06/22 1206 16     Temp 06/06/22 1206 99.6 F (37.6 C)     Temp Source 06/06/22 1206 Oral     SpO2 06/06/22 1206 96 %     Weight --      Height --      Head  Circumference --      Peak Flow --      Pain Score 06/06/22 1212 10     Pain Loc --      Pain Edu? --      Excl. in [place]? --    No data found.  Updated Vital Signs BP 108/75 (BP Location: Right Arm)   Pulse  86   Temp 99.6 F (37.6 C) (Oral)   Resp 16   SpO2 96%      Physical Exam Vitals and nursing note reviewed.  Constitutional:      General: She is not in acute distress.    Appearance: Normal appearance. She is not ill-appearing.  HENT:     Head: Normocephalic and atraumatic.     Right Ear: Tympanic membrane normal.     Left Ear: Tympanic membrane normal.     Nose: Congestion present.     Mouth/Throat:     Mouth: Mucous membranes are moist.     Pharynx: No oropharyngeal exudate or posterior oropharyngeal erythema.  Eyes:     Conjunctiva/sclera: Conjunctivae normal.  Cardiovascular:     Rate and Rhythm: Normal rate and regular rhythm.     Heart sounds: Normal heart sounds. No murmur heard. Pulmonary:     Effort: Pulmonary effort is normal. No respiratory distress.     Breath sounds: Normal breath sounds. No wheezing, rhonchi or rales.  Skin:    General: Skin is warm and dry.  Neurological:     Mental Status: She is alert.  Psychiatric:        Mood and Affect: Mood normal.        Thought Content: Thought content normal.      UC Treatments / Results  Labs (all labs ordered are listed, but only abnormal results are displayed) Labs Reviewed  SARS CORONAVIRUS 2 (TAT 6-24 HRS)    EKG   Radiology No results found.  Procedures Procedures (including critical care time)  Medications Ordered in UC Medications - No data to display  Initial Impression / Assessment and Plan / UC Course  I have reviewed the triage vital signs and the nursing notes.  Pertinent labs & imaging results that were available during my care of the patient were reviewed by me and considered in my medical decision making (see chart for details).    Suspect viral etiology of symptoms.   COVID screening ordered.  Recommended symptomatic treatment otherwise with Tylenol or ibuprofen if needed, increase fluids and rest.  Will defer refill of metoprolol at this time given lower blood pressure without medication.  Recommended she discuss same with her primary care provider.  Final Clinical Impressions(s) / UC Diagnoses   Final diagnoses:  Acute upper respiratory infection   Discharge Instructions   None    ED Prescriptions   None    PDMP not reviewed this encounter.   Francene Finders, PA-C 06/06/22 1444

## 2022-06-06 NOTE — Telephone Encounter (Signed)
Patient called, left VM to return the call to the office to discuss symptoms with a nurse.  Summary: coughing, feels congested, and has drainage.   Pt stated she has been coughing, feels congested, and has drainage.   No appointments.  Suggested [facility] unsure if pt will go .   Pt seeking clinical advice.

## 2022-06-06 NOTE — Telephone Encounter (Signed)
  Chief Complaint: cough-dry Symptoms: dry constant cough, nasal drainage/congestion Frequency: started Saturday Pertinent Negatives: Patient denies fever Disposition: [] ED /[x] Urgent Care (no appt availability in office) / [] Appointment(In office/virtual)/ []  [facility]/ [] Home Care/ [] Refused Recommended Disposition /[] [place] [facility]/ []  Follow-up with PCP Additional Notes: Patient advised no open appointment- [facility] mobile unit tomorrow or UC- she is going to UC  Reason for Disposition  SEVERE coughing spells (e.g., whooping sound after coughing, vomiting after coughing)  Answer Assessment - Initial Assessment Questions 1. ONSET: "When did the cough begin?"      Started Saturday 2. SEVERITY: "How bad is the cough today?"      Constant coughing 3. SPUTUM: "Describe the color of your sputum" (none, dry cough; clear, white, yellow, green)     no 4. HEMOPTYSIS: "Are you coughing up any blood?" If so ask: "How much?" (flecks, streaks, tablespoons, etc.)     no 5. DIFFICULTY BREATHING: "Are you having difficulty breathing?" If Yes, ask: "How bad is it?" (e.g., mild, moderate, severe)    - MILD: No SOB at rest, mild SOB with walking, speaks normally in sentences, can lie down, no retractions, pulse < 100.    - MODERATE: SOB at rest, SOB with minimal exertion and prefers to sit, cannot lie down flat, speaks in phrases, mild retractions, audible wheezing, pulse 100-120.    - SEVERE: Very SOB at rest, speaks in single words, struggling to breathe, sitting hunched forward, retractions, pulse > 120      No problem 6. FEVER: "Do you have a fever?" If Yes, ask: "What is your temperature, how was it measured, and when did it start?"     no 7. CARDIAC HISTORY: "Do you have any history of heart disease?" (e.g., heart attack, congestive heart failure)      no 8. LUNG HISTORY: "Do you have any history of lung disease?"  (e.g., pulmonary embolus, asthma, emphysema)     asthma 9. PE  RISK FACTORS: "Do you have a history of blood clots?" (or: recent major surgery, recent prolonged travel, bedridden)     no 10. OTHER SYMPTOMS: "Do you have any other symptoms?" (e.g., runny nose, wheezing, chest pain)       Nose clogged- yellow, throat irritated 11. PREGNANCY: "Is there any chance you are pregnant?" "When was your last menstrual period?"       N/a 12. TRAVEL: "Have you traveled out of the country in the last month?" (e.g., travel history, exposures)       Patient came home from church Saturday  Protocols used: Cough - Acute Non-Productive-A-AH

## 2022-06-07 ENCOUNTER — Telehealth: Payer: Self-pay | Admitting: Family Medicine

## 2022-06-07 DIAGNOSIS — U071 COVID-19: Secondary | ICD-10-CM

## 2022-06-07 LAB — SARS CORONAVIRUS 2 (TAT 6-24 HRS): SARS Coronavirus 2: POSITIVE — AB

## 2022-06-07 NOTE — Telephone Encounter (Unsigned)
Copied from CRM [id number]. Topic: General - Other >> Jun 07, 2022  1:41 PM Everette C wrote: Reason for CRM: The patient and their friend Ms Darron Doom (on [place]) would like to be contacted to review the patient's recent COVID test results   Please contact further when possible

## 2022-06-07 NOTE — Telephone Encounter (Signed)
Pt evaluated and treated at [facility] at [facility].

## 2022-06-07 NOTE — Telephone Encounter (Signed)
Pt was informed of results  Pt is covid positive and is asking if any medication can be prescribed, she also states that she needs a letter for work.

## 2022-06-08 ENCOUNTER — Other Ambulatory Visit: Payer: Self-pay

## 2022-06-08 ENCOUNTER — Telehealth: Payer: Self-pay

## 2022-06-08 ENCOUNTER — Other Ambulatory Visit: Payer: Self-pay | Admitting: Pharmacist

## 2022-06-08 ENCOUNTER — Encounter: Payer: Self-pay | Admitting: Family Medicine

## 2022-06-08 ENCOUNTER — Telehealth (HOSPITAL_COMMUNITY): Payer: Self-pay | Admitting: Emergency Medicine

## 2022-06-08 ENCOUNTER — Ambulatory Visit: Payer: Medicaid Other | Attending: Family Medicine | Admitting: Family Medicine

## 2022-06-08 DIAGNOSIS — U071 COVID-19: Secondary | ICD-10-CM

## 2022-06-08 MED ORDER — NIRMATRELVIR/RITONAVIR (PAXLOVID)TABLET: 3.0000 | ORAL_TABLET | Freq: Two times a day (BID) | ORAL | 0 refills | Status: AC

## 2022-06-08 MED ORDER — ONDANSETRON 4 MG PO TBDP
4.0000 mg | ORAL_TABLET | Freq: Four times a day (QID) | ORAL | 0 refills | Status: DC | PRN
  Filled 2022-06-08: qty 15, 4d supply, fill #0

## 2022-06-08 MED ORDER — PROMETHAZINE HCL 25 MG PO TABS: 25.0000 mg | ORAL_TABLET | Freq: Three times a day (TID) | ORAL | 0 refills | Status: DC | PRN

## 2022-06-08 MED ORDER — PROMETHAZINE HCL 25 MG PO TABS
25.0000 mg | ORAL_TABLET | Freq: Three times a day (TID) | ORAL | 0 refills | Status: DC | PRN
  Filled 2022-06-08: qty 20, 7d supply, fill #0

## 2022-06-08 MED ORDER — NIRMATRELVIR/RITONAVIR (PAXLOVID)TABLET
3.0000 | ORAL_TABLET | Freq: Two times a day (BID) | ORAL | 0 refills | Status: DC
  Filled 2022-06-08: qty 30, 5d supply, fill #0

## 2022-06-08 NOTE — Telephone Encounter (Signed)
Pt called clinic asking to speak with RN. RN returned call however was sent to VM.

## 2022-06-08 NOTE — Telephone Encounter (Signed)
Patient COVID+, c/o nausea and lack of appetite.  Reviewed with Dr. Leonides Grills, okay'd to send Zofran

## 2022-06-08 NOTE — Telephone Encounter (Signed)
Please double book her at 11:10a today. Thanks

## 2022-06-08 NOTE — Telephone Encounter (Signed)
Patient would like medication , promethazine (PHENERGAN) 25 MG tablets and nirmatrelvir/ritonavir EUA (PAXLOVID) 20 x 150 MG & 10 x 100MG  TABS ent to a different pharmacy. [address], [place], [place], [phone]

## 2022-06-08 NOTE — Telephone Encounter (Signed)
Rx sent to [facility] on [address].

## 2022-06-08 NOTE — Telephone Encounter (Signed)
See previous note

## 2022-06-08 NOTE — Progress Notes (Signed)
Virtual Visit via Telephone Note  I connected with [name], on 06/08/2022 at 12:36 PM by telephone and verified that I am speaking with the correct person using two identifiers.   Consent: I discussed the limitations, risks, security and privacy concerns of performing an evaluation and management service by telephone and the availability of in person appointments. I also discussed with the patient that there may be a patient responsible charge related to this service. The patient expressed understanding and agreed to proceed.   Location of Patient: Home  Location of Provider: Clinic   Persons participating in Telemedicine visit: [name] Dr. Margarita Rana     History of Present Illness: [name] is a 56 y.o. year old female with a history of Hypertension, Asthma, GERD, Depression and low back pain    Complains of uncontrollable cough, fatigue, nausea for which she had presented to urgent care and was diagnosed with an acute upper respiratory tract infection. She subsequently had a positive COVID test and states she was informed to use Mucinex. She has received 3 series of the Moderna vaccine. She is requesting an antiviral for COVID antinausea medication.  Also requesting a note for work. Review of her chart indicate this note was written for her by urgent care. Past Medical History:  Diagnosis Date   Asthma    Gastric reflux    History of blood transfusion yrs agio, none recent   Hypertension    Osteoarthritis    Sleep apnea    cpap machine has not used since 2014 due to machine/tubing in bad shape    Syncope and collapse yrs ago   Allergies  Allergen Reactions   Tramadol Hives, Itching and Swelling    Pt states after medication intake she had to go to the ER.     Current Outpatient Medications on File Prior to Visit  Medication Sig Dispense Refill   acetaminophen-codeine (TYLENOL #3) 300-30 MG tablet Take 1 tablet by mouth every 4 (four) hours as  needed for moderate pain. 30 tablet 0   albuterol (VENTOLIN HFA) 108 (90 Base) MCG/ACT inhaler Inhale 2 puffs into the lungs every 6 (six) hours as needed for wheezing or shortness of breath. 18 g 3   atorvastatin (LIPITOR) 20 MG tablet Take 1 tablet (20 mg total) by mouth daily. 30 tablet 6   benzonatate (TESSALON) 100 MG capsule Take 1 capsule (100 mg total) by mouth 3 (three) times daily as needed for cough. 30 capsule 0   Blood Pressure KIT Use as instructed Dx: Hypertension 1 kit 0   budesonide-formoterol (SYMBICORT) 160-4.5 MCG/ACT inhaler Inhale 2 puffs into the lungs 2 (two) times daily. 10.2 g 6   cetirizine (ZYRTEC) 10 MG tablet Take 1 tablet (10 mg total) by mouth daily. 30 tablet 2   diclofenac Sodium (VOLTAREN) 1 % GEL Apply 2 g topically 4 (four) times daily. 100 g 3   doxycycline (VIBRA-TABS) 100 MG tablet Take 1 tablet (100 mg total) by mouth 2 (two) times daily. (Patient not taking: Reported on 04/30/2022) 20 tablet 0   ergocalciferol (DRISDOL) 1.25 MG (50000 UT) capsule Take 1 capsule (50,000 Units total) by mouth once a week. 12 capsule 1   ipratropium (ATROVENT) 0.03 % nasal spray Place 2 sprays into both nostrils every 12 (twelve) hours. 30 mL 0   metoprolol succinate (TOPROL-XL) 25 MG 24 hr tablet TAKE 1/2 TABLET(12.5 MG) BY MOUTH DAILY. 15 tablet 6   naproxen (NAPROSYN) 500 MG tablet Take 1 tablet (500  mg total) by mouth 2 (two) times daily with a meal. 60 tablet 3   potassium chloride (KLOR-CON) 10 MEQ tablet Take 1 tablet (10 mEq total) by mouth 2 (two) times daily. 30 tablet 3   [DISCONTINUED] fluticasone (FLONASE) 50 MCG/ACT nasal spray Place 2 sprays into both nostrils daily. 16 g 1   [DISCONTINUED] potassium chloride (K-DUR,KLOR-CON) 10 MEQ tablet Takes 1 tablet every other day.     No current facility-administered medications on file prior to visit.    ROS: See HPI  Observations/Objective: Awake, alert, oriented x3 Not in acute distress Normal mood       Latest Ref Rng & Units 04/20/2022    9:15 AM 10/28/2021    8:56 AM 07/15/2019   11:41 AM  CMP  Glucose 70 - 99 mg/dL 102  83  80   BUN 6 - 24 mg/dL 11  13  9   Creatinine 0.57 - 1.00 mg/dL 0.56  0.57  0.54   Sodium 134 - 144 mmol/L 147  144  143   Potassium 3.5 - 5.2 mmol/L 3.2  3.7  3.7   Chloride 96 - 106 mmol/L 109  104  108   CO2 20 - 29 mmol/L 24  25  25   Calcium 8.7 - 10.2 mg/dL 9.6  9.4  9.2   Total Protein 6.0 - 8.5 g/dL  6.7  6.7   Total Bilirubin 0.0 - 1.2 mg/dL  0.4  0.5   Alkaline Phos 44 - 121 IU/L  134  119   AST 0 - 40 IU/L  22  14   ALT 0 - 32 IU/L  31  26     Lipid Panel     Component Value Date/Time   CHOL 164 10/28/2021 0856   CHOL 167 03/18/2012 0522   TRIG 88 10/28/2021 0856   TRIG 58 03/18/2012 0522   HDL 45 10/28/2021 0856   HDL 60 03/18/2012 0522   CHOLHDL 3.6 10/28/2021 0856   CHOLHDL 3.4 05/04/2016 1004   VLDL 18 05/04/2016 1004   VLDL 12 03/18/2012 0522   LDLCALC 103 (H) 10/28/2021 0856   LDLCALC 95 03/18/2012 0522   LABVLDL 16 10/28/2021 0856    Lab Results  Component Value Date   HGBA1C 5.9 (H) 10/28/2021    Assessment and Plan: 1. COVID-19 virus infection Discussed isolation guidelines She already has a note in her chart written from urgent care - nirmatrelvir/ritonavir EUA (PAXLOVID) 20 x 150 MG & 10 x 100MG TABS; Take 3 tablets by mouth 2 (two) times daily for 5 days. (Take nirmatrelvir 150 mg two tablets twice daily for 5 days and ritonavir 100 mg one tablet twice daily for 5 days) Patient GFR is 106  Dispense: 30 tablet; Refill: 0 - promethazine (PHENERGAN) 25 MG tablet; Take 1 tablet (25 mg total) by mouth every 8 (eight) hours as needed for nausea or vomiting.  Dispense: 20 tablet; Refill: 0   Follow Up Instructions: Keep previously scheduled appointment   I discussed the assessment and treatment plan with the patient. The patient was provided an opportunity to ask questions and all were answered. The patient agreed with the plan  and demonstrated an understanding of the instructions.   The patient was advised to call back or seek an in-person evaluation if the symptoms worsen or if the condition fails to improve as anticipated.     I provided 11 minutes total of non-face-to-face time during this encounter.   Enobong Newlin, MD, FAAFP.   [facility] and [place], [place]   06/08/2022, 12:36 PM

## 2022-06-09 ENCOUNTER — Other Ambulatory Visit: Payer: Self-pay

## 2022-06-09 ENCOUNTER — Ambulatory Visit: Payer: Self-pay

## 2022-06-09 NOTE — Telephone Encounter (Signed)
Patient called, left VM to return the call to the office to speak to a nurse.     Summary: Emesis and congestion?   Patient called in stating she spoke with the provider yesterday on a telephone visit and was prescribed some medicine. She states not only is she congested now but also throwing up some. She wants to know what she should do. She has yet to pick up the medicine she was prescribed yesterday by the provider but says she will try to get someone to get it today as she says I am not well and can't get it myself/

## 2022-06-09 NOTE — Telephone Encounter (Signed)
  Chief Complaint: vomiting Symptoms: vomiting Covid Frequency: this morning Pertinent Negatives: Patient denies na Disposition: [] ED /[] Urgent Care (no appt availability in office) / [] Appointment(In office/virtual)/ []  [place] [facility]/ [] Home Care/ [] Refused Recommended Disposition /[] [place] [facility]/ []  Follow-up with PCP Additional Notes: Pt had Phenergan ordered yesterday but had not been to get it. When I called she had it and had taken it and feeling much better.   Answer Assessment - Initial Assessment Questions 1. REASON FOR CALL or QUESTION: "What is your reason for calling today?" or "How can I best help you?" or "What question do you have that I can help answer?"     Pt had called to say she saw md yesterday but now is vomiting. Phenergan was called in yesterday and pt had not been to get it as of the call.  Protocols used: Information Only Call - No Triage-A-AH

## 2022-06-12 ENCOUNTER — Encounter (HOSPITAL_BASED_OUTPATIENT_CLINIC_OR_DEPARTMENT_OTHER): Payer: Medicaid Other | Admitting: Internal Medicine

## 2022-06-28 ENCOUNTER — Ambulatory Visit (HOSPITAL_BASED_OUTPATIENT_CLINIC_OR_DEPARTMENT_OTHER): Payer: Medicaid Other | Attending: Family Medicine | Admitting: Internal Medicine

## 2022-06-28 VITALS — Ht 64.0 in | Wt 224.0 lb

## 2022-06-28 DIAGNOSIS — R29818 Other symptoms and signs involving the nervous system: Secondary | ICD-10-CM | POA: Insufficient documentation

## 2022-06-28 DIAGNOSIS — G4733 Obstructive sleep apnea (adult) (pediatric): Secondary | ICD-10-CM | POA: Diagnosis not present

## 2022-06-30 ENCOUNTER — Ambulatory Visit: Payer: Self-pay

## 2022-06-30 NOTE — Telephone Encounter (Signed)
NOTED

## 2022-06-30 NOTE — Telephone Encounter (Signed)
     Summary: Knee pain   Pt states she is having a throbbing pain I'm her rt knee   Pt requesting a call back on her lunch break at 2pm   Please assist further

## 2022-06-30 NOTE — Telephone Encounter (Signed)
Chief Complaint: Knee pain Symptoms: 10/10 pain, swelling Frequency: Ongoing x 2 months constant pain, worse with standing, walking, resting Pertinent Negatives: Patient denies other symptoms Disposition: [] ED /[x] Urgent Care (no appt availability in office) / [] Appointment(In office/virtual)/ []  [place] [facility]/ [] Home Care/ [] Refused Recommended Disposition /[] [place] [facility]/ []  Follow-up with PCP Additional Notes: Advised [facility], she says she will go tomorrow. No availability with any provider until 08/03/22.    Reason for Disposition  [1] SEVERE pain (e.g., excruciating, unable to walk) AND [2] not improved after 2 hours of pain medicine  Answer Assessment - Initial Assessment Questions 1. LOCATION and RADIATION: "Where is the pain located?"      Right knee 2. QUALITY: "What does the pain feel like?"  (e.g., sharp, dull, aching, burning)     Sharp, throbbing, aching 3. SEVERITY: "How bad is the pain?" "What does it keep you from doing?"   (Scale 1-10; or mild, moderate, severe)   -  MILD (1-3): doesn't interfere with normal activities    -  MODERATE (4-7): interferes with normal activities (e.g., work or school) or awakens from sleep, limping    -  SEVERE (8-10): excruciating pain, unable to do any normal activities, unable to walk     10 4. ONSET: "When did the pain start?" "Does it come and go, or is it there all the time?"     Ongoing x 2 months, constant pain 5. RECURRENT: "Have you had this pain before?" If Yes, ask: "When, and what happened then?"     N/A 6. SETTING: "Has there been any recent work, exercise or other activity that involved that part of the body?"      No 7. AGGRAVATING FACTORS: "What makes the knee pain worse?" (e.g., walking, climbing stairs, running)     Walking, standing 8. ASSOCIATED SYMPTOMS: "Is there any swelling or redness of the knee?"     Swelling 9. OTHER SYMPTOMS: "Do you have any other symptoms?" (e.g., chest  pain, difficulty breathing, fever, calf pain)     No 10. PREGNANCY: "Is there any chance you are pregnant?" "When was your last menstrual period?"       N/A  Protocols used: Knee Pain-A-AH

## 2022-07-03 DIAGNOSIS — R29818 Other symptoms and signs involving the nervous system: Secondary | ICD-10-CM | POA: Diagnosis not present

## 2022-07-03 NOTE — Procedures (Signed)
       Patient Name: [name], [name] Date: 06/28/2022 Gender: Female D.O.B: 1966/07/08 Age (years): 27 Referring Provider: Arnoldo Morale Height (inches): 64 Interpreting Physician: Baird Lyons MD, ABSM Weight (lbs): 224 RPSGT: Laren Everts BMI: 87 MRN: [id number] Neck Size: 15.00  CLINICAL INFORMATION Sleep Study Type: NPSG Indication for sleep study: Hypertension, Obesity, OSA, Snoring, Witnessed Apneas Epworth Sleepiness Score: 9  SLEEP STUDY TECHNIQUE As per the AASM Manual for the Scoring of Sleep and Associated Events v2.3 (April 2016) with a hypopnea requiring 4% desaturations.  The channels recorded and monitored were frontal, central and occipital EEG, electrooculogram (EOG), submentalis EMG (chin), nasal and oral airflow, thoracic and abdominal wall motion, anterior tibialis EMG, snore microphone, electrocardiogram, and pulse oximetry.  MEDICATIONS Medications self-administered by patient taken the night of the study : [place] The study was initiated at 10:01:22 PM and ended at 4:47:09 AM.  Sleep onset time was 70.6 minutes and the sleep efficiency was 51.6%%. The total sleep time was 209.5 minutes.  Stage REM latency was 153.5 minutes.  The patient spent 12.6%% of the night in stage N1 sleep, 61.6%% in stage N2 sleep, 0.0%% in stage N3 and 25.8% in REM.  Alpha intrusion was absent.  Supine sleep was 0.00%.  RESPIRATORY PARAMETERS The overall apnea/hypopnea index (AHI) was 6.9 per hour. There were 1 total apneas, including 1 obstructive, 0 central and 0 mixed apneas. There were 23 hypopneas and 31 RERAs.  The AHI during Stage REM sleep was 17.8 per hour.  AHI while supine was N/A per hour.  The mean oxygen saturation was 93.6%. The minimum SpO2 during sleep was 84.0%.  moderate snoring was noted during this study.  CARDIAC DATA The 2 lead EKG demonstrated sinus rhythm. The mean heart rate was 59.2 beats per minute. Other  EKG findings include: None.  LEG MOVEMENT DATA The total PLMS were 0 with a resulting PLMS index of 0.0. Associated arousal with leg movement index was 0.6 .  IMPRESSIONS - Mild obstructive sleep apnea occurred during this study (AHI = 6.9/h). - Mild oxygen desaturation was noted during this study (Min O2 = 84.0%). Mean 93.6% - The patient snored with moderate snoring volume. - Insufficient early sleep and events to meet protocol requirements for split CPAP titration. - No cardiac abnormalities were noted during this study. - Clinically significant periodic limb movements did not occur during sleep. No significant associated arousals.  DIAGNOSIS - Obstructive Sleep Apnea (G47.33)  RECOMMENDATIONS - Treatment for very mild OSA is guided by symptoms and co-morbidities. Conservative measures may include observation, weight loss and sleep position off back. Other options including CPAP, a fitted oral appliance, or ENT evaluation, would be based on clinical judgment. - Be careful with alcohol, sedatives and other CNS depressants that may worsen sleep apnea and disrupt normal sleep architecture. - Sleep hygiene should be reviewed to assess factors that may improve sleep quality. - Weight management and regular exercise should be initiated or continued if appropriate.  [Electronically signed] 07/03/2022 12:09 PM  Baird Lyons MD, [place], American Board of Sleep Medicine NPI: [id number]                      [place], [place] [facility]  ELECTRONICALLY SIGNED ON:  07/03/2022, 12:04 PM [place] PH: [phone]   FX: [phone] [place]

## 2022-07-05 ENCOUNTER — Ambulatory Visit: Payer: Medicaid Other | Admitting: Orthopaedic Surgery

## 2022-07-11 ENCOUNTER — Ambulatory Visit: Payer: Medicaid Other | Admitting: Physician Assistant

## 2022-08-11 ENCOUNTER — Ambulatory Visit: Payer: Self-pay

## 2022-08-11 NOTE — Telephone Encounter (Signed)
Message from Roslynn Amble sent at 08/11/2022 12:26 PM EDT  Summary: Right knee pain   The patient called in stating she has had right knee pain. She has some swelling and has a brace on it but she is very uncomfortable. Her provider does not have an appointment opening until January. Please assist patient further            Chief Complaint: right knee pain Symptoms: swelling, severe pain Frequency: last week Pertinent Negatives: Patient denies chest pain, difficulty breathing, fever, calf pain, redness, fever Disposition: [] ED /[] Urgent Care (no appt availability in office) / [] Appointment(In office/virtual)/ []  [place] [facility]/ [] Home Care/ [x] Refused Recommended Disposition /[]  Mobile Bus/ []  Follow-up with PCP Additional Notes: Pt refused UC stated will wait until orthopedics appt 08/15/22 1415 with Erskine Emery PA-C.  Reason for Disposition  [1] SEVERE pain (e.g., excruciating, unable to walk) AND [2] not improved after 2 hours of pain medicine  Answer Assessment - Initial Assessment Questions 1. LOCATION and RADIATION: "Where is the pain located?"      right 2. QUALITY: "What does the pain feel like?"  (e.g., sharp, dull, aching, burning)     sharp 3. SEVERITY: "How bad is the pain?" "What does it keep you from doing?"   (Scale 1-10; or mild, moderate, severe)   -  MILD (1-3): doesn't interfere with normal activities    -  MODERATE (4-7): interferes with normal activities (e.g., work or school) or awakens from sleep, limping    -  SEVERE (8-10): excruciating pain, unable to do any normal activities, unable to walk     10 4. ONSET: "When did the pain start?" "Does it come and go, or is it there all the time?"     Last week- constant 5. RECURRENT: "Have you had this pain before?" If Yes, ask: "When, and what happened then?"     no 6. SETTING: "Has there been any recent work, exercise or other activity that involved that part of the body?"      no 7.  AGGRAVATING FACTORS: "What makes the knee pain worse?" (e.g., walking, climbing stairs, running)    Pain is constant 8. ASSOCIATED SYMPTOMS: "Is there any swelling or redness of the knee?"     swelling 9. OTHER SYMPTOMS: "Do you have any other symptoms?" (e.g., chest pain, difficulty breathing, fever, calf pain)     none 10. PREGNANCY: "Is there any chance you are pregnant?" "When was your last menstrual period?"       N/a  Protocols used: Knee Pain-A-AH

## 2022-08-12 ENCOUNTER — Other Ambulatory Visit: Payer: Self-pay

## 2022-08-12 MED ORDER — PREDNISONE 20 MG PO TABS
20.0000 mg | ORAL_TABLET | Freq: Every day | ORAL | 0 refills | Status: DC
  Filled 2022-08-12: qty 5, 5d supply, fill #0

## 2022-08-12 NOTE — Telephone Encounter (Signed)
Left message on voicemail informing that Rx was sent to pharmacy.

## 2022-08-12 NOTE — Telephone Encounter (Signed)
I have sent a prescription for 5-day course of prednisone to her pharmacy.

## 2022-08-12 NOTE — Addendum Note (Signed)
Addended by: Charlott Rakes on: 08/12/2022 11:18 AM   Modules accepted: Orders

## 2022-08-15 ENCOUNTER — Ambulatory Visit: Payer: Medicaid Other | Admitting: Physician Assistant

## 2022-08-19 ENCOUNTER — Other Ambulatory Visit: Payer: Self-pay

## 2022-08-24 ENCOUNTER — Ambulatory Visit: Payer: Self-pay | Admitting: *Deleted

## 2022-08-24 NOTE — Telephone Encounter (Signed)
Message from [employer] sent at 08/24/2022  2:23 PM EST  Summary: right ear discomfort   The patient has experienced a headache as well as right ear discomfort  The patient shares that their balance has been affected by their discomfort  The patient shares that they have not stumbled or fallen  The patient would like to speak with a member of clinical staff further when possible          Call History   Type Contact Phone/Fax User  08/24/2022 02:23 PM EST Phone (Incoming) [name], Jenna (Self) [phone] Rexene Edison) [name], [name]   Reason for Disposition  [1] SEVERE pain AND [2] not improved 2 hours after taking analgesic medication (e.g., ibuprofen or acetaminophen)  Answer Assessment - Initial Assessment Questions 1. LOCATION: Right ear is hurting and I have a bad headache      Pain in right ear and a bad headache  2. ONSET: "When did the ear start hurting"      Today 3. SEVERITY: "How bad is the pain?"  (Scale 1-10; mild, moderate or severe)   - MILD moderate  Protocols used: Earache-A-AH

## 2022-08-24 NOTE — Telephone Encounter (Signed)
  Chief Complaint: Right ear is hurting and she has a bad headache that ibuprofen is not helping. Symptoms: Pressure and pain in right ear.   "Bad headache" Frequency: Started this morning.    Pertinent Negatives: Patient denies other symptoms except a very mild runny nose that is my allergies.   Disposition: [] ED /[x] Urgent Care (no appt availability in office) / [] Appointment(In office/virtual)/ []  [place] [facility]/ [] Home Care/ [] Refused Recommended Disposition /[] [place] [facility]/ []  Follow-up with PCP Additional Notes: No appts in [facility] so she is going to the [facility] [place] in front of [facility].   Offered a virtual visit however she did not want to do that due to trouble with technology.

## 2022-08-25 ENCOUNTER — Ambulatory Visit
Admission: RE | Admit: 2022-08-25 | Discharge: 2022-08-25 | Disposition: A | Discharge status: Home / Self Care | Payer: Medicaid Other | Source: Ambulatory Visit | Attending: Physician Assistant | Admitting: Physician Assistant

## 2022-08-25 VITALS — BP 146/87 | HR 69 | Temp 97.9°F | Resp 18

## 2022-08-25 DIAGNOSIS — R519 Headache, unspecified: Secondary | ICD-10-CM | POA: Diagnosis not present

## 2022-08-25 MED ORDER — KETOROLAC TROMETHAMINE 30 MG/ML IJ SOLN
30.0000 mg | Freq: Once | INTRAMUSCULAR | Status: AC
  Administered 2022-08-25: 30 mg via INTRAMUSCULAR

## 2022-08-25 NOTE — Discharge Instructions (Signed)
Please report to ED if headache worsens or fails to improve.

## 2022-08-25 NOTE — ED Triage Notes (Signed)
Pt presents with left ear pain, headache, and light sensitivity since yesterday.

## 2022-08-25 NOTE — ED Provider Notes (Signed)
[name]    CSN: [id number] Arrival date & time: 08/25/22  0818      History   Chief Complaint Chief Complaint  Patient presents with   Headache   Otalgia    HPI [name] is a 56 y.o. female.   Patient here today for evaluation of the left ear pain, headache, and light sensitivity that started yesterday.  She does have history of migraines but none that have been this significant.  She states that headache is present to her frontal area.  She has not had any fever.  She denies any congestion or cough.  She has not had any nausea, vomiting or diarrhea.  She denies any numbness or tingling. She has tried over-the-counter medication without resolution.  The history is provided by the patient.  Headache Associated symptoms: ear pain and photophobia   Associated symptoms: no congestion, no cough, no diarrhea, no fever, no nausea and no vomiting   Otalgia Associated symptoms: headaches   Associated symptoms: no congestion, no cough, no diarrhea, no fever and no vomiting     Past Medical History:  Diagnosis Date   Asthma    Gastric reflux    History of blood transfusion yrs agio, none recent   Hypertension    Osteoarthritis    Sleep apnea    cpap machine has not used since 2014 due to machine/tubing in bad shape    Syncope and collapse yrs ago    Patient Active Problem List   Diagnosis Date Noted   Suspected sleep apnea 06/28/2022   Primary osteoarthritis of right knee 07/01/2021   Primary osteoarthritis of left knee 07/01/2021   Developmental delay 09/17/2020   Pre-diabetes 07/07/2020   Dyspnea on exertion 07/07/2020   Abnormal ECG 07/07/2020   Obesity 01/29/2018   Acute left-sided low back pain with left-sided sciatica 11/27/2017   Low back pain 10/30/2017   Itching with irritation 06/23/2016   Gastric reflux 05/04/2016   Asthma 05/04/2016   Essential hypertension 07/23/2015   Mild depression 07/23/2015   Chest pain 06/06/2012   Murmur,  cardiac 06/06/2012    Past Surgical History:  Procedure Laterality Date   CARDIAC CATHETERIZATION     more than 5 years ago   CARDIAC CATHETERIZATION  06-07-2012   [facility]: Normal coronary arteries. No significant LVOT/aortic valve gradient   CHOLECYSTECTOMY     COLONOSCOPY WITH PROPOFOL N/A 02/27/2014   Procedure: COLONOSCOPY WITH PROPOFOL;  Surgeon: Garlan Fair, MD;  Location: [facility];  Service: Endoscopy;  Laterality: N/A;   ESOPHAGOGASTRODUODENOSCOPY (EGD) WITH PROPOFOL N/A 02/27/2014   Procedure: ESOPHAGOGASTRODUODENOSCOPY (EGD) WITH PROPOFOL;  Surgeon: Garlan Fair, MD;  Location: [facility];  Service: Endoscopy;  Laterality: N/A;   EXPLORATORY LAPAROTOMY  09/2001   ELAP, LOA, R partial oophorectomy, repair of bowel injury   VAGINAL HYSTERECTOMY  2001   Adnexa left in place    OB History     Gravida  2   Para  2   Term  2   Preterm      AB      Living  2      SAB      IAB      Ectopic      Multiple      Live Births               Home Medications    Prior to Admission medications   Medication Sig Start Date End Date Taking? Authorizing Provider  acetaminophen-codeine (  TYLENOL #3) 300-30 MG tablet Take 1 tablet by mouth every 4 (four) hours as needed for moderate pain. 03/24/21   Pete Pelt, PA-C  albuterol (VENTOLIN HFA) 108 (90 Base) MCG/ACT inhaler Inhale 2 puffs into the lungs every 6 (six) hours as needed for wheezing or shortness of breath. 10/28/21   Argentina Donovan, PA-C  atorvastatin (LIPITOR) 20 MG tablet Take 1 tablet (20 mg total) by mouth daily. 04/20/22   Charlott Rakes, MD  benzonatate (TESSALON) 100 MG capsule Take 1 capsule (100 mg total) by mouth 3 (three) times daily as needed for cough. 03/16/22   Brunetta Jeans, PA-C  Blood Pressure KIT Use as instructed Dx: Hypertension 07/15/19   Charlott Rakes, MD  budesonide-formoterol (SYMBICORT) 160-4.5 MCG/ACT inhaler Inhale 2 puffs into the lungs 2 (two) times daily.  04/20/22   Charlott Rakes, MD  cetirizine (ZYRTEC) 10 MG tablet Take 1 tablet (10 mg total) by mouth daily. 04/20/22   Charlott Rakes, MD  diclofenac Sodium (VOLTAREN) 1 % GEL Apply 2 g topically 4 (four) times daily. 10/28/21   Argentina Donovan, PA-C  doxycycline (VIBRA-TABS) 100 MG tablet Take 1 tablet (100 mg total) by mouth 2 (two) times daily. Patient not taking: Reported on 04/30/2022 03/16/22   Brunetta Jeans, PA-C  ergocalciferol (DRISDOL) 1.25 MG (50000 UT) capsule Take 1 capsule (50,000 Units total) by mouth once a week. 05/05/22   Charlott Rakes, MD  ipratropium (ATROVENT) 0.03 % nasal spray Place 2 sprays into both nostrils every 12 (twelve) hours. 04/20/22   Charlott Rakes, MD  metoprolol succinate (TOPROL-XL) 25 MG 24 hr tablet TAKE 1/2 TABLET(12.5 MG) BY MOUTH DAILY. 04/20/22   Charlott Rakes, MD  naproxen (NAPROSYN) 500 MG tablet Take 1 tablet (500 mg total) by mouth 2 (two) times daily with a meal. 01/05/22   Charlott Rakes, MD  ondansetron (ZOFRAN-ODT) 4 MG disintegrating tablet Take 1 tablet (4 mg total) by mouth every 6 (six) hours as needed for nausea or vomiting. 06/08/22   Lamptey, Myrene Galas, MD  potassium chloride (KLOR-CON) 10 MEQ tablet Take 1 tablet (10 mEq total) by mouth 2 (two) times daily. 05/05/22   Charlott Rakes, MD  predniSONE (DELTASONE) 20 MG tablet Take 1 tablet (20 mg total) by mouth daily with breakfast. 08/12/22   Charlott Rakes, MD  promethazine (PHENERGAN) 25 MG tablet Take 1 tablet (25 mg total) by mouth every 8 (eight) hours as needed for nausea or vomiting. 06/08/22   Charlott Rakes, MD  fluticasone (FLONASE) 50 MCG/ACT nasal spray Place 2 sprays into both nostrils daily. 07/15/19 04/01/20  Charlott Rakes, MD  potassium chloride (K-DUR,KLOR-CON) 10 MEQ tablet Takes 1 tablet every other day.  06/04/12  [provider]    Family History Family History  Family history unknown: Yes    Social History Social History   Tobacco Use   Smoking  status: Never   Smokeless tobacco: Never  Vaping Use   Vaping Use: Never used  Substance Use Topics   Alcohol use: No   Drug use: No     Allergies   Tramadol   Review of Systems Review of Systems  Constitutional:  Negative for chills and fever.  HENT:  Positive for ear pain. Negative for congestion.   Eyes:  Positive for photophobia. Negative for discharge and redness.  Respiratory:  Negative for cough and shortness of breath.   Gastrointestinal:  Negative for diarrhea, nausea and vomiting.  Neurological:  Positive for headaches.  Physical Exam Triage Vital Signs ED Triage Vitals  Enc Vitals Group     BP 08/25/22 0920 (!) 146/87     Pulse Rate 08/25/22 0920 69     Resp 08/25/22 0920 18     Temp 08/25/22 0920 97.9 F (36.6 C)     Temp Source 08/25/22 0920 Oral     SpO2 08/25/22 0920 94 %     Weight --      Height --      Head Circumference --      Peak Flow --      Pain Score 08/25/22 0919 6     Pain Loc --      Pain Edu? --      Excl. in [place]? --    No data found.  Updated Vital Signs BP (!) 146/87 (BP Location: Right Arm)   Pulse 69   Temp 97.9 F (36.6 C) (Oral)   Resp 18   SpO2 94%   Physical Exam Vitals and nursing note reviewed.  Constitutional:      General: She is not in acute distress.    Appearance: She is well-developed. She is not ill-appearing.  HENT:     Head: Normocephalic and atraumatic.  Eyes:     Conjunctiva/sclera: Conjunctivae normal.  Cardiovascular:     Rate and Rhythm: Normal rate.  Pulmonary:     Effort: Pulmonary effort is normal. No respiratory distress.  Neurological:     Mental Status: She is alert.  Psychiatric:        Mood and Affect: Mood normal.        Behavior: Behavior normal.      UC Treatments / Results  Labs (all labs ordered are listed, but only abnormal results are displayed) Labs Reviewed - No data to display  EKG   Radiology No results found.  Procedures Procedures (including critical  care time)  Medications Ordered in UC Medications  ketorolac (TORADOL) 30 MG/ML injection 30 mg (30 mg Intramuscular Given 08/25/22 0953)    Initial Impression / Assessment and Plan / UC Course  I have reviewed the triage vital signs and the nursing notes.  Pertinent labs & imaging results that were available during my care of the patient were reviewed by me and considered in my medical decision making (see chart for details).    Toradol injection administered in office. Recommended she avoid ibuprofen for today, ok to take tylenol if needed for pain. Recommend further evaluation in the ED if she has any worsening pain.   Final Clinical Impressions(s) / UC Diagnoses   Final diagnoses:  Acute nonintractable headache, unspecified headache type     Discharge Instructions      Please report to ED if headache worsens or fails to improve.    ED Prescriptions   None    PDMP not reviewed this encounter.   Francene Finders, PA-C 08/25/22 [phone]

## 2022-09-05 ENCOUNTER — Ambulatory Visit: Payer: Medicaid Other

## 2022-09-05 DIAGNOSIS — Z23 Encounter for immunization: Secondary | ICD-10-CM

## 2022-10-18 ENCOUNTER — Ambulatory Visit: Payer: Self-pay

## 2022-10-18 NOTE — Telephone Encounter (Signed)
    Chief Complaint: Migraine. Asking to be worked in [employer]. Symptoms: Pain Frequency: 1 week ago Pertinent Negatives: Patient denies any other symptoms Disposition: [] ED /[] Urgent Care (no appt availability in office) / [] Appointment(In office/virtual)/ []  [place] [facility]/ [] Home Care/ [] Refused Recommended Disposition /[] [place] Mobile [facility]/ [x]  Follow-up with PCP Additional Notes: Please advise   Answer Assessment - Initial Assessment Questions 1. LOCATION: "Where does it hurt?"      Front 2. ONSET: "When did the headache start?" (Minutes, hours or days)      Last week 3. PATTERN: "Does the pain come and go, or has it been constant since it started?"     Constant 4. SEVERITY: "How bad is the pain?" and "What does it keep you from doing?"  (e.g., Scale 1-10; mild, moderate, or severe)   - MILD (1-3): doesn't interfere with normal activities    - MODERATE (4-7): interferes with normal activities or awakens from sleep    - SEVERE (8-10): excruciating pain, unable to do any normal activities        10 5. RECURRENT SYMPTOM: "Have you ever had headaches before?" If Yes, ask: "When was the last time?" and "What happened that time?"      Yes 6. CAUSE: "What do you think is causing the headache?"     Migraine 7. MIGRAINE: "Have you been diagnosed with migraine headaches?" If Yes, ask: "Is this headache similar?"      Yes 8. HEAD INJURY: "Has there been any recent injury to the head?"      No 9. OTHER SYMPTOMS: "Do you have any other symptoms?" (fever, stiff neck, eye pain, sore throat, cold symptoms)     No 10. PREGNANCY: "Is there any chance you are pregnant?" "When was your last menstrual period?"       No  Protocols used: Headache-A-AH

## 2022-10-18 NOTE — Telephone Encounter (Signed)
Scheduled apt at  [facility] with Carrolyn Meiers, PA-C

## 2022-10-19 ENCOUNTER — Encounter: Payer: Self-pay | Admitting: Physician Assistant

## 2022-10-19 ENCOUNTER — Ambulatory Visit (INDEPENDENT_AMBULATORY_CARE_PROVIDER_SITE_OTHER): Payer: Medicaid Other | Admitting: Physician Assistant

## 2022-10-19 VITALS — BP 127/78 | HR 59 | Temp 97.9°F | Resp 16 | Ht 65.0 in | Wt 227.0 lb

## 2022-10-19 DIAGNOSIS — I10 Essential (primary) hypertension: Secondary | ICD-10-CM | POA: Diagnosis not present

## 2022-10-19 DIAGNOSIS — R7303 Prediabetes: Secondary | ICD-10-CM

## 2022-10-19 DIAGNOSIS — E876 Hypokalemia: Secondary | ICD-10-CM

## 2022-10-19 DIAGNOSIS — E559 Vitamin D deficiency, unspecified: Secondary | ICD-10-CM

## 2022-10-19 DIAGNOSIS — G43809 Other migraine, not intractable, without status migrainosus: Secondary | ICD-10-CM | POA: Diagnosis not present

## 2022-10-19 MED ORDER — SUMATRIPTAN SUCCINATE 50 MG PO TABS: 50.0000 mg | ORAL_TABLET | ORAL | 0 refills | Status: AC | PRN

## 2022-10-19 NOTE — Progress Notes (Unsigned)
C/o HA x 2 weeks Pain 10/10  Denies n/v or dizziness

## 2022-10-19 NOTE — Progress Notes (Unsigned)
Established Patient Office Visit  Subjective   Patient ID: [name], female    DOB: [birth year]  Age: 57 y.o. MRN: [id number]  Chief Complaint  Patient presents with   Headache    States that she has a significant history of migraines and has been experiencing them  on a daily basis for the past two weeks.  States that she generally can abort the headache using Tylenol or ibuprofen, however this one is not going away.  States that she has been using Tylenol, cold compresses, dark rooms.  Endorses photophobia, denies nausea or vomiting.  Does endorse high stress level.  States that she generally will use prior to help reduce her stress.  States that she has not been sleeping well due to the migraine, prior to her extended migraine she was sleeping fine.  States that she does drink plenty of water on a daily basis, states that she is currently doing a fast at her church, but is consuming water, vegetables and fruits.  States that she had the migraine prior to starting her fast.  Endorses history of vitamin D deficiency, states that she takes vitamin D on a daily basis.  Reports that she did have cataract surgery in the past, last vision check was a year ago.     Past Medical History:  Diagnosis Date   Asthma    Gastric reflux    History of blood transfusion yrs agio, none recent   Hypertension    Osteoarthritis    Sleep apnea    cpap machine has not used since 2014 due to machine/tubing in bad shape    Syncope and collapse yrs ago   Social History   Socioeconomic History   Marital status: Single    Spouse name: Not on file   Number of children: Not on file   Years of education: Not on file   Highest education level: Not on file  Occupational History   Not on file  Tobacco Use   Smoking status: Never   Smokeless tobacco: Never  Vaping Use   Vaping Use: Never used  Substance and Sexual Activity   Alcohol use: No   Drug use: No   Sexual activity: Never    Birth  control/protection: Surgical  Other Topics Concern   Not on file  Social History Narrative   Not on file   Social Determinants of Health   Financial Resource Strain: Not on file  Food Insecurity: Not on file  Transportation Needs: Not on file  Physical Activity: Inactive (01/02/2022)   Exercise Vital Sign    Days of Exercise per Week: 0 days    Minutes of Exercise per Session: 0 min  Stress: Not on file  Social Connections: Not on file  Intimate Partner Violence: Not on file   Family History  Family history unknown: Yes   Allergies  Allergen Reactions   Tramadol Hives, Itching and Swelling    Pt states after medication intake she had to go to the ER.     Review of Systems  Constitutional:  Negative for chills and fever.  HENT:  Negative for ear pain, hearing loss and tinnitus.   Eyes:  Positive for photophobia.  Respiratory:  Negative for shortness of breath.   Cardiovascular:  Negative for chest pain.  Gastrointestinal:  Negative for abdominal pain, nausea and vomiting.  Genitourinary: Negative.   Musculoskeletal: Negative.   Skin: Negative.   Neurological:  Positive for headaches. Negative for dizziness, sensory change, speech change,  focal weakness, seizures and weakness.  Endo/Heme/Allergies: Negative.   Psychiatric/Behavioral: Negative.        Objective:     BP 127/78 (BP Location: Right Arm, Patient Position: Sitting, Cuff Size: Large)   Pulse (!) 59   Temp 97.9 F (36.6 C) (Oral)   Resp 16   Ht 5\' 5"  (1.651 m)   Wt 227 lb (103 kg)   SpO2 97%   BMI 37.77 kg/m     Physical Exam Vitals and nursing note reviewed.  Constitutional:      Appearance: Normal appearance. She is well-developed.  HENT:     Head: Normocephalic and atraumatic.     Right Ear: Tympanic membrane, ear canal and external ear normal.     Left Ear: Tympanic membrane, ear canal and external ear normal.     Nose: Nose normal.     Mouth/Throat:     Mouth: Mucous membranes are moist.      Pharynx: Oropharynx is clear.  Eyes:     Extraocular Movements: Extraocular movements intact.     Conjunctiva/sclera: Conjunctivae normal.     Pupils: Pupils are equal, round, and reactive to light.  Cardiovascular:     Rate and Rhythm: Normal rate and regular rhythm.     Heart sounds: Murmur heard.  Pulmonary:     Effort: Pulmonary effort is normal.     Breath sounds: Normal breath sounds.  Musculoskeletal:        General: Normal range of motion.     Cervical back: Normal range of motion and neck supple.  Skin:    General: Skin is warm and dry.  Neurological:     General: No focal deficit present.     Mental Status: She is alert and oriented to person, place, and time.  Psychiatric:        Mood and Affect: Mood normal.        Behavior: Behavior normal.        Thought Content: Thought content normal.        Judgment: Judgment normal.          Assessment & Plan:   Problem List Items Addressed This Visit       Cardiovascular and Mediastinum   Essential hypertension     Other   Pre-diabetes   Other Visit Diagnoses     Other migraine without status migrainosus, not intractable    -  Primary   Vitamin D deficiency       Hypokalemia          1. Other migraine without status migrainosus, not intractable Trial Imitrex, patient education given on supportive care, red flags given for prompt reevaluation - Basic metabolic panel - CBC with Differential/Platelet - TSH - SUMAtriptan (IMITREX) 50 MG tablet; Take 1 tablet (50 mg total) by mouth every 2 (two) hours as needed for migraine. May repeat in 2 hours if headache persists or recurs.  Dispense: 10 tablet; Refill: 0  2. Essential hypertension   3. Vitamin D deficiency  - Vitamin D, 25-hydroxy  4. Hypokalemia    I have reviewed the patient's medical history (PMH, PSH, Social History, Family History, Medications, and allergies) , and have been updated if relevant. I spent 30 minutes reviewing chart and   face to face time with patient.    No follow-ups on file.    Loraine Grip Mayers, PA-C

## 2022-10-19 NOTE — Patient Instructions (Signed)
To help with your migraine, you are going to use sumatriptan.  If the first dose does not resolve your pain, you can take a second dose 2 hours later, but you cannot take more than 2 doses in a 24-hour.  I encourage you to work on improving your stress, work on releasing the tension in your neck and shoulders.  Continue to stay well-hydrated.  Make sure that you are getting plenty of rest.  We will call you with today's lab results.  I hope that you feel better soon, please let us know if there is anything else we can do for you  Roney Jaffe, PA-C Physician Assistant [facility] [email or web address]   Migraine Headache A migraine headache is an intense, throbbing pain on one side or both sides of the head. Migraine headaches may also cause other symptoms, such as nausea, vomiting, and sensitivity to light and noise. A migraine headache can last from 4 hours to 3 days. Talk with your doctor about what things may bring on (trigger) your migraine headaches. What are the causes? The exact cause of this condition is not known. However, a migraine may be caused when nerves in the brain become irritated and release chemicals that cause inflammation of blood vessels. This inflammation causes pain. This condition may be triggered or caused by: Drinking alcohol. Smoking. Taking medicines, such as: Medicine used to treat chest pain (nitroglycerin). Birth control pills. Estrogen. Certain blood pressure medicines. Eating or drinking products that contain nitrates, glutamate, aspartame, or tyramine. Aged cheeses, chocolate, or caffeine may also be triggers. Doing physical activity. Other things that may trigger a migraine headache include: Menstruation. Pregnancy. Hunger. Stress. Lack of sleep or too much sleep. Weather changes. Fatigue. What increases the risk? The following factors may make you more likely to experience migraine  headaches: Being a certain age. This condition is more common in people who are 49-42 years old. Being female. Having a family history of migraine headaches. Being Caucasian. Having a mental health condition, such as depression or anxiety. Being obese. What are the signs or symptoms? The main symptom of this condition is pulsating or throbbing pain. This pain may: Happen in any area of the head, such as on one side or both sides. Interfere with daily activities. Get worse with physical activity. Get worse with exposure to bright lights or loud noises. Other symptoms may include: Nausea. Vomiting. Dizziness. General sensitivity to bright lights, loud noises, or smells. Before you get a migraine headache, you may get warning signs (an aura). An aura may include: Seeing flashing lights or having blind spots. Seeing bright spots, halos, or zigzag lines. Having tunnel vision or blurred vision. Having numbness or a tingling feeling. Having trouble talking. Having muscle weakness. Some people have symptoms after a migraine headache (postdromal phase), such as: Feeling tired. Difficulty concentrating. How is this diagnosed? A migraine headache can be diagnosed based on: Your symptoms. A physical exam. Tests, such as: CT scan or an MRI of the head. These imaging tests can help rule out other causes of headaches. Taking fluid from the spine (lumbar puncture) and analyzing it (cerebrospinal fluid analysis, or CSF analysis). How is this treated? This condition may be treated with medicines that: Relieve pain. Relieve nausea. Prevent migraine headaches. Treatment for this condition may also include: Acupuncture. Lifestyle changes like avoiding foods that trigger migraine headaches. Biofeedback. Cognitive behavioral therapy. Follow these instructions at home: Medicines Take over-the-counter and prescription medicines only as told by your health  care provider. Ask your health care  provider if the medicine prescribed to you: Requires you to avoid driving or using heavy machinery. Can cause constipation. You may need to take these actions to prevent or treat constipation: Drink enough fluid to keep your urine pale yellow. Take over-the-counter or prescription medicines. Eat foods that are high in fiber, such as beans, whole grains, and fresh fruits and vegetables. Limit foods that are high in fat and processed sugars, such as fried or sweet foods. Lifestyle Do not drink alcohol. Do not use any products that contain nicotine or tobacco, such as cigarettes, e-cigarettes, and chewing tobacco. If you need help quitting, ask your health care provider. Get at least 8 hours of sleep every night. Find ways to manage stress, such as meditation, deep breathing, or yoga. General instructions Keep a journal to find out what may trigger your migraine headaches. For example, write down: What you eat and drink. How much sleep you get. Any change to your diet or medicines. If you have a migraine headache: Avoid things that make your symptoms worse, such as bright lights. It may help to lie down in a dark, quiet room. Do not drive or use heavy machinery. Ask your health care provider what activities are safe for you while you are experiencing symptoms. Keep all follow-up visits as told by your health care provider. This is important. Contact a health care provider if: You develop symptoms that are different or more severe than your usual migraine headache symptoms. You have more than 15 headache days in one month. Get help right away if: Your migraine headache becomes severe. Your migraine headache lasts longer than 72 hours. You have a fever. You have a stiff neck. You have vision loss. Your muscles feel weak or like you cannot control them. You start to lose your balance often. You have trouble walking. You faint. You have a seizure. Summary A migraine headache is an  intense, throbbing pain on one side or both sides of the head. Migraines may also cause other symptoms, such as nausea, vomiting, and sensitivity to light and noise. This condition may be treated with medicines and lifestyle changes. You may also need to avoid certain things that trigger a migraine headache. Keep a journal to find out what may trigger your migraine headaches. Contact your health care provider if you have more than 15 headache days in a month or you develop symptoms that are different or more severe than your usual migraine headache symptoms. This information is not intended to replace advice given to you by your health care provider. Make sure you discuss any questions you have with your health care provider. Document Revised: 03/10/2022 Document Reviewed: 11/08/2018 Elsevier Patient Education  2023 Elsevier Inc.  Managing Stress, Adult Feeling a certain amount of stress is normal. Stress helps our body and mind get ready to deal with the demands of life. Stress hormones can motivate you to do well at work and meet your responsibilities. But severe or long-term (chronic) stress can affect your mental and physical health. Chronic stress puts you at higher risk for: Anxiety and depression. Other health problems such as digestive problems, muscle aches, heart disease, high blood pressure, and stroke. What are the causes? Common causes of stress include: Demands from work, such as deadlines, feeling overworked, or having long hours. Pressures at home, such as money issues, disagreements with a spouse, or parenting issues. Pressures from major life changes, such as divorce, moving, loss of a loved  one, or chronic illness. You may be at higher risk for stress-related problems if you: Do not get enough sleep. Are in poor health. Do not have emotional support. Have a mental health disorder such as anxiety or depression. How to recognize stress Stress can make you: Have trouble  sleeping. Feel sad, anxious, irritable, or overwhelmed. Lose your appetite. Overeat or want to eat unhealthy foods. Want to use drugs or alcohol. Stress can also cause physical symptoms, such as: Sore, tense muscles, especially in the shoulders and neck. Headaches. Trouble breathing. A faster heart rate. Stomach pain, nausea, or vomiting. Diarrhea or constipation. Trouble concentrating. Follow these instructions at home: Eating and drinking Eat a healthy diet. This includes: Eating foods that are high in fiber, such as beans, whole grains, and fresh fruits and vegetables. Limiting foods that are high in fat and processed sugars, such as fried or sweet foods. Do not skip meals or overeat. Drink enough fluid to keep your urine pale yellow. Alcohol use Do not drink alcohol if: Your health care provider tells you not to drink. You are pregnant, may be pregnant, or are planning to become pregnant. Drinking alcohol is a way some people try to ease their stress. This can be dangerous, so if you drink alcohol: Limit how much you have to: 0-1 drink a day for women. 0-2 drinks a day for men. Know how much alcohol is in your drink. In the [place] one drink equals one 12 oz bottle of beer (355 mL), one 5 oz glass of wine (148 mL), or one 1 oz glass of hard liquor (44 mL). Activity  Include 30 minutes of exercise in your daily schedule. Exercise is a good stress reducer. Include time in your day for an activity that you find relaxing. Try taking a walk, going on a bike ride, reading a book, or listening to music. Schedule your time in a way that lowers stress, and keep a regular schedule. Focus on doing what is most important to get done. Lifestyle Identify the source of your stress and your reaction to it. See a therapist who can help you change unhelpful reactions. When there are stressful events: Talk about them with family, friends, or coworkers. Try to think realistically about stressful  events and not ignore them or overreact. Try to find the positives in a stressful situation and not focus on the negatives. Cut back on responsibilities at work and home, if possible. Ask for help from friends or family members if you need it. Find ways to manage stress, such as: Mindfulness, meditation, or deep breathing. Yoga or tai chi. Progressive muscle relaxation. Spending time in nature. Doing art, playing music, or reading. Making time for fun activities. Spending time with family and friends. Get support from family, friends, or spiritual resources. General instructions Get enough sleep. Try to go to sleep and get up at about the same time every day. Take over-the-counter and prescription medicines only as told by your health care provider. Do not use any products that contain nicotine or tobacco. These products include cigarettes, chewing tobacco, and vaping devices, such as e-cigarettes. If you need help quitting, ask your health care provider. Do not use drugs or smoke to deal with stress. Keep all follow-up visits. This is important. Where to find support Talk with your health care provider about stress management or finding a support group. Find a therapist to work with you on your stress management techniques. Where to find more information [employer] on Mental  Illness: www.nami.org American Psychological Association: TVStere[employer].ch Contact a health care provider if: Your stress symptoms get worse. You are unable to manage your stress at home. You are struggling to stop using drugs or alcohol. Get help right away if: You may be a danger to yourself or others. You have any thoughts of death or suicide. Get help right awayif you feel like you may hurt yourself or others, or have thoughts about taking your own life. Go to your nearest emergency room or: Call 911. Call the [facility] at 503 061 9836 or 988 in the [place] This is open 24 hours  a day. Text the Crisis Text Line at 579-569-4100. Summary Feeling a certain amount of stress is normal, but severe or long-term (chronic) stress can affect your mental and physical health. Chronic stress can put you at higher risk for anxiety, depression, and other health problems such as digestive problems, muscle aches, heart disease, high blood pressure, and stroke. You may be at higher risk for stress-related problems if you do not get enough sleep, are in poor health, lack emotional support, or have a mental health disorder such as anxiety or depression. Identify the source of your stress and your reaction to it. Try talking about stressful events with family, friends, or coworkers, finding a coping method, or getting support from spiritual resources. If you need more help, talk with your health care provider about finding a support group or a mental health therapist. This information is not intended to replace advice given to you by your health care provider. Make sure you discuss any questions you have with your health care provider. Document Revised: 04/22/2021 Document Reviewed: 04/20/2021 Elsevier Patient Education  [place].

## 2022-10-20 DIAGNOSIS — E559 Vitamin D deficiency, unspecified: Secondary | ICD-10-CM | POA: Insufficient documentation

## 2022-10-20 LAB — CBC WITH DIFFERENTIAL/PLATELET
Basophils Absolute: 0 10*3/uL (ref 0.0–0.2)
Basos: 1 %
EOS (ABSOLUTE): 0.1 10*3/uL (ref 0.0–0.4)
Eos: 1 %
Hematocrit: 41.4 % (ref 34.0–46.6)
Hemoglobin: 13.7 g/dL (ref 11.1–15.9)
Immature Grans (Abs): 0 10*3/uL (ref 0.0–0.1)
Immature Granulocytes: 0 %
Lymphocytes Absolute: 1.7 10*3/uL (ref 0.7–3.1)
Lymphs: 30 %
MCH: 27.6 pg (ref 26.6–33.0)
MCHC: 33.1 g/dL (ref 31.5–35.7)
MCV: 84 fL (ref 79–97)
Monocytes Absolute: 0.4 10*3/uL (ref 0.1–0.9)
Monocytes: 8 %
Neutrophils Absolute: 3.3 10*3/uL (ref 1.4–7.0)
Neutrophils: 60 %
Platelets: 317 10*3/uL (ref 150–450)
RBC: 4.96 x10E6/uL (ref 3.77–5.28)
RDW: 12.8 % (ref 11.7–15.4)
WBC: 5.6 10*3/uL (ref 3.4–10.8)

## 2022-10-20 LAB — BASIC METABOLIC PANEL
BUN/Creatinine Ratio: 19 (ref 9–23)
BUN: 13 mg/dL (ref 6–24)
CO2: 23 mmol/L (ref 20–29)
Calcium: 9.8 mg/dL (ref 8.7–10.2)
Chloride: 106 mmol/L (ref 96–106)
Creatinine, Ser: 0.67 mg/dL (ref 0.57–1.00)
Glucose: 87 mg/dL (ref 70–99)
Potassium: 3.6 mmol/L (ref 3.5–5.2)
Sodium: 144 mmol/L (ref 134–144)
eGFR: 101 mL/min/{1.73_m2} (ref >59)

## 2022-10-20 LAB — TSH: TSH: 1.58 u[IU]/mL (ref 0.450–4.500)

## 2022-10-20 LAB — VITAMIN D 25 HYDROXY (VIT D DEFICIENCY, FRACTURES): Vit D, 25-Hydroxy: 20.7 ng/mL — ABNORMAL LOW (ref 30.0–100.0)

## 2022-10-20 MED ORDER — VITAMIN D (ERGOCALCIFEROL) 1.25 MG (50000 UNIT) PO CAPS: 50000.0000 [IU] | ORAL_CAPSULE | ORAL | 2 refills | Status: DC

## 2022-10-20 NOTE — Addendum Note (Signed)
Addended by: Kennieth Rad on: 10/20/2022 09:32 AM   Modules accepted: Orders

## 2022-12-05 ENCOUNTER — Ambulatory Visit: Payer: Self-pay | Admitting: *Deleted

## 2022-12-05 NOTE — Telephone Encounter (Signed)
  Chief Complaint: SOB Symptoms: Mild cough, stuffy nose, sinus pain and pressure Frequency: 1 week, worse last night Pertinent Negatives: Patient denies fever, body aches Disposition: []$ ED /[]$ Urgent Care (no appt availability in office) / []$ Appointment(In office/virtual)/ []$  [facility]/ []$ Home Care/ []$ Refused Recommended Disposition /[x]$ [facility]/ []$  Follow-up with PCP Additional Notes: Pt states SOB "Can't breathe with these sinus' The weather messing me up." No availability at practice, advised [facility], states will follow disposition. Care advise provided, pt verbalizes understanding.  Reason for Disposition  [1] MODERATE longstanding difficulty breathing (e.g., speaks in phrases, SOB even at rest, pulse 100-120) AND [2] SAME as normal    Sinus Issues  Answer Assessment - Initial Assessment Questions 1. RESPIRATORY STATUS: "Describe your breathing?" (e.g., wheezing, shortness of breath, unable to speak, severe coughing)    SOB  2. ONSET: "When did this breathing problem begin?"      1 week ago, worsening last night 3. PATTERN "Does the difficult breathing come and go, or has it been constant since it started?"      Constant 4. SEVERITY: "How bad is your breathing?" (e.g., mild, moderate, severe)    - MILD: No SOB at rest, mild SOB with walking, speaks normally in sentences, can lie down, no retractions, pulse < 100.    - MODERATE: SOB at rest, SOB with minimal exertion and prefers to sit, cannot lie down flat, speaks in phrases, mild retractions, audible wheezing, pulse 100-120.    - SEVERE: Very SOB at rest, speaks in single words, struggling to breathe, sitting hunched forward, retractions, pulse > 120      Moderate 5. RECURRENT SYMPTOM: "Have you had difficulty breathing before?" If Yes, ask: "When was the last time?" and "What happened that time?"      NO 6. CARDIAC HISTORY: "Do you have any history of heart disease?" (e.g., heart attack, angina, bypass  surgery, angioplasty)       7. LUNG HISTORY: "Do you have any history of lung disease?"  (e.g., pulmonary embolus, asthma, emphysema)      8. CAUSE: "What do you think is causing the breathing problem?"      Sinus 9. OTHER SYMPTOMS: "Do you have any other symptoms? (e.g., dizziness, runny nose, cough, chest pain, fever)     Stuffy nose, congested, mild cough, sinus pressure, headache frontal 10. O2 SATURATION MONITOR:  "Do you use an oxygen saturation monitor (pulse oximeter) at home?" If Yes, ask: "What is your reading (oxygen level) today?" "What is your usual oxygen saturation reading?" (e.g., 95%)       NA  Protocols used: Breathing Difficulty-A-AH

## 2022-12-05 NOTE — Telephone Encounter (Signed)
Noted  

## 2022-12-08 NOTE — Telephone Encounter (Signed)
Error

## 2022-12-15 ENCOUNTER — Encounter: Payer: Self-pay | Admitting: Radiology

## 2023-01-03 ENCOUNTER — Ambulatory Visit: Payer: Self-pay

## 2023-01-03 ENCOUNTER — Ambulatory Visit (HOSPITAL_BASED_OUTPATIENT_CLINIC_OR_DEPARTMENT_OTHER): Payer: Medicaid Other | Admitting: Student

## 2023-01-03 NOTE — Telephone Encounter (Signed)
  Chief Complaint: Right knee pain swelling Symptoms: above Frequency: months Pertinent Negatives: Patient denies Fever, chest pain, SOB Disposition: [] ED /[x] Urgent Care (no appt availability in office) / [] Appointment(In office/virtual)/ []  [facility]/ [] Home Care/ [] Refused Recommended Disposition /[]  [facility]/ []  Follow-up with PCP Additional Notes: Pt states that knee has been painful for months. Pt stands all day at there job and by the end of day her knee is very painful.  PT has tried a few home remedies and OTC medications w/o relief.  Pt unable to come into the office today. Pt will go to UC on Friday for care. Pt will try some other home remedies to help control pain until appt.  Summary: Knee Pain   Knee pain for a long while, patient wanted an appt for friday because she was off but there is nothing available until May.     Reason for Disposition  [1] Swollen joint AND [2] no fever or redness  Answer Assessment - Initial Assessment Questions 1. LOCATION and RADIATION: "Where is the pain located?"      Right knee 2. QUALITY: "What does the pain feel like?"  (e.g., sharp, dull, aching, burning)     Annoying pain 3. SEVERITY: "How bad is the pain?" "What does it keep you from doing?"   (Scale 1-10; or mild, moderate, severe)   -  MILD (1-3): doesn't interfere with normal activities    -  MODERATE (4-7): interferes with normal activities (e.g., work or school) or awakens from sleep, limping    -  SEVERE (8-10): excruciating pain, unable to do any normal activities, unable to walk     10/10 4. ONSET: "When did the pain start?" "Does it come and go, or is it there all the time?"     months 5. RECURRENT: "Have you had this pain before?" If Yes, ask: "When, and what happened then?"      6. SETTING: "Has there been any recent work, exercise or other activity that involved that part of the body?"      Stands at work 7. AGGRAVATING FACTORS: "What makes the  knee pain worse?" (e.g., walking, climbing stairs, running)     standing 8. ASSOCIATED SYMPTOMS: "Is there any swelling or redness of the knee?"     swelling 9. OTHER SYMPTOMS: "Do you have any other symptoms?" (e.g., chest pain, difficulty breathing, fever, calf pain)     no  Protocols used: Knee Pain-A-AH

## 2023-01-03 NOTE — Telephone Encounter (Signed)
Noted  

## 2023-01-06 ENCOUNTER — Ambulatory Visit: Payer: Medicaid Other

## 2023-01-18 ENCOUNTER — Ambulatory Visit: Payer: Medicaid Other | Admitting: Orthopaedic Surgery

## 2023-01-21 NOTE — Progress Notes (Unsigned)
Patient provided food pantry information.

## 2023-01-27 ENCOUNTER — Ambulatory Visit: Payer: Medicaid Other | Admitting: Physician Assistant

## 2023-01-27 ENCOUNTER — Encounter: Payer: Self-pay | Admitting: *Deleted

## 2023-01-27 NOTE — Progress Notes (Signed)
Pt attended 01/21/23 screening event where screening b/p was wnl. At event, pt identified food insecurities and was given food pantry resources. Pt also confirmed her PCP as Dr. Alvis Lemmings, and pt has been seen and/or contacted Dr. Baxter Flattery team for care on an ongoing basis over the past rolling 12 months.Pt has also been offered multiple orthopedic care opportunities. No additional health equity team support indicated at this time.

## 2023-04-14 ENCOUNTER — Telehealth: Payer: Self-pay

## 2023-04-14 ENCOUNTER — Other Ambulatory Visit: Payer: Self-pay

## 2023-04-14 ENCOUNTER — Ambulatory Visit: Payer: Self-pay

## 2023-04-14 MED ORDER — MISC. DEVICES MISC: 0 refills | Status: AC

## 2023-04-14 NOTE — Telephone Encounter (Signed)
Routing to PCP and patient is also requesting BP monitor for at home testing

## 2023-04-14 NOTE — Telephone Encounter (Signed)
Copied from CRM [id number]. Topic: General - Other >> Apr 14, 2023  8:57 AM Turkey B wrote: Reason for CRM: patient called in requesting a script for a humidifier

## 2023-04-14 NOTE — Telephone Encounter (Signed)
Ok to write prescription

## 2023-04-14 NOTE — Telephone Encounter (Signed)
Script has been written order will be faxed once signed by PCP

## 2023-04-14 NOTE — Telephone Encounter (Signed)
  Chief Complaint: HA 10/10 comes and goes - Pt thinks it might be d/t high BP Symptoms: HA Frequency: last week Pertinent Negatives: Patient denies injury Disposition: [] ED /[x] Urgent Care (no appt availability in office) / [] Appointment(In office/virtual)/ []  [place] [facility]/ [] Home Care/ [] Refused Recommended Disposition /[]  Mobile Bus/ []  Follow-up with PCP Additional Notes: Pt states that she has had a HA since last week that comes and goes. Tylenol has provided some relief. Pt thinks it may be d/t high BP. Pt has not readings. Pt would like a BP cuff ordered to monitor bp.No appts in office, pt will go to UC for care.    Reason for Disposition  [1] SEVERE headache (e.g., excruciating) AND [2] not improved after 2 hours of pain medicine  Answer Assessment - Initial Assessment Questions 1. LOCATION: "Where does it hurt?"      Top of head 2. ONSET: "When did the headache start?" (Minutes, hours or days)      Last week 3. PATTERN: "Does the pain come and go, or has it been constant since it started?"     Comes and goes 4. SEVERITY: "How bad is the pain?" and "What does it keep you from doing?"  (e.g., Scale 1-10; mild, moderate, or severe)   - MILD (1-3): doesn't interfere with normal activities    - MODERATE (4-7): interferes with normal activities or awakens from sleep    - SEVERE (8-10): excruciating pain, unable to do any normal activities        10/10 5. RECURRENT SYMPTOM: "Have you ever had headaches before?" If Yes, ask: "When was the last time?" and "What happened that time?"      yes 6. CAUSE: "What do you think is causing the headache?"     High bp? 7. MIGRAINE: "Have you been diagnosed with migraine headaches?" If Yes, ask: "Is this headache similar?"      Yes - unsure 8. HEAD INJURY: "Has there been any recent injury to the head?"      no  Protocols used: Headache-A-AH

## 2023-04-26 ENCOUNTER — Emergency Department (HOSPITAL_BASED_OUTPATIENT_CLINIC_OR_DEPARTMENT_OTHER): Payer: Medicaid Other | Admitting: Radiology

## 2023-04-26 ENCOUNTER — Telehealth: Payer: Self-pay | Admitting: Family Medicine

## 2023-04-26 ENCOUNTER — Emergency Department (HOSPITAL_BASED_OUTPATIENT_CLINIC_OR_DEPARTMENT_OTHER)
Admission: EM | Admit: 2023-04-26 | Discharge: 2023-04-27 | Disposition: A | Discharge status: Home / Self Care | Payer: Medicaid Other | Attending: Emergency Medicine | Admitting: Emergency Medicine

## 2023-04-26 ENCOUNTER — Encounter (HOSPITAL_BASED_OUTPATIENT_CLINIC_OR_DEPARTMENT_OTHER): Payer: Self-pay

## 2023-04-26 ENCOUNTER — Emergency Department (HOSPITAL_BASED_OUTPATIENT_CLINIC_OR_DEPARTMENT_OTHER): Payer: Medicaid Other

## 2023-04-26 DIAGNOSIS — R519 Headache, unspecified: Secondary | ICD-10-CM

## 2023-04-26 DIAGNOSIS — I10 Essential (primary) hypertension: Secondary | ICD-10-CM | POA: Diagnosis not present

## 2023-04-26 DIAGNOSIS — R079 Chest pain, unspecified: Secondary | ICD-10-CM

## 2023-04-26 DIAGNOSIS — Z79899 Other long term (current) drug therapy: Secondary | ICD-10-CM | POA: Insufficient documentation

## 2023-04-26 LAB — CBC WITH DIFFERENTIAL/PLATELET
Abs Immature Granulocytes: 0.01 10*3/uL (ref 0.00–0.07)
Basophils Absolute: 0 10*3/uL (ref 0.0–0.1)
Basophils Relative: 0 %
Eosinophils Absolute: 0.1 10*3/uL (ref 0.0–0.5)
Eosinophils Relative: 2 %
HCT: 39.3 % (ref 36.0–46.0)
Hemoglobin: 12.6 g/dL (ref 12.0–15.0)
Immature Granulocytes: 0 %
Lymphocytes Relative: 25 %
Lymphs Abs: 1.7 10*3/uL (ref 0.7–4.0)
MCH: 26.7 pg (ref 26.0–34.0)
MCHC: 32.1 g/dL (ref 30.0–36.0)
MCV: 83.3 fL (ref 80.0–100.0)
Monocytes Absolute: 0.5 10*3/uL (ref 0.1–1.0)
Monocytes Relative: 8 %
Neutro Abs: 4.4 10*3/uL (ref 1.7–7.7)
Neutrophils Relative %: 65 %
Platelets: 321 10*3/uL (ref 150–400)
RBC: 4.72 MIL/uL (ref 3.87–5.11)
RDW: 13.4 % (ref 11.5–15.5)
WBC: 6.8 10*3/uL (ref 4.0–10.5)
nRBC: 0 % (ref 0.0–0.2)

## 2023-04-26 LAB — COMPREHENSIVE METABOLIC PANEL
ALT: 70 U/L — ABNORMAL HIGH (ref 0–44)
AST: 31 U/L (ref 15–41)
Albumin: 4.1 g/dL (ref 3.5–5.0)
Alkaline Phosphatase: 100 U/L (ref 38–126)
Anion gap: 8 (ref 5–15)
BUN: 18 mg/dL (ref 6–20)
CO2: 27 mmol/L (ref 22–32)
Calcium: 10 mg/dL (ref 8.9–10.3)
Chloride: 109 mmol/L (ref 98–111)
Creatinine, Ser: 0.52 mg/dL (ref 0.44–1.00)
GFR, Estimated: >60 mL/min (ref >60)
Glucose, Bld: 103 mg/dL — ABNORMAL HIGH (ref 70–99)
Potassium: 2.9 mmol/L — ABNORMAL LOW (ref 3.5–5.1)
Sodium: 144 mmol/L (ref 135–145)
Total Bilirubin: 0.4 mg/dL (ref 0.3–1.2)
Total Protein: 6.6 g/dL (ref 6.5–8.1)

## 2023-04-26 LAB — TROPONIN I (HIGH SENSITIVITY): Troponin I (High Sensitivity): 4 ng/L (ref <18)

## 2023-04-26 LAB — MAGNESIUM: Magnesium: 1.8 mg/dL (ref 1.7–2.4)

## 2023-04-26 MED ORDER — POTASSIUM CHLORIDE 20 MEQ PO PACK
40.0000 meq | PACK | Freq: Once | ORAL | Status: AC
  Administered 2023-04-26: 40 meq via ORAL
  Filled 2023-04-26: qty 2

## 2023-04-26 MED ORDER — AMLODIPINE BESYLATE 5 MG PO TABS
2.5000 mg | ORAL_TABLET | Freq: Once | ORAL | Status: AC
  Administered 2023-04-26: 2.5 mg via ORAL
  Filled 2023-04-26: qty 1

## 2023-04-26 MED ORDER — POTASSIUM CHLORIDE 10 MEQ/100ML IV SOLN
10.0000 meq | Freq: Once | INTRAVENOUS | Status: AC
  Administered 2023-04-26: 10 meq via INTRAVENOUS
  Filled 2023-04-26: qty 100

## 2023-04-26 MED ORDER — DIPHENHYDRAMINE HCL 50 MG/ML IJ SOLN
25.0000 mg | Freq: Once | INTRAMUSCULAR | Status: AC
  Administered 2023-04-26: 25 mg via INTRAVENOUS
  Filled 2023-04-26: qty 1

## 2023-04-26 MED ORDER — POTASSIUM CHLORIDE CRYS ER 20 MEQ PO TBCR: 20.0000 meq | EXTENDED_RELEASE_TABLET | Freq: Two times a day (BID) | ORAL | 0 refills | Status: DC

## 2023-04-26 MED ORDER — AMLODIPINE BESYLATE 2.5 MG PO TABS: 2.5000 mg | ORAL_TABLET | Freq: Every day | ORAL | 0 refills | Status: DC

## 2023-04-26 MED ORDER — SODIUM CHLORIDE 0.9 % IV BOLUS
500.0000 mL | Freq: Once | INTRAVENOUS | Status: AC
  Administered 2023-04-26: 500 mL via INTRAVENOUS

## 2023-04-26 MED ORDER — METOCLOPRAMIDE HCL 5 MG/ML IJ SOLN
10.0000 mg | Freq: Once | INTRAMUSCULAR | Status: AC
  Administered 2023-04-26: 10 mg via INTRAVENOUS
  Filled 2023-04-26: qty 2

## 2023-04-26 NOTE — ED Triage Notes (Signed)
Pt c/o HTN, dizziness, HA. Denies blurred vision, additional symptoms. Endorses hx HTN, states that the doctor says she "doesn't need" BP meds- does not take any medications, "I don't know what's on that list."

## 2023-04-26 NOTE — ED Provider Notes (Signed)
[place] EMERGENCY DEPARTMENT AT [facility] Provider Note   CSN: [id number] Arrival date & time: 04/26/23  2038     History  Chief Complaint  Patient presents with   Hypertension   Dizziness    [name] is a 57 y.o. female, history of hypertension, who presents to the ED secondary to chest pain, and headache has been going on since she had elevated blood pressure today.  She states that she has had blood pressure that ranges as high as 147/105.  She is concerned because she has had a throbbing headache all day, and as well as some intermittent chest pain, that is sharp and stabbing in nature.  No shortness of breath, nausea or vomiting.  No weakness on one side of the body, or vision changes.   Home Medications Prior to Admission medications   Medication Sig Start Date End Date Taking? Authorizing Provider  amLODipine (NORVASC) 2.5 MG tablet Take 1 tablet (2.5 mg total) by mouth daily. 04/26/23  Yes Danthony Kendrix, Harley [place], PA  Misc. Devices MISC Blood Pressure Monitor 04/14/23   Hoy Register, MD  potassium chloride SA (KLOR-CON M) 20 MEQ tablet Take 1 tablet (20 mEq total) by mouth 2 (two) times daily for 4 days. 04/26/23 04/30/23 Yes Geanna Divirgilio L, PA  acetaminophen-codeine (TYLENOL #3) 300-30 MG tablet Take 1 tablet by mouth every 4 (four) hours as needed for moderate pain. Patient not taking: Reported on 10/19/2022 03/24/21   Kirtland Bouchard, PA-C  albuterol (VENTOLIN HFA) 108 (90 Base) MCG/ACT inhaler Inhale 2 puffs into the lungs every 6 (six) hours as needed for wheezing or shortness of breath. 10/28/21   Anders Simmonds, PA-C  atorvastatin (LIPITOR) 20 MG tablet Take 1 tablet (20 mg total) by mouth daily. 04/20/22   Hoy Register, MD  benzonatate (TESSALON) 100 MG capsule Take 1 capsule (100 mg total) by mouth 3 (three) times daily as needed for cough. Patient not taking: Reported on 10/19/2022 03/16/22   Waldon Merl, PA-C  Blood Pressure KIT Use as  instructed Dx: Hypertension 07/15/19   Hoy Register, MD  budesonide-formoterol (SYMBICORT) 160-4.5 MCG/ACT inhaler Inhale 2 puffs into the lungs 2 (two) times daily. 04/20/22   Hoy Register, MD  cetirizine (ZYRTEC) 10 MG tablet Take 1 tablet (10 mg total) by mouth daily. 04/20/22   Hoy Register, MD  diclofenac Sodium (VOLTAREN) 1 % GEL Apply 2 g topically 4 (four) times daily. 10/28/21   Anders Simmonds, PA-C  ipratropium (ATROVENT) 0.03 % nasal spray Place 2 sprays into both nostrils every 12 (twelve) hours. 04/20/22   Hoy Register, MD  metoprolol succinate (TOPROL-XL) 25 MG 24 hr tablet TAKE 1/2 TABLET(12.5 MG) BY MOUTH DAILY. 04/20/22   Hoy Register, MD  naproxen (NAPROSYN) 500 MG tablet Take 1 tablet (500 mg total) by mouth 2 (two) times daily with a meal. Patient not taking: Reported on 10/19/2022 01/05/22   Hoy Register, MD  ondansetron (ZOFRAN-ODT) 4 MG disintegrating tablet Take 1 tablet (4 mg total) by mouth every 6 (six) hours as needed for nausea or vomiting. Patient not taking: Reported on 10/19/2022 06/08/22   Merrilee Jansky, MD  potassium chloride (KLOR-CON) 10 MEQ tablet Take 1 tablet (10 mEq total) by mouth 2 (two) times daily. 05/05/22   Hoy Register, MD  promethazine (PHENERGAN) 25 MG tablet Take 1 tablet (25 mg total) by mouth every 8 (eight) hours as needed for nausea or vomiting. Patient not taking: Reported on 10/19/2022 06/08/22  Hoy Register, MD  SUMAtriptan (IMITREX) 50 MG tablet Take 1 tablet (50 mg total) by mouth every 2 (two) hours as needed for migraine. May repeat in 2 hours if headache persists or recurs. 10/19/22   Mayers, Cari S, PA-C  Vitamin D, Ergocalciferol, (DRISDOL) 1.25 MG (50000 UNIT) CAPS capsule Take 1 capsule (50,000 Units total) by mouth every 7 (seven) days. 10/20/22   Mayers, Cari S, PA-C  fluticasone (FLONASE) 50 MCG/ACT nasal spray Place 2 sprays into both nostrils daily. 07/15/19 04/01/20  Hoy Register, MD      Allergies     Tramadol    Review of Systems   Review of Systems  Constitutional:  Positive for fatigue.  Respiratory:  Negative for shortness of breath.   Cardiovascular:  Positive for chest pain.  Neurological:  Positive for dizziness.    Physical Exam Updated Vital Signs BP 136/74   Pulse 68   Temp 98 F (36.7 C)   Resp (!) 21   SpO2 93%  Physical Exam Vitals and nursing note reviewed.  Constitutional:      General: She is not in acute distress.    Appearance: She is well-developed.  HENT:     Head: Normocephalic and atraumatic.  Eyes:     Conjunctiva/sclera: Conjunctivae normal.  Cardiovascular:     Rate and Rhythm: Normal rate and regular rhythm.     Heart sounds: No murmur heard. Pulmonary:     Effort: Pulmonary effort is normal. No respiratory distress.     Breath sounds: Normal breath sounds.  Abdominal:     Palpations: Abdomen is soft.     Tenderness: There is no abdominal tenderness.  Musculoskeletal:        General: No swelling.     Cervical back: Neck supple.  Skin:    General: Skin is warm and dry.     Capillary Refill: Capillary refill takes less than 2 seconds.  Neurological:     Mental Status: She is alert.  Psychiatric:        Mood and Affect: Mood normal.     ED Results / Procedures / Treatments   Labs (all labs ordered are listed, but only abnormal results are displayed) Labs Reviewed  COMPREHENSIVE METABOLIC PANEL - Abnormal; Notable for the following components:      Result Value   Potassium 2.9 (*)    Glucose, Bld 103 (*)    ALT 70 (*)    All other components within normal limits  CBC WITH DIFFERENTIAL/PLATELET  MAGNESIUM  TROPONIN I (HIGH SENSITIVITY)    EKG None  Radiology CT Head Wo Contrast  Result Date: 04/26/2023 CLINICAL DATA:  HTN, dizziness, HA. Denies blurred vision, additional symptoms. Endorses hx HTN, states that the doctor says she "doesn't need" BP meds- does not take any medications, "I don't know what's on that list.  EXAM: CT HEAD WITHOUT CONTRAST TECHNIQUE: Contiguous axial images were obtained from the base of the skull through the vertex without intravenous contrast. RADIATION DOSE REDUCTION: This exam was performed according to the departmental dose-optimization program which includes automated exposure control, adjustment of the mA and/or kV according to patient size and/or use of iterative reconstruction technique. COMPARISON:  MRI head 01/15/2009, CT head 01/14/2009 FINDINGS: Brain: No evidence of large-territorial acute infarction. No parenchymal hemorrhage. No mass lesion. No extra-axial collection. No mass effect or midline shift. No hydrocephalus. Basilar cisterns are patent. Vascular: No hyperdense vessel. Skull: No acute fracture or focal lesion. Sinuses/Orbits: Paranasal sinuses and mastoid air cells  are clear. The orbits are unremarkable. Other: None. IMPRESSION: No acute intracranial abnormality. Electronically Signed   By: Tish Frederickson M.D.   On: 04/26/2023 23:10   DG Chest Port 1 View  Result Date: 04/26/2023 CLINICAL DATA:  Chest pain and shortness of breath EXAM: PORTABLE CHEST 1 VIEW COMPARISON:  Radiographs 01/03/2020 FINDINGS: Low lung volumes accentuate cardiomediastinal silhouette and vascularity. Bibasilar atelectasis. Otherwise no focal consolidation. No pleural effusion or pneumothorax. IMPRESSION: No active disease. Electronically Signed   By: Minerva Fester M.D.   On: 04/26/2023 23:07    Procedures Procedures    Medications Ordered in ED Medications  potassium chloride 10 mEq in 100 mL IVPB (10 mEq Intravenous New Bag/Given 04/26/23 2334)  amLODipine (NORVASC) tablet 2.5 mg (2.5 mg Oral Given 04/26/23 2240)  metoCLOPramide (REGLAN) injection 10 mg (10 mg Intravenous Given 04/26/23 2242)  diphenhydrAMINE (BENADRYL) injection 25 mg (25 mg Intravenous Given 04/26/23 2242)  sodium chloride 0.9 % bolus 500 mL (500 mLs Intravenous New Bag/Given 04/26/23 2242)  potassium chloride (KLOR-CON)  packet 40 mEq (40 mEq Oral Given 04/26/23 2330)    ED Course/ Medical Decision Making/ A&P             HEART Score: 3                Medical Decision Making Patient is a 57 year old female, here with a headache, chest pain, has been going on since she has noticed her blood pressure has been high for the last day.  Blood pressure has been 147/105, and has persistently been high, throughout the day.  States she has considerable fatigue.  She has no neurodeficits on exam, however the given new headache, elevated blood pressure will obtain a head CT for further evaluation.  Additionally change for chest x-ray troponins for further evaluation.  Reglan, Benadryl given for headache.  Amount and/or Complexity of Data Reviewed Labs: ordered.    Details: Potassium of 2.9, troponin within normal limits Radiology: ordered.    Details: Head CT unremarkable, unremarkable chest x-ray ECG/medicine tests: ordered.    Details: Normal sinus rhythm Discussion of management or test interpretation with external provider(s): Discussed with patient, she has low potassium 2.9, unknown source, we will replenish with 1 dose of IV potassium as well as oral potassium, discharged with oral potassium outpatient and instructed to follow-up with primary care doctor.  Her heart score is a 3, and chest pain improved with blood pressure management.  Headache also improved with blood pressure management management.  Head CT unremarkable, troponin within normal limits.  Will have her follow-up with her primary care doctor for further management, started her on low-dose amlodipine 2.5 mg, daily, return precautions emphasized.  Risk Prescription drug management.    Final Clinical Impression(s) / ED Diagnoses Final diagnoses:  Acute nonintractable headache, unspecified headache type  Hypertension, unspecified type  Chest pain, unspecified type    Rx / DC Orders ED Discharge Orders          Ordered    amLODipine (NORVASC)  2.5 MG tablet  Daily        04/26/23 2340    potassium chloride SA (KLOR-CON M) 20 MEQ tablet  2 times daily        04/26/23 2340              Corwyn Vora, Harley Alto, [place] 04/26/23 2345    Benjiman Core, MD 04/27/23 0008

## 2023-04-26 NOTE — ED Notes (Signed)
Patient transported to X-ray 

## 2023-04-26 NOTE — Telephone Encounter (Signed)
Pt called and informed that insurance will not cover humidifier, pt inquired about script for BP monitor. Pt informed that script was faxed on 04/14/2023 she states that she has not received, [facility] to [facility] and confirmed that fax was received and pt can come pick up monitor. Call placed to pt and informed her that she can pick up monitor from pharmacy.

## 2023-04-26 NOTE — ED Notes (Signed)
Pt states her BP has been high all day.  Pt is on no medications. Pt c/o HA last few days but had not checked her BP until today

## 2023-04-26 NOTE — Discharge Instructions (Addendum)
Please follow-up with your primary care doctor, shortness started you on a low-dose blood pressure medication for this, this may be causing your chest pain and headache.  Return to the ER if he has severe headache, chest pain, shortness of breath.  Your potassium was a little bit low today, thus I have sent you some potassium at to take outpatient, you should have your labs rechecked in about a week to make sure your potassium is within normal limits.

## 2023-04-26 NOTE — Telephone Encounter (Signed)
Copied from CRM [id number]. Topic: General - Other >> Apr 26, 2023 12:38 PM Jannifer Rodney M wrote: Reason for CRM: Pt called for an update on the Rx request for humidifier. Cb# [phone]

## 2023-06-05 ENCOUNTER — Encounter: Payer: Self-pay | Admitting: Physician Assistant

## 2023-06-05 ENCOUNTER — Ambulatory Visit: Payer: Medicaid Other | Admitting: Physician Assistant

## 2023-06-05 DIAGNOSIS — M1711 Unilateral primary osteoarthritis, right knee: Secondary | ICD-10-CM

## 2023-06-05 DIAGNOSIS — M1712 Unilateral primary osteoarthritis, left knee: Secondary | ICD-10-CM

## 2023-06-05 DIAGNOSIS — M170 Bilateral primary osteoarthritis of knee: Secondary | ICD-10-CM | POA: Diagnosis not present

## 2023-06-05 MED ORDER — METHYLPREDNISOLONE ACETATE 40 MG/ML IJ SUSP
40.0000 mg | INTRAMUSCULAR | Status: AC | PRN
Start: 2023-06-05 — End: 2023-06-05
  Administered 2023-06-05: 40 mg via INTRA_ARTICULAR

## 2023-06-05 MED ORDER — LIDOCAINE HCL 1 % IJ SOLN
3.0000 mL | INTRAMUSCULAR | Status: AC | PRN
Start: 2023-06-05 — End: 2023-06-05
  Administered 2023-06-05: 3 mL

## 2023-06-05 NOTE — Progress Notes (Signed)
   Procedure Note  Patient: [name]             Date of Birth: 09-11-1966           MRN: [id number]             Visit Date: 06/05/2023 HPI: The patient's 57 year old female well-known to Dr. Eliberto Ivory service.  She has chronic bilateral knee pain currently right worse than left.  She also has osteoarthritis both knees.  She comes in today requesting cortisone injections both knees.  She denies any new injury to either knee.  She is nondiabetic.  Denies any fevers chills ongoing infections.  Review of systems see HPI otherwise negative  Physical exam: General Well-developed well-nourished female no acute distress.  She ambulates without an assistive device. Bilateral knees: No abnormal warmth erythema or effusion.  Full range of motion.  Tenderness medial joint line of both knees.  Slight varus malalignment both knees.  Procedures: Visit Diagnoses:  1. Primary osteoarthritis of right knee   2. Primary osteoarthritis of left knee     Large Joint Inj: bilateral knee on 06/05/2023 3:49 PM Indications: pain Details: 22 G 1.5 in needle, anterolateral approach  Arthrogram: No  Medications (Right): 3 mL lidocaine 1 %; 40 mg methylPREDNISolone acetate 40 MG/ML Medications (Left): 3 mL lidocaine 1 %; 40 mg methylPREDNISolone acetate 40 MG/ML Outcome: tolerated well, no immediate complications Procedure, treatment alternatives, risks and benefits explained, specific risks discussed. Consent was given by the patient. Immediately prior to procedure a time out was called to verify the correct patient, procedure, equipment, support staff and site/side marked as required. Patient was prepped and draped in the usual sterile fashion.     Plan: She understands to wait at least 3 months between injections.  She did ask about knee brace today which he supplied a hinged knee brace.  Questions were encouraged and answered at length.

## 2023-06-20 ENCOUNTER — Encounter: Payer: Self-pay | Admitting: Family Medicine

## 2023-06-20 ENCOUNTER — Ambulatory Visit: Payer: Medicaid Other | Attending: Family Medicine | Admitting: Family Medicine

## 2023-06-20 VITALS — BP 127/84 | HR 70 | Temp 98.2°F | Ht 65.0 in | Wt 232.4 lb

## 2023-06-20 DIAGNOSIS — M25562 Pain in left knee: Secondary | ICD-10-CM

## 2023-06-20 DIAGNOSIS — J4530 Mild persistent asthma, uncomplicated: Secondary | ICD-10-CM

## 2023-06-20 DIAGNOSIS — Z23 Encounter for immunization: Secondary | ICD-10-CM

## 2023-06-20 DIAGNOSIS — I10 Essential (primary) hypertension: Secondary | ICD-10-CM

## 2023-06-20 DIAGNOSIS — N951 Menopausal and female climacteric states: Secondary | ICD-10-CM

## 2023-06-20 DIAGNOSIS — M25561 Pain in right knee: Secondary | ICD-10-CM

## 2023-06-20 DIAGNOSIS — Z1231 Encounter for screening mammogram for malignant neoplasm of breast: Secondary | ICD-10-CM

## 2023-06-20 DIAGNOSIS — R7303 Prediabetes: Secondary | ICD-10-CM

## 2023-06-20 DIAGNOSIS — Z9189 Other specified personal risk factors, not elsewhere classified: Secondary | ICD-10-CM

## 2023-06-20 DIAGNOSIS — E876 Hypokalemia: Secondary | ICD-10-CM

## 2023-06-20 MED ORDER — BUDESONIDE-FORMOTEROL FUMARATE 160-4.5 MCG/ACT IN AERO
2.0000 | INHALATION_SPRAY | Freq: Two times a day (BID) | RESPIRATORY_TRACT | 6 refills | Status: DC
Start: 2023-06-20 — End: 2023-12-12

## 2023-06-20 MED ORDER — ATORVASTATIN CALCIUM 20 MG PO TABS
20.0000 mg | ORAL_TABLET | Freq: Every day | ORAL | 1 refills | Status: DC
Start: 2023-06-20 — End: 2023-06-21

## 2023-06-20 MED ORDER — ALBUTEROL SULFATE HFA 108 (90 BASE) MCG/ACT IN AERS
2.0000 | INHALATION_SPRAY | Freq: Four times a day (QID) | RESPIRATORY_TRACT | 3 refills | Status: DC | PRN
Start: 2023-06-20 — End: 2023-12-12

## 2023-06-20 MED ORDER — VENLAFAXINE HCL ER 75 MG PO CP24: 75.0000 mg | ORAL_CAPSULE | Freq: Every day | ORAL | 1 refills | Status: DC

## 2023-06-20 MED ORDER — AMLODIPINE BESYLATE 2.5 MG PO TABS: 2.5000 mg | ORAL_TABLET | Freq: Every day | ORAL | 1 refills | Status: DC

## 2023-06-20 MED ORDER — ALBUTEROL SULFATE (2.5 MG/3ML) 0.083% IN NEBU: 2.5000 mg | INHALATION_SOLUTION | Freq: Four times a day (QID) | RESPIRATORY_TRACT | 1 refills | Status: DC | PRN

## 2023-06-20 MED ORDER — MISC. DEVICES MISC: 0 refills | Status: AC

## 2023-06-20 NOTE — Patient Instructions (Signed)
 Managing Hot Flashes During Menopause You will learn what hot flashes/night sweats are, what causes them and what the treatment methods are. To view the content, go to this web address: [email or web address]  This video will expire on: 04/02/2025. If you need access to this video following this date, please reach out to the healthcare provider who assigned it to you. This information is not intended to replace advice given to you by your health care provider. Make sure you discuss any questions you have with your health care provider. Elsevier Patient Education  2024 [employer].

## 2023-06-20 NOTE — Progress Notes (Signed)
Subjective:  Patient ID: [name], female    DOB: 1966-04-04  Age: 57 y.o. MRN: [id number]  CC: Medical Management of Chronic Issues (Discuss hot flashes)   HPI [name] is a 57 y.o. year old female with a history of Hypertension, Asthma, GERD, Depression and low back pain    Interval History: Discussed the use of AI scribe software for clinical note transcription with the patient, who gave verbal consent to proceed.   The patient presents with hot flashes, mood changes, and asthma. She reports experiencing hot flashes 'all the time,' which are associated with mood changes. She has not been on her asthma medication, Symbicort for a while and has noticed an increase in wheezing. She also expresses a need for a nebulizer machine.  The patient has been off her potassium supplement for a while, which was previously prescribed due to low levels.  Last potassium was 2.9 two months ago.  She is unsure about her current blood pressure medication regimen, but she is taking at least one medication for hypertension. She also reports knee pain, which is exacerbated by standing on a cement floor at work.        Past Medical History:  Diagnosis Date   Asthma    Gastric reflux    History of blood transfusion yrs agio, none recent   Hypertension    Osteoarthritis    Sleep apnea    cpap machine has not used since 2014 due to machine/tubing in bad shape    Syncope and collapse yrs ago    Past Surgical History:  Procedure Laterality Date   CARDIAC CATHETERIZATION     more than 5 years ago   CARDIAC CATHETERIZATION  06-07-2012   [facility]: Normal coronary arteries. No significant LVOT/aortic valve gradient   CHOLECYSTECTOMY     COLONOSCOPY WITH PROPOFOL N/A 02/27/2014   Procedure: COLONOSCOPY WITH PROPOFOL;  Surgeon: Charolett Bumpers, MD;  Location: [facility];  Service: Endoscopy;  Laterality: N/A;   ESOPHAGOGASTRODUODENOSCOPY (EGD) WITH PROPOFOL N/A 02/27/2014   Procedure:  ESOPHAGOGASTRODUODENOSCOPY (EGD) WITH PROPOFOL;  Surgeon: Charolett Bumpers, MD;  Location: [facility];  Service: Endoscopy;  Laterality: N/A;   EXPLORATORY LAPAROTOMY  09/2001   ELAP, LOA, R partial oophorectomy, repair of bowel injury   VAGINAL HYSTERECTOMY  2001   Adnexa left in place    Family History  Family history unknown: Yes    Social History   Socioeconomic History   Marital status: Single    Spouse name: Not on file   Number of children: Not on file   Years of education: Not on file   Highest education level: Not on file  Occupational History   Not on file  Tobacco Use   Smoking status: Never   Smokeless tobacco: Never  Vaping Use   Vaping status: Never Used  Substance and Sexual Activity   Alcohol use: No   Drug use: No   Sexual activity: Never    Birth control/protection: Surgical  Other Topics Concern   Not on file  Social History Narrative   Not on file   Social Determinants of Health   Financial Resource Strain: Not on file  Food Insecurity: Food Insecurity Present (01/21/2023)   Hunger Vital Sign    Worried About Running Out of Food in the Last Year: Often true    Ran Out of Food in the Last Year: Sometimes true  Transportation Needs: No Transportation Needs (01/21/2023)   PRAPARE - Transportation    Lack  of Transportation (Medical): No    Lack of Transportation (Non-Medical): No  Physical Activity: Inactive (01/02/2022)   Exercise Vital Sign    Days of Exercise per Week: 0 days    Minutes of Exercise per Session: 0 min  Stress: Not on file  Social Connections: Unknown (09/05/2022)   Received from [facility], [facility]   Social Network    Social Network: Not on file    Allergies  Allergen Reactions   Tramadol Hives, Itching and Swelling    Pt states after medication intake she had to go to the ER.     Outpatient Medications Prior to Visit  Medication Sig Dispense Refill   acetaminophen-codeine (TYLENOL #3) 300-30 MG tablet Take 1  tablet by mouth every 4 (four) hours as needed for moderate pain. 30 tablet 0   Blood Pressure KIT Use as instructed Dx: Hypertension 1 kit 0   cetirizine (ZYRTEC) 10 MG tablet Take 1 tablet (10 mg total) by mouth daily. 30 tablet 2   diclofenac Sodium (VOLTAREN) 1 % GEL Apply 2 g topically 4 (four) times daily. 100 g 3   ipratropium (ATROVENT) 0.03 % nasal spray Place 2 sprays into both nostrils every 12 (twelve) hours. 30 mL 0   Misc. Devices MISC Blood Pressure Monitor 1 each 0   naproxen (NAPROSYN) 500 MG tablet Take 1 tablet (500 mg total) by mouth 2 (two) times daily with a meal. 60 tablet 3   ondansetron (ZOFRAN-ODT) 4 MG disintegrating tablet Take 1 tablet (4 mg total) by mouth every 6 (six) hours as needed for nausea or vomiting. 15 tablet 0   potassium chloride (KLOR-CON) 10 MEQ tablet Take 1 tablet (10 mEq total) by mouth 2 (two) times daily. 30 tablet 3   SUMAtriptan (IMITREX) 50 MG tablet Take 1 tablet (50 mg total) by mouth every 2 (two) hours as needed for migraine. May repeat in 2 hours if headache persists or recurs. 10 tablet 0   Vitamin D, Ergocalciferol, (DRISDOL) 1.25 MG (50000 UNIT) CAPS capsule Take 1 capsule (50,000 Units total) by mouth every 7 (seven) days. 4 capsule 2   albuterol (VENTOLIN HFA) 108 (90 Base) MCG/ACT inhaler Inhale 2 puffs into the lungs every 6 (six) hours as needed for wheezing or shortness of breath. 18 g 3   amLODipine (NORVASC) 2.5 MG tablet Take 1 tablet (2.5 mg total) by mouth daily. 30 tablet 0   atorvastatin (LIPITOR) 20 MG tablet Take 1 tablet (20 mg total) by mouth daily. 30 tablet 6   budesonide-formoterol (SYMBICORT) 160-4.5 MCG/ACT inhaler Inhale 2 puffs into the lungs 2 (two) times daily. 10.2 g 6   metoprolol succinate (TOPROL-XL) 25 MG 24 hr tablet TAKE 1/2 TABLET(12.5 MG) BY MOUTH DAILY. 15 tablet 6   benzonatate (TESSALON) 100 MG capsule Take 1 capsule (100 mg total) by mouth 3 (three) times daily as needed for cough. (Patient not taking:  Reported on 10/19/2022) 30 capsule 0   potassium chloride SA (KLOR-CON M) 20 MEQ tablet Take 1 tablet (20 mEq total) by mouth 2 (two) times daily for 4 days. 16 tablet 0   promethazine (PHENERGAN) 25 MG tablet Take 1 tablet (25 mg total) by mouth every 8 (eight) hours as needed for nausea or vomiting. (Patient not taking: Reported on 10/19/2022) 20 tablet 0   No facility-administered medications prior to visit.     ROS Review of Systems  Constitutional:  Negative for activity change and appetite change.  HENT:  Negative for sinus  pressure and sore throat.   Respiratory:  Negative for chest tightness, shortness of breath and wheezing.   Cardiovascular:  Negative for chest pain and palpitations.  Gastrointestinal:  Negative for abdominal distention, abdominal pain and constipation.  Genitourinary: Negative.   Musculoskeletal:        See HPI  Psychiatric/Behavioral:  Negative for behavioral problems and dysphoric mood.     Objective:  BP 127/84   Pulse 70   Temp 98.2 F (36.8 C) (Oral)   Ht 5\' 5"  (1.651 m)   Wt 232 lb 6.4 oz (105.4 kg)   SpO2 98%   BMI 38.67 kg/m      06/20/2023    8:43 AM 04/27/2023   12:30 AM 04/27/2023   12:04 AM  BP/Weight  Systolic BP 127 130 142  Diastolic BP 84 83 98  Wt. (Lbs) 232.4    BMI 38.67 kg/m2        Physical Exam Constitutional:      Appearance: She is well-developed.  Cardiovascular:     Rate and Rhythm: Normal rate.     Heart sounds: Murmur heard.  Pulmonary:     Effort: Pulmonary effort is normal.     Breath sounds: Normal breath sounds. No wheezing or rales.  Chest:     Chest wall: No tenderness.  Abdominal:     General: Bowel sounds are normal. There is no distension.     Palpations: Abdomen is soft. There is no mass.     Tenderness: There is no abdominal tenderness.  Musculoskeletal:        General: Normal range of motion.     Right lower leg: No edema.     Left lower leg: No edema.  Neurological:     Mental Status: She  is alert and oriented to person, place, and time.  Psychiatric:        Mood and Affect: Mood normal.        Latest Ref Rng & Units 04/26/2023   10:32 PM 10/19/2022    9:24 AM 04/20/2022    9:15 AM  CMP  Glucose 70 - 99 mg/dL 782  87  956   BUN 6 - 20 mg/dL 18  13  11    Creatinine 0.44 - 1.00 mg/dL 2.13  0.86  5.78   Sodium 135 - 145 mmol/L 144  144  147   Potassium 3.5 - 5.1 mmol/L 2.9  3.6  3.2   Chloride 98 - 111 mmol/L 109  106  109   CO2 22 - 32 mmol/L 27  23  24    Calcium 8.9 - 10.3 mg/dL 46.9  9.8  9.6   Total Protein 6.5 - 8.1 g/dL 6.6     Total Bilirubin 0.3 - 1.2 mg/dL 0.4     Alkaline Phos 38 - 126 U/L 100     AST 15 - 41 U/L 31     ALT 0 - 44 U/L 70       Lipid Panel     Component Value Date/Time   CHOL 164 10/28/2021 0856   CHOL 167 03/18/2012 0522   TRIG 88 10/28/2021 0856   TRIG 58 03/18/2012 0522   HDL 45 10/28/2021 0856   HDL 60 03/18/2012 0522   CHOLHDL 3.6 10/28/2021 0856   CHOLHDL 3.4 05/04/2016 1004   VLDL 18 05/04/2016 1004   VLDL 12 03/18/2012 0522   LDLCALC 103 (H) 10/28/2021 0856   LDLCALC 95 03/18/2012 0522    CBC    Component  Value Date/Time   WBC 6.8 04/26/2023 2232   RBC 4.72 04/26/2023 2232   HGB 12.6 04/26/2023 2232   HGB 13.7 10/19/2022 0924   HCT 39.3 04/26/2023 2232   HCT 41.4 10/19/2022 0924   PLT 321 04/26/2023 2232   PLT 317 10/19/2022 0924   MCV 83.3 04/26/2023 2232   MCV 84 10/19/2022 0924   MCV 81 05/19/2013 2105   MCH 26.7 04/26/2023 2232   MCHC 32.1 04/26/2023 2232   RDW 13.4 04/26/2023 2232   RDW 12.8 10/19/2022 0924   RDW 13.8 05/19/2013 2105   LYMPHSABS 1.7 04/26/2023 2232   LYMPHSABS 1.7 10/19/2022 0924   LYMPHSABS 1.4 03/17/2012 0549   MONOABS 0.5 04/26/2023 2232   MONOABS 0.3 03/17/2012 0549   EOSABS 0.1 04/26/2023 2232   EOSABS 0.1 10/19/2022 0924   EOSABS 0.1 03/17/2012 0549   BASOSABS 0.0 04/26/2023 2232   BASOSABS 0.0 10/19/2022 0924   BASOSABS 0.0 03/17/2012 0549    Lab Results  Component  Value Date   HGBA1C 5.9 (H) 10/28/2021    Assessment & Plan:      Menopausal Symptoms Persistent hot flashes and mood changes. Discussed the risks/benefits of hormonal vs non-hormonal treatment. Patient opted for non-hormonal treatment. -Start Effexor for hot flashes and mood changes.   Asthma Increased wheezing, currently off Symbicort and Albuterol. Expressed interest in nebulizer machine. -Resume Symbicort and Albuterol. -Order nebulizer machine and solution from medical equipment company.  Hypertension Unclear medication regimen. Patient reports taking one medication, but two are listed in the chart (Amlodipine and Metoprolol). Blood pressure is currently controlled. -Clarify medication regimen with pharmacy.  Metoprolol was last filled last year. -Continue Amlodipine 2.5mg  daily. -Counseled on blood pressure goal of less than 130/80, low-sodium, DASH diet, medication compliance, 150 minutes of moderate intensity exercise per week. Discussed medication compliance, adverse effects.   Hypokalemia Patient has been off potassium supplement for an unspecified period. Previous lab results showed low potassium. -Will send a potassium level and refill potassium chloride based on this  Hyperlipidemia/increased risk for cardiovascular disease Patient is not currently taking Atorvastatin as prescribed. -Encourage adherence to Atorvastatin for cholesterol management.  Knee Pain Patient reports knee pain, possibly related to standing for long periods at work. Currently using a knee brace. -Recommend supportive shoes and use of a mat at work if possible.  Prediabetes A1c 5.9 -Will check A1c today  General Health Maintenance -Order mammogram. -Administer flu shot today. -Schedule follow-up visit in six months.          Meds ordered this encounter  Medications   budesonide-formoterol (SYMBICORT) 160-4.5 MCG/ACT inhaler    Sig: Inhale 2 puffs into the lungs 2 (two) times  daily.    Dispense:  10.2 g    Refill:  6   venlafaxine XR (EFFEXOR XR) 75 MG 24 hr capsule    Sig: Take 1 capsule (75 mg total) by mouth daily with breakfast.    Dispense:  90 capsule    Refill:  1   albuterol (VENTOLIN HFA) 108 (90 Base) MCG/ACT inhaler    Sig: Inhale 2 puffs into the lungs every 6 (six) hours as needed for wheezing or shortness of breath.    Dispense:  18 g    Refill:  3    **Patient requests 90 days supply**   albuterol (PROVENTIL) (2.5 MG/3ML) 0.083% nebulizer solution    Sig: Take 3 mLs (2.5 mg total) by nebulization every 6 (six) hours as needed for wheezing or shortness of breath.  Dispense:  150 mL    Refill:  1   Misc. Devices MISC    Sig: Nebulizer device. Diagnosis - Asthma    Dispense:  1 each    Refill:  0   amLODipine (NORVASC) 2.5 MG tablet    Sig: Take 1 tablet (2.5 mg total) by mouth daily.    Dispense:  90 tablet    Refill:  1   atorvastatin (LIPITOR) 20 MG tablet    Sig: Take 1 tablet (20 mg total) by mouth daily.    Dispense:  90 tablet    Refill:  1    Follow-up: Return in about 6 months (around 12/18/2023) for Chronic medical conditions.       Hoy Register, MD, FAAFP. [facility] [place], [place] [phone]   06/20/2023, 10:14 AM

## 2023-06-21 ENCOUNTER — Other Ambulatory Visit: Payer: Self-pay

## 2023-06-21 ENCOUNTER — Other Ambulatory Visit: Payer: Self-pay | Admitting: Family Medicine

## 2023-06-21 DIAGNOSIS — Z9189 Other specified personal risk factors, not elsewhere classified: Secondary | ICD-10-CM

## 2023-06-21 LAB — LP+NON-HDL CHOLESTEROL
Cholesterol, Total: 200 mg/dL — ABNORMAL HIGH (ref 100–199)
HDL: 56 mg/dL (ref >39)
LDL Chol Calc (NIH): 127 mg/dL — ABNORMAL HIGH (ref 0–99)
Total Non-HDL-Chol (LDL+VLDL): 144 mg/dL — ABNORMAL HIGH (ref 0–129)
Triglycerides: 93 mg/dL (ref 0–149)
VLDL Cholesterol Cal: 17 mg/dL (ref 5–40)

## 2023-06-21 LAB — HEMOGLOBIN A1C
Est. average glucose Bld gHb Est-mCnc: 123 mg/dL
Hgb A1c MFr Bld: 5.9 % — ABNORMAL HIGH (ref 4.8–5.6)

## 2023-06-21 LAB — POTASSIUM: Potassium: 3.3 mmol/L — ABNORMAL LOW (ref 3.5–5.2)

## 2023-06-21 MED ORDER — POTASSIUM CHLORIDE CRYS ER 20 MEQ PO TBCR
20.0000 meq | EXTENDED_RELEASE_TABLET | Freq: Every day | ORAL | 1 refills | Status: DC
  Filled 2023-06-21: qty 90, 90d supply, fill #0

## 2023-06-21 MED ORDER — ATORVASTATIN CALCIUM 40 MG PO TABS
40.0000 mg | ORAL_TABLET | Freq: Every day | ORAL | 1 refills | Status: DC
  Filled 2023-06-21: qty 90, 90d supply, fill #0

## 2023-06-22 ENCOUNTER — Other Ambulatory Visit: Payer: Self-pay

## 2023-06-27 ENCOUNTER — Other Ambulatory Visit: Payer: Self-pay

## 2023-07-27 ENCOUNTER — Other Ambulatory Visit: Payer: Self-pay | Admitting: Family Medicine

## 2023-07-27 DIAGNOSIS — N6489 Other specified disorders of breast: Secondary | ICD-10-CM

## 2023-08-01 ENCOUNTER — Ambulatory Visit: Payer: Medicaid Other

## 2023-08-08 ENCOUNTER — Ambulatory Visit
Admission: RE | Admit: 2023-08-08 | Discharge: 2023-08-08 | Disposition: A | Discharge status: Home / Self Care | Payer: Medicaid Other | Source: Ambulatory Visit | Attending: Family Medicine | Admitting: Family Medicine

## 2023-08-08 ENCOUNTER — Other Ambulatory Visit: Payer: Self-pay | Admitting: Family Medicine

## 2023-08-08 DIAGNOSIS — N6320 Unspecified lump in the left breast, unspecified quadrant: Secondary | ICD-10-CM

## 2023-08-08 DIAGNOSIS — N6489 Other specified disorders of breast: Secondary | ICD-10-CM

## 2023-08-24 ENCOUNTER — Ambulatory Visit: Payer: Self-pay

## 2023-08-24 NOTE — Telephone Encounter (Signed)
Scheduled patient at [facility], 08/25/2023 to address fall and knee pain. Patient aware of location and reason for OV.

## 2023-08-24 NOTE — Telephone Encounter (Signed)
    Chief Complaint: Pt. Larey Seat yesterday going to work. "My knee gave out and off balance." Fell on right knee. Has swelling to knee today and lower back pain. Asking to be worked in today. Symptoms: Above Frequency: Yesterday Pertinent Negatives: Patient denies any other injury. Disposition: [] ED /[] Urgent Care (no appt availability in office) / [x] Appointment(In office/virtual)/ []  [facility]/ [] Home Care/ [] Refused Recommended Disposition /[]  Mobile Bus/ []  Follow-up with PCP Additional Notes: Please advise pt.  Reason for Disposition  [1] MODERATE weakness (i.e., interferes with work, school, normal activities) AND [2] new-onset or worsening  Answer Assessment - Initial Assessment Questions 1. MECHANISM: "How did the fall happen?"     Lost balance with right knee 2. DOMESTIC VIOLENCE AND ELDER ABUSE SCREENING: "Did you fall because someone pushed you or tried to hurt you?" If Yes, ask: "Are you safe now?"     N/a 3. ONSET: "When did the fall happen?" (e.g., minutes, hours, or days ago)     Yesterday 4. LOCATION: "What part of the body hit the ground?" (e.g., back, buttocks, head, hips, knees, hands, head, stomach)     Right knee 5. INJURY: "Did you hurt (injure) yourself when you fell?" If Yes, ask: "What did you injure? Tell me more about this?" (e.g., body area; type of injury; pain severity)"     Yes, knee and back hurts today 6. PAIN: "Is there any pain?" If Yes, ask: "How bad is the pain?" (e.g., Scale 1-10; or mild,  moderate, severe)   - NONE (0): No pain   - MILD (1-3): Doesn't interfere with normal activities    - MODERATE (4-7): Interferes with normal activities or awakens from sleep    - SEVERE (8-10): Excruciating pain, unable to do any normal activities      10 7. SIZE: For cuts, bruises, or swelling, ask: "How large is it?" (e.g., inches or centimeters)      Scrape to knee 8. PREGNANCY: "Is there any chance you are pregnant?" "When was your  last menstrual period?"     N/a 9. OTHER SYMPTOMS: "Do you have any other symptoms?" (e.g., dizziness, fever, weakness; new onset or worsening).      Weak, dizzy 10. CAUSE: "What do you think caused the fall (or falling)?" (e.g., tripped, dizzy spell)       Lost balance  Protocols used: Falls and [facility]

## 2023-08-25 ENCOUNTER — Encounter: Payer: Self-pay | Admitting: Physician Assistant

## 2023-08-25 ENCOUNTER — Encounter: Payer: Self-pay | Admitting: Family

## 2023-08-25 ENCOUNTER — Other Ambulatory Visit (INDEPENDENT_AMBULATORY_CARE_PROVIDER_SITE_OTHER): Payer: Self-pay

## 2023-08-25 ENCOUNTER — Ambulatory Visit (INDEPENDENT_AMBULATORY_CARE_PROVIDER_SITE_OTHER): Payer: Medicaid Other | Admitting: Physician Assistant

## 2023-08-25 DIAGNOSIS — M25561 Pain in right knee: Secondary | ICD-10-CM

## 2023-08-25 NOTE — Telephone Encounter (Signed)
Noted  

## 2023-08-25 NOTE — Progress Notes (Signed)
Erroneous encounter-disregard

## 2023-08-25 NOTE — Progress Notes (Signed)
Office Visit Note   Patient: [name]           Date of Birth: 09-Jun-1966           MRN: [id number] Visit Date: 08/25/2023              Requested by: Hoy Register, MD [address] [place] [address] [place],  [place] [address] PCP: Hoy Register, MD  Chief Complaint  Patient presents with   Right Knee - Pain      HPI: Neharika is a pleasant 57 year old woman who is a patient of Dr. Eliberto Ivory.  She is periodically seen for the osteoarthritis of her bilateral knees.  She was last injected in August.  2 weeks ago she came down onto the front of her knee on the right.  This was while she was taking out the trash.  She has had swelling and pain but more the last couple nights.  Says she has not been able to sleep.  She has been taking ice and Tylenol and Advil does not think this is helping very much.  Assessment & Plan: Visit Diagnoses:  1. Acute pain of right knee     Plan: Examination of her knee shows most of her tenderness over the patellofemoral joint.  She does not have an effusion her knee is not warm.  By x-ray she has had advancement of her osteoarthritis.  She may have a small avulsion fracture off the tibial spine.  We talked about Voltaren gel.  I have also offered her a steroid injection today.  She is a week or so early but I think this would be perfectly fine as she is not have a high A1c.  She is declined this at this time would have her follow-up for reevaluation with Kriste Basque in a couple weeks  Follow-Up Instructions: No follow-ups on file.   Ortho Exam  Patient is alert, oriented, no adenopathy, well-dressed, normal affect, normal respiratory effort. Left knee no effusion no erythema no warmth.  Compartments are soft and nontender.  She has some focal tone pain more over the patellofemoral joint.  She is able to sustain a drip straight leg raise and can resist flexion and extension though somewhat limited by pain she has no ecchymosis negative Homans'  sign  Imaging: XR KNEE 3 VIEW RIGHT  Result Date: 08/25/2023 Three-view radiographs of the right knee were reviewed today.  She has progressive tricompartmental arthritis progressed from previous x-rays 2 years ago.  Cannot appreciate any acute fractures of the tibial plateau and the distal femur.  She does have a small ossification off the tibial eminence which could represent a fracture of an avulsion.  This was not seen on previous films.  No images are attached to the encounter.  Labs: Lab Results  Component Value Date   HGBA1C 5.9 (H) 06/20/2023   HGBA1C 5.9 (H) 10/28/2021   HGBA1C 5.8 12/13/2018   REPTSTATUS 03/03/2014 FINAL 02/28/2014   CULT  02/28/2014    ESCHERICHIA COLI Performed at [facility]   LABORGA NO GROWTH 10/29/2015     Lab Results  Component Value Date   ALBUMIN 4.1 04/26/2023   ALBUMIN 4.2 10/28/2021   ALBUMIN 4.1 07/15/2019    Lab Results  Component Value Date   MG 1.8 04/26/2023   MG 1.8 03/18/2012   Lab Results  Component Value Date   VD25OH 20.7 (L) 10/19/2022   VD25OH 12.8 (L) 04/20/2022   VD25OH 15.5 (L) 07/15/2019  No results found for: "PREALBUMIN"    Latest Ref Rng & Units 04/26/2023   10:32 PM 10/19/2022    9:24 AM 10/28/2021    8:56 AM  CBC EXTENDED  WBC 4.0 - 10.5 K/uL 6.8  5.6  5.5   RBC 3.87 - 5.11 MIL/uL 4.72  4.96  4.69   Hemoglobin 12.0 - 15.0 g/dL 16.1  09.6  04.5   HCT 36.0 - 46.0 % 39.3  41.4  38.4   Platelets 150 - 400 K/uL 321  317  297   NEUT# 1.7 - 7.7 K/uL 4.4  3.3  2.7   Lymph# 0.7 - 4.0 K/uL 1.7  1.7  2.0      There is no height or weight on file to calculate BMI.  Orders:  Orders Placed This Encounter  Procedures   XR KNEE 3 VIEW RIGHT   No orders of the defined types were placed in this encounter.    Procedures: No procedures performed  Clinical Data: No additional findings.  ROS:  All other systems negative, except as noted in the HPI. Review of Systems  Objective: Vital Signs:  There were no vitals taken for this visit.  Specialty Comments:  No specialty comments available.  PMFS History: Patient Active Problem List   Diagnosis Date Noted   Vitamin D deficiency 10/20/2022   Suspected sleep apnea 06/28/2022   Primary osteoarthritis of right knee 07/01/2021   Primary osteoarthritis of left knee 07/01/2021   Developmental delay 09/17/2020   Pre-diabetes 07/07/2020   Dyspnea on exertion 07/07/2020   Abnormal ECG 07/07/2020   Obesity 01/29/2018   Acute left-sided low back pain with left-sided sciatica 11/27/2017   Low back pain 10/30/2017   Itching with irritation 06/23/2016   Gastric reflux 05/04/2016   Asthma 05/04/2016   Essential hypertension 07/23/2015   Mild depression 07/23/2015   Chest pain 06/06/2012   Murmur, cardiac 06/06/2012   Past Medical History:  Diagnosis Date   Asthma    Gastric reflux    History of blood transfusion yrs agio, none recent   Hypertension    Osteoarthritis    Sleep apnea    cpap machine has not used since 2014 due to machine/tubing in bad shape    Syncope and collapse yrs ago    Family History  Family history unknown: Yes    Past Surgical History:  Procedure Laterality Date   CARDIAC CATHETERIZATION     more than 5 years ago   CARDIAC CATHETERIZATION  06-07-2012   [facility]: Normal coronary arteries. No significant LVOT/aortic valve gradient   CHOLECYSTECTOMY     COLONOSCOPY WITH PROPOFOL N/A 02/27/2014   Procedure: COLONOSCOPY WITH PROPOFOL;  Surgeon: Charolett Bumpers, MD;  Location: [facility];  Service: Endoscopy;  Laterality: N/A;   ESOPHAGOGASTRODUODENOSCOPY (EGD) WITH PROPOFOL N/A 02/27/2014   Procedure: ESOPHAGOGASTRODUODENOSCOPY (EGD) WITH PROPOFOL;  Surgeon: Charolett Bumpers, MD;  Location: [facility];  Service: Endoscopy;  Laterality: N/A;   EXPLORATORY LAPAROTOMY  09/2001   ELAP, LOA, R partial oophorectomy, repair of bowel injury   VAGINAL HYSTERECTOMY  2001   Adnexa left in place   Social History    Occupational History   Not on file  Tobacco Use   Smoking status: Never   Smokeless tobacco: Never  Vaping Use   Vaping status: Never Used  Substance and Sexual Activity   Alcohol use: No   Drug use: No   Sexual activity: Never    Birth control/protection: Surgical

## 2023-09-13 ENCOUNTER — Encounter (INDEPENDENT_AMBULATORY_CARE_PROVIDER_SITE_OTHER): Payer: Medicaid Other | Admitting: Family

## 2023-09-13 NOTE — Progress Notes (Signed)
Erroneous encounter-disregard

## 2023-11-09 ENCOUNTER — Ambulatory Visit: Payer: Self-pay | Admitting: Family Medicine

## 2023-11-09 ENCOUNTER — Encounter: Payer: Self-pay | Admitting: Internal Medicine

## 2023-11-09 ENCOUNTER — Ambulatory Visit: Payer: Medicaid Other | Admitting: Pharmacist

## 2023-11-09 ENCOUNTER — Encounter: Payer: Self-pay | Admitting: Family Medicine

## 2023-11-09 ENCOUNTER — Ambulatory Visit: Payer: Medicaid Other | Admitting: Internal Medicine

## 2023-11-09 VITALS — BP 144/100 | HR 77 | Temp 98.0°F | Ht 65.0 in | Wt 233.0 lb

## 2023-11-09 DIAGNOSIS — M1711 Unilateral primary osteoarthritis, right knee: Secondary | ICD-10-CM

## 2023-11-09 MED ORDER — MELOXICAM 15 MG PO TABS: 15.0000 mg | ORAL_TABLET | Freq: Every day | ORAL | 0 refills | Status: DC

## 2023-11-09 NOTE — Patient Instructions (Signed)
We have sent in meloxicam to take 1 pill daily for the next few weeks.   I suspect that your knee may need to be replaced at some point in the near future.

## 2023-11-09 NOTE — Progress Notes (Signed)
   Subjective:   Patient ID: [name], female    DOB: 09-Sep-1966, 58 y.o.   MRN: [id number]  HPI The patient is a 58 YO female coming in for right knee pain progressive. No injury to the knee. Swollen some more in the last day or two. Feels less stable has not given way on her.   Review of Systems  Constitutional: Negative.   HENT: Negative.    Eyes: Negative.   Respiratory:  Negative for cough, chest tightness and shortness of breath.   Cardiovascular:  Negative for chest pain, palpitations and leg swelling.  Gastrointestinal:  Negative for abdominal distention, abdominal pain, constipation, diarrhea, nausea and vomiting.  Musculoskeletal:  Positive for arthralgias and myalgias.  Skin: Negative.   Neurological: Negative.   Psychiatric/Behavioral: Negative.      Objective:  Physical Exam Constitutional:      Appearance: She is well-developed.  HENT:     Head: Normocephalic and atraumatic.  Cardiovascular:     Rate and Rhythm: Normal rate and regular rhythm.  Pulmonary:     Effort: Pulmonary effort is normal. No respiratory distress.     Breath sounds: Normal breath sounds. No wheezing or rales.  Abdominal:     General: Bowel sounds are normal. There is no distension.     Palpations: Abdomen is soft.     Tenderness: There is no abdominal tenderness. There is no rebound.  Musculoskeletal:        General: Tenderness present.     Cervical back: Normal range of motion.     Comments: Right knee with pain diffuse medial, lateral, patellar, superior and inferior. Soft tissue swelling appreciated and no significant joint effusion  Skin:    General: Skin is warm and dry.  Neurological:     Mental Status: She is alert and oriented to person, place, and time.     Coordination: Coordination normal.     Vitals:   11/09/23 0934 11/09/23 0939  BP: (!) 144/100 (!) 144/100  Pulse: 77   Temp: 98 F (36.7 C)   TempSrc: Oral   SpO2: 98%   Weight: 233 lb (105.7 kg)   Height: 5'  5" (1.651 m)     Assessment & Plan:

## 2023-11-09 NOTE — Telephone Encounter (Signed)
Copied from CRM [id number]. Topic: Clinical - Red Word Triage >> Nov 09, 2023  8:05 AM [name] wrote: Red Word that prompted transfer to Nurse Triage:  Pt states her R knee is swollen.  She is in much pain.  She finds it difficult to walk on it. Unable to schedule appt   Chief Complaint: knee pain and swelling Symptoms: puffy swelling to right knee, a little bit weak on that right leg/knee, sharp shooting pain, trouble walking, tingling to right foot Frequency: constant Pertinent Negatives: Patient denies fever Disposition: [] 911 / [] ED /[] Urgent Care (no appt availability in office) / [x] Appointment(In office/virtual)/ []  [facility]/ [] Home Care/ [] Refused Recommended Disposition /[]  Mobile Bus/ []  Follow-up with PCP Additional Notes: Pt reporting she has hx of arthritis but recent worsening of pain and new swelling to right knee. Pt reporting pain is "constant" 10/10 "sharp shooting" with "little bit" of burning, that goes through the whole leg and some "tingling in foot" all on right side. Pt confirms no fever or other symptoms. Pt reporting "had always been hurting but got worse all of a sudden." Advised pt be examined in next 4 hours, scheduled with [facility] office for this morning, confirmed location/appt info, pt verbalized understanding.  Reason for Disposition  [1] SEVERE pain (e.g., excruciating, unable to walk) AND [2] not improved after 2 hours of pain medicine  Answer Assessment - Initial Assessment Questions 1. LOCATION and RADIATION: "Where is the pain located?"      Right knee, whole leg, leg had always been hurting but got worse all of a sudden 2. QUALITY: "What does the pain feel like?"  (e.g., sharp, dull, aching, burning)     Sharp, shooting through, a little bit of burning feeling 3. SEVERITY: "How bad is the pain?" "What does it keep you from doing?"   (Scale 1-10; or mild, moderate, severe)   -  MILD (1-3): doesn't interfere with normal  activities    -  MODERATE (4-7): interferes with normal activities (e.g., work or school) or awakens from sleep, limping    -  SEVERE (8-10): excruciating pain, unable to do any normal activities, unable to walk     10/10, constant, tylenol not helping, able to walk a little bit but not that far 4. ONSET: "When did the pain start?" "Does it come and go, or is it there all the time?"     Been like this, been trying to go to work on it and when I do and stand on it then the pressure, it's swollen, swollen last night and not getting no better 5. RECURRENT: "Have you had this pain before?" If Yes, ask: "When, and what happened then?"     Arthritis on knee, been wearing a brace to ease the pain but the brace make it really hurt more, doc told her to wear brace 6. SETTING: "Has there been any recent work, exercise or other activity that involved that part of the body?"      Denies. 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 AGGRAVATING FACTORS: "What makes the knee pain worse?" (e.g., walking, climbing stairs, running)     Standing, walking, climbing stairs 8. ASSOCIATED SYMPTOMS: "Is there any swelling or redness of the knee?"     Swollen but got brace on it cuz got to go to work today, puffy, no discoloration 9. OTHER SYMPTOMS: "Do you have any other symptoms?" (e.g., chest pain, difficulty breathing, fever, calf pain)  Tingling at right foot  Protocols used: Knee Pain-A-AH

## 2023-11-09 NOTE — Assessment & Plan Note (Signed)
Significant progression on recent x-rays from orthopedic. No new injury to suggest acute change. Offered steroid injection in office but she has not received significant benefit in the past so she declines. Rx meloxicam 15 mg daily for next 1-2 weeks. Counseled that she likely will need joint replacement in the near future as this is bothering her more and more over time. She is using tylenol for pain currently and can continue.

## 2023-12-08 ENCOUNTER — Other Ambulatory Visit: Payer: Self-pay | Admitting: Family Medicine

## 2023-12-08 NOTE — Telephone Encounter (Signed)
 Wrong office

## 2023-12-08 NOTE — Telephone Encounter (Signed)
 Last Fill: 06/20/23  Last OV: 11/09/23 ACUTE Next OV: 12/20/23  Routing to provider for review/authorization.

## 2023-12-08 NOTE — Telephone Encounter (Signed)
 Copied from CRM [id number]. Topic: Clinical - Medication Refill >> Dec 08, 2023  9:00 AM Louie Casa B wrote: Most Recent Primary Care Visit:  Provider: Hillard Danker A  Department: [facility]  Visit Type: ACUTE  Date: 11/09/2023  Medication: amLODipine (NORVASC) 2.5 MG  Has the patient contacted their pharmacy? Yes (Agent: If no, request that the patient contact the pharmacy for the refill. If patient does not wish to contact the pharmacy document the reason why and proceed with request.) (Agent: If yes, when and what did the pharmacy advise?)  Is this the correct pharmacy for this prescription? Yes If no, delete pharmacy and type the correct one.  This is the patient's preferred pharmacy:    [facility] - [place], [place] - [address] [place] [address] Phone: [phone] Fax: [phone]  [facility] [phone] - [place], [place] - [place] [address] AT [facility] [address] [facility] [address] [place] [address] Phone: [phone] Fax: [phone]   Has the prescription been filled recently? Yes  Is the patient out of the medication? Yes  Has the patient been seen for an appointment in the last year OR does the patient have an upcoming appointment? Yes  Can we respond through MyChart? No  Agent: Please be advised that Rx refills may take up to 3 business days. We ask that you follow-up with your pharmacy.

## 2023-12-11 ENCOUNTER — Other Ambulatory Visit: Payer: Self-pay | Admitting: Family Medicine

## 2023-12-11 ENCOUNTER — Other Ambulatory Visit: Payer: Self-pay

## 2023-12-11 DIAGNOSIS — J4530 Mild persistent asthma, uncomplicated: Secondary | ICD-10-CM

## 2023-12-11 MED ORDER — AMLODIPINE BESYLATE 2.5 MG PO TABS
2.5000 mg | ORAL_TABLET | Freq: Every day | ORAL | 1 refills | Status: DC
  Filled 2023-12-11: qty 90, 90d supply, fill #0

## 2023-12-11 NOTE — Telephone Encounter (Signed)
 Copied from CRM [id number]. Topic: Clinical - Medication Refill >> Dec 11, 2023 11:01 AM Patsy Lager T wrote: Most Recent Primary Care Visit:  Provider: Hillard Danker A  Department: [facility]  Visit Type: ACUTE  Date: 11/09/2023  Medication:  amLODipine (NORVASC) 2.5 MG tablet, albuterol (VENTOLIN HFA) 108 (90 Base) MCG/ACT inhaler and budesonide-formoterol (SYMBICORT) 160-4.5 MCG/ACT inhaler  Has the patient contacted their pharmacy? Yes  Is this the correct pharmacy for this prescription? Yes  This is the patient's preferred pharmacy:   [facility] - [place], [place] - [address] [place] [address] Phone: [phone] Fax: [phone]  Has the prescription been filled recently? Yes  Is the patient out of the medication? Yes  Has the patient been seen for an appointment in the last year OR does the patient have an upcoming appointment? Yes  Can we respond through MyChart? Yes  Agent: Please be advised that Rx refills may take up to 3 business days. We ask that you follow-up with your pharmacy.  Patient took her Amlodipine on Saturday

## 2023-12-11 NOTE — Telephone Encounter (Signed)
 Wrong office

## 2023-12-11 NOTE — Telephone Encounter (Signed)
 Last Fill: Albuterol: 06/20/23     Amlodipine: 06/20/23     Symbicort: 06/20/23  Last OV: 11/09/23 ACUTE Next OV: 12/20/23  Routing to provider for review/authorization.

## 2023-12-12 ENCOUNTER — Other Ambulatory Visit: Payer: Self-pay

## 2023-12-12 ENCOUNTER — Telehealth: Payer: Self-pay

## 2023-12-12 DIAGNOSIS — J4530 Mild persistent asthma, uncomplicated: Secondary | ICD-10-CM

## 2023-12-12 MED ORDER — ALBUTEROL SULFATE HFA 108 (90 BASE) MCG/ACT IN AERS: 2.0000 | INHALATION_SPRAY | Freq: Four times a day (QID) | RESPIRATORY_TRACT | 3 refills | Status: DC | PRN

## 2023-12-12 MED ORDER — BUDESONIDE-FORMOTEROL FUMARATE 160-4.5 MCG/ACT IN AERO: 2.0000 | INHALATION_SPRAY | Freq: Two times a day (BID) | RESPIRATORY_TRACT | 6 refills | Status: AC

## 2023-12-12 MED ORDER — ALBUTEROL SULFATE (2.5 MG/3ML) 0.083% IN NEBU: 2.5000 mg | INHALATION_SOLUTION | Freq: Four times a day (QID) | RESPIRATORY_TRACT | 1 refills | Status: AC | PRN

## 2023-12-12 MED ORDER — AMLODIPINE BESYLATE 2.5 MG PO TABS
2.5000 mg | ORAL_TABLET | Freq: Every day | ORAL | 1 refills | Status: DC
  Filled 2023-12-12: qty 90, 90d supply, fill #0

## 2023-12-12 MED ORDER — AMLODIPINE BESYLATE 2.5 MG PO TABS: 2.5000 mg | ORAL_TABLET | Freq: Every day | ORAL | 1 refills | Status: DC

## 2023-12-12 NOTE — Telephone Encounter (Signed)
 Refill sent to  [id number] - [place], [place] - [address] AT [address] OF [address] . All other pharmacy save in patient chart deleted per patient request.

## 2023-12-12 NOTE — Telephone Encounter (Signed)
 Copied from CRM [id number]. Topic: Clinical - Prescription Issue >> Dec 11, 2023  4:29 PM DeAngela L wrote: Reason for CRM: Patient called this morning for a med refill but needs to change the pharmacy from [facility] to  [id number] - [place], [place] - [address] AT [facility] OF [address] [place] [address] Phone: [phone] Fax: [phone]

## 2023-12-13 ENCOUNTER — Telehealth: Payer: Self-pay

## 2023-12-13 ENCOUNTER — Other Ambulatory Visit: Payer: Self-pay

## 2023-12-13 ENCOUNTER — Other Ambulatory Visit: Payer: Self-pay | Admitting: Family Medicine

## 2023-12-13 NOTE — Telephone Encounter (Signed)
 Copied from CRM [id number]. Topic: Clinical - Medication Question >> Dec 13, 2023  3:32 PM Patsy Lager T wrote: Reason for CRM: patient called stated she has not had her amlodipine in 4 days. She is aware of the 3 business day time frame. Please f/u with patient

## 2023-12-13 NOTE — Telephone Encounter (Signed)
 Copied from CRM [id number]. Topic: Clinical - Prescription Issue >> Dec 13, 2023  2:23 PM Clayton Bibles wrote: Reason for CRM: amLODipine (NORVASC) 2.5 MG tablet on 12/12/22 and I do not see where is was sent to her pharmacy. Please send to [facility] at [facility]. She is out of this medication

## 2023-12-13 NOTE — Telephone Encounter (Signed)
 Call placed to patient to advise that medication was sent to [facility] yesterday.

## 2023-12-14 ENCOUNTER — Other Ambulatory Visit: Payer: Self-pay | Admitting: Family Medicine

## 2023-12-14 NOTE — Telephone Encounter (Signed)
 Copied from CRM [id number]. Topic: Clinical - Medication Refill >> Dec 14, 2023  9:49 AM Gildardo Pounds wrote: Most Recent Primary Care Visit:  Provider: Hillard Danker A  Department: [facility]  Visit Type: ACUTE  Date: 11/09/2023  Medication: amLODipine (NORVASC) 2.5 MG tablet  Has the patient contacted their pharmacy? Yes (Agent: If no, request that the patient contact the pharmacy for the refill. If patient does not wish to contact the pharmacy document the reason why and proceed with request.) (Agent: If yes, when and what did the pharmacy advise?)Sent to incorrect pharmacy  Is this the correct pharmacy for this prescription? Yes If no, delete pharmacy and type the correct one.  This is the patient's preferred pharmacy:  [facility] [phone] - [place], [place] - [address] AT [facility] OF [address] [place] [address] Phone: [phone] Fax: [phone]   Has the prescription been filled recently? Yes  Is the patient out of the medication? Yes  Has the patient been seen for an appointment in the last year OR does the patient have an upcoming appointment? Yes  Can we respond through MyChart? Yes  Agent: Please be advised that Rx refills may take up to 3 business days. We ask that you follow-up with your pharmacy.

## 2023-12-15 ENCOUNTER — Ambulatory Visit: Admitting: Internal Medicine

## 2023-12-15 ENCOUNTER — Other Ambulatory Visit: Payer: Self-pay

## 2023-12-15 ENCOUNTER — Encounter: Payer: Self-pay | Admitting: Internal Medicine

## 2023-12-15 VITALS — BP 134/82 | HR 67 | Temp 98.3°F | Ht 65.0 in | Wt 232.0 lb

## 2023-12-15 DIAGNOSIS — M1711 Unilateral primary osteoarthritis, right knee: Secondary | ICD-10-CM | POA: Diagnosis not present

## 2023-12-15 DIAGNOSIS — I10 Essential (primary) hypertension: Secondary | ICD-10-CM

## 2023-12-15 DIAGNOSIS — R7303 Prediabetes: Secondary | ICD-10-CM

## 2023-12-15 DIAGNOSIS — R011 Cardiac murmur, unspecified: Secondary | ICD-10-CM

## 2023-12-15 MED ORDER — AMLODIPINE BESYLATE 2.5 MG PO TABS: 2.5000 mg | ORAL_TABLET | Freq: Every day | ORAL | 1 refills | Status: DC

## 2023-12-15 MED ORDER — AMLODIPINE BESYLATE 5 MG PO TABS: 5.0000 mg | ORAL_TABLET | Freq: Every day | ORAL | 3 refills | Status: AC

## 2023-12-15 NOTE — Telephone Encounter (Signed)
 Different pharmacy. Requested Prescriptions  Pending Prescriptions Disp Refills   amLODipine (NORVASC) 2.5 MG tablet 90 tablet 1    Sig: Take 1 tablet (2.5 mg total) by mouth daily.     Cardiovascular: Calcium Channel Blockers 2 Failed - 12/15/2023  8:45 AM      Failed - Last BP in normal range    BP Readings from Last 1 Encounters:  11/09/23 (!) 144/100         Passed - Last Heart Rate in normal range    Pulse Readings from Last 1 Encounters:  11/09/23 77         Passed - Valid encounter within last 6 months    Recent Outpatient Visits           5 months ago Vasomotor symptoms due to menopause   [facility] [place]. [facility] Hoy Register, MD   1 year ago Other migraine without status migrainosus, not intractable   [facility], [place], [place]   1 year ago COVID-19 virus infection   [facility] [place]. [facility] Hoy Register, MD   1 year ago Vitamin D deficiency   [facility] Of [place]. [facility] Hoy Register, MD   1 year ago Dysuria   [facility] A Dept Of [place]. [facility] Hoy Register, MD       Future Appointments             In 5 days Hoy Register, MD [facility] - A Dept Of [place]g[place]. [facility]

## 2023-12-15 NOTE — Patient Instructions (Signed)
 Please take all new medication as prescribed - the blood pressure medication  Please continue all other medications as before, and refills have been done if requested.  Please have the pharmacy call with any other refills you may need.  Please keep your appointments with your specialists as you may have planned  You will be contacted regarding the referral for: Echocardiogram  Please consider seeing Sports Medicine on the first floor if you want the cortisone to the right knee arthritis

## 2023-12-15 NOTE — Progress Notes (Signed)
 Patient ID: [name], female   DOB: September 09, 1966, 58 y.o.   MRN: [id number]        Chief Complaint: follow up HTN, heart murmur, right knee arthritis, preDM       HPI:  Petrea Fredenburg is a 58 y.o. female here with c/o mild persistent elevated BP at home,  Pt denies chest pain, increased sob or doe, wheezing, orthopnea, PND, increased LE swelling, palpitations, dizziness or syncope.   Pt denies polydipsia, polyuria, or new focal neuro s/s.    Pt denies fever, wt loss, night sweats, loss of appetite, or other constitutional symptoms  Does also have flare of right knee arthritis with swelling in the past week but unclear reasons, pt has seen ortho remotely in the past.         Wt Readings from Last 3 Encounters:  12/15/23 232 lb (105.2 kg)  11/09/23 233 lb (105.7 kg)  06/20/23 232 lb 6.4 oz (105.4 kg)   BP Readings from Last 3 Encounters:  12/15/23 134/82  11/09/23 (!) 144/100  06/20/23 127/84         Past Medical History:  Diagnosis Date   Asthma    Gastric reflux    History of blood transfusion yrs agio, none recent   Hypertension    Osteoarthritis    Sleep apnea    cpap machine has not used since 2014 due to machine/tubing in bad shape    Syncope and collapse yrs ago   Past Surgical History:  Procedure Laterality Date   CARDIAC CATHETERIZATION     more than 5 years ago   CARDIAC CATHETERIZATION  06-07-2012   [facility]: Normal coronary arteries. No significant LVOT/aortic valve gradient   CHOLECYSTECTOMY     COLONOSCOPY WITH PROPOFOL N/A 02/27/2014   Procedure: COLONOSCOPY WITH PROPOFOL;  Surgeon: Charolett Bumpers, MD;  Location: [facility];  Service: Endoscopy;  Laterality: N/A;   ESOPHAGOGASTRODUODENOSCOPY (EGD) WITH PROPOFOL N/A 02/27/2014   Procedure: ESOPHAGOGASTRODUODENOSCOPY (EGD) WITH PROPOFOL;  Surgeon: Charolett Bumpers, MD;  Location: [facility];  Service: Endoscopy;  Laterality: N/A;   EXPLORATORY LAPAROTOMY  09/2001   ELAP, LOA, R partial oophorectomy, repair of  bowel injury   VAGINAL HYSTERECTOMY  2001   Adnexa left in place    reports that she has never smoked. She has never used smokeless tobacco. She reports that she does not drink alcohol and does not use drugs. Family history is unknown by patient. Allergies  Allergen Reactions   Tramadol Hives, Itching and Swelling    Pt states after medication intake she had to go to the ER.    Current Outpatient Medications on File Prior to Visit  Medication Sig Dispense Refill   acetaminophen-codeine (TYLENOL #3) 300-30 MG tablet Take 1 tablet by mouth every 4 (four) hours as needed for moderate pain. 30 tablet 0   albuterol (PROVENTIL) (2.5 MG/3ML) 0.083% nebulizer solution Take 3 mLs (2.5 mg total) by nebulization every 6 (six) hours as needed for wheezing or shortness of breath. 150 mL 1   albuterol (VENTOLIN HFA) 108 (90 Base) MCG/ACT inhaler Inhale 2 puffs into the lungs every 6 (six) hours as needed for wheezing or shortness of breath. 18 g 3   atorvastatin (LIPITOR) 40 MG tablet Take 1 tablet (40 mg total) by mouth daily. 90 tablet 1   Blood Pressure KIT Use as instructed Dx: Hypertension 1 kit 0   budesonide-formoterol (SYMBICORT) 160-4.5 MCG/ACT inhaler Inhale 2 puffs into the lungs 2 (two) times daily. 10.2 g  6   cetirizine (ZYRTEC) 10 MG tablet Take 1 tablet (10 mg total) by mouth daily. 30 tablet 2   diclofenac Sodium (VOLTAREN) 1 % GEL Apply 2 g topically 4 (four) times daily. 100 g 3   ipratropium (ATROVENT) 0.03 % nasal spray Place 2 sprays into both nostrils every 12 (twelve) hours. 30 mL 0   meloxicam (MOBIC) 15 MG tablet Take 1 tablet (15 mg total) by mouth daily. 30 tablet 0   Misc. Devices MISC Blood Pressure Monitor 1 each 0   Misc. Devices MISC Nebulizer device. Diagnosis - Asthma 1 each 0   ondansetron (ZOFRAN-ODT) 4 MG disintegrating tablet Take 1 tablet (4 mg total) by mouth every 6 (six) hours as needed for nausea or vomiting. 15 tablet 0   potassium chloride SA (KLOR-CON M) 20  MEQ tablet Take 1 tablet (20 mEq total) by mouth daily. 90 tablet 1   promethazine (PHENERGAN) 25 MG tablet Take 1 tablet (25 mg total) by mouth every 8 (eight) hours as needed for nausea or vomiting. 20 tablet 0   SUMAtriptan (IMITREX) 50 MG tablet Take 1 tablet (50 mg total) by mouth every 2 (two) hours as needed for migraine. May repeat in 2 hours if headache persists or recurs. 10 tablet 0   venlafaxine XR (EFFEXOR XR) 75 MG 24 hr capsule Take 1 capsule (75 mg total) by mouth daily with breakfast. 90 capsule 1   Vitamin D, Ergocalciferol, (DRISDOL) 1.25 MG (50000 UNIT) CAPS capsule Take 1 capsule (50,000 Units total) by mouth every 7 (seven) days. 4 capsule 2   benzonatate (TESSALON) 100 MG capsule Take 1 capsule (100 mg total) by mouth 3 (three) times daily as needed for cough. (Patient not taking: Reported on 10/19/2022) 30 capsule 0   [DISCONTINUED] fluticasone (FLONASE) 50 MCG/ACT nasal spray Place 2 sprays into both nostrils daily. 16 g 1   No current facility-administered medications on file prior to visit.        ROS:  All others reviewed and negative.  Objective        PE:  BP 134/82 (BP Location: Right Arm, Patient Position: Sitting, Cuff Size: Normal)   Pulse 67   Temp 98.3 F (36.8 C) (Oral)   Ht 5\' 5"  (1.651 m)   Wt 232 lb (105.2 kg)   SpO2 98%   BMI 38.61 kg/m                 Constitutional: Pt appears in NAD               HENT: Head: NCAT.                Right Ear: External ear normal.                 Left Ear: External ear normal.                Eyes: . Pupils are equal, round, and reactive to light. Conjunctivae and EOM are normal               Nose: without d/c or deformity               Neck: Neck supple. Gross normal ROM               Cardiovascular: Normal rate and regular rhythm. With gr 2-3/6 sys murmur RUSB                Pulmonary/Chest: Effort normal and breath sounds  without rales or wheezing.                Abd:  Soft, NT, ND, + BS, no organomegaly                Neurological: Pt is alert. At baseline orientation, motor grossly intact               Skin: Skin is warm. No rashes, no other new lesions, LE edema - none  right knee with small effusion, crepitus                Psychiatric: Pt behavior is normal without agitation   Micro: none  Cardiac tracings I have personally interpreted today:  none  Pertinent Radiological findings (summarize): none   Lab Results  Component Value Date   WBC 6.8 04/26/2023   HGB 12.6 04/26/2023   HCT 39.3 04/26/2023   PLT 321 04/26/2023   GLUCOSE 103 (H) 04/26/2023   CHOL 200 (H) 06/20/2023   TRIG 93 06/20/2023   HDL 56 06/20/2023   LDLCALC 127 (H) 06/20/2023   ALT 70 (H) 04/26/2023   AST 31 04/26/2023   NA 144 04/26/2023   K 3.3 (L) 06/20/2023   CL 109 04/26/2023   CREATININE 0.52 04/26/2023   BUN 18 04/26/2023   CO2 27 04/26/2023   TSH 1.580 10/19/2022   INR 1.1 06/04/2012   HGBA1C 5.9 (H) 06/20/2023   Assessment/Plan:  Talecia Sherlin is a 58 y.o. Black or African American [2] female with  has a past medical history of Asthma, Gastric reflux, History of blood transfusion (yrs agio, none recent), Hypertension, Osteoarthritis, Sleep apnea, and Syncope and collapse (yrs ago).  Essential hypertension BP Readings from Last 3 Encounters:  12/15/23 134/82  11/09/23 (!) 144/100  06/20/23 127/84   Stable, pt to increase medical treatment amlodipine 5 mg every day, cont wt control, diet, low salt excercise   Heart murmur New onst apparently - for Echocardiogram r/o AS vs other  Pre-diabetes Lab Results  Component Value Date   HGBA1C 5.9 (H) 06/20/2023   Stable, pt to continue current medical treatment  - diet, wt control   Primary osteoarthritis of right knee Mild to mod, for pt to see sport med on first floor asap,  to f/u any worsening symptoms or concerns  Followup: Return if symptoms worsen or fail to improve.  Oliver Barre, MD 12/17/2023 3:59 PM [facility]  Primary Care - [facility] Internal Medicine

## 2023-12-17 ENCOUNTER — Encounter: Payer: Self-pay | Admitting: Internal Medicine

## 2023-12-17 DIAGNOSIS — R011 Cardiac murmur, unspecified: Secondary | ICD-10-CM | POA: Insufficient documentation

## 2023-12-17 NOTE — Assessment & Plan Note (Signed)
 BP Readings from Last 3 Encounters:  12/15/23 134/82  11/09/23 (!) 144/100  06/20/23 127/84   Stable, pt to increase medical treatment amlodipine 5 mg every day, cont wt control, diet, low salt excercise

## 2023-12-17 NOTE — Assessment & Plan Note (Signed)
 Lab Results  Component Value Date   HGBA1C 5.9 (H) 06/20/2023   Stable, pt to continue current medical treatment  - diet, wt control

## 2023-12-17 NOTE — Assessment & Plan Note (Signed)
 Mild to mod, for pt to see sport med on first floor asap,  to f/u any worsening symptoms or concerns

## 2023-12-17 NOTE — Assessment & Plan Note (Signed)
 New onst apparently - for Echocardiogram r/o AS vs other

## 2023-12-20 ENCOUNTER — Ambulatory Visit: Payer: Medicaid Other | Admitting: Family Medicine

## 2023-12-22 ENCOUNTER — Other Ambulatory Visit: Payer: Self-pay

## 2023-12-31 ENCOUNTER — Emergency Department (HOSPITAL_COMMUNITY)

## 2023-12-31 ENCOUNTER — Other Ambulatory Visit: Payer: Self-pay

## 2023-12-31 ENCOUNTER — Emergency Department (HOSPITAL_COMMUNITY)
Admission: EM | Admit: 2023-12-31 | Discharge: 2024-01-01 | Disposition: A | Discharge status: Home / Self Care | Attending: Emergency Medicine | Admitting: Emergency Medicine

## 2023-12-31 DIAGNOSIS — J45901 Unspecified asthma with (acute) exacerbation: Secondary | ICD-10-CM | POA: Insufficient documentation

## 2023-12-31 DIAGNOSIS — E876 Hypokalemia: Secondary | ICD-10-CM | POA: Diagnosis not present

## 2023-12-31 DIAGNOSIS — R0602 Shortness of breath: Secondary | ICD-10-CM | POA: Diagnosis not present

## 2023-12-31 DIAGNOSIS — Z7951 Long term (current) use of inhaled steroids: Secondary | ICD-10-CM | POA: Insufficient documentation

## 2023-12-31 DIAGNOSIS — R059 Cough, unspecified: Secondary | ICD-10-CM | POA: Diagnosis present

## 2023-12-31 LAB — CBC WITH DIFFERENTIAL/PLATELET
Abs Immature Granulocytes: 0.02 10*3/uL (ref 0.00–0.07)
Basophils Absolute: 0 10*3/uL (ref 0.0–0.1)
Basophils Relative: 0 %
Eosinophils Absolute: 0.1 10*3/uL (ref 0.0–0.5)
Eosinophils Relative: 2 %
HCT: 41.1 % (ref 36.0–46.0)
Hemoglobin: 13.1 g/dL (ref 12.0–15.0)
Immature Granulocytes: 0 %
Lymphocytes Relative: 38 %
Lymphs Abs: 2.6 10*3/uL (ref 0.7–4.0)
MCH: 27.3 pg (ref 26.0–34.0)
MCHC: 31.9 g/dL (ref 30.0–36.0)
MCV: 85.6 fL (ref 80.0–100.0)
Monocytes Absolute: 0.6 10*3/uL (ref 0.1–1.0)
Monocytes Relative: 9 %
Neutro Abs: 3.4 10*3/uL (ref 1.7–7.7)
Neutrophils Relative %: 51 %
Platelets: 282 10*3/uL (ref 150–400)
RBC: 4.8 MIL/uL (ref 3.87–5.11)
RDW: 14.1 % (ref 11.5–15.5)
WBC: 6.8 10*3/uL (ref 4.0–10.5)
nRBC: 0 % (ref 0.0–0.2)

## 2023-12-31 NOTE — ED Provider Notes (Signed)
 [place] EMERGENCY DEPARTMENT AT [facility] Provider Note   CSN: [id number] Arrival date & time: 12/31/23  2242     History {Add pertinent medical, surgical, social history, OB history to HPI:1} Chief Complaint  Patient presents with   Shortness of Breath    Patient brought in by [facility] for SOB on exertion. Patient using home ned at home with no relief. Hx asthma. 10 albuterol and .5mg  atrovent  and 125mg  solumedrol with medics. On 8L neb now     [name] is a 58 y.o. female.  Patient presents by ambulance from home for shortness of breath.  She reports a history of asthma, breathing significantly worsened today.  She had tried to use her home nebulizers without improvement.  She is brought to the emergency department having received 10 mg of albuterol, 0.5 mg of Atrovent and 125 mg of Solu-Medrol.  Breathing is improved.  She reports increased cough, pain with coughing.       Home Medications Prior to Admission medications   Medication Sig Start Date End Date Taking? Authorizing Provider  acetaminophen-codeine (TYLENOL #3) 300-30 MG tablet Take 1 tablet by mouth every 4 (four) hours as needed for moderate pain. 03/24/21   Kirtland Bouchard, PA-C  albuterol (PROVENTIL) (2.5 MG/3ML) 0.083% nebulizer solution Take 3 mLs (2.5 mg total) by nebulization every 6 (six) hours as needed for wheezing or shortness of breath. 12/12/23   Hoy Register, MD  albuterol (VENTOLIN HFA) 108 (90 Base) MCG/ACT inhaler Inhale 2 puffs into the lungs every 6 (six) hours as needed for wheezing or shortness of breath. 12/12/23   Hoy Register, MD  amLODipine (NORVASC) 5 MG tablet Take 1 tablet (5 mg total) by mouth daily. 12/15/23 12/14/24  Corwin Levins, MD  atorvastatin (LIPITOR) 40 MG tablet Take 1 tablet (40 mg total) by mouth daily. 06/21/23   Hoy Register, MD  benzonatate (TESSALON) 100 MG capsule Take 1 capsule (100 mg total) by mouth 3 (three) times daily as needed for  cough. Patient not taking: Reported on 10/19/2022 03/16/22   Waldon Merl, PA-C  Blood Pressure KIT Use as instructed Dx: Hypertension 07/15/19   Hoy Register, MD  budesonide-formoterol (SYMBICORT) 160-4.5 MCG/ACT inhaler Inhale 2 puffs into the lungs 2 (two) times daily. 12/12/23   Hoy Register, MD  cetirizine (ZYRTEC) 10 MG tablet Take 1 tablet (10 mg total) by mouth daily. 04/20/22   Hoy Register, MD  diclofenac Sodium (VOLTAREN) 1 % GEL Apply 2 g topically 4 (four) times daily. 10/28/21   Anders Simmonds, PA-C  ipratropium (ATROVENT) 0.03 % nasal spray Place 2 sprays into both nostrils every 12 (twelve) hours. 04/20/22   Hoy Register, MD  meloxicam (MOBIC) 15 MG tablet Take 1 tablet (15 mg total) by mouth daily. 11/09/23   Myrlene Broker, MD  Misc. Devices MISC Blood Pressure Monitor 04/14/23   Hoy Register, MD  Misc. Devices MISC Nebulizer device. Diagnosis - Asthma 06/20/23   Hoy Register, MD  ondansetron (ZOFRAN-ODT) 4 MG disintegrating tablet Take 1 tablet (4 mg total) by mouth every 6 (six) hours as needed for nausea or vomiting. 06/08/22   Lamptey, Britta Mccreedy, MD  potassium chloride SA (KLOR-CON M) 20 MEQ tablet Take 1 tablet (20 mEq total) by mouth daily. 06/21/23   Hoy Register, MD  promethazine (PHENERGAN) 25 MG tablet Take 1 tablet (25 mg total) by mouth every 8 (eight) hours as needed for nausea or vomiting. 06/08/22   Hoy Register,  MD  SUMAtriptan (IMITREX) 50 MG tablet Take 1 tablet (50 mg total) by mouth every 2 (two) hours as needed for migraine. May repeat in 2 hours if headache persists or recurs. 10/19/22   Mayers, Cari S, PA-C  venlafaxine XR (EFFEXOR XR) 75 MG 24 hr capsule Take 1 capsule (75 mg total) by mouth daily with breakfast. 06/20/23   Hoy Register, MD  Vitamin D, Ergocalciferol, (DRISDOL) 1.25 MG (50000 UNIT) CAPS capsule Take 1 capsule (50,000 Units total) by mouth every 7 (seven) days. 10/20/22   Mayers, Cari S, PA-C  fluticasone (FLONASE) 50  MCG/ACT nasal spray Place 2 sprays into both nostrils daily. 07/15/19 04/01/20  Hoy Register, MD      Allergies    Tramadol    Review of Systems   Review of Systems  Physical Exam Updated Vital Signs SpO2 100%  Physical Exam Vitals and nursing note reviewed.  Constitutional:      General: She is not in acute distress.    Appearance: She is well-developed.  HENT:     Head: Normocephalic and atraumatic.     Mouth/Throat:     Mouth: Mucous membranes are moist.  Eyes:     General: Vision grossly intact. Gaze aligned appropriately.     Extraocular Movements: Extraocular movements intact.     Conjunctiva/sclera: Conjunctivae normal.  Cardiovascular:     Rate and Rhythm: Normal rate and regular rhythm.     Pulses: Normal pulses.     Heart sounds: Normal heart sounds, S1 normal and S2 normal. No murmur heard.    No friction rub. No gallop.  Pulmonary:     Effort: Pulmonary effort is normal. No respiratory distress.     Breath sounds: Normal breath sounds.  Abdominal:     General: Bowel sounds are normal.     Palpations: Abdomen is soft.     Tenderness: There is no abdominal tenderness. There is no guarding or rebound.     Hernia: No hernia is present.  Musculoskeletal:        General: No swelling.     Cervical back: Full passive range of motion without pain, normal range of motion and neck supple. No spinous process tenderness or muscular tenderness. Normal range of motion.     Right lower leg: No edema.     Left lower leg: No edema.  Skin:    General: Skin is warm and dry.     Capillary Refill: Capillary refill takes less than 2 seconds.     Findings: No ecchymosis, erythema, rash or wound.  Neurological:     General: No focal deficit present.     Mental Status: She is alert and oriented to person, place, and time.     GCS: GCS eye subscore is 4. GCS verbal subscore is 5. GCS motor subscore is 6.     Cranial Nerves: Cranial nerves 2-12 are intact.     Sensory: Sensation  is intact.     Motor: Motor function is intact.     Coordination: Coordination is intact.  Psychiatric:        Attention and Perception: Attention normal.        Mood and Affect: Mood normal.        Speech: Speech normal.        Behavior: Behavior normal.     ED Results / Procedures / Treatments   Labs (all labs ordered are listed, but only abnormal results are displayed) Labs Reviewed  RESP PANEL BY RT-PCR (RSV,  FLU A&B, COVID)  RVPGX2  CBC WITH DIFFERENTIAL/PLATELET  BASIC METABOLIC PANEL  BRAIN NATRIURETIC PEPTIDE  TROPONIN I (HIGH SENSITIVITY)    EKG None  Radiology No results found.  Procedures Procedures  {Document cardiac monitor, telemetry assessment procedure when appropriate:1}  Medications Ordered in ED Medications - No data to display  ED Course/ Medical Decision Making/ A&P   {   Click here for ABCD2, HEART and other calculatorsREFRESH Note before signing :1}                              Medical Decision Making Amount and/or Complexity of Data Reviewed Labs: ordered. Radiology: ordered.   ***  {Document critical care time when appropriate:1} {Document review of labs and clinical decision tools ie heart score, Chads2Vasc2 etc:1}  {Document your independent review of radiology images, and any outside records:1} {Document your discussion with family members, caretakers, and with consultants:1} {Document social determinants of health affecting pt's care:1} {Document your decision making why or why not admission, treatments were needed:1} Final Clinical Impression(s) / ED Diagnoses Final diagnoses:  None    Rx / DC Orders ED Discharge Orders     None

## 2024-01-01 LAB — BASIC METABOLIC PANEL
Anion gap: 9 (ref 5–15)
BUN: 11 mg/dL (ref 6–20)
CO2: 25 mmol/L (ref 22–32)
Calcium: 9.2 mg/dL (ref 8.9–10.3)
Chloride: 108 mmol/L (ref 98–111)
Creatinine, Ser: 0.58 mg/dL (ref 0.44–1.00)
GFR, Estimated: >60 mL/min (ref >60)
Glucose, Bld: 107 mg/dL — ABNORMAL HIGH (ref 70–99)
Potassium: 2.7 mmol/L — CL (ref 3.5–5.1)
Sodium: 142 mmol/L (ref 135–145)

## 2024-01-01 LAB — RESP PANEL BY RT-PCR (RSV, FLU A&B, COVID)  RVPGX2
Influenza A by PCR: NEGATIVE
Influenza B by PCR: NEGATIVE
Resp Syncytial Virus by PCR: NEGATIVE
SARS Coronavirus 2 by RT PCR: NEGATIVE

## 2024-01-01 LAB — TROPONIN I (HIGH SENSITIVITY)
Troponin I (High Sensitivity): 2 ng/L (ref <18)
Troponin I (High Sensitivity): 3 ng/L (ref <18)

## 2024-01-01 LAB — BRAIN NATRIURETIC PEPTIDE: B Natriuretic Peptide: 31.1 pg/mL (ref 0.0–100.0)

## 2024-01-01 LAB — D-DIMER, QUANTITATIVE: D-Dimer, Quant: <0.27 ug{FEU}/mL (ref 0.00–0.50)

## 2024-01-01 MED ORDER — POTASSIUM CHLORIDE CRYS ER 20 MEQ PO TBCR
40.0000 meq | EXTENDED_RELEASE_TABLET | Freq: Once | ORAL | Status: AC
  Administered 2024-01-01: 40 meq via ORAL
  Filled 2024-01-01: qty 2

## 2024-01-01 MED ORDER — PREDNISONE 20 MG PO TABS
40.0000 mg | ORAL_TABLET | Freq: Every day | ORAL | 0 refills | Status: DC
Start: 2024-01-01 — End: 2024-01-31

## 2024-01-01 MED ORDER — POTASSIUM CHLORIDE CRYS ER 20 MEQ PO TBCR: 40.0000 meq | EXTENDED_RELEASE_TABLET | Freq: Every day | ORAL | 0 refills | Status: DC

## 2024-01-01 NOTE — ED Notes (Signed)
 Patient d/c with home care instructions.

## 2024-01-10 ENCOUNTER — Encounter: Payer: Self-pay | Admitting: Internal Medicine

## 2024-01-10 ENCOUNTER — Ambulatory Visit (HOSPITAL_COMMUNITY): Attending: Cardiology

## 2024-01-10 DIAGNOSIS — R011 Cardiac murmur, unspecified: Secondary | ICD-10-CM

## 2024-01-10 LAB — ECHOCARDIOGRAM COMPLETE
Area-P 1/2: 2.95 cm2
S' Lateral: 3 cm

## 2024-01-31 ENCOUNTER — Emergency Department (HOSPITAL_COMMUNITY)
Admission: EM | Admit: 2024-01-31 | Discharge: 2024-01-31 | Disposition: A | Discharge status: Home / Self Care | Attending: Emergency Medicine | Admitting: Emergency Medicine

## 2024-01-31 ENCOUNTER — Encounter (HOSPITAL_COMMUNITY): Payer: Self-pay

## 2024-01-31 ENCOUNTER — Other Ambulatory Visit: Payer: Self-pay

## 2024-01-31 ENCOUNTER — Emergency Department (HOSPITAL_COMMUNITY)

## 2024-01-31 DIAGNOSIS — M5441 Lumbago with sciatica, right side: Secondary | ICD-10-CM | POA: Diagnosis not present

## 2024-01-31 DIAGNOSIS — M545 Low back pain, unspecified: Secondary | ICD-10-CM | POA: Diagnosis present

## 2024-01-31 DIAGNOSIS — M5431 Sciatica, right side: Secondary | ICD-10-CM

## 2024-01-31 DIAGNOSIS — M25561 Pain in right knee: Secondary | ICD-10-CM | POA: Diagnosis not present

## 2024-01-31 MED ORDER — IBUPROFEN 600 MG PO TABS: 600.0000 mg | ORAL_TABLET | Freq: Four times a day (QID) | ORAL | 0 refills | Status: DC | PRN

## 2024-01-31 MED ORDER — PREDNISONE 20 MG PO TABS: ORAL_TABLET | ORAL | 0 refills | Status: DC

## 2024-01-31 MED ORDER — CYCLOBENZAPRINE HCL 10 MG PO TABS: 10.0000 mg | ORAL_TABLET | Freq: Two times a day (BID) | ORAL | 0 refills | Status: DC | PRN

## 2024-01-31 NOTE — ED Triage Notes (Signed)
 Pt to ED c/o lower back pain for two days  and right knee pain for weeks. No known injury. Denies bowel/bladder incontinence. Denies urinary symptoms.

## 2024-01-31 NOTE — ED Provider Notes (Signed)
 [place] EMERGENCY DEPARTMENT AT [facility] Provider Note   CSN: [id number] Arrival date & time: 01/31/24  1011     History  Chief Complaint  Patient presents with   Back Pain   Knee Pain    right    [name] is a 58 y.o. female.  The history is provided by the patient and medical records. No language interpreter was used.  Back Pain Knee Pain Associated symptoms: back pain      58 year old female here with c/o leg pain.  Report for the past 2 weeks she has had pain from her R lower back radiate down to her R knee.  Pain worsening with prolonged standing and with walking and not improved with OTC meds or home remedy.  Had to miss work today due to pain. No fever, chills, dysuria, hematuria, bowel/bladder incontinence or saddle anesthesia.  No trauma.  No hx of IVDU or active CA.    Home Medications Prior to Admission medications   Medication Sig Start Date End Date Taking? Authorizing Provider  acetaminophen -codeine  (TYLENOL  #3) 300-30 MG tablet Take 1 tablet by mouth every 4 (four) hours as needed for moderate pain. 03/24/21   Bronson Canny, PA-C  albuterol  (PROVENTIL ) (2.5 MG/3ML) 0.083% nebulizer solution Take 3 mLs (2.5 mg total) by nebulization every 6 (six) hours as needed for wheezing or shortness of breath. 12/12/23   Newlin, Enobong, MD  albuterol  (VENTOLIN  HFA) 108 (90 Base) MCG/ACT inhaler Inhale 2 puffs into the lungs every 6 (six) hours as needed for wheezing or shortness of breath. 12/12/23   Newlin, Enobong, MD  amLODipine  (NORVASC ) 5 MG tablet Take 1 tablet (5 mg total) by mouth daily. 12/15/23 12/14/24  Roslyn Coombe, MD  atorvastatin  (LIPITOR) 40 MG tablet Take 1 tablet (40 mg total) by mouth daily. 06/21/23   Newlin, Enobong, MD  benzonatate  (TESSALON ) 100 MG capsule Take 1 capsule (100 mg total) by mouth 3 (three) times daily as needed for cough. Patient not taking: Reported on 10/19/2022 03/16/22   Farris Hong, PA-C  Blood Pressure KIT Use as  instructed Dx: Hypertension 07/15/19   Newlin, Enobong, MD  budesonide -formoterol  (SYMBICORT ) 160-4.5 MCG/ACT inhaler Inhale 2 puffs into the lungs 2 (two) times daily. 12/12/23   Newlin, Enobong, MD  cetirizine  (ZYRTEC ) 10 MG tablet Take 1 tablet (10 mg total) by mouth daily. 04/20/22   Newlin, Enobong, MD  diclofenac  Sodium (VOLTAREN ) 1 % GEL Apply 2 g topically 4 (four) times daily. 10/28/21   Hassie Lint, PA-C  ipratropium (ATROVENT ) 0.03 % nasal spray Place 2 sprays into both nostrils every 12 (twelve) hours. 04/20/22   Newlin, Enobong, MD  lisinopril (ZESTRIL) 10 MG tablet Take 10 mg by mouth daily. 12/14/23   [provider]  meloxicam  (MOBIC ) 15 MG tablet Take 1 tablet (15 mg total) by mouth daily. 11/09/23   Adelia Homestead, MD  Misc. Devices MISC Blood Pressure Monitor 04/14/23   Joaquin Mulberry, MD  Misc. Devices MISC Nebulizer device. Diagnosis - Asthma 06/20/23   Joaquin Mulberry, MD  ondansetron  (ZOFRAN -ODT) 4 MG disintegrating tablet Take 1 tablet (4 mg total) by mouth every 6 (six) hours as needed for nausea or vomiting. 06/08/22   Lamptey, Donley Furth, MD  potassium chloride  SA (KLOR-CON  M) 20 MEQ tablet Take 1 tablet (20 mEq total) by mouth daily. 06/21/23   Newlin, Enobong, MD  potassium chloride  SA (KLOR-CON  M) 20 MEQ tablet Take 2 tablets (40 mEq total)  by mouth daily. 01/01/24   Ballard Bongo, MD  predniSONE  (DELTASONE ) 20 MG tablet Take 2 tablets (40 mg total) by mouth daily with breakfast. 01/01/24   Pollina, Marine Sia, MD  promethazine  (PHENERGAN ) 25 MG tablet Take 1 tablet (25 mg total) by mouth every 8 (eight) hours as needed for nausea or vomiting. 06/08/22   Newlin, Enobong, MD  SUMAtriptan  (IMITREX ) 50 MG tablet Take 1 tablet (50 mg total) by mouth every 2 (two) hours as needed for migraine. May repeat in 2 hours if headache persists or recurs. 10/19/22   Mayers, Cari S, PA-C  venlafaxine  XR (EFFEXOR  XR) 75 MG 24 hr capsule Take 1 capsule (75 mg total) by  mouth daily with breakfast. 06/20/23   Joaquin Mulberry, MD  Vitamin D , Ergocalciferol , (DRISDOL ) 1.25 MG (50000 UNIT) CAPS capsule Take 1 capsule (50,000 Units total) by mouth every 7 (seven) days. 10/20/22   Mayers, Cari S, PA-C  fluticasone  (FLONASE ) 50 MCG/ACT nasal spray Place 2 sprays into both nostrils daily. 07/15/19 04/01/20  Newlin, Enobong, MD      Allergies    Tramadol     Review of Systems   Review of Systems  Musculoskeletal:  Positive for back pain.  All other systems reviewed and are negative.   Physical Exam Updated Vital Signs BP (!) 148/81   Pulse 74   Temp 98.1 F (36.7 C)   Resp 17   Ht 5\' 4"  (1.626 m)   Wt 108.9 kg   SpO2 97%   BMI 41.20 kg/m  Physical Exam Vitals and nursing note reviewed.  Constitutional:      General: She is not in acute distress.    Appearance: She is well-developed. She is obese.  HENT:     Head: Atraumatic.  Eyes:     Conjunctiva/sclera: Conjunctivae normal.  Cardiovascular:     Rate and Rhythm: Normal rate and regular rhythm.  Pulmonary:     Effort: Pulmonary effort is normal.  Abdominal:     Palpations: Abdomen is soft.     Tenderness: There is no abdominal tenderness.  Musculoskeletal:        General: Tenderness (ttp lumbar region with positive R SLR.  able to ambulate. knee exam unremarkable.  patella DTR intact, pedal pulse palpable) present.     Cervical back: Neck supple.  Skin:    Findings: No rash.  Neurological:     Mental Status: She is alert.  Psychiatric:        Mood and Affect: Mood normal.     ED Results / Procedures / Treatments   Labs (all labs ordered are listed, but only abnormal results are displayed) Labs Reviewed - No data to display  EKG None  Radiology DG Knee Complete 4 Views Right Result Date: 01/31/2024 CLINICAL DATA:  Pain in the right knee worsening over the last few days. No history of trauma EXAM: RIGHT KNEE - COMPLETE 4 VIEW COMPARISON:  X-ray 08/25/2023 FINDINGS: No fracture or  dislocation. Moderate osteophytes seen of all 3 compartments. Preserved bone mineralization. No significant joint effusion on lateral view. Hyperostosis of the patella. There is small density along the intercondylar notch. Possible small joint body. IMPRESSION: Shrapnel degenerative changes.  Possible joint body. Electronically Signed   By: Adrianna Horde M.D.   On: 01/31/2024 11:01    Procedures Procedures    Medications Ordered in ED Medications - No data to display  ED Course/ Medical Decision Making/ A&P  Medical Decision Making Amount and/or Complexity of Data Reviewed Radiology: ordered.   BP (!) 148/81   Pulse 74   Temp 98.1 F (36.7 C)   Resp 17   Ht 5\' 4"  (1.626 m)   Wt 108.9 kg   SpO2 97%   BMI 41.20 kg/m   7:79 PM  58 year old female here with c/o leg pain.  Report for the past 2 weeks she has had pain from her R lower back radiate down to her R knee.  Pain worsening with prolonged standing and with walking and not improved with OTC meds or home remedy.  Had to miss work today due to pain. No fever, chills, dysuria, hematuria, bowel/bladder incontinence or saddle anesthesia.  No trauma.  No hx of IVDU or active CA.    Exam notable for positive R straight leg raise, suggestive of sciatica.  Xray of R knee obtain and viewed and interpreted by me and are reassuring.  Agrees with radiology interpretation.  Will treat with prednisone , flexeril  and ibuprofen .  Retun precaution given.    Ddx: strain, sprain, contusion, sciatica, caudal equina, kidney stone, dissection.            Final Clinical Impression(s) / ED Diagnoses Final diagnoses:  Sciatica, right side    Rx / DC Orders ED Discharge Orders          Ordered    predniSONE  (DELTASONE ) 20 MG tablet        01/31/24 1442    cyclobenzaprine  (FLEXERIL ) 10 MG tablet  2 times daily PRN        01/31/24 1442    ibuprofen  (ADVIL ) 600 MG tablet  Every 6 hours PRN        01/31/24  1442              Debbra Fairy, PA-C 01/31/24 1443    Rolinda Climes, DO 02/02/24 1455

## 2024-02-20 NOTE — Progress Notes (Signed)
 This encounter was created in error - please disregard.

## 2024-04-17 ENCOUNTER — Ambulatory Visit: Admitting: Family Medicine

## 2024-06-24 ENCOUNTER — Emergency Department (HOSPITAL_COMMUNITY)

## 2024-06-24 ENCOUNTER — Other Ambulatory Visit: Payer: Self-pay

## 2024-06-24 ENCOUNTER — Emergency Department (HOSPITAL_COMMUNITY)
Admission: EM | Admit: 2024-06-24 | Discharge: 2024-06-25 | Disposition: A | Discharge status: Home / Self Care | Attending: Emergency Medicine | Admitting: Emergency Medicine

## 2024-06-24 DIAGNOSIS — E876 Hypokalemia: Secondary | ICD-10-CM | POA: Insufficient documentation

## 2024-06-24 DIAGNOSIS — Z79899 Other long term (current) drug therapy: Secondary | ICD-10-CM | POA: Diagnosis not present

## 2024-06-24 DIAGNOSIS — I10 Essential (primary) hypertension: Secondary | ICD-10-CM | POA: Diagnosis not present

## 2024-06-24 DIAGNOSIS — R079 Chest pain, unspecified: Secondary | ICD-10-CM | POA: Diagnosis present

## 2024-06-24 DIAGNOSIS — J45909 Unspecified asthma, uncomplicated: Secondary | ICD-10-CM | POA: Insufficient documentation

## 2024-06-24 DIAGNOSIS — R0789 Other chest pain: Secondary | ICD-10-CM | POA: Insufficient documentation

## 2024-06-24 LAB — CBC
HCT: 39 % (ref 36.0–46.0)
Hemoglobin: 12.2 g/dL (ref 12.0–15.0)
MCH: 26.5 pg (ref 26.0–34.0)
MCHC: 31.3 g/dL (ref 30.0–36.0)
MCV: 84.6 fL (ref 80.0–100.0)
Platelets: 333 K/uL (ref 150–400)
RBC: 4.61 MIL/uL (ref 3.87–5.11)
RDW: 13.8 % (ref 11.5–15.5)
WBC: 7.8 K/uL (ref 4.0–10.5)
nRBC: 0 % (ref 0.0–0.2)

## 2024-06-24 LAB — TROPONIN I (HIGH SENSITIVITY)
Troponin I (High Sensitivity): 4 ng/L (ref <18)
Troponin I (High Sensitivity): 4 ng/L (ref <18)

## 2024-06-24 LAB — BASIC METABOLIC PANEL WITH GFR
Anion gap: 7 (ref 5–15)
BUN: 12 mg/dL (ref 6–20)
CO2: 28 mmol/L (ref 22–32)
Calcium: 9.5 mg/dL (ref 8.9–10.3)
Chloride: 105 mmol/L (ref 98–111)
Creatinine, Ser: 0.63 mg/dL (ref 0.44–1.00)
GFR, Estimated: >60 mL/min (ref >60)
Glucose, Bld: 106 mg/dL — ABNORMAL HIGH (ref 70–99)
Potassium: 2.9 mmol/L — ABNORMAL LOW (ref 3.5–5.1)
Sodium: 140 mmol/L (ref 135–145)

## 2024-06-24 MED ORDER — IBUPROFEN 400 MG PO TABS
600.0000 mg | ORAL_TABLET | Freq: Four times a day (QID) | ORAL | Status: DC | PRN
  Administered 2024-06-24: 600 mg via ORAL
  Filled 2024-06-24: qty 1

## 2024-06-24 MED ORDER — LIDOCAINE 5 % EX PTCH
1.0000 | MEDICATED_PATCH | CUTANEOUS | Status: DC
  Administered 2024-06-24: 1 via TRANSDERMAL
  Filled 2024-06-24: qty 1

## 2024-06-24 NOTE — ED Triage Notes (Signed)
 Centralized chest wall pain, non radiating, started this AM with some associated SHOB. Denies n/v/d/cough. Also with chronic right knee pain.  Increased with movement, hurts to touch.

## 2024-06-24 NOTE — ED Provider Triage Note (Signed)
 Emergency Medicine Provider Triage Evaluation Note  [name] , a 58 y.o. female  was evaluated in triage.  Pt complains of chest pain, knee pain.  The patient states that she woke up this morning with left-sided chest wall pain, denies any rash.  Pain is sharp, worse with movement and palpation.  No shortness of breath, cough, fever or chills.  No ripping or tearing pain, no radiation to the back.  She also endorses right knee pain, denies any falls or trauma.  Review of Systems  Positive: Chest pain, knee pain Negative: Shortness of breath, cough, fever, chills  Physical Exam  BP (!) 145/102 (BP Location: Right Arm)   Pulse 79   Temp 98.2 F (36.8 C) (Oral)   Resp 19   Ht 5' 4 (1.626 m)   Wt 108 kg   SpO2 99%   BMI 40.87 kg/m  Gen:   Awake, no distress   Resp:  Normal effort, lungs CTAB Chest wall: Tenderness to palpation of the left chest wall that reproduces the patient's pain, no rash MSK:   Moves extremities without difficulty, mild bilateral tenderness of the right knee   Medical Decision Making  Medically screening exam initiated at 7:37 PM.  Appropriate orders placed.  [name] was informed that the remainder of the evaluation will be completed by another provider, this initial triage assessment does not replace that evaluation, and the importance of remaining in the ED until their evaluation is complete.  Will obtain x-ray imaging of the right knee, cardiac workup initiated to include EKG, chest x-ray, laboratory evaluation.  Patient informed of the wait.   Jerrol Agent, MD 06/24/24 [phone]

## 2024-06-25 MED ORDER — ACETAMINOPHEN 500 MG PO TABS
1000.0000 mg | ORAL_TABLET | Freq: Once | ORAL | Status: AC
  Administered 2024-06-25: 1000 mg via ORAL
  Filled 2024-06-25: qty 2

## 2024-06-25 MED ORDER — POTASSIUM CHLORIDE CRYS ER 20 MEQ PO TBCR
40.0000 meq | EXTENDED_RELEASE_TABLET | Freq: Once | ORAL | Status: AC
  Administered 2024-06-25: 40 meq via ORAL
  Filled 2024-06-25: qty 2

## 2024-06-25 MED ORDER — POTASSIUM CHLORIDE CRYS ER 20 MEQ PO TBCR: 20.0000 meq | EXTENDED_RELEASE_TABLET | Freq: Every day | ORAL | 0 refills | Status: AC

## 2024-06-25 NOTE — Discharge Instructions (Signed)
 Make an appointment to have close follow-up with your primary care doctor.  Your potassium needs to be rechecked.  Make sure that you are taking your potassium supplement.  Return to the emergency room if you have any worsening symptoms.

## 2024-06-25 NOTE — ED Provider Notes (Addendum)
 [place] [facility] DEPARTMENT AT [facility] Provider Note   CSN: [id number] Arrival date & time: 06/24/24  1854     Patient presents with: Chest Pain   [name] is a 58 y.o. female.   Patient is a 58 year old who presents with chest pain.  She has a history of hypertension, asthma, GERD.  She reports that she woke up yesterday morning with some chest pain.  It is in her left chest.  It is worse with movements and movement of her left arm.  She said she had a little bit of shortness of breath with it.  She went to work and she got a little bit lightheaded and went home.  She said the chest has been ongoing since yesterday.  She denies any cough or cold symptoms.  No nausea or vomiting.  No associate abdominal pain.  The pain does not radiate.  No pleuritic pain.  No ongoing shortness of breath.  No leg pain or swelling other than she has pain in her right knee which is a chronic issue for her.  No recent injuries to her knee.       Prior to Admission medications   Medication Sig Start Date End Date Taking? Authorizing Provider  acetaminophen -codeine  (TYLENOL  #3) 300-30 MG tablet Take 1 tablet by mouth every 4 (four) hours as needed for moderate pain. 03/24/21   Gretta Bertrum ORN, PA-C  albuterol  (PROVENTIL ) (2.5 MG/3ML) 0.083% nebulizer solution Take 3 mLs (2.5 mg total) by nebulization every 6 (six) hours as needed for wheezing or shortness of breath. 12/12/23   Newlin, Enobong, MD  albuterol  (VENTOLIN  HFA) 108 (90 Base) MCG/ACT inhaler Inhale 2 puffs into the lungs every 6 (six) hours as needed for wheezing or shortness of breath. 12/12/23   Newlin, Enobong, MD  amLODipine  (NORVASC ) 5 MG tablet Take 1 tablet (5 mg total) by mouth daily. 12/15/23 12/14/24  Norleen Lynwood ORN, MD  atorvastatin  (LIPITOR) 40 MG tablet Take 1 tablet (40 mg total) by mouth daily. 06/21/23   Newlin, Enobong, MD  benzonatate  (TESSALON ) 100 MG capsule Take 1 capsule (100 mg total) by mouth 3 (three) times daily  as needed for cough. Patient not taking: Reported on 10/19/2022 03/16/22   Gladis Elsie BROCKS, PA-C  Blood Pressure KIT Use as instructed Dx: Hypertension 07/15/19   Newlin, Enobong, MD  budesonide -formoterol  (SYMBICORT ) 160-4.5 MCG/ACT inhaler Inhale 2 puffs into the lungs 2 (two) times daily. 12/12/23   Newlin, Enobong, MD  cetirizine  (ZYRTEC ) 10 MG tablet Take 1 tablet (10 mg total) by mouth daily. 04/20/22   Newlin, Enobong, MD  cyclobenzaprine  (FLEXERIL ) 10 MG tablet Take 1 tablet (10 mg total) by mouth 2 (two) times daily as needed for muscle spasms. 01/31/24   Nivia Colon, PA-C  diclofenac  Sodium (VOLTAREN ) 1 % GEL Apply 2 g topically 4 (four) times daily. 10/28/21   McClung, Angela M, PA-C  ibuprofen  (ADVIL ) 600 MG tablet Take 1 tablet (600 mg total) by mouth every 6 (six) hours as needed. 01/31/24   Tran, Bowie, PA-C  ipratropium (ATROVENT ) 0.03 % nasal spray Place 2 sprays into both nostrils every 12 (twelve) hours. 04/20/22   Newlin, Enobong, MD  lisinopril (ZESTRIL) 10 MG tablet Take 10 mg by mouth daily. 12/14/23   [provider]  Misc. Devices MISC Blood Pressure Monitor 04/14/23   Delbert Clam, MD  Misc. Devices MISC Nebulizer device. Diagnosis - Asthma 06/20/23   Delbert Clam, MD  ondansetron  (ZOFRAN -ODT) 4 MG disintegrating tablet Take  1 tablet (4 mg total) by mouth every 6 (six) hours as needed for nausea or vomiting. 06/08/22   Lamptey, Aleene KIDD, MD  potassium chloride  SA (KLOR-CON  M) 20 MEQ tablet Take 1 tablet (20 mEq total) by mouth daily. 06/21/23   Newlin, Enobong, MD  potassium chloride  SA (KLOR-CON  M) 20 MEQ tablet Take 2 tablets (40 mEq total) by mouth daily. 01/01/24   Haze Lonni PARAS, MD  predniSONE  (DELTASONE ) 20 MG tablet 3 tabs po day one, then 2 tabs daily x 4 days 01/31/24   Nivia Colon, PA-C  promethazine  (PHENERGAN ) 25 MG tablet Take 1 tablet (25 mg total) by mouth every 8 (eight) hours as needed for nausea or vomiting. 06/08/22   Newlin, Enobong, MD   SUMAtriptan  (IMITREX ) 50 MG tablet Take 1 tablet (50 mg total) by mouth every 2 (two) hours as needed for migraine. May repeat in 2 hours if headache persists or recurs. 10/19/22   Mayers, Cari S, PA-C  venlafaxine  XR (EFFEXOR  XR) 75 MG 24 hr capsule Take 1 capsule (75 mg total) by mouth daily with breakfast. 06/20/23   Delbert Clam, MD  Vitamin D , Ergocalciferol , (DRISDOL ) 1.25 MG (50000 UNIT) CAPS capsule Take 1 capsule (50,000 Units total) by mouth every 7 (seven) days. 10/20/22   Mayers, Cari S, PA-C  fluticasone  (FLONASE ) 50 MCG/ACT nasal spray Place 2 sprays into both nostrils daily. 07/15/19 04/01/20  Delbert Clam, MD    Allergies: Tramadol     Review of Systems  Constitutional:  Negative for chills, diaphoresis, fatigue and fever.  HENT:  Negative for congestion, rhinorrhea and sneezing.   Eyes: Negative.   Respiratory:  Positive for chest tightness and shortness of breath. Negative for cough.   Cardiovascular:  Positive for chest pain. Negative for leg swelling.  Gastrointestinal:  Negative for abdominal pain, diarrhea, nausea and vomiting.  Genitourinary:  Negative for difficulty urinating, flank pain and frequency.  Musculoskeletal:  Positive for arthralgias. Negative for back pain.  Skin:  Negative for rash.  Neurological:  Negative for dizziness, speech difficulty, weakness, numbness and headaches.    Updated Vital Signs BP (!) 141/79 (BP Location: Right Arm)   Pulse 62   Temp 97.9 F (36.6 C) (Oral)   Resp 17   Ht 5' 4 (1.626 m)   Wt 108 kg   SpO2 100%   BMI 40.87 kg/m   Physical Exam Constitutional:      Appearance: She is well-developed.  HENT:     Head: Normocephalic and atraumatic.  Eyes:     Pupils: Pupils are equal, round, and reactive to light.  Cardiovascular:     Rate and Rhythm: Normal rate and regular rhythm.     Heart sounds: Normal heart sounds.  Pulmonary:     Effort: Pulmonary effort is normal. No respiratory distress.     Breath sounds:  Normal breath sounds. No wheezing or rales.  Chest:     Chest wall: Tenderness (Positive tenderness to the left chest wall, no crepitus or deformity) present.  Abdominal:     General: Bowel sounds are normal.     Palpations: Abdomen is soft.     Tenderness: There is no abdominal tenderness. There is no guarding or rebound.  Musculoskeletal:        General: Normal range of motion.     Cervical back: Normal range of motion and neck supple.     Comments: Nonpitting edema to lower extremities bilaterally.  Pedal pulses are intact.  No calf tenderness.  She  has some generalized tenderness to the right knee.  No warmth or erythema.  No swelling or deformity to the knee.  Lymphadenopathy:     Cervical: No cervical adenopathy.  Skin:    General: Skin is warm and dry.     Findings: No rash.  Neurological:     Mental Status: She is alert and oriented to person, place, and time.     (all labs ordered are listed, but only abnormal results are displayed) Labs Reviewed  BASIC METABOLIC PANEL WITH GFR - Abnormal; Notable for the following components:      Result Value   Potassium 2.9 (*)    Glucose, Bld 106 (*)    All other components within normal limits  CBC  TROPONIN I (HIGH SENSITIVITY)  TROPONIN I (HIGH SENSITIVITY)    EKG: EKG Interpretation Date/Time:  Tuesday June 25 2024 08:22:33 EDT Ventricular Rate:  63 PR Interval:  185 QRS Duration:  105 QT Interval:  452 QTC Calculation: 463 R Axis:   -18  Text Interpretation: Sinus rhythm Left ventricular hypertrophy since last tracing no significant change Confirmed by Lenor Hollering [phone]) on 06/25/2024 8:44:05 AM  Radiology: [facility] Knee 2 Views Right Result Date: 06/24/2024 CLINICAL DATA:  Chronic knee pain. EXAM: RIGHT KNEE - 1-2 VIEW COMPARISON:  01/31/2024 FINDINGS: Medial tibiofemoral joint space narrowing. Mild to moderate tricompartmental peripheral spurring. No fracture, erosion, or focal bone abnormality. Minimal joint  effusion. Unremarkable soft tissues. IMPRESSION: Mild to moderate tricompartmental osteoarthritis, most prominent in the medial tibiofemoral compartment. Electronically Signed   By: Hollering Gasman M.D.   On: 06/24/2024 20:27   DG Chest 2 View Result Date: 06/24/2024 CLINICAL DATA:  Chest pain. EXAM: CHEST - 2 VIEW COMPARISON:  12/31/2023 FINDINGS: Lung volumes are low. Prominent heart size, stable from prior exam. No focal airspace disease, pulmonary edema, pleural effusion or pneumothorax. Stable osseous structures. IMPRESSION: Low lung volumes without acute abnormality. Electronically Signed   By: Hollering Gasman M.D.   On: 06/24/2024 20:26     Procedures   Medications Ordered in the ED  lidocaine  (LIDODERM ) 5 % 1 patch (1 patch Transdermal Patch Removed 06/25/24 0817)  ibuprofen  (ADVIL ) tablet 600 mg (600 mg Oral Given 06/24/24 1945)  acetaminophen  (TYLENOL ) tablet 1,000 mg (has no administration in time range)                                    Medical Decision Making Amount and/or Complexity of Data Reviewed Labs: ordered. Radiology: ordered.  Risk OTC drugs. Prescription drug management.   This patient presents to the ED for concern of chest pain, this involves an extensive number of treatment options, and is a complaint that carries with it a high risk of complications and morbidity.  I considered the following differential and admission for this acute, potentially life threatening condition.  The differential diagnosis includes ACS, pneumonia, pneumothorax, pulmonary edema, musculoskeletal pain, PE, aortic dissection  MDM:    Patient is a 58 year old who presents with pain in her chest since yesterday morning.  It is worse with certain movements although not worse with exertion.  She does not have associated symptoms other than she said she had a little bit of shortness of breath earlier but denies any currently.  Her lungs are clear without any wheezing or rails.  Chest  x-ray does not show any evidence of pneumonia, pulmonary edema, pneumothorax or other acute abnormality.  She does not have symptoms that would be more concerning for PE or aortic dissection.  Her pain is reproducible with palpation of her left chest wall.  Suspect this is musculoskeletal.  Will treat with Tylenol .  Advised her to have close follow-up with her PCP.  Her potassium is noted to be low.  It is been low in the past.  Advised her she will need to have this rechecked by her PCP.  She has some pain in her right knee.  She has had this chronically.  She has a history of osteoarthritis.  X-ray reveals osteoarthritic changes.  She does not have any swelling.  No warmth or concerns for infection.  No visible swelling of the knee or leg is noted.  No clinical concerns for DVT.  (Labs, imaging, consults)  Labs: I Ordered, and personally interpreted labs.  The pertinent results include: Hypokalemia  Imaging Studies ordered: I ordered imaging studies including chest x-ray, right knee I independently visualized and interpreted imaging. I agree with the radiologist interpretation  Additional history obtained from chart.  External records from outside source obtained and reviewed including prior notes  Cardiac Monitoring: The patient was maintained on a cardiac monitor.  If on the cardiac monitor, I personally viewed and interpreted the cardiac monitored which showed an underlying rhythm of: Sinus rhythm  Reevaluation: After the interventions noted above, I reevaluated the patient and found that they have :improved  Social Determinants of Health:    Disposition: Discharged to home  Co morbidities that complicate the patient evaluation  Past Medical History:  Diagnosis Date   Asthma    Gastric reflux    History of blood transfusion yrs agio, none recent   Hypertension    Osteoarthritis    Sleep apnea    cpap machine has not used since 2014 due to machine/tubing in bad shape     Syncope and collapse yrs ago     Medicines Meds ordered this encounter  Medications   lidocaine  (LIDODERM ) 5 % 1 patch   ibuprofen  (ADVIL ) tablet 600 mg   acetaminophen  (TYLENOL ) tablet 1,000 mg   potassium chloride  SA (KLOR-CON  M) CR tablet 40 mEq   potassium chloride  SA (KLOR-CON  M) 20 MEQ tablet    Sig: Take 1 tablet (20 mEq total) by mouth daily.    Dispense:  3 tablet    Refill:  0    I have reviewed the patients home medicines and have made adjustments as needed  Problem List / ED Course: Problem List Items Addressed This Visit   None Visit Diagnoses       Nonspecific chest pain    -  Primary     Hypokalemia                    Final diagnoses:  None    ED Discharge Orders     None          Lenor Hollering, MD 06/25/24 9046    Lenor Hollering, MD 06/25/24 1056

## 2024-06-25 NOTE — ED Notes (Addendum)
 Pt returned from ambulating to the bathroom. Reconnected to the monitor. Previous BP cuff switched from the larger cuff to a smaller cuff placed on her right forearm to fit more properly.

## 2024-07-16 ENCOUNTER — Telehealth: Payer: Self-pay | Admitting: *Deleted

## 2024-07-16 NOTE — Telephone Encounter (Signed)
 Copied from CRM #[id number]. Topic: General - Other >> Jul 16, 2024  4:18 PM Delon DASEN wrote: Reason for CRM: Patient needs form filled out for [employer] for transportation, can she just drop it off or need an appt- please call sister Cinderella [phone]

## 2024-07-17 ENCOUNTER — Telehealth: Payer: Self-pay | Admitting: Family Medicine

## 2024-07-17 NOTE — Telephone Encounter (Signed)
 I spoke to patient's  foster sister , Grayce who said that she has completed Part A of [employer] application and she will ask the patient to bring it to me a the clinic this afternoon if possible, otherwise, tomorrow.  Grayce was able to address my questions regarding the patient's need for [employer].  Will await for Part A and then submit the entire application to [employer]

## 2024-07-17 NOTE — Telephone Encounter (Unsigned)
 Copied from CRM #[id number]. Topic: General - Other >> Jul 17, 2024 11:04 AM Tiffini S wrote: Reason for CRM: Grayce had a missed call- called back asking to talk with Slater. Called CAL, will have nurse call the patient sister back. She is with a patient at this time.

## 2024-07-17 NOTE — Telephone Encounter (Signed)
 Pt drop off forms.[employer] [place]

## 2024-07-17 NOTE — Telephone Encounter (Signed)
 Call returned to patient's sister, Grayce  [phone] , message left with call back requested.

## 2024-07-17 NOTE — Telephone Encounter (Signed)
 Patient sister is wanting to have form dropped off to be signed by PCP.   She states sister has health condition that doesn't permit her to walk long distance and is unable to ride city bus and does need [employer].   Will await call from Slater, RN CM

## 2024-07-17 NOTE — Telephone Encounter (Signed)
 I called [name] back and she was wondering if the patient has been using [employer].   I told her that I do not know if she has been using the service.  She would need to contact [employer] ([employer])

## 2024-07-17 NOTE — Telephone Encounter (Signed)
 Copied from CRM #[id number]. Topic: Clinical - Medical Advice >> Jul 17, 2024  3:46 PM Anairis L wrote: Reason for CRM: Grayce , Mrs Lakyn sister requesting a call back from Nurse Slater.She has a follow up question from phone call.

## 2024-07-17 NOTE — Telephone Encounter (Signed)
 Duplicate message.   Call with Grayce documented in another telephone encounter today

## 2024-07-17 NOTE — Telephone Encounter (Signed)
 Patient's sister Grayce returning call back, Please Advise.   Robin's Phone Number: [phone]

## 2024-07-18 NOTE — Telephone Encounter (Signed)
 I have spoken to the patient and the encounter is documented in another telephone encounter today

## 2024-07-18 NOTE — Telephone Encounter (Unsigned)
 Copied from CRM #[id number]. Topic: Clinical - Medical Advice >> Jul 17, 2024  3:46 PM [name] wrote: Reason for CRM: [name] , Mrs Marguerette sister requesting a call back from Nurse Slater.She has a follow up question from phone call. >> Jul 18, 2024 11:59 AM [name] wrote: Pt and her sister [name] returning the call to Slater as she instructed them to call right back once [name] was on the line. CAL has requested they call back in 20 min as Slater is in a exam room with a pt.

## 2024-07-18 NOTE — Telephone Encounter (Signed)
 I spoke to the patient and obtained verbal authorization to release medical information to [employer]. The completed [employer] application was then emailed to [employer].

## 2024-07-18 NOTE — Telephone Encounter (Signed)
 Received Part A of [employer] application but patient did not sign the release of medical information. I called her in attempt to obtain a verbal authorization and had to leave a message requesting a call back.

## 2024-07-18 NOTE — Telephone Encounter (Signed)
 Patient returned the call, Slater spoke with the patient regarding the [employer] application.

## 2024-08-02 ENCOUNTER — Telehealth: Payer: Self-pay | Admitting: Family Medicine

## 2024-08-02 NOTE — Telephone Encounter (Signed)
 Copied from CRM [id number]. Topic: General - Call Back - No Documentation >> Aug 02, 2024  9:28 AM Ivette P wrote:  Reason for CRM: Pt called in stating social worker called her yesterday, do not see any notes. Pt will callback with a name.  >> Aug 02, 2024  9:36 AM Montie POUR wrote: Tenise is calling back. She had a voicemail from Slater Diesel, Charity fundraiser. Please RN Brazeau call Chriselda back at [phone]. Thanks

## 2024-08-06 NOTE — Telephone Encounter (Signed)
 Call returned to patient.  She explained that she was calling to see when she could ride [employer] and she then said that she was told she could ride.  She confirmed that she was approved for services with [employer].

## 2024-08-12 ENCOUNTER — Encounter: Payer: Self-pay | Admitting: Radiology

## 2024-08-27 ENCOUNTER — Ambulatory Visit: Admitting: Family Medicine

## 2024-08-30 ENCOUNTER — Emergency Department (HOSPITAL_COMMUNITY)

## 2024-08-30 ENCOUNTER — Encounter (HOSPITAL_COMMUNITY): Payer: Self-pay

## 2024-08-30 ENCOUNTER — Emergency Department (HOSPITAL_COMMUNITY)
Admission: EM | Admit: 2024-08-30 | Discharge: 2024-08-30 | Disposition: A | Discharge status: Home / Self Care | Attending: Emergency Medicine | Admitting: Emergency Medicine

## 2024-08-30 DIAGNOSIS — Z7951 Long term (current) use of inhaled steroids: Secondary | ICD-10-CM | POA: Insufficient documentation

## 2024-08-30 DIAGNOSIS — M25561 Pain in right knee: Secondary | ICD-10-CM | POA: Insufficient documentation

## 2024-08-30 DIAGNOSIS — I10 Essential (primary) hypertension: Secondary | ICD-10-CM | POA: Insufficient documentation

## 2024-08-30 DIAGNOSIS — M7989 Other specified soft tissue disorders: Secondary | ICD-10-CM | POA: Insufficient documentation

## 2024-08-30 DIAGNOSIS — Z79899 Other long term (current) drug therapy: Secondary | ICD-10-CM | POA: Insufficient documentation

## 2024-08-30 DIAGNOSIS — J45909 Unspecified asthma, uncomplicated: Secondary | ICD-10-CM | POA: Diagnosis not present

## 2024-08-30 MED ORDER — HYDROCODONE-ACETAMINOPHEN 5-325 MG PO TABS
1.0000 | ORAL_TABLET | Freq: Once | ORAL | Status: AC
  Administered 2024-08-30: 1 via ORAL
  Filled 2024-08-30: qty 1

## 2024-08-30 MED ORDER — PREDNISONE 10 MG PO TABS: 40.0000 mg | ORAL_TABLET | Freq: Every day | ORAL | 0 refills | Status: DC

## 2024-08-30 NOTE — ED Provider Notes (Signed)
 [place] [facility]ERGENC[facility] DEPARTMENT AT [facility] Provider Note   CSN: [id number] Arrival date & time: 08/30/24  8164     Patient presents with: Knee Pain   [name] is a 58 y.o. female.   Patient with complaint of right knee pain and some swelling 3 weeks.  No fall or injury.  Patient not followed by orthopedics.  Patient's had pain in that knee in the past.  Never really had it evaluated.  Temp 97.6 pulse 73 respirations 18 blood pressure 153/83 oxygen  sats 95% on room air.  Patient not on any chronic pain meds.  Past medical history sniffer hypertension and asthma osteoarthritis.  Patient's had her gallbladder removed vaginal hysterectomy cardiac catheterization in 2023.       Prior to Admission medications   Medication Sig Start Date End Date Taking? Authorizing Provider  predniSONE  (DELTASONE ) 10 MG tablet Take 4 tablets (40 mg total) by mouth daily. 08/30/24  Yes Azana Kiesler, MD  acetaminophen -codeine  (TYLENOL  #3) 300-30 MG tablet Take 1 tablet by mouth every 4 (four) hours as needed for moderate pain. 03/24/21   Gretta Bertrum ORN, PA-C  albuterol  (PROVENTIL ) (2.5 MG/3ML) 0.083% nebulizer solution Take 3 mLs (2.5 mg total) by nebulization every 6 (six) hours as needed for wheezing or shortness of breath. 12/12/23   Newlin, Enobong, MD  albuterol  (VENTOLIN  HFA) 108 (90 Base) MCG/ACT inhaler Inhale 2 puffs into the lungs every 6 (six) hours as needed for wheezing or shortness of breath. 12/12/23   Newlin, Enobong, MD  amLODipine  (NORVASC ) 5 MG tablet Take 1 tablet (5 mg total) by mouth daily. 12/15/23 12/14/24  Norleen Lynwood ORN, MD  atorvastatin  (LIPITOR) 40 MG tablet Take 1 tablet (40 mg total) by mouth daily. 06/21/23   Newlin, Enobong, MD  benzonatate  (TESSALON ) 100 MG capsule Take 1 capsule (100 mg total) by mouth 3 (three) times daily as needed for cough. Patient not taking: Reported on 10/19/2022 03/16/22   Gladis Elsie BROCKS, PA-C  Blood Pressure KIT Use as instructed Dx:  Hypertension 07/15/19   Newlin, Enobong, MD  budesonide -formoterol  (SYMBICORT ) 160-4.5 MCG/ACT inhaler Inhale 2 puffs into the lungs 2 (two) times daily. 12/12/23   Newlin, Enobong, MD  cetirizine  (ZYRTEC ) 10 MG tablet Take 1 tablet (10 mg total) by mouth daily. 04/20/22   Newlin, Enobong, MD  cyclobenzaprine  (FLEXERIL ) 10 MG tablet Take 1 tablet (10 mg total) by mouth 2 (two) times daily as needed for muscle spasms. 01/31/24   Nivia Colon, PA-C  diclofenac  Sodium (VOLTAREN ) 1 % GEL Apply 2 g topically 4 (four) times daily. 10/28/21   McClung, Angela M, PA-C  ibuprofen  (ADVIL ) 600 MG tablet Take 1 tablet (600 mg total) by mouth every 6 (six) hours as needed. 01/31/24   Tran, Bowie, PA-C  ipratropium (ATROVENT ) 0.03 % nasal spray Place 2 sprays into both nostrils every 12 (twelve) hours. 04/20/22   Newlin, Enobong, MD  lisinopril (ZESTRIL) 10 MG tablet Take 10 mg by mouth daily. 12/14/23   [provider]  Misc. Devices MISC Blood Pressure Monitor 04/14/23   Delbert Clam, MD  Misc. Devices MISC Nebulizer device. Diagnosis - Asthma 06/20/23   Delbert Clam, MD  ondansetron  (ZOFRAN -ODT) 4 MG disintegrating tablet Take 1 tablet (4 mg total) by mouth every 6 (six) hours as needed for nausea or vomiting. 06/08/22   Lamptey, Aleene KIDD, MD  potassium chloride  SA (KLOR-CON  M) 20 MEQ tablet Take 1 tablet (20 mEq total) by mouth daily. 06/25/24   Lenor Hollering,  MD  predniSONE  (DELTASONE ) 20 MG tablet 3 tabs po day one, then 2 tabs daily x 4 days 01/31/24   Nivia Colon, PA-C  promethazine  (PHENERGAN ) 25 MG tablet Take 1 tablet (25 mg total) by mouth every 8 (eight) hours as needed for nausea or vomiting. 06/08/22   Newlin, Enobong, MD  SUMAtriptan  (IMITREX ) 50 MG tablet Take 1 tablet (50 mg total) by mouth every 2 (two) hours as needed for migraine. May repeat in 2 hours if headache persists or recurs. 10/19/22   Mayers, Cari S, PA-C  venlafaxine  XR (EFFEXOR  XR) 75 MG 24 hr capsule Take 1 capsule (75 mg total) by  mouth daily with breakfast. 06/20/23   Delbert Clam, MD  Vitamin D , Ergocalciferol , (DRISDOL ) 1.25 MG (50000 UNIT) CAPS capsule Take 1 capsule (50,000 Units total) by mouth every 7 (seven) days. 10/20/22   Mayers, Cari S, PA-C  fluticasone  (FLONASE ) 50 MCG/ACT nasal spray Place 2 sprays into both nostrils daily. 07/15/19 04/01/20  Delbert Clam, MD    Allergies: Tramadol     Review of Systems  Constitutional:  Negative for chills and fever.  HENT:  Negative for ear pain and sore throat.   Eyes:  Negative for pain and visual disturbance.  Respiratory:  Negative for cough and shortness of breath.   Cardiovascular:  Negative for chest pain and palpitations.  Gastrointestinal:  Negative for abdominal pain and vomiting.  Genitourinary:  Negative for dysuria and hematuria.  Musculoskeletal:  Positive for joint swelling. Negative for arthralgias and back pain.  Skin:  Negative for color change and rash.  Neurological:  Negative for seizures and syncope.  All other systems reviewed and are negative.   Updated Vital Signs BP (!) 153/83 (BP Location: Right Arm)   Pulse 73   Temp 97.6 F (36.4 C)   Resp 18   Ht 1.626 m (5' 4)   SpO2 95%   BMI 40.87 kg/m   Physical Exam Vitals and nursing note reviewed.  Constitutional:      General: She is not in acute distress.    Appearance: Normal appearance. She is well-developed.  HENT:     Head: Normocephalic and atraumatic.  Eyes:     Extraocular Movements: Extraocular movements intact.     Conjunctiva/sclera: Conjunctivae normal.     Pupils: Pupils are equal, round, and reactive to light.  Cardiovascular:     Rate and Rhythm: Normal rate and regular rhythm.     Heart sounds: No murmur heard. Pulmonary:     Effort: Pulmonary effort is normal. No respiratory distress.     Breath sounds: Normal breath sounds.  Abdominal:     Palpations: Abdomen is soft.     Tenderness: There is no abdominal tenderness.  Musculoskeletal:        General:  Swelling and tenderness present.     Cervical back: Normal range of motion and neck supple. No rigidity.     Right lower leg: No edema.     Left lower leg: No edema.     Comments: Mild swelling to the right knee slight tenderness to palpation no significant effusion.  Distally neurovascularly intact with good cap refill and dorsalis pedis pulses 1+.  Skin:    General: Skin is warm and dry.     Capillary Refill: Capillary refill takes less than 2 seconds.  Neurological:     General: No focal deficit present.     Mental Status: She is alert and oriented to person, place, and time.  Psychiatric:  Mood and Affect: Mood normal.     (all labs ordered are listed, but only abnormal results are displayed) Labs Reviewed - No data to display  EKG: None  Radiology: DG Knee Complete 4 Views Right Result Date: 08/30/2024 CLINICAL DATA:  Pain and swelling EXAM: RIGHT KNEE - COMPLETE 4+ VIEW COMPARISON:  Right knee x-ray 06/25/2024 FINDINGS: Again seen are moderate degenerative changes of the lateral compartment with joint space narrowing and osteophyte formation. Minimal osteophytes are seen in the patellofemoral compartment. There is no acute fracture or dislocation. There is no significant joint effusion or soft tissue abnormality. IMPRESSION: 1. No acute fracture or dislocation. 2. Moderate degenerative changes of the lateral compartment. Electronically Signed   By: Greig Pique M.D.   On: 08/30/2024 19:26     Procedures   Medications Ordered in the ED  HYDROcodone -acetaminophen  (NORCO/VICODIN) 5-325 MG per tablet 1 tablet (has no administration in time range)                                    Medical Decision Making Risk Prescription drug management.   X-ray of the right knee no acute fracture or dislocation moderate degenerative changes of the lateral compartment.  Feel that this is an inflammatory arthritis.  Will treat with prednisone .  Dose of hydrocodone  here tonight.   Have her follow-up with orthopedics and a knee immobilizer.  Final diagnoses:  Acute pain of right knee    ED Discharge Orders          Ordered    predniSONE  (DELTASONE ) 10 MG tablet  Daily        08/30/24 2207               Geraldene Hamilton, MD 08/30/24 2210

## 2024-08-30 NOTE — Progress Notes (Signed)
 Orthopedic Tech Progress Note Patient Details:  [name] [birth year] [id number]  Ortho Devices Type of Ortho Device: Knee Immobilizer Ortho Device/Splint Location: RLE Ortho Device/Splint Interventions: Ordered, Application, Adjustment   Post Interventions Patient Tolerated: Well Instructions Provided: Care of device  [name] Pac 08/30/2024, 10:48 PM

## 2024-08-30 NOTE — ED Triage Notes (Signed)
 Pt c/o R knee pain and swelling x3-4 weeks.  Sts symptoms get worse with extended standing.  Pt reports knee intermittently gets weak.  Denies injury.

## 2024-08-30 NOTE — ED Provider Triage Note (Signed)
 Emergency Medicine Provider Triage Evaluation Note  [name] , a 58 y.o. female  was evaluated in triage.  Pt complains of right knee pain 3 to 4 weeks.  Review of Systems  Positive: Right knee pain, swelling Negative: Fever, chills, nausea, vomiting, injury, trauma  Physical Exam  BP (!) 153/83 (BP Location: Right Arm)   Pulse 73   Temp 97.6 F (36.4 C)   Resp 18   Ht 5' 4 (1.626 m)   SpO2 95%   BMI 40.87 kg/m  Gen:   Awake, no distress   Resp:  Normal effort  MSK:   Moves extremities without difficulty  Other:  Unable to assess needs due to tight jeans pants  Medical Decision Making  Medically screening exam initiated at 7:03 PM.  Appropriate orders placed.  [name] was informed that the remainder of the evaluation will be completed by another provider, this initial triage assessment does not replace that evaluation, and the importance of remaining in the ED until their evaluation is complete.  Imaging ordered   [name] 08/30/24 8095

## 2024-08-30 NOTE — ED Triage Notes (Signed)
 Pt c/o R knee pain for the last 2-3 weeks. Pt denies known injury. Pt ambulatory to triage.

## 2024-08-30 NOTE — Discharge Instructions (Signed)
 Wear the knee immobilizer at all times for comfort.  You can remove it to shower.  Make an appointment to follow-up with orthopedics give their office a call on Monday.  Take the prednisone  as directed for the next 5 days starting tomorrow.

## 2024-09-16 ENCOUNTER — Ambulatory Visit: Admitting: Physician Assistant

## 2024-10-23 ENCOUNTER — Encounter (HOSPITAL_COMMUNITY): Payer: Self-pay

## 2024-10-23 ENCOUNTER — Ambulatory Visit (HOSPITAL_COMMUNITY): Admission: EM | Admit: 2024-10-23 | Discharge: 2024-10-23 | Disposition: A | Discharge status: Home / Self Care

## 2024-10-23 DIAGNOSIS — J0110 Acute frontal sinusitis, unspecified: Secondary | ICD-10-CM | POA: Diagnosis not present

## 2024-10-23 DIAGNOSIS — J4530 Mild persistent asthma, uncomplicated: Secondary | ICD-10-CM

## 2024-10-23 DIAGNOSIS — J069 Acute upper respiratory infection, unspecified: Secondary | ICD-10-CM | POA: Diagnosis not present

## 2024-10-23 LAB — POC SOFIA SARS ANTIGEN FIA: SARS Coronavirus 2 Ag: NEGATIVE

## 2024-10-23 LAB — POCT INFLUENZA A/B
Influenza A, POC: NEGATIVE
Influenza B, POC: NEGATIVE

## 2024-10-23 MED ORDER — PREDNISONE 20 MG PO TABS: 40.0000 mg | ORAL_TABLET | Freq: Every day | ORAL | 0 refills | Status: AC

## 2024-10-23 MED ORDER — ALBUTEROL SULFATE HFA 108 (90 BASE) MCG/ACT IN AERS: 2.0000 | INHALATION_SPRAY | Freq: Four times a day (QID) | RESPIRATORY_TRACT | 3 refills | Status: AC | PRN

## 2024-10-23 MED ORDER — AZELASTINE HCL 0.1 % NA SOLN: 1.0000 | Freq: Two times a day (BID) | NASAL | 0 refills | Status: AC

## 2024-10-23 MED ORDER — AMOXICILLIN-POT CLAVULANATE 875-125 MG PO TABS: 1.0000 | ORAL_TABLET | Freq: Two times a day (BID) | ORAL | 0 refills | Status: AC

## 2024-10-23 NOTE — ED Provider Notes (Signed)
 " [facility]  Note:  This document was prepared using Dragon voice recognition software and may include unintentional dictation errors.  MRN: [id number] DOB: 1965/11/08  Subjective:   [name] is a 59 y.o. female presenting for weakness, fatigue, wheezing, nasal congestion, decreased appetite and headache off and on x 2 weeks.  Patient reports history of asthma and is currently out of albuterol  inhaler.  Patient denies any known sick contacts but does work in [employer] and is exposed to customers and coworkers regularly.  Patient has been taking over-the-counter cough and cold medication and Robitussin with minimal improvement.  No shortness of breath, chest pain, weakness, dizziness, fever.  Current Medications[1]   Allergies[2]  Past Medical History:  Diagnosis Date   Asthma    Gastric reflux    History of blood transfusion yrs agio, none recent   Hypertension    Osteoarthritis    Sleep apnea    cpap machine has not used since 2014 due to machine/tubing in bad shape    Syncope and collapse yrs ago     Past Surgical History:  Procedure Laterality Date   CARDIAC CATHETERIZATION     more than 5 years ago   CARDIAC CATHETERIZATION  06-07-2012   [facility]: Normal coronary arteries. No significant LVOT/aortic valve gradient   CHOLECYSTECTOMY     COLONOSCOPY WITH PROPOFOL  N/A 02/27/2014   Procedure: COLONOSCOPY WITH PROPOFOL ;  Surgeon: Jenna MARLA Louder, MD;  Location: [facility];  Service: Endoscopy;  Laterality: N/A;   ESOPHAGOGASTRODUODENOSCOPY (EGD) WITH PROPOFOL  N/A 02/27/2014   Procedure: ESOPHAGOGASTRODUODENOSCOPY (EGD) WITH PROPOFOL ;  Surgeon: Jenna MARLA Louder, MD;  Location: [facility];  Service: Endoscopy;  Laterality: N/A;   EXPLORATORY LAPAROTOMY  09/2001   ELAP, LOA, R partial oophorectomy, repair of bowel injury   VAGINAL HYSTERECTOMY  2001   Adnexa left in place    Family History  Family history unknown: Yes    Social  History[3]  ROS Refer to HPI for ROS details.  Objective:    Vitals: BP 136/79 (BP Location: Left Arm)   Pulse 67   Temp 99 F (37.2 C) (Oral)   Resp 16   SpO2 94%   Physical Exam Vitals and nursing note reviewed.  Constitutional:      General: She is not in acute distress.    Appearance: She is well-developed. She is not ill-appearing or toxic-appearing.  HENT:     Head: Normocephalic and atraumatic.     Nose: Congestion present. No rhinorrhea.     Mouth/Throat:     Mouth: Mucous membranes are moist.     Pharynx: Oropharynx is clear.  Cardiovascular:     Rate and Rhythm: Normal rate.  Pulmonary:     Effort: Pulmonary effort is normal. No respiratory distress.     Breath sounds: No stridor. Wheezing present.  Skin:    General: Skin is warm and dry.  Neurological:     General: No focal deficit present.     Mental Status: She is alert and oriented to person, place, and time.  Psychiatric:        Mood and Affect: Mood normal.        Behavior: Behavior normal.     Procedures  Results for orders placed or performed during the hospital encounter of 10/23/24 (from the past 24 hours)  POC Influenza A/B     Status: None   Collection Time: 10/23/24 10:40 AM  Result Value Ref Range   Influenza A, POC Negative Negative  Influenza B, POC Negative Negative  POC SARS Coronavirus Ag     Status: None   Collection Time: 10/23/24 10:40 AM  Result Value Ref Range   SARS Coronavirus 2 Ag Negative Negative    Assessment and Plan :     Discharge Instructions       1. Acute upper respiratory infection (Primary) - POC Influenza A/B completed today in UC is negative for influenza - POC SARS Coronavirus Ag completed today in UC is negative for COVID - albuterol  (VENTOLIN  HFA) 108 (90 Base) MCG/ACT inhaler; Inhale 2 puffs into the lungs every 6 (six) hours as needed for wheezing or shortness of breath.  Dispense: 18 g; Refill: 3 - predniSONE  (DELTASONE ) 20 MG tablet; Take 2  tablets (40 mg total) by mouth daily for 5 days.  Dispense: 10 tablet; Refill: 0  2. Acute non-recurrent frontal sinusitis - azelastine  (ASTELIN ) 0.1 % nasal spray; Place 1 spray into both nostrils 2 (two) times daily. Use in each nostril as directed  Dispense: 30 mL; Refill: 0 - amoxicillin -clavulanate (AUGMENTIN ) 875-125 MG tablet; Take 1 tablet by mouth every 12 (twelve) hours.  Dispense: 14 tablet; Refill: 0  -Continue to monitor symptoms for any change in severity if there is any escalation of current symptoms or development of new symptoms follow-up in ER for further evaluation and management.      [name]    [1] No current facility-administered medications for this encounter.  Current Outpatient Medications:    amoxicillin -clavulanate (AUGMENTIN ) 875-125 MG tablet, Take 1 tablet by mouth every 12 (twelve) hours., Disp: 14 tablet, Rfl: 0   azelastine  (ASTELIN ) 0.1 % nasal spray, Place 1 spray into both nostrils 2 (two) times daily. Use in each nostril as directed, Disp: 30 mL, Rfl: 0   predniSONE  (DELTASONE ) 20 MG tablet, Take 2 tablets (40 mg total) by mouth daily for 5 days., Disp: 10 tablet, Rfl: 0   albuterol  (PROVENTIL ) (2.5 MG/3ML) 0.083% nebulizer solution, Take 3 mLs (2.5 mg total) by nebulization every 6 (six) hours as needed for wheezing or shortness of breath., Disp: 150 mL, Rfl: 1   albuterol  (VENTOLIN  HFA) 108 (90 Base) MCG/ACT inhaler, Inhale 2 puffs into the lungs every 6 (six) hours as needed for wheezing or shortness of breath., Disp: 18 g, Rfl: 3   amLODipine  (NORVASC ) 5 MG tablet, Take 1 tablet (5 mg total) by mouth daily., Disp: 90 tablet, Rfl: 3   Blood Pressure KIT, Use as instructed Dx: Hypertension, Disp: 1 kit, Rfl: 0   budesonide -formoterol  (SYMBICORT ) 160-4.5 MCG/ACT inhaler, Inhale 2 puffs into the lungs 2 (two) times daily., Disp: 10.2 g, Rfl: 6   lisinopril (ZESTRIL) 10 MG tablet, Take 10 mg by mouth daily., Disp: , Rfl:    Misc. Devices MISC,  Blood Pressure Monitor, Disp: 1 each, Rfl: 0   Misc. Devices MISC, Nebulizer device. Diagnosis - Asthma, Disp: 1 each, Rfl: 0   potassium chloride  SA (KLOR-CON  M) 20 MEQ tablet, Take 1 tablet (20 mEq total) by mouth daily., Disp: 3 tablet, Rfl: 0   SUMAtriptan  (IMITREX ) 50 MG tablet, Take 1 tablet (50 mg total) by mouth every 2 (two) hours as needed for migraine. May repeat in 2 hours if headache persists or recurs., Disp: 10 tablet, Rfl: 0 [2]  Allergies Allergen Reactions   Tramadol  Hives, Itching and Swelling    Pt states after medication intake she had to go to the ER.   [3]  Social History Tobacco Use   Smoking  status: Never   Smokeless tobacco: Never  Vaping Use   Vaping status: Never Used  Substance Use Topics   Alcohol use: No   Drug use: No     Aurea Goodell B, NP 10/23/24 1121  "

## 2024-10-23 NOTE — ED Triage Notes (Signed)
 Patient here today with c/o nasal congestion, weakness, fatigue, loss of appetite, and headache X 2 week. Patient has been taking OTC sinus pills and Robitussin with no relief. No known sick contacts. Patient has a h/o asthma.

## 2024-10-23 NOTE — Discharge Instructions (Addendum)
" °  1. Acute upper respiratory infection (Primary) - POC Influenza A/B completed today in UC is negative for influenza - POC SARS Coronavirus Ag completed today in UC is negative for COVID - albuterol  (VENTOLIN  HFA) 108 (90 Base) MCG/ACT inhaler; Inhale 2 puffs into the lungs every 6 (six) hours as needed for wheezing or shortness of breath.  Dispense: 18 g; Refill: 3 - predniSONE  (DELTASONE ) 20 MG tablet; Take 2 tablets (40 mg total) by mouth daily for 5 days.  Dispense: 10 tablet; Refill: 0  2. Acute non-recurrent frontal sinusitis - azelastine  (ASTELIN ) 0.1 % nasal spray; Place 1 spray into both nostrils 2 (two) times daily. Use in each nostril as directed  Dispense: 30 mL; Refill: 0 - amoxicillin -clavulanate (AUGMENTIN ) 875-125 MG tablet; Take 1 tablet by mouth every 12 (twelve) hours.  Dispense: 14 tablet; Refill: 0  -Continue to monitor symptoms for any change in severity if there is any escalation of current symptoms or development of new symptoms follow-up in ER for further evaluation and management. "

## 2024-10-24 ENCOUNTER — Ambulatory Visit: Payer: Self-pay

## 2024-11-26 ENCOUNTER — Ambulatory Visit: Payer: Self-pay | Admitting: Family Medicine
